# Patient Record
Sex: Male | Born: 1935
Health system: Southern US, Community
[De-identification: ages and names within clinical notes are randomized; demographics above are authoritative.]

## PROBLEM LIST (undated history)

## (undated) DIAGNOSIS — K8020 Calculus of gallbladder without cholecystitis without obstruction: Secondary | ICD-10-CM

## (undated) DIAGNOSIS — E785 Hyperlipidemia, unspecified: Secondary | ICD-10-CM

## (undated) DIAGNOSIS — IMO0002 Reserved for concepts with insufficient information to code with codable children: Secondary | ICD-10-CM

## (undated) DIAGNOSIS — E669 Obesity, unspecified: Secondary | ICD-10-CM

## (undated) DIAGNOSIS — N419 Inflammatory disease of prostate, unspecified: Secondary | ICD-10-CM

## (undated) DIAGNOSIS — E119 Type 2 diabetes mellitus without complications: Secondary | ICD-10-CM

## (undated) HISTORY — DX: Inflammatory disease of prostate, unspecified: N41.9

## (undated) HISTORY — DX: Type 2 diabetes mellitus without complications: E11.9

## (undated) HISTORY — DX: Hyperlipidemia, unspecified: E78.5

## (undated) HISTORY — PX: HERNIA REPAIR: SHX51

## (undated) HISTORY — DX: Reserved for concepts with insufficient information to code with codable children: IMO0002

## (undated) HISTORY — DX: Calculus of gallbladder without cholecystitis without obstruction: K80.20

## (undated) HISTORY — DX: Obesity, unspecified: E66.9

---

## 2000-09-28 ENCOUNTER — Encounter: Payer: Self-pay | Admitting: Family Medicine

## 2000-09-28 ENCOUNTER — Encounter: Admission: RE | Admit: 2000-09-28 | Discharge: 2000-09-28 | Payer: Self-pay | Admitting: Family Medicine

## 2005-01-04 ENCOUNTER — Ambulatory Visit: Payer: Self-pay | Admitting: Family Medicine

## 2005-01-28 ENCOUNTER — Ambulatory Visit: Payer: Self-pay | Admitting: Family Medicine

## 2005-01-31 ENCOUNTER — Encounter: Admission: RE | Admit: 2005-01-31 | Discharge: 2005-01-31 | Payer: Self-pay | Admitting: Family Medicine

## 2006-01-31 ENCOUNTER — Ambulatory Visit: Payer: Self-pay | Admitting: Family Medicine

## 2006-01-31 LAB — CONVERTED CEMR LAB
AST: 24 units/L (ref 0–37)
Albumin: 3.9 g/dL (ref 3.5–5.2)
Alkaline Phosphatase: 68 units/L (ref 39–117)
BUN: 9 mg/dL (ref 6–23)
Basophils Absolute: 0 10*3/uL (ref 0.0–0.1)
CO2: 31 meq/L (ref 19–32)
Calcium: 9.4 mg/dL (ref 8.4–10.5)
Chol/HDL Ratio, serum: 4.5
Cholesterol: 168 mg/dL (ref 0–200)
Creatinine, Ser: 0.9 mg/dL (ref 0.4–1.5)
GFR calc non Af Amer: 89 mL/min
Glomerular Filtration Rate, Af Am: 108 mL/min/{1.73_m2}
HCT: 46.3 % (ref 39.0–52.0)
Hemoglobin: 15.3 g/dL (ref 13.0–17.0)
Lymphocytes Relative: 32.5 % (ref 12.0–46.0)
Monocytes Absolute: 0.5 10*3/uL (ref 0.2–0.7)
Monocytes Relative: 8.2 % (ref 3.0–11.0)
Neutro Abs: 3.6 10*3/uL (ref 1.4–7.7)
RBC: 5.11 M/uL (ref 4.22–5.81)
Total Bilirubin: 1.7 mg/dL — ABNORMAL HIGH (ref 0.3–1.2)
Total Protein: 6.7 g/dL (ref 6.0–8.3)
Triglyceride fasting, serum: 136 mg/dL (ref 0–149)

## 2006-02-07 ENCOUNTER — Ambulatory Visit: Payer: Self-pay | Admitting: Family Medicine

## 2006-04-17 ENCOUNTER — Ambulatory Visit: Payer: Self-pay | Admitting: Family Medicine

## 2006-08-01 ENCOUNTER — Ambulatory Visit: Payer: Self-pay | Admitting: Family Medicine

## 2006-08-01 LAB — CONVERTED CEMR LAB
AST: 25 units/L (ref 0–37)
Bilirubin, Direct: 0.3 mg/dL (ref 0.0–0.3)
Chloride: 109 meq/L (ref 96–112)
Creatinine, Ser: 0.8 mg/dL (ref 0.4–1.5)
Glucose, Bld: 118 mg/dL — ABNORMAL HIGH (ref 70–99)
Sodium: 141 meq/L (ref 135–145)
Total Bilirubin: 1.6 mg/dL — ABNORMAL HIGH (ref 0.3–1.2)

## 2007-02-06 ENCOUNTER — Ambulatory Visit: Payer: Self-pay | Admitting: Family Medicine

## 2007-02-06 LAB — CONVERTED CEMR LAB
ALT: 21 units/L (ref 0–53)
AST: 20 units/L (ref 0–37)
Basophils Relative: 0.2 % (ref 0.0–1.0)
Bilirubin, Direct: 0.3 mg/dL (ref 0.0–0.3)
CO2: 29 meq/L (ref 19–32)
Calcium: 9.4 mg/dL (ref 8.4–10.5)
Chloride: 106 meq/L (ref 96–112)
Creatinine, Ser: 0.9 mg/dL (ref 0.4–1.5)
Eosinophils Absolute: 0.2 10*3/uL (ref 0.0–0.6)
Eosinophils Relative: 2.5 % (ref 0.0–5.0)
GFR calc non Af Amer: 89 mL/min
Glucose, Bld: 118 mg/dL — ABNORMAL HIGH (ref 70–99)
Glucose, Urine, Semiquant: NEGATIVE
HCT: 42.9 % (ref 39.0–52.0)
MCV: 88.7 fL (ref 78.0–100.0)
Neutrophils Relative %: 61.6 % (ref 43.0–77.0)
Nitrite: NEGATIVE
PSA: 4.43 ng/mL — ABNORMAL HIGH (ref 0.10–4.00)
RBC: 4.83 M/uL (ref 4.22–5.81)
Sodium: 140 meq/L (ref 135–145)
Specific Gravity, Urine: 1.015
Total Bilirubin: 1.5 mg/dL — ABNORMAL HIGH (ref 0.3–1.2)
Total CHOL/HDL Ratio: 4.9
Total Protein: 6.2 g/dL (ref 6.0–8.3)
VLDL: 18 mg/dL (ref 0–40)
WBC Urine, dipstick: NEGATIVE
WBC: 7.4 10*3/uL (ref 4.5–10.5)
pH: 8

## 2007-02-15 ENCOUNTER — Ambulatory Visit: Payer: Self-pay | Admitting: Family Medicine

## 2007-02-15 DIAGNOSIS — E785 Hyperlipidemia, unspecified: Secondary | ICD-10-CM | POA: Insufficient documentation

## 2007-02-15 DIAGNOSIS — K7689 Other specified diseases of liver: Secondary | ICD-10-CM

## 2007-02-22 ENCOUNTER — Ambulatory Visit: Payer: Self-pay | Admitting: Cardiology

## 2007-03-01 ENCOUNTER — Telehealth (INDEPENDENT_AMBULATORY_CARE_PROVIDER_SITE_OTHER): Payer: Self-pay | Admitting: *Deleted

## 2007-03-02 ENCOUNTER — Telehealth (INDEPENDENT_AMBULATORY_CARE_PROVIDER_SITE_OTHER): Payer: Self-pay | Admitting: *Deleted

## 2007-05-17 ENCOUNTER — Telehealth: Payer: Self-pay | Admitting: Family Medicine

## 2008-01-07 ENCOUNTER — Telehealth: Payer: Self-pay | Admitting: Family Medicine

## 2008-01-14 ENCOUNTER — Ambulatory Visit: Payer: Self-pay | Admitting: Family Medicine

## 2008-01-14 DIAGNOSIS — Z87448 Personal history of other diseases of urinary system: Secondary | ICD-10-CM | POA: Insufficient documentation

## 2008-01-14 LAB — CONVERTED CEMR LAB
Nitrite: NEGATIVE
Protein, U semiquant: NEGATIVE
Urobilinogen, UA: 0.2

## 2008-02-05 ENCOUNTER — Ambulatory Visit: Payer: Self-pay | Admitting: Family Medicine

## 2008-02-05 LAB — CONVERTED CEMR LAB
ALT: 25 units/L (ref 0–53)
AST: 23 units/L (ref 0–37)
Alkaline Phosphatase: 67 units/L (ref 39–117)
Basophils Absolute: 0 10*3/uL (ref 0.0–0.1)
Bilirubin, Direct: 0.1 mg/dL (ref 0.0–0.3)
Blood in Urine, dipstick: NEGATIVE
CO2: 30 meq/L (ref 19–32)
Chloride: 105 meq/L (ref 96–112)
Cholesterol: 130 mg/dL (ref 0–200)
Eosinophils Absolute: 0.2 10*3/uL (ref 0.0–0.7)
GFR calc non Af Amer: 102 mL/min
Glucose, Urine, Semiquant: NEGATIVE
HDL: 36.2 mg/dL — ABNORMAL LOW (ref >39.0)
LDL Cholesterol: 69 mg/dL (ref 0–99)
Lymphocytes Relative: 29.6 % (ref 12.0–46.0)
MCHC: 35 g/dL (ref 30.0–36.0)
MCV: 89.7 fL (ref 78.0–100.0)
Neutrophils Relative %: 56.9 % (ref 43.0–77.0)
Platelets: 209 10*3/uL (ref 150–400)
Potassium: 4.3 meq/L (ref 3.5–5.1)
Protein, U semiquant: NEGATIVE
RBC: 4.71 M/uL (ref 4.22–5.81)
Total Bilirubin: 1.4 mg/dL — ABNORMAL HIGH (ref 0.3–1.2)
Urobilinogen, UA: 0.2
VLDL: 25 mg/dL (ref 0–40)
WBC Urine, dipstick: NEGATIVE
WBC: 5.4 10*3/uL (ref 4.5–10.5)
pH: 6.5

## 2008-02-19 ENCOUNTER — Ambulatory Visit: Payer: Self-pay | Admitting: Family Medicine

## 2008-02-19 DIAGNOSIS — J309 Allergic rhinitis, unspecified: Secondary | ICD-10-CM | POA: Insufficient documentation

## 2008-02-19 DIAGNOSIS — F528 Other sexual dysfunction not due to a substance or known physiological condition: Secondary | ICD-10-CM

## 2009-02-23 ENCOUNTER — Ambulatory Visit: Payer: Self-pay | Admitting: Family Medicine

## 2009-02-23 LAB — CONVERTED CEMR LAB
BUN: 8 mg/dL (ref 6–23)
Bilirubin Urine: NEGATIVE
Bilirubin, Direct: 0.1 mg/dL (ref 0.0–0.3)
Blood in Urine, dipstick: NEGATIVE
Calcium: 9.1 mg/dL (ref 8.4–10.5)
Chloride: 108 meq/L (ref 96–112)
Cholesterol: 156 mg/dL (ref 0–200)
Creatinine, Ser: 0.7 mg/dL (ref 0.4–1.5)
Eosinophils Absolute: 0.2 10*3/uL (ref 0.0–0.7)
Eosinophils Relative: 2.7 % (ref 0.0–5.0)
HDL: 38.1 mg/dL — ABNORMAL LOW (ref >39.00)
Ketones, urine, test strip: NEGATIVE
LDL Cholesterol: 93 mg/dL (ref 0–99)
Lymphocytes Relative: 30.2 % (ref 12.0–46.0)
MCHC: 35 g/dL (ref 30.0–36.0)
MCV: 91.3 fL (ref 78.0–100.0)
Monocytes Absolute: 0.5 10*3/uL (ref 0.1–1.0)
Neutrophils Relative %: 57.2 % (ref 43.0–77.0)
Nitrite: NEGATIVE
PSA: 1.49 ng/mL (ref 0.10–4.00)
Platelets: 201 10*3/uL (ref 150.0–400.0)
Protein, U semiquant: NEGATIVE
Total Bilirubin: 1.6 mg/dL — ABNORMAL HIGH (ref 0.3–1.2)
Triglycerides: 124 mg/dL (ref 0.0–149.0)
Urobilinogen, UA: 0.2
VLDL: 24.8 mg/dL (ref 0.0–40.0)
WBC: 5.9 10*3/uL (ref 4.5–10.5)

## 2009-03-03 ENCOUNTER — Ambulatory Visit: Payer: Self-pay | Admitting: Family Medicine

## 2010-03-29 ENCOUNTER — Ambulatory Visit
Admission: RE | Admit: 2010-03-29 | Discharge: 2010-03-29 | Payer: Self-pay | Source: Home / Self Care | Attending: Family Medicine | Admitting: Family Medicine

## 2010-03-29 ENCOUNTER — Other Ambulatory Visit: Payer: Self-pay | Admitting: Family Medicine

## 2010-03-29 LAB — CBC WITH DIFFERENTIAL/PLATELET
Basophils Absolute: 0 10*3/uL (ref 0.0–0.1)
Basophils Relative: 0 % (ref 0.0–3.0)
Eosinophils Absolute: 0.2 10*3/uL (ref 0.0–0.7)
Eosinophils Relative: 3 % (ref 0.0–5.0)
HCT: 44.2 % (ref 39.0–52.0)
Hemoglobin: 14.9 g/dL (ref 13.0–17.0)
Lymphocytes Relative: 26 % (ref 12.0–46.0)
Lymphs Abs: 1.8 10*3/uL (ref 0.7–4.0)
MCHC: 33.6 g/dL (ref 30.0–36.0)
MCV: 91.4 fl (ref 78.0–100.0)
Monocytes Absolute: 0.7 10*3/uL (ref 0.1–1.0)
Monocytes Relative: 9.7 % (ref 3.0–12.0)
Neutro Abs: 4.3 10*3/uL (ref 1.4–7.7)
Neutrophils Relative %: 61.3 % (ref 43.0–77.0)
Platelets: 231 10*3/uL (ref 150.0–400.0)
RBC: 4.84 Mil/uL (ref 4.22–5.81)
RDW: 14 % (ref 11.5–14.6)
WBC: 7.1 10*3/uL (ref 4.5–10.5)

## 2010-03-29 LAB — LIPID PANEL
Cholesterol: 157 mg/dL (ref 0–200)
HDL: 38.6 mg/dL — ABNORMAL LOW (ref >39.00)
LDL Cholesterol: 95 mg/dL (ref 0–99)
Total CHOL/HDL Ratio: 4
Triglycerides: 116 mg/dL (ref 0.0–149.0)
VLDL: 23.2 mg/dL (ref 0.0–40.0)

## 2010-03-29 LAB — CONVERTED CEMR LAB
Bilirubin Urine: NEGATIVE
Blood in Urine, dipstick: NEGATIVE
Glucose, Urine, Semiquant: NEGATIVE
Protein, U semiquant: NEGATIVE
Specific Gravity, Urine: >=1.03
WBC Urine, dipstick: NEGATIVE
pH: 5.5

## 2010-03-29 LAB — BASIC METABOLIC PANEL
BUN: 10 mg/dL (ref 6–23)
CO2: 26 mEq/L (ref 19–32)
Calcium: 8.9 mg/dL (ref 8.4–10.5)
Chloride: 102 mEq/L (ref 96–112)
Creatinine, Ser: 0.8 mg/dL (ref 0.4–1.5)
GFR: 95.2 mL/min (ref >60.00)
Glucose, Bld: 133 mg/dL — ABNORMAL HIGH (ref 70–99)
Potassium: 4.1 mEq/L (ref 3.5–5.1)
Sodium: 136 mEq/L (ref 135–145)

## 2010-03-29 LAB — TSH: TSH: 0.47 u[IU]/mL (ref 0.35–5.50)

## 2010-03-29 LAB — HEPATIC FUNCTION PANEL
ALT: 25 U/L (ref 0–53)
AST: 22 U/L (ref 0–37)
Albumin: 3.8 g/dL (ref 3.5–5.2)
Alkaline Phosphatase: 79 U/L (ref 39–117)
Bilirubin, Direct: 0.1 mg/dL (ref 0.0–0.3)
Total Bilirubin: 0.9 mg/dL (ref 0.3–1.2)
Total Protein: 6.3 g/dL (ref 6.0–8.3)

## 2010-03-29 LAB — PSA: PSA: 2.21 ng/mL (ref 0.10–4.00)

## 2010-04-02 ENCOUNTER — Ambulatory Visit
Admission: RE | Admit: 2010-04-02 | Discharge: 2010-04-02 | Payer: Self-pay | Source: Home / Self Care | Attending: Family Medicine | Admitting: Family Medicine

## 2010-04-02 ENCOUNTER — Encounter: Payer: Self-pay | Admitting: Family Medicine

## 2010-04-15 NOTE — Assessment & Plan Note (Signed)
Summary: CPX//SLM   Vital Signs:  Patient profile:   75 year old male Height:      67.5 inches Weight:      254 pounds BMI:     39.34 Temp:     98.6 degrees F oral BP sitting:   110 / 80  (left arm) Cuff size:   regular  Vitals Entered By: Kern Reap CMA Duncan Dull) (April 02, 2010 2:31 PM) CC: wellness exam Is Patient Diabetic? No   CC:  wellness exam.  History of Present Illness: Erik Lara is a 75 year old, married male, who comes in today for a Medicare wellness exam.  Because of underlying hyperlipidemia, allergic rhinitis, and erectile dysfunction.  He takes Zocor 20 mg daily for hyperlipidemia.  Lipids are at goal continue above therapy.  He also takes OTC Zyrtec for allergic rhinitis and vibrant 50 mg p.r.n. for ED, and one adult aspirin tablet daily, and tetanus 2005, seasonal flu 2011, Pneumovax 2007 information given on shingles.  History T9.  Care, dental care, colonoscopy, normal, and GI Here for Medicare AWV:  1.   Risk factors based on Past M, S, F history:.....reviewed no changes 2.   Physical Activities: .Marland Kitchen..walks daily. 3.   Depression/mood: Good mood.  No depression 4.   Hearing: normal 5.   ADL's: functions independently 6.   Fall Risk: reviewed and identified 7.   Home Safety: no guns in the house 8.   Height, weight, &visual acuity:height weight, vision.  No 9.   Counseling: continue good health habits 10.   Labs ordered based on risk factors: reviewed all laboratory data 11.           Referral Coordination...none indicated 12.           Care Plan...Marland KitchenMarland KitchenMarland Kitchenreviewed medications 13.            Cognitive Assessment .Marland Kitchen...oriented x 3 financially independent  Allergies: No Known Drug Allergies  Past History:  Past medical, surgical, family and social histories (including risk factors) reviewed, and no changes noted (except as noted below).  Past Medical History: Reviewed history from 02/19/2008 and no changes required. Hyperlipidemia liver  cysts asymptomatic gallstones obesity prostatitis requiring a trip to the emergency room in [facility] with high fever 09  Family History: Reviewed history from 02/15/2007 and no changes required. Family History Breast cancer 1st degree relative <50 Family History Diabetes 1st degree relative  Social History: Reviewed history from 02/15/2007 and no changes required. Retired Married Never Smoked Alcohol use-no Drug use-no Regular exercise-yes  Review of Systems      See HPI  Physical Exam  General:  Well-developed,well-nourished,in no acute distress; alert,appropriate and cooperative throughout examination Head:  Normocephalic and atraumatic without obvious abnormalities. No apparent alopecia or balding. Eyes:  No corneal or conjunctival inflammation noted. EOMI. Perrla. Funduscopic exam benign, without hemorrhages, exudates or papilledema. Vision grossly normal. Ears:  External ear exam shows no significant lesions or deformities.  Otoscopic examination reveals clear canals, tympanic membranes are intact bilaterally without bulging, retraction, inflammation or discharge. Hearing is grossly normal bilaterally. Nose:  External nasal examination shows no deformity or inflammation. Nasal mucosa are pink and moist without lesions or exudates. Mouth:  Oral mucosa and oropharynx without lesions or exudates.  Teeth in good repair. Neck:  No deformities, masses, or tenderness noted. Breasts:  No masses or gynecomastia noted Lungs:  Normal respiratory effort, chest expands symmetrically. Lungs are clear to auscultation, no crackles or wheezes. Heart:  Normal rate and regular rhythm. S1 and  S2 normal without gallop, murmur, click, rub or other extra sounds. Abdomen:  Bowel sounds positive,abdomen soft and non-tender without masses, organomegaly or hernias noted. Rectal:  No external abnormalities noted. Normal sphincter tone. No rectal masses or tenderness. Genitalia:  Testes bilaterally  descended without nodularity, tenderness or masses. No scrotal masses or lesions. No penis lesions or urethral discharge. Prostate:  Prostate gland firm and smooth, no enlargement, nodularity, tenderness, mass, asymmetry or induration. Msk:  No deformity or scoliosis noted of thoracic or lumbar spine.   Pulses:  R and L carotid,radial,femoral,dorsalis pedis and posterior tibial pulses are full and equal bilaterally Extremities:  No clubbing, cyanosis, edema, or deformity noted with normal full range of motion of all joints.   Neurologic:  No cranial nerve deficits noted. Station and gait are normal. Plantar reflexes are down-going bilaterally. DTRs are symmetrical throughout. Sensory, motor and coordinative functions appear intact. Skin:  Intact without suspicious lesions or rashes Cervical Nodes:  No lymphadenopathy noted Axillary Nodes:  No palpable lymphadenopathy Inguinal Nodes:  No significant adenopathy Psych:  Cognition and judgment appear intact. Alert and cooperative with normal attention span and concentration. No apparent delusions, illusions, hallucinations   Impression & Recommendations:  Problem # 1:  ERECTILE DYSFUNCTION, MILD (ICD-302.72) Assessment Improved  The following medications were removed from the medication list:    Viagra 50 Mg Tabs (Sildenafil citrate) .Marland Kitchen... 1/2 tab as needed His updated medication list for this problem includes:    Viagra 100 Mg Tabs (Sildenafil citrate) ..... Uad  Orders: Prescription Created Electronically [phone]) Medicare -1st Annual Wellness Visit [id number])  Problem # 2:  HYPERLIPIDEMIA (ICD-272.4) Assessment: Improved  His updated medication list for this problem includes:    Zocor 20 Mg Tabs (Simvastatin) .Marland Kitchen... Take 1 tablet by mouth at bedtime; cpx when due  Orders: Prescription Created Electronically [phone]) Medicare -1st Annual Wellness Visit [phone]) EKG w/ Interpretation (93000)  Problem # 3:  HEALTH SCREENING  (ICD-V70.0) Assessment: Unchanged  Orders: Prescription Created Electronically [phone]) Medicare -1st Annual Wellness Visit [phone]) EKG w/ Interpretation (93000)  Problem # 4:  HEPATIC CYST (ICD-573.8) Assessment: Unchanged  Orders: Prescription Created Electronically [phone]) Medicare -1st Annual Wellness Visit [phone])  Complete Medication List: 1)  Zocor 20 Mg Tabs (Simvastatin) .... Take 1 tablet by mouth at bedtime; cpx when due 2)  Zyrtec Allergy 10 Mg Tabs (Cetirizine hcl) .... Once daily at bedtime 3)  Dark Choc With Almonds  4)  Adult Aspirin Ec Low Strength 81 Mg Tbec (Aspirin) .... Once daily 5)  Viagra 100 Mg Tabs (Sildenafil citrate) .... Uad  Patient Instructions: 1)  split the 100 milligram Viagra tablet and take a half a tablet two hours prior to sex with water 2)  Please schedule a follow-up appointment in 1 year. 3)  It is important that you exercise regularly at least 20 minutes 5 times a week. If you develop chest pain, have severe difficulty breathing, or feel very tired , stop exercising immediately and seek medical attention. 4)  You need to lose weight. Consider a lower calorie diet and regular exercise.  5)  Take an Aspirin every day. Prescriptions: ZOCOR 20 MG  TABS (SIMVASTATIN) Take 1 tablet by mouth at bedtime; cpx when due  #100 x 3   Entered and Authorized by:   Roderick Pee MD   Signed by:   Roderick Pee MD on 04/02/2010   Method used:   Electronically to        [facility] (retail)       [address]       [place], [place]  [address]       Ph: [phone]       Fax: [phone]   RxID:   [id number] VIAGRA 100 MG TABS (SILDENAFIL CITRATE) UAD  #6 x 11   Entered and Authorized by:   Roderick Pee MD   Signed by:   Roderick Pee MD on 04/02/2010   Method used:   Electronically to        [facility] [place] (retail)       [address]       [place], [place]  [address]       Ph: [phone]       Fax: [phone]   RxID:   [id number]    Orders Added: 1)  Prescription Created Electronically [phone] 2)  Medicare -1st Annual Wellness Visit [G0438] 3)  EKG w/ Interpretation [93000]   Immunization History:  Influenza Immunization History:    Influenza:  historical (12/12/2009)   Immunization History:  Influenza Immunization History:    Influenza:  Historical (12/12/2009)

## 2011-02-25 ENCOUNTER — Other Ambulatory Visit: Payer: Self-pay

## 2011-02-25 MED ORDER — SIMVASTATIN 20 MG PO TABS: 20.0000 mg | ORAL_TABLET | Freq: Every day | ORAL | Status: DC

## 2011-08-02 ENCOUNTER — Other Ambulatory Visit (INDEPENDENT_AMBULATORY_CARE_PROVIDER_SITE_OTHER): Payer: Medicare Other

## 2011-08-02 DIAGNOSIS — E785 Hyperlipidemia, unspecified: Secondary | ICD-10-CM

## 2011-08-02 DIAGNOSIS — Z125 Encounter for screening for malignant neoplasm of prostate: Secondary | ICD-10-CM

## 2011-08-02 DIAGNOSIS — Z0000 Encounter for general adult medical examination without abnormal findings: Secondary | ICD-10-CM

## 2011-08-02 LAB — LIPID PANEL
HDL: 34.7 mg/dL — ABNORMAL LOW (ref >39.00)
Total CHOL/HDL Ratio: 4

## 2011-08-02 LAB — PSA: PSA: 2.13 ng/mL (ref 0.10–4.00)

## 2011-08-02 LAB — BASIC METABOLIC PANEL
CO2: 28 mEq/L (ref 19–32)
Calcium: 9.2 mg/dL (ref 8.4–10.5)
Creatinine, Ser: 0.7 mg/dL (ref 0.4–1.5)
Sodium: 141 mEq/L (ref 135–145)

## 2011-08-02 LAB — CBC WITH DIFFERENTIAL/PLATELET
Basophils Relative: 0.5 % (ref 0.0–3.0)
Eosinophils Absolute: 0.2 10*3/uL (ref 0.0–0.7)
Eosinophils Relative: 2.3 % (ref 0.0–5.0)
Hemoglobin: 15 g/dL (ref 13.0–17.0)
Lymphocytes Relative: 22.8 % (ref 12.0–46.0)
MCHC: 33.2 g/dL (ref 30.0–36.0)
Monocytes Relative: 9.6 % (ref 3.0–12.0)
Neutro Abs: 4.5 10*3/uL (ref 1.4–7.7)
Neutrophils Relative %: 64.8 % (ref 43.0–77.0)
RBC: 4.99 Mil/uL (ref 4.22–5.81)
WBC: 6.9 10*3/uL (ref 4.5–10.5)

## 2011-08-02 LAB — HEPATIC FUNCTION PANEL
ALT: 21 U/L (ref 0–53)
AST: 19 U/L (ref 0–37)
Albumin: 3.8 g/dL (ref 3.5–5.2)
Alkaline Phosphatase: 65 U/L (ref 39–117)
Bilirubin, Direct: 0.2 mg/dL (ref 0.0–0.3)
Total Protein: 6.4 g/dL (ref 6.0–8.3)

## 2011-08-02 LAB — POCT URINALYSIS DIPSTICK
Blood, UA: NEGATIVE
Ketones, UA: NEGATIVE
Protein, UA: NEGATIVE
Spec Grav, UA: 1.015
Urobilinogen, UA: 0.2

## 2011-08-06 ENCOUNTER — Encounter: Payer: Self-pay | Admitting: Family Medicine

## 2011-08-09 ENCOUNTER — Ambulatory Visit (INDEPENDENT_AMBULATORY_CARE_PROVIDER_SITE_OTHER): Payer: Medicare Other | Admitting: Family Medicine

## 2011-08-09 ENCOUNTER — Encounter: Payer: Self-pay | Admitting: Family Medicine

## 2011-08-09 VITALS — BP 128/88 | Temp 98.4°F | Ht 71.75 in | Wt 252.0 lb

## 2011-08-09 DIAGNOSIS — E785 Hyperlipidemia, unspecified: Secondary | ICD-10-CM

## 2011-08-09 DIAGNOSIS — K7689 Other specified diseases of liver: Secondary | ICD-10-CM

## 2011-08-09 DIAGNOSIS — Z0000 Encounter for general adult medical examination without abnormal findings: Secondary | ICD-10-CM

## 2011-08-09 MED ORDER — SIMVASTATIN 20 MG PO TABS: 20.0000 mg | ORAL_TABLET | Freq: Every day | ORAL | Status: DC

## 2011-08-09 NOTE — Progress Notes (Signed)
  Subjective:    Patient ID: Erik Lara, male    DOB: 1935/05/24, 76 y.o.   MRN: [id number]  HPI Erik Lara is a 76 year old married male nonsmoker who comes in today for a Medicare wellness examination because of a history of allergic rhinitis and hyperlipidemia  He takes over-the-counter Zyrtec for allergic rhinitis  He takes Zocor 20 mg and an aspirin tablet daily for hyperlipidemia lipids are at goal with an LDL of 88  He gets routine eye care, dental care, screening colonoscopies, tetanus 2005, Pneumovax x2, information given on shingles  Cognitive function normal he walks on a regular basis home health safety reviewed no issues identified, no guns in the house, he does have a health care power of attorney and living will   Review of Systems  Constitutional: Negative.   HENT: Negative.   Eyes: Negative.   Respiratory: Negative.   Cardiovascular: Negative.   Gastrointestinal: Negative.   Genitourinary: Negative.   Musculoskeletal: Negative.   Skin: Negative.   Neurological: Negative.   Hematological: Negative.   Psychiatric/Behavioral: Negative.        Objective:   Physical Exam  Constitutional: He is oriented to person, place, and time. He appears well-developed and well-nourished.  HENT:  Head: Normocephalic and atraumatic.  Right Ear: External ear normal.  Left Ear: External ear normal.  Nose: Nose normal.  Mouth/Throat: Oropharynx is clear and moist.  Eyes: Conjunctivae and EOM are normal. Pupils are equal, round, and reactive to light.  Neck: Normal range of motion. Neck supple. No JVD present. No tracheal deviation present. No thyromegaly present.  Cardiovascular: Normal rate, regular rhythm, normal heart sounds and intact distal pulses.  Exam reveals no gallop and no friction rub.   No murmur heard. Pulmonary/Chest: Effort normal and breath sounds normal. No stridor. No respiratory distress. He has no wheezes. He has no rales. He exhibits no tenderness.    Abdominal: Soft. Bowel sounds are normal. He exhibits mass. He exhibits no distension. There is no tenderness. There is no rebound and no guarding.       Cystic lesion persists right lobe of the liver unchanged in size  Genitourinary: Rectum normal, prostate normal and penis normal. Guaiac negative stool. No penile tenderness.  Musculoskeletal: Normal range of motion. He exhibits no edema and no tenderness.  Lymphadenopathy:    He has no cervical adenopathy.  Neurological: He is alert and oriented to person, place, and time. He has normal reflexes. No cranial nerve deficit. He exhibits normal muscle tone.  Skin: Skin is warm and dry. No rash noted. No erythema. No pallor.  Psychiatric: He has a normal mood and affect. His behavior is normal. Judgment and thought content normal.          Assessment & Plan:  Healthy male  Hyperlipidemia continue Zocor and aspirin  Allergic rhinitis continue Zyrtec  Hepatic cysts continued observation

## 2011-08-09 NOTE — Patient Instructions (Signed)
Continue your current medications  Followup in 1 year sooner if any problems 

## 2012-07-31 ENCOUNTER — Other Ambulatory Visit: Payer: Self-pay | Admitting: Family Medicine

## 2012-10-31 ENCOUNTER — Other Ambulatory Visit: Payer: Self-pay | Admitting: Family Medicine

## 2013-01-23 ENCOUNTER — Other Ambulatory Visit (INDEPENDENT_AMBULATORY_CARE_PROVIDER_SITE_OTHER): Payer: Medicare Other

## 2013-01-23 DIAGNOSIS — E119 Type 2 diabetes mellitus without complications: Secondary | ICD-10-CM

## 2013-01-23 DIAGNOSIS — E785 Hyperlipidemia, unspecified: Secondary | ICD-10-CM

## 2013-01-23 DIAGNOSIS — Z0000 Encounter for general adult medical examination without abnormal findings: Secondary | ICD-10-CM

## 2013-01-23 LAB — POCT URINALYSIS DIPSTICK
Leukocytes, UA: NEGATIVE
Nitrite, UA: NEGATIVE
Protein, UA: NEGATIVE
Urobilinogen, UA: 0.2
pH, UA: 6.5

## 2013-01-23 LAB — CBC WITH DIFFERENTIAL/PLATELET
Basophils Relative: 0.3 % (ref 0.0–3.0)
HCT: 43.3 % (ref 39.0–52.0)
Hemoglobin: 14.9 g/dL (ref 13.0–17.0)
Lymphocytes Relative: 24.1 % (ref 12.0–46.0)
Lymphs Abs: 1.6 10*3/uL (ref 0.7–4.0)
Monocytes Relative: 8.1 % (ref 3.0–12.0)
Neutro Abs: 4.3 10*3/uL (ref 1.4–7.7)
RBC: 4.98 Mil/uL (ref 4.22–5.81)
RDW: 13.6 % (ref 11.5–14.6)

## 2013-01-23 LAB — LIPID PANEL
HDL: 39.6 mg/dL (ref >39.00)
LDL Cholesterol: 92 mg/dL (ref 0–99)
Total CHOL/HDL Ratio: 4
VLDL: 25.2 mg/dL (ref 0.0–40.0)

## 2013-01-23 LAB — BASIC METABOLIC PANEL
CO2: 28 mEq/L (ref 19–32)
Calcium: 9.3 mg/dL (ref 8.4–10.5)
GFR: 122.64 mL/min (ref >60.00)
Glucose, Bld: 136 mg/dL — ABNORMAL HIGH (ref 70–99)
Potassium: 4.5 mEq/L (ref 3.5–5.1)
Sodium: 136 mEq/L (ref 135–145)

## 2013-01-23 LAB — TSH: TSH: 0.29 u[IU]/mL — ABNORMAL LOW (ref 0.35–5.50)

## 2013-01-23 LAB — HEPATIC FUNCTION PANEL
ALT: 23 U/L (ref 0–53)
AST: 22 U/L (ref 0–37)
Albumin: 3.9 g/dL (ref 3.5–5.2)
Total Bilirubin: 1.5 mg/dL — ABNORMAL HIGH (ref 0.3–1.2)
Total Protein: 6.7 g/dL (ref 6.0–8.3)

## 2013-01-25 ENCOUNTER — Ambulatory Visit: Payer: Medicare Other

## 2013-01-25 DIAGNOSIS — R7309 Other abnormal glucose: Secondary | ICD-10-CM

## 2013-01-25 LAB — HEMOGLOBIN A1C: Hgb A1c MFr Bld: 7.3 % — ABNORMAL HIGH (ref 4.6–6.5)

## 2013-01-28 ENCOUNTER — Telehealth: Payer: Self-pay | Admitting: Family Medicine

## 2013-01-28 ENCOUNTER — Ambulatory Visit (INDEPENDENT_AMBULATORY_CARE_PROVIDER_SITE_OTHER): Payer: Medicare Other | Admitting: Family Medicine

## 2013-01-28 ENCOUNTER — Encounter: Payer: Self-pay | Admitting: Family Medicine

## 2013-01-28 VITALS — BP 120/80 | Temp 98.2°F | Ht 72.0 in | Wt 250.0 lb

## 2013-01-28 DIAGNOSIS — E119 Type 2 diabetes mellitus without complications: Secondary | ICD-10-CM

## 2013-01-28 DIAGNOSIS — E785 Hyperlipidemia, unspecified: Secondary | ICD-10-CM

## 2013-01-28 DIAGNOSIS — J309 Allergic rhinitis, unspecified: Secondary | ICD-10-CM

## 2013-01-28 DIAGNOSIS — R7309 Other abnormal glucose: Secondary | ICD-10-CM

## 2013-01-28 DIAGNOSIS — F528 Other sexual dysfunction not due to a substance or known physiological condition: Secondary | ICD-10-CM

## 2013-01-28 DIAGNOSIS — Z8639 Personal history of other endocrine, nutritional and metabolic disease: Secondary | ICD-10-CM

## 2013-01-28 DIAGNOSIS — R739 Hyperglycemia, unspecified: Secondary | ICD-10-CM

## 2013-01-28 DIAGNOSIS — Z0000 Encounter for general adult medical examination without abnormal findings: Secondary | ICD-10-CM

## 2013-01-28 DIAGNOSIS — Z862 Personal history of diseases of the blood and blood-forming organs and certain disorders involving the immune mechanism: Secondary | ICD-10-CM

## 2013-01-28 DIAGNOSIS — E0590 Thyrotoxicosis, unspecified without thyrotoxic crisis or storm: Secondary | ICD-10-CM

## 2013-01-28 HISTORY — DX: Thyrotoxicosis, unspecified without thyrotoxic crisis or storm: E05.90

## 2013-01-28 MED ORDER — METFORMIN HCL 500 MG PO TABS: 500.0000 mg | ORAL_TABLET | Freq: Two times a day (BID) | ORAL | Status: DC

## 2013-01-28 MED ORDER — SIMVASTATIN 20 MG PO TABS: ORAL_TABLET | ORAL | Status: DC

## 2013-01-28 NOTE — Telephone Encounter (Signed)
Pt needs a call back, he is confused about how to use his glucose device that was given to him today.

## 2013-01-28 NOTE — Progress Notes (Signed)
Pre visit review using our clinic review tool, if applicable. No additional management support is needed unless otherwise documented below in the visit note. 

## 2013-01-28 NOTE — Patient Instructions (Signed)
Metformin 500 mg tablet....... one half tab every morning before breakfast  Check your blood sugar once a day in the morning  Walk 30 minutes daily the days you do not do your workouts at the [facility]  Diet as we discussed  Followup in one month  Special labs today to check your thyroid status

## 2013-01-28 NOTE — Progress Notes (Signed)
  Subjective:    Patient ID: Erik Lara, male    DOB: [birth year], 77 y.o.   MRN: [id number]  HPI Erik Lara is a 77 year old married male nonsmoker who comes in today for a Medicare wellness examination  He has a history of hyperlipidemia and takes simvastatin 20 mg daily along with an aspirin tablet  His weight is stable at 250 pounds. He's had some elevated blood sugars in the past but his A1c is the normal. Now his fasting sugars 136 A1c 7.3 %......... asymptomatic  He gets routine eye care, dental care, colonoscopy and GI, 6 vaccinations up-to-date except he is due to shingles vaccine  Cognitive function normal he is exercising 4 times daily home health safety reviewed no issues identified, no guns in the house, he does have a health care power of attorney and living well  He's also had borderline hypothyroidism. Recent TSH is 0.29 we'll look into that further  He does have some bilateral high frequency hearing loss  Review of Systems  Constitutional: Negative.   HENT: Negative.   Eyes: Negative.   Respiratory: Negative.   Cardiovascular: Negative.   Gastrointestinal: Negative.   Endocrine: Negative.   Genitourinary: Negative.   Musculoskeletal: Negative.   Skin: Negative.   Allergic/Immunologic: Negative.   Neurological: Negative.   Hematological: Negative.   Psychiatric/Behavioral: Negative.        Objective:   Physical Exam  Nursing note and vitals reviewed. Constitutional: He is oriented to person, place, and time. He appears well-developed and well-nourished.  HENT:  Head: Normocephalic and atraumatic.  Right Ear: External ear normal.  Left Ear: External ear normal.  Nose: Nose normal.  Mouth/Throat: Oropharynx is clear and moist.  Eyes: Conjunctivae and EOM are normal. Pupils are equal, round, and reactive to light.  Neck: Normal range of motion. Neck supple. No JVD present. No tracheal deviation present. No thyromegaly present.  Cardiovascular: Normal  rate, regular rhythm, normal heart sounds and intact distal pulses.  Exam reveals no gallop and no friction rub.   No murmur heard. No carotid nor aortic bruits peripheral pulses 2+ and symmetrical  Pulmonary/Chest: Effort normal and breath sounds normal. No stridor. No respiratory distress. He has no wheezes. He has no rales. He exhibits no tenderness.  Abdominal: Soft. Bowel sounds are normal. He exhibits no distension and no mass. There is no tenderness. There is no rebound and no guarding.  Genitourinary: Rectum normal, prostate normal and penis normal. Guaiac negative stool. No penile tenderness.  Musculoskeletal: Normal range of motion. He exhibits no edema and no tenderness.  No neuropathy  Lymphadenopathy:    He has no cervical adenopathy.  Neurological: He is alert and oriented to person, place, and time. He has normal reflexes. No cranial nerve deficit. He exhibits normal muscle tone.  Skin: Skin is warm and dry. No rash noted. No erythema. No pallor.  Psychiatric: He has a normal mood and affect. His behavior is normal. Judgment and thought content normal.          Assessment & Plan:   healthy male  New-onset diabetes type 2,,,,,,, diet exercise metformin 250 mg daily followup in one month  Hyperlipidemia continue Zocor 20 mg daily and an aspirin tablet  Low TSH check labs

## 2013-01-29 NOTE — Telephone Encounter (Signed)
Attempted to call patient but line was busy.

## 2013-01-29 NOTE — Telephone Encounter (Signed)
Spoke with patient and clarified medication directions.  Patient also states that prodigy test strips would be the cheapest but he wants to talk to his insurance company before getting a Rx.

## 2013-01-31 ENCOUNTER — Telehealth: Payer: Self-pay | Admitting: Family Medicine

## 2013-01-31 NOTE — Telephone Encounter (Signed)
Reading for BS was 22 after breakfast. Advised to recheck in 30 min. Pt states it was 147.  Pt feels like he is not well trained in the use of this machine,  Pt is going to go w/ the free style light or prodigy machine. Pt is to discuss w/ pharm which one is the less expensive strips. Will let you know by end of day.

## 2013-02-01 MED ORDER — FREESTYLE LANCETS MISC: Status: DC

## 2013-02-01 MED ORDER — GLUCOSE BLOOD VI STRP: ORAL_STRIP | Status: DC

## 2013-02-01 NOTE — Telephone Encounter (Signed)
Left message on machine for patient.  Rx sent to [facility] and new glucometer ready for pick up

## 2013-02-01 NOTE — Telephone Encounter (Signed)
Pt needs [facility] to order test trips for FreeStyle light. Please advise, pt called again.

## 2013-02-01 NOTE — Telephone Encounter (Signed)
Pt try to call this afternoon w/ an update on the strips he will use.

## 2013-02-05 ENCOUNTER — Other Ambulatory Visit: Payer: Medicare Other

## 2013-02-12 ENCOUNTER — Encounter: Payer: Medicare Other | Admitting: Family Medicine

## 2013-02-14 ENCOUNTER — Other Ambulatory Visit: Payer: Self-pay | Admitting: *Deleted

## 2013-02-14 ENCOUNTER — Telehealth: Payer: Self-pay | Admitting: Family Medicine

## 2013-02-14 MED ORDER — GLUCOSE BLOOD VI STRP: ORAL_STRIP | Status: DC

## 2013-02-14 NOTE — Telephone Encounter (Signed)
Pt needs to get his glucose blood (FREESTYLE LITE) test strip filled by [facility]. Pt is having issues w/ the pharm getting this approved. [facility] [address]  [facility] is to have faxed [place] a request to fill the test strips.   Pt would to know if we could send a new rx to [facility]. The first one was sent to [facility], but they could not fill for free under pt's insurance. Pt has not been able to check his BS in over a week.

## 2013-02-14 NOTE — Telephone Encounter (Signed)
Sent in to requested pharmacy and informed pt

## 2013-02-18 ENCOUNTER — Telehealth: Payer: Self-pay | Admitting: Family Medicine

## 2013-02-18 NOTE — Telephone Encounter (Signed)
Pt following up on additional info needed for lancets and test strips that needs to be faxed to [facility]/ pharm.

## 2013-02-19 NOTE — Telephone Encounter (Signed)
New information faxed to [facility]

## 2013-02-20 ENCOUNTER — Telehealth: Payer: Self-pay | Admitting: Family Medicine

## 2013-02-20 NOTE — Telephone Encounter (Signed)
Spoke with pharmacy

## 2013-02-20 NOTE — Telephone Encounter (Signed)
Pharm could not read the bar code from the rx for lancets we faxed to them . verbal ok . They will not fill until they get this. [phone] ask for  [facility]

## 2013-03-11 ENCOUNTER — Ambulatory Visit: Payer: Medicare Other | Admitting: Family Medicine

## 2013-03-12 ENCOUNTER — Encounter: Payer: Self-pay | Admitting: Family Medicine

## 2013-03-12 ENCOUNTER — Ambulatory Visit (INDEPENDENT_AMBULATORY_CARE_PROVIDER_SITE_OTHER): Payer: Medicare Other | Admitting: Family Medicine

## 2013-03-12 VITALS — BP 140/84 | Temp 98.3°F | Wt 240.0 lb

## 2013-03-12 DIAGNOSIS — E119 Type 2 diabetes mellitus without complications: Secondary | ICD-10-CM

## 2013-03-12 NOTE — Patient Instructions (Signed)
Continue your current medications metformin......... one half tablet daily in the morning before breakfast  Walk 30 minutes daily  Check a fasting blood sugar Monday Wednesday Friday  Return in 3 months for followup  Labs one week prior

## 2013-03-12 NOTE — Progress Notes (Signed)
   Subjective:    Patient ID: Erik Lara, male    DOB: [birth year], 77 y.o.   MRN: [id number]  HPI Erik Lara is a 77 year old married male nonsmoker who comes in today for follow up of new-onset diabetes type 2  He's had a history of glucose intolerance but less months his A1c went up to 7.3%. We started him on metformin 250 mg prior to breakfast and his blood sugars in the 1 12/13/2018 range fasting  No hypoglycemia as expected.  He has a new granddaughter in [place]   Review of Systems Review of systems negative    Objective:   Physical Exam Well-developed well-nourished male no acute distress vital signs stable he is afebrile       Assessment & Plan:  New-onset diabetes with fairly normal blood sugars on metformin 250 mg daily continue that followup in 3 months

## 2013-06-03 ENCOUNTER — Other Ambulatory Visit (INDEPENDENT_AMBULATORY_CARE_PROVIDER_SITE_OTHER): Payer: Medicare HMO

## 2013-06-03 DIAGNOSIS — E119 Type 2 diabetes mellitus without complications: Secondary | ICD-10-CM

## 2013-06-03 LAB — HEMOGLOBIN A1C: HEMOGLOBIN A1C: 6.9 % — AB (ref 4.6–6.5)

## 2013-06-03 LAB — BASIC METABOLIC PANEL
BUN: 10 mg/dL (ref 6–23)
CALCIUM: 9.3 mg/dL (ref 8.4–10.5)
CO2: 25 meq/L (ref 19–32)
Chloride: 104 mEq/L (ref 96–112)
Creatinine, Ser: 0.7 mg/dL (ref 0.4–1.5)
GFR: 114.59 mL/min (ref >60.00)
GLUCOSE: 128 mg/dL — AB (ref 70–99)
Potassium: 4.1 mEq/L (ref 3.5–5.1)
Sodium: 137 mEq/L (ref 135–145)

## 2013-06-03 LAB — MICROALBUMIN / CREATININE URINE RATIO
Creatinine,U: 72 mg/dL
MICROALB/CREAT RATIO: 0.3 mg/g (ref 0.0–30.0)
Microalb, Ur: 0.2 mg/dL (ref 0.0–1.9)

## 2013-06-10 ENCOUNTER — Ambulatory Visit: Payer: Medicare Other | Admitting: Family Medicine

## 2013-06-17 ENCOUNTER — Ambulatory Visit (INDEPENDENT_AMBULATORY_CARE_PROVIDER_SITE_OTHER): Payer: Medicare HMO | Admitting: Family Medicine

## 2013-06-17 ENCOUNTER — Encounter: Payer: Self-pay | Admitting: Family Medicine

## 2013-06-17 VITALS — BP 110/70 | Temp 98.6°F | Wt 242.0 lb

## 2013-06-17 DIAGNOSIS — E119 Type 2 diabetes mellitus without complications: Secondary | ICD-10-CM

## 2013-06-17 NOTE — Progress Notes (Signed)
Pre visit review using our clinic review tool, if applicable. No additional management support is needed unless otherwise documented below in the visit note. 

## 2013-06-17 NOTE — Progress Notes (Signed)
   Subjective:    Patient ID: CAVON NICOLLS, male    DOB: 07-14-1935, 78 y.o.   MRN: [id number]  HPI Frederica Kuster is a 78 year old married male nonsmoker who comes in today for followup of diabetes type 2 new onset  We started him on metformin 250 mg before breakfast and his blood sugars dropped in the 120 range with an A1c down to 6.9%   Review of Systems No hypoglycemia    Objective:   Physical Exam Well-developed well nourished male no acute distress vital signs stable he is afebrile       Assessment & Plan:  Diabetes type 2 under excellent control continue current therapy followup in November

## 2013-06-17 NOTE — Patient Instructions (Signed)
Continue your current medications  Continue your exercise program  Check a fasting blood sugar weekly  Return in November for your annual Medicare wellness exam.  Labs one week prior

## 2013-09-25 ENCOUNTER — Encounter: Payer: Self-pay | Admitting: Internal Medicine

## 2013-11-03 ENCOUNTER — Other Ambulatory Visit: Payer: Self-pay | Admitting: Family Medicine

## 2014-01-20 ENCOUNTER — Other Ambulatory Visit (INDEPENDENT_AMBULATORY_CARE_PROVIDER_SITE_OTHER): Payer: Medicare HMO

## 2014-01-20 DIAGNOSIS — E119 Type 2 diabetes mellitus without complications: Secondary | ICD-10-CM

## 2014-01-20 LAB — BASIC METABOLIC PANEL
BUN: 11 mg/dL (ref 6–23)
CALCIUM: 9.4 mg/dL (ref 8.4–10.5)
CO2: 30 mEq/L (ref 19–32)
CREATININE: 0.7 mg/dL (ref 0.4–1.5)
Chloride: 103 mEq/L (ref 96–112)
GFR: 118.23 mL/min (ref >60.00)
Glucose, Bld: 118 mg/dL — ABNORMAL HIGH (ref 70–99)
Potassium: 4.9 mEq/L (ref 3.5–5.1)
Sodium: 138 mEq/L (ref 135–145)

## 2014-01-20 LAB — CBC WITH DIFFERENTIAL/PLATELET
BASOS PCT: 0.2 % (ref 0.0–3.0)
Basophils Absolute: 0 10*3/uL (ref 0.0–0.1)
Eosinophils Absolute: 0.2 10*3/uL (ref 0.0–0.7)
Eosinophils Relative: 2.7 % (ref 0.0–5.0)
HCT: 46.1 % (ref 39.0–52.0)
HEMOGLOBIN: 15.2 g/dL (ref 13.0–17.0)
LYMPHS PCT: 22 % (ref 12.0–46.0)
Lymphs Abs: 1.6 10*3/uL (ref 0.7–4.0)
MCHC: 33 g/dL (ref 30.0–36.0)
MCV: 89.5 fl (ref 78.0–100.0)
Monocytes Absolute: 0.5 10*3/uL (ref 0.1–1.0)
Monocytes Relative: 7.6 % (ref 3.0–12.0)
NEUTROS ABS: 4.8 10*3/uL (ref 1.4–7.7)
Neutrophils Relative %: 67.5 % (ref 43.0–77.0)
Platelets: 248 10*3/uL (ref 150.0–400.0)
RBC: 5.15 Mil/uL (ref 4.22–5.81)
RDW: 13.3 % (ref 11.5–15.5)
WBC: 7.1 10*3/uL (ref 4.0–10.5)

## 2014-01-20 LAB — LIPID PANEL
CHOL/HDL RATIO: 4
Cholesterol: 146 mg/dL (ref 0–200)
HDL: 33.3 mg/dL — ABNORMAL LOW (ref >39.00)
LDL Cholesterol: 88 mg/dL (ref 0–99)
NonHDL: 112.7
TRIGLYCERIDES: 124 mg/dL (ref 0.0–149.0)
VLDL: 24.8 mg/dL (ref 0.0–40.0)

## 2014-01-20 LAB — HEPATIC FUNCTION PANEL
ALBUMIN: 3.3 g/dL — AB (ref 3.5–5.2)
ALK PHOS: 65 U/L (ref 39–117)
ALT: 18 U/L (ref 0–53)
AST: 16 U/L (ref 0–37)
BILIRUBIN DIRECT: 0.2 mg/dL (ref 0.0–0.3)
Total Bilirubin: 1.2 mg/dL (ref 0.2–1.2)
Total Protein: 6.3 g/dL (ref 6.0–8.3)

## 2014-01-20 LAB — POCT URINALYSIS DIPSTICK
Bilirubin, UA: NEGATIVE
Blood, UA: NEGATIVE
GLUCOSE UA: NEGATIVE
KETONES UA: NEGATIVE
Leukocytes, UA: NEGATIVE
Nitrite, UA: NEGATIVE
Protein, UA: NEGATIVE
SPEC GRAV UA: 1.015
Urobilinogen, UA: 0.2
pH, UA: 7

## 2014-01-20 LAB — MICROALBUMIN / CREATININE URINE RATIO
Creatinine,U: 68.4 mg/dL
MICROALB UR: 0.2 mg/dL (ref 0.0–1.9)
MICROALB/CREAT RATIO: 0.3 mg/g (ref 0.0–30.0)

## 2014-01-20 LAB — PSA: PSA: 2.86 ng/mL (ref 0.10–4.00)

## 2014-01-20 LAB — TSH: TSH: 0.22 u[IU]/mL — AB (ref 0.35–4.50)

## 2014-01-20 LAB — HEMOGLOBIN A1C: HEMOGLOBIN A1C: 6.6 % — AB (ref 4.6–6.5)

## 2014-01-27 ENCOUNTER — Encounter: Payer: Self-pay | Admitting: Family Medicine

## 2014-01-27 ENCOUNTER — Telehealth: Payer: Self-pay | Admitting: Family Medicine

## 2014-01-27 ENCOUNTER — Ambulatory Visit (INDEPENDENT_AMBULATORY_CARE_PROVIDER_SITE_OTHER): Payer: Medicare HMO | Admitting: Family Medicine

## 2014-01-27 VITALS — BP 140/80 | Temp 98.2°F | Ht 72.5 in | Wt 235.0 lb

## 2014-01-27 DIAGNOSIS — J309 Allergic rhinitis, unspecified: Secondary | ICD-10-CM

## 2014-01-27 DIAGNOSIS — Z8639 Personal history of other endocrine, nutritional and metabolic disease: Secondary | ICD-10-CM

## 2014-01-27 DIAGNOSIS — F528 Other sexual dysfunction not due to a substance or known physiological condition: Secondary | ICD-10-CM

## 2014-01-27 DIAGNOSIS — Z23 Encounter for immunization: Secondary | ICD-10-CM

## 2014-01-27 DIAGNOSIS — E785 Hyperlipidemia, unspecified: Secondary | ICD-10-CM

## 2014-01-27 DIAGNOSIS — E119 Type 2 diabetes mellitus without complications: Secondary | ICD-10-CM

## 2014-01-27 MED ORDER — SIMVASTATIN 20 MG PO TABS: ORAL_TABLET | ORAL | Status: DC

## 2014-01-27 MED ORDER — METFORMIN HCL 500 MG PO TABS: ORAL_TABLET | ORAL | Status: DC

## 2014-01-27 NOTE — Progress Notes (Signed)
Pre visit review using our clinic review tool, if applicable. No additional management support is needed unless otherwise documented below in the visit note. Lab Results  Component Value Date   HGBA1C 6.6* 01/20/2014   HGBA1C 6.9* 06/03/2013   HGBA1C 7.3* 01/25/2013   Lab Results  Component Value Date   MICROALBUR 0.2 01/20/2014   LDLCALC 88 01/20/2014   CREATININE 0.7 01/20/2014

## 2014-01-27 NOTE — Telephone Encounter (Signed)
[place] called to clarify the dosage of the metformin    Mickel Baas [phone]

## 2014-01-27 NOTE — Patient Instructions (Signed)
Continue your current medications  Follow-up in 6 months for your diabetes  Nonfasting labs one week prior

## 2014-01-27 NOTE — Telephone Encounter (Signed)
Patient was seen in the office today and a new prescription was sent

## 2014-01-27 NOTE — Progress Notes (Signed)
   Subjective:    Patient ID: Erik Lara, male    DOB: 02/08/1936, 78 y.o.   MRN: [id number]  HPI Erik Lara is a 78 year old married male nonsmoker who comes in today for evaluation of hyperlipidemia and diabetes type 2  He takes metformin 250 mg daily in the morning exercises daily hemoglobin A1c 7 days ago was 6.6% which represents excellent control  BP 140/80  He is on Zocor 20 mg daily and an aspirin tablet LDL is 88  He gets routine eye care, dental care, colonoscopy and GI all normal, vaccinations updated by [name]  Cognitive function normal he walks on a regular basis home health safety reviewed no issues identified, no guns in the house, he does have a healthcare power of attorney and living well  His TSH level is  Low,,,,,,,,,, it's been this way in the past T4 and T3 and exam have been normal.   Review of Systems  Constitutional: Negative.   HENT: Negative.   Eyes: Negative.   Respiratory: Negative.   Cardiovascular: Negative.   Gastrointestinal: Negative.   Endocrine: Negative.   Genitourinary: Negative.   Musculoskeletal: Negative.   Skin: Negative.   Allergic/Immunologic: Negative.   Neurological: Negative.   Hematological: Negative.   Psychiatric/Behavioral: Negative.        Objective:   Physical Exam  Constitutional: He is oriented to person, place, and time. He appears well-developed and well-nourished.  HENT:  Head: Normocephalic and atraumatic.  Right Ear: External ear normal.  Left Ear: External ear normal.  Nose: Nose normal.  Mouth/Throat: Oropharynx is clear and moist.  Eyes: Conjunctivae and EOM are normal. Pupils are equal, round, and reactive to light.  Neck: Normal range of motion. Neck supple. No JVD present. No tracheal deviation present. No thyromegaly present.  Cardiovascular: Normal rate, regular rhythm, normal heart sounds and intact distal pulses.  Exam reveals no gallop and no friction rub.   No murmur heard. No carotid nor aortic  bruits peripheral pulses 2+ and symmetrical  Pulmonary/Chest: Effort normal and breath sounds normal. No stridor. No respiratory distress. He has no wheezes. He has no rales. He exhibits no tenderness.  Abdominal: Soft. Bowel sounds are normal. He exhibits no distension and no mass. There is no tenderness. There is no rebound and no guarding.  Genitourinary: Rectum normal, prostate normal and penis normal. Guaiac negative stool. No penile tenderness.  2+ symmetrical BPH......  Musculoskeletal: Normal range of motion. He exhibits no edema or tenderness.  No neuropathy  Lymphadenopathy:    He has no cervical adenopathy.  Neurological: He is alert and oriented to person, place, and time. He has normal reflexes. No cranial nerve deficit. He exhibits normal muscle tone.  Skin: Skin is warm and dry. No rash noted. No erythema. No pallor.  Total body skin exam normal  Psychiatric: He has a normal mood and affect. His behavior is normal. Judgment and thought content normal.  Nursing note and vitals reviewed.         Assessment & Plan:  Diabetes type 2 at goal..... Continue current therapy  Hyperlipidemia at goal..... Continue current therapy  BPH,,, nocturia 2,,,,,,,, PSA normal  Low TSH level...... Normal thyroid exam........ T4 and T3 normal

## 2014-06-15 ENCOUNTER — Encounter: Payer: Self-pay | Admitting: Internal Medicine

## 2014-07-21 ENCOUNTER — Other Ambulatory Visit (INDEPENDENT_AMBULATORY_CARE_PROVIDER_SITE_OTHER): Payer: Medicare HMO

## 2014-07-21 DIAGNOSIS — E119 Type 2 diabetes mellitus without complications: Secondary | ICD-10-CM

## 2014-07-21 LAB — BASIC METABOLIC PANEL
BUN: 12 mg/dL (ref 6–23)
CO2: 26 mEq/L (ref 19–32)
CREATININE: 0.77 mg/dL (ref 0.40–1.50)
Calcium: 9.3 mg/dL (ref 8.4–10.5)
Chloride: 104 mEq/L (ref 96–112)
GFR: 104.03 mL/min (ref >60.00)
GLUCOSE: 126 mg/dL — AB (ref 70–99)
POTASSIUM: 4.2 meq/L (ref 3.5–5.1)
SODIUM: 136 meq/L (ref 135–145)

## 2014-07-21 LAB — HEMOGLOBIN A1C: Hgb A1c MFr Bld: 6.6 % — ABNORMAL HIGH (ref 4.6–6.5)

## 2014-07-28 ENCOUNTER — Ambulatory Visit: Payer: Medicare HMO | Admitting: Family Medicine

## 2014-08-05 ENCOUNTER — Ambulatory Visit (INDEPENDENT_AMBULATORY_CARE_PROVIDER_SITE_OTHER): Payer: Medicare HMO | Admitting: Family Medicine

## 2014-08-05 VITALS — BP 130/80 | Temp 98.7°F | Wt 234.0 lb

## 2014-08-05 DIAGNOSIS — E119 Type 2 diabetes mellitus without complications: Secondary | ICD-10-CM | POA: Diagnosis not present

## 2014-08-05 NOTE — Progress Notes (Signed)
Pre visit review using our clinic review tool, if applicable. No additional management support is needed unless otherwise documented below in the visit note. 

## 2014-08-05 NOTE — Progress Notes (Signed)
   Subjective:    Patient ID: Erik Lara, male    DOB: 1935-08-13, 79 y.o.   MRN: [id number]  HPI Erik Lara is a 79 year old male who comes in today for follow-up of new onset diabetes  His blood sugars in the 120 range. A1c 6 months ago was 6.6% now at 6.6% also. He takes metformin 250 mg in the morning and walks daily  He says he otherwise feels well except he has a concerned about his wife. She's undergone breast cancer treatment with tamoxifen and has had no interest in sex since she's been on that medication. Advised him to talk to her doctor   Review of Systems    review of systems otherwise negative Objective:   Physical Exam Well-developed well-nourished male no acute distress vital signs stable he is afebrile BP normal       Assessment & Plan:  Diabetes type 2 at goal.......... continue current therapy follow-up in 6 months CPX.

## 2014-08-05 NOTE — Patient Instructions (Signed)
Return the first week in November for your annual physical examination  Erik Lara,,,,,,,,,, our new adult nurse practitioner  Fasting labs one week prior to physical  Continue current medication  Since your blood sugars normal.......... continue to check it once weekly

## 2014-12-12 LAB — HM COLONOSCOPY

## 2014-12-17 ENCOUNTER — Other Ambulatory Visit: Payer: Self-pay | Admitting: Family Medicine

## 2015-01-27 ENCOUNTER — Other Ambulatory Visit (INDEPENDENT_AMBULATORY_CARE_PROVIDER_SITE_OTHER): Payer: Medicare HMO

## 2015-01-27 DIAGNOSIS — E119 Type 2 diabetes mellitus without complications: Secondary | ICD-10-CM

## 2015-01-27 LAB — BASIC METABOLIC PANEL
BUN: 10 mg/dL (ref 6–23)
CHLORIDE: 104 meq/L (ref 96–112)
CO2: 29 meq/L (ref 19–32)
Calcium: 9.7 mg/dL (ref 8.4–10.5)
Creatinine, Ser: 0.75 mg/dL (ref 0.40–1.50)
GFR: 107.1 mL/min (ref >60.00)
GLUCOSE: 131 mg/dL — AB (ref 70–99)
Potassium: 4.8 mEq/L (ref 3.5–5.1)
Sodium: 140 mEq/L (ref 135–145)

## 2015-01-27 LAB — CBC WITH DIFFERENTIAL/PLATELET
BASOS ABS: 0 10*3/uL (ref 0.0–0.1)
Basophils Relative: 0.4 % (ref 0.0–3.0)
Eosinophils Absolute: 0.2 10*3/uL (ref 0.0–0.7)
Eosinophils Relative: 2.4 % (ref 0.0–5.0)
HCT: 46.2 % (ref 39.0–52.0)
Hemoglobin: 15.2 g/dL (ref 13.0–17.0)
LYMPHS ABS: 1.4 10*3/uL (ref 0.7–4.0)
Lymphocytes Relative: 21.3 % (ref 12.0–46.0)
MCHC: 33 g/dL (ref 30.0–36.0)
MCV: 89.7 fl (ref 78.0–100.0)
MONO ABS: 0.5 10*3/uL (ref 0.1–1.0)
Monocytes Relative: 7.7 % (ref 3.0–12.0)
NEUTROS PCT: 68.2 % (ref 43.0–77.0)
Neutro Abs: 4.4 10*3/uL (ref 1.4–7.7)
Platelets: 241 10*3/uL (ref 150.0–400.0)
RBC: 5.15 Mil/uL (ref 4.22–5.81)
RDW: 13.8 % (ref 11.5–15.5)
WBC: 6.5 10*3/uL (ref 4.0–10.5)

## 2015-01-27 LAB — MICROALBUMIN / CREATININE URINE RATIO
Creatinine,U: 95.9 mg/dL
Microalb Creat Ratio: 0.7 mg/g (ref 0.0–30.0)
Microalb, Ur: <0.7 mg/dL (ref 0.0–1.9)

## 2015-01-27 LAB — POCT URINALYSIS DIPSTICK
Bilirubin, UA: NEGATIVE
Blood, UA: NEGATIVE
Glucose, UA: NEGATIVE
KETONES UA: NEGATIVE
LEUKOCYTES UA: NEGATIVE
NITRITE UA: NEGATIVE
PH UA: 7
Protein, UA: NEGATIVE
Spec Grav, UA: 1.02
Urobilinogen, UA: 0.2

## 2015-01-27 LAB — HEPATIC FUNCTION PANEL
ALT: 19 U/L (ref 0–53)
AST: 17 U/L (ref 0–37)
Albumin: 4.1 g/dL (ref 3.5–5.2)
Alkaline Phosphatase: 74 U/L (ref 39–117)
Bilirubin, Direct: 0.2 mg/dL (ref 0.0–0.3)
Total Bilirubin: 1.1 mg/dL (ref 0.2–1.2)
Total Protein: 6.4 g/dL (ref 6.0–8.3)

## 2015-01-27 LAB — PSA: PSA: 3.23 ng/mL (ref 0.10–4.00)

## 2015-01-27 LAB — HEMOGLOBIN A1C: HEMOGLOBIN A1C: 6.5 % (ref 4.6–6.5)

## 2015-01-27 LAB — LIPID PANEL
CHOLESTEROL: 130 mg/dL (ref 0–200)
HDL: 42.2 mg/dL (ref >39.00)
LDL Cholesterol: 64 mg/dL (ref 0–99)
NONHDL: 87.48
Total CHOL/HDL Ratio: 3
Triglycerides: 116 mg/dL (ref 0.0–149.0)
VLDL: 23.2 mg/dL (ref 0.0–40.0)

## 2015-01-27 LAB — TSH: TSH: 0.34 u[IU]/mL — AB (ref 0.35–4.50)

## 2015-01-28 ENCOUNTER — Other Ambulatory Visit (INDEPENDENT_AMBULATORY_CARE_PROVIDER_SITE_OTHER): Payer: Medicare HMO

## 2015-01-28 DIAGNOSIS — E039 Hypothyroidism, unspecified: Secondary | ICD-10-CM | POA: Diagnosis not present

## 2015-01-28 DIAGNOSIS — I519 Heart disease, unspecified: Principal | ICD-10-CM

## 2015-01-28 LAB — T3, FREE: T3, Free: 2.9 pg/mL (ref 2.3–4.2)

## 2015-01-28 LAB — T4, FREE: Free T4: 0.88 ng/dL (ref 0.60–1.60)

## 2015-01-29 LAB — HM DIABETES EYE EXAM

## 2015-02-03 ENCOUNTER — Encounter: Payer: Self-pay | Admitting: Family Medicine

## 2015-02-03 ENCOUNTER — Telehealth: Payer: Self-pay | Admitting: Family Medicine

## 2015-02-03 ENCOUNTER — Ambulatory Visit (INDEPENDENT_AMBULATORY_CARE_PROVIDER_SITE_OTHER): Payer: Medicare HMO | Admitting: Family Medicine

## 2015-02-03 VITALS — BP 120/80 | Temp 98.4°F | Ht 71.5 in | Wt 223.0 lb

## 2015-02-03 DIAGNOSIS — Z8639 Personal history of other endocrine, nutritional and metabolic disease: Secondary | ICD-10-CM

## 2015-02-03 DIAGNOSIS — Z0000 Encounter for general adult medical examination without abnormal findings: Secondary | ICD-10-CM

## 2015-02-03 DIAGNOSIS — E785 Hyperlipidemia, unspecified: Secondary | ICD-10-CM | POA: Diagnosis not present

## 2015-02-03 DIAGNOSIS — R413 Other amnesia: Secondary | ICD-10-CM

## 2015-02-03 DIAGNOSIS — E119 Type 2 diabetes mellitus without complications: Secondary | ICD-10-CM

## 2015-02-03 DIAGNOSIS — F528 Other sexual dysfunction not due to a substance or known physiological condition: Secondary | ICD-10-CM | POA: Diagnosis not present

## 2015-02-03 MED ORDER — SIMVASTATIN 20 MG PO TABS: ORAL_TABLET | ORAL | Status: DC

## 2015-02-03 MED ORDER — METFORMIN HCL 500 MG PO TABS: ORAL_TABLET | ORAL | Status: DC

## 2015-02-03 NOTE — Progress Notes (Signed)
Pre visit review using our clinic review tool, if applicable. No additional management support is needed unless otherwise documented below in the visit note. 

## 2015-02-03 NOTE — Telephone Encounter (Signed)
Mr. Harwell would like you to call him today if possible. He wouldn't say why.   Pt ph# [phone] Thank you.

## 2015-02-03 NOTE — Patient Instructions (Signed)
Continue current medications  We will set you up a consult in neurology to evaluate your memory  Return in one year for general physical examination sooner if any problems,,,,,,, when you: July future physical examination in November,,,,,,, Tommi Rumps or Mady Haagensen new nurse practitioners or Dr. Martinique

## 2015-02-03 NOTE — Progress Notes (Signed)
Subjective:    Patient ID: Erik Lara, male    DOB: 10-14-1935, 79 y.o.   MRN: [id number]  HPI Erik Lara is a 79 year old married male nonsmoker retired who comes in today for general physical examination because of a history of hyperlipidemia , mild diabetes on 259 milligrams of metformin daily an aspirin  Overall he says he feels well except his major concern is having decrease in memory. He says his wife attempted to do something and he has difficulty remembering what to do. Long-term memory is intact.   He gets routine eye care, dental care, colonoscopy 2005 was normal he is due for follow-up. I asked him to call GI  Always vaccinations are up-to-date except he is due a shingles vaccine. Asked him to call his insurance company find out where he can get it done the cheapest. Seasonal flu shot October 2016   Weight last year was 234 he's down to 223 with daily exercise area   He had a screening eye exam by his optometrist no evidence of any diabetic retinopathy   Cognitive function normal except she's having some difficulty with short-term memory loss. Will refer to neurology   he does exercise on a daily basis, home health safety reviewed no issues identified, no guns in the house, he does have a healthcare power of attorney and living well.   Review of Systems  Constitutional: Negative.   HENT: Negative.   Eyes: Negative.   Respiratory: Negative.   Cardiovascular: Negative.   Gastrointestinal: Negative.   Endocrine: Negative.   Genitourinary: Negative.   Musculoskeletal: Negative.   Skin: Negative.   Allergic/Immunologic: Negative.   Neurological: Negative.   Hematological: Negative.   Psychiatric/Behavioral: Negative.        Objective:   Physical Exam  Constitutional: He is oriented to person, place, and time. He appears well-developed and well-nourished.  HENT:  Head: Normocephalic and atraumatic.  Right Ear: External ear normal.  Left Ear: External ear normal.   Nose: Nose normal.  Mouth/Throat: Oropharynx is clear and moist.  Bilateral cataracts  Eyes: Conjunctivae and EOM are normal. Pupils are equal, round, and reactive to light.  Neck: Normal range of motion. Neck supple. No JVD present. No tracheal deviation present. No thyromegaly present.  Cardiovascular: Normal rate, regular rhythm, normal heart sounds and intact distal pulses.  Exam reveals no gallop and no friction rub.   No murmur heard. No carotid neurologic bruits peripheral pulses 2+ and symmetrical  Pulmonary/Chest: Effort normal and breath sounds normal. No stridor. No respiratory distress. He has no wheezes. He has no rales. He exhibits no tenderness.  Abdominal: Soft. Bowel sounds are normal. He exhibits no distension and no mass. There is no tenderness. There is no rebound and no guarding.  Hepatic cysts as previously palpated no change  Genitourinary: Rectum normal and penis normal. Guaiac negative stool. No penile tenderness.  1+ symmetrical nonnodular BPH  Musculoskeletal: Normal range of motion. He exhibits no edema or tenderness.  Lymphadenopathy:    He has no cervical adenopathy.  Neurological: He is alert and oriented to person, place, and time. He has normal reflexes. No cranial nerve deficit. He exhibits normal muscle tone.  Skin: Skin is warm and dry. No rash noted. No erythema. No pallor.  Psychiatric: He has a normal mood and affect. His behavior is normal. Judgment and thought content normal.  Nursing note and vitals reviewed.         Assessment & Plan:  h healthy male  Diabetes type 2 excellent control with diet exercise weight loss and 250 mg of metformin daily  Hyperlipidemia goal continue aspirin and Zocor  New problem with short-term memory loss refer to neurology for further evaluation Low TSH level with normal T4 and T3 observe normal thyroid exam  BPH relatively asymptomatic no further therapy at this time  Overweight......Marland Kitchen 11 pound weight loss  with diet exercise continue the above.

## 2015-02-04 NOTE — Telephone Encounter (Signed)
Patient is requesting a prescription for Viagra. Okay to fill?

## 2015-02-04 NOTE — Telephone Encounter (Signed)
Left message on machine returning patient's call 

## 2015-02-09 MED ORDER — SILDENAFIL CITRATE 100 MG PO TABS: 50.0000 mg | ORAL_TABLET | Freq: Every day | ORAL | Status: DC | PRN

## 2015-02-09 NOTE — Telephone Encounter (Signed)
Patient is aware 

## 2015-02-09 NOTE — Telephone Encounter (Signed)
Rx ready. Tried to call the patient but the line is busy.

## 2015-02-13 ENCOUNTER — Encounter: Payer: Self-pay | Admitting: Family Medicine

## 2015-03-20 ENCOUNTER — Ambulatory Visit: Payer: Medicare HMO | Admitting: Neurology

## 2015-03-30 ENCOUNTER — Ambulatory Visit (INDEPENDENT_AMBULATORY_CARE_PROVIDER_SITE_OTHER): Payer: PPO | Admitting: Neurology

## 2015-03-30 ENCOUNTER — Encounter: Payer: Self-pay | Admitting: Neurology

## 2015-03-30 ENCOUNTER — Other Ambulatory Visit (INDEPENDENT_AMBULATORY_CARE_PROVIDER_SITE_OTHER): Payer: PPO

## 2015-03-30 VITALS — BP 132/74 | HR 93 | Resp 16 | Wt 230.0 lb

## 2015-03-30 DIAGNOSIS — E119 Type 2 diabetes mellitus without complications: Secondary | ICD-10-CM

## 2015-03-30 DIAGNOSIS — R413 Other amnesia: Secondary | ICD-10-CM | POA: Diagnosis not present

## 2015-03-30 DIAGNOSIS — E785 Hyperlipidemia, unspecified: Secondary | ICD-10-CM | POA: Diagnosis not present

## 2015-03-30 HISTORY — DX: Type 2 diabetes mellitus without complications: E11.9

## 2015-03-30 LAB — VITAMIN B12: Vitamin B-12: 263 pg/mL (ref 211–911)

## 2015-03-30 NOTE — Progress Notes (Signed)
NEUROLOGY CONSULTATION NOTE  Erik Lara MRN: [id number] DOB: [birth year]  Referring provider: Dr. Macon Large Primary care provider: Dr. Macon Large  Reason for consult:  Memory loss  Dear Dr Sherren Mocha:  Thank you for your kind referral of Erik Lara for consultation of the above symptoms. Although his history is well known to you, please allow me to reiterate it for the purpose of our medical record. Records and images were personally reviewed where available.  HISTORY OF PRESENT ILLNESS: This is a pleasant 80 year old right-handed man with a history of hyperlipidemia, diabetes, presenting for evaluation of memory loss. He started noticing changes around 1-1/2 years ago, he would notice his memory would be "spotty," with a blank spot occurring once in a while. He reported this to his PCP, then again on his next follow-up visit last 80 November 2016. He was concerned about an episode while [place] the interstate 2 months ago to a familiar place, when he could not recall which exit to take. He missed the exit and had to turn around. He has noticed moments while driving where he briefly has a blank spot ("new garden or spring garden") then quickly passes. He has been told by his wife that he repeats himself. He has always had word-finding difficulties, but noticed this has worsened. He infrequently misses bill payments, stating he has a record system, but would miss paying if he gets very busy. He denies any missed medications. He denies any family history of dementia, history of head injury or alcohol use.  He denies any headaches, dizziness, diplopia, dysarthria, dysphagia, neck/back pain, focal numbness/tingling/weakness, bowel/bladder dysfunction, tremors. He has had poor sense of smell for many years. He has noticed some difficulty reading from afar, better if he looks closer. He has occasional cramping in the calf muscles, left>right, worse at night. He denies any falls. He goes to  exercise class 3-4 times a week.   Laboratory Data: Lab Results  Component Value Date   WBC 6.5 01/27/2015   HGB 15.2 01/27/2015   HCT 46.2 01/27/2015   MCV 89.7 01/27/2015   PLT 241.0 01/27/2015     Chemistry      Component Value Date/Time   NA 140 01/27/2015 0810   K 4.8 01/27/2015 0810   CL 104 01/27/2015 0810   CO2 29 01/27/2015 0810   BUN 10 01/27/2015 0810   CREATININE 0.75 01/27/2015 0810      Component Value Date/Time   CALCIUM 9.7 01/27/2015 0810   ALKPHOS 74 01/27/2015 0810   AST 17 01/27/2015 0810   ALT 19 01/27/2015 0810   BILITOT 1.1 01/27/2015 0810     Lab Results  Component Value Date   TSH 0.34* 01/27/2015   Lab Results  Component Value Date   HGBA1C 6.5 01/27/2015   Lab Results  Component Value Date   CHOL 130 01/27/2015   HDL 42.20 01/27/2015   LDLCALC 64 01/27/2015   TRIG 116.0 01/27/2015   CHOLHDL 3 01/27/2015     PAST MEDICAL HISTORY: Past Medical History  Diagnosis Date  . Hyperlipidemia   . Cysts     liver  . Gallstones     asymptomatic  . Obesity   . Prostatitis     requiring a trip to the ER in [place] with a fever - 09    PAST SURGICAL HISTORY: Past Surgical History  Procedure Laterality Date  . Hernia repair      Childhood    MEDICATIONS: Current  Outpatient Prescriptions on File Prior to Visit  Medication Sig Dispense Refill  . aspirin 81 MG tablet Take 81 mg by mouth daily.    . cetirizine (ZYRTEC) 10 MG tablet Take 10 mg by mouth daily.    . metFORMIN (GLUCOPHAGE) 500 MG tablet Half tab every morning 100 tablet 2  . sildenafil (VIAGRA) 100 MG tablet Take 0.5-1 tablets (50-100 mg total) by mouth daily as needed for erectile dysfunction. 10 tablet 10  . simvastatin (ZOCOR) 20 MG tablet TAKE 1 TABLET (20 MG TOTAL) BY MOUTH AT BEDTIME. 90 tablet 4   No current facility-administered medications on file prior to visit.    ALLERGIES: No Known Allergies  FAMILY HISTORY: Family History  Problem Relation Age of  Onset  . Cancer Other     breast  . Diabetes Other     SOCIAL HISTORY: Social History   Social History  . Marital Status: Married    Spouse Name: N/A  . Number of Children: 3  . Years of Education: N/A   Occupational History  . Retired    Social History Main Topics  . Smoking status: Never Smoker   . Smokeless tobacco: Never Used  . Alcohol Use: 0.0 oz/week    0 Standard drinks or equivalent per week     Comment: Beer weekly/glass of wine daily  . Drug Use: No  . Sexual Activity: Not on file   Other Topics Concern  . Not on file   Social History Narrative    REVIEW OF SYSTEMS: Constitutional: No fevers, chills, or sweats, no generalized fatigue, change in appetite Eyes: No visual changes, double vision, eye pain Ear, nose and throat: No hearing loss, ear pain, nasal congestion, sore throat Cardiovascular: No chest pain, palpitations Respiratory:  No shortness of breath at rest or with exertion, wheezes GastrointestinaI: No nausea, vomiting, diarrhea, abdominal pain, fecal incontinence Genitourinary:  No dysuria, urinary retention or frequency Musculoskeletal:  No neck pain, back pain Integumentary: No rash, pruritus, skin lesions Neurological: as above Psychiatric: No depression, insomnia, anxiety Endocrine: No palpitations, fatigue, diaphoresis, mood swings, change in appetite, change in weight, increased thirst Hematologic/Lymphatic:  No anemia, purpura, petechiae. Allergic/Immunologic: no itchy/runny eyes, nasal congestion, recent allergic reactions, rashes  PHYSICAL EXAM: Filed Vitals:   03/30/15 0959  BP: 132/74  Pulse: 93  Resp: 16   General: No acute distress Head:  Normocephalic/atraumatic Eyes: Fundoscopic exam shows bilateral sharp discs, no vessel changes, exudates, or hemorrhages Neck: supple, no paraspinal tenderness, full range of motion Back: No paraspinal tenderness Heart: regular rate and rhythm Lungs: Clear to auscultation  bilaterally. Vascular: No carotid bruits. Skin/Extremities: No rash, no edema Neurological Exam: Mental status: alert and oriented to person, place, and time, no dysarthria or aphasia, Fund of knowledge is appropriate.  Recent and remote memory are intact.  Attention and concentration are normal.    Able to name objects and repeat phrases. CDT 5/5 MMSE - Mini Mental State Exam 03/30/2015  Orientation to time 5  Orientation to Place 5  Registration 3  Attention/ Calculation 4  Recall 2  Language- name 2 objects 2  Language- repeat 1  Language- follow 3 step command 3  Language- read & follow direction 1  Write a sentence 1  Copy design 1  Total score 28   Cranial nerves: CN I: not tested CN II: pupils equal, round and reactive to light, visual fields intact, fundi unremarkable. CN III, IV, VI:  full range of motion, no nystagmus, no ptosis  CN V: facial sensation intact CN VII: upper and lower face symmetric CN VIII: hearing intact to finger rub CN IX, X: gag intact, uvula midline CN XI: sternocleidomastoid and trapezius muscles intact CN XII: tongue midline Bulk & Tone: normal, no fasciculations. Motor: 5/5 throughout with no pronator drift. Sensation: decreased vibration to knees bilaterally, decreased pin on right calf, otherwise intact to light touch, cold, and joint position sense.  No extinction to double simultaneous stimulation.  Romberg test negative Deep Tendon Reflexes: +2 throughout except for absent ankle jerks bilaterally, no ankle clonus Plantar responses: downgoing bilaterally Cerebellar: no incoordination on finger to nose, heel to shin. No dysdiadochokinesia Gait: narrow-based and steady, able to tandem walk adequately. Tremor: none  IMPRESSION: This is a pleasant 80 year old right-handed man with a vascular risk factors including diabetes, hyperlipidemia, presenting for memory loss. 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 MMSE today normal 28/30. We discussed different causes of memory loss, including age-related memory changes. Check B12. MRI brain without contrast will be ordered to assess for underlying structural abnormality and assess vascular load. There is no clear indication to start cholinesterase inhibitors such as Aricept at this time. e discussed the importance of control of vascular risk factors, physical exercise, and brain stimulation exercises for brain health. He will follow-up in 8 months.   Thank you for allowing me to participate in the care of this patient. Please do not hesitate to call for any questions or concerns.   Erik Lara, M.D.  CC: Dr. Sherren Mocha

## 2015-03-30 NOTE — Patient Instructions (Signed)
1. Bloodwork for B12 2. Schedule MRI brain without contrast 3. Control of sugars, cholesterol, as well as physical exercise and brain stimulation exercises are important for brain health 4. Follow-up in 8 months

## 2015-03-31 ENCOUNTER — Telehealth: Payer: Self-pay | Admitting: Family Medicine

## 2015-03-31 NOTE — Telephone Encounter (Signed)
Patient was notified of result. 

## 2015-03-31 NOTE — Telephone Encounter (Signed)
-----   Message from Cameron Sprang, MD sent at 03/31/2015  9:46 AM EST ----- Pls let him know B12 level is normal, thanks

## 2015-04-14 ENCOUNTER — Ambulatory Visit (HOSPITAL_COMMUNITY)
Admission: RE | Admit: 2015-04-14 | Discharge: 2015-04-14 | Disposition: A | Discharge status: Home / Self Care | Payer: PPO | Source: Ambulatory Visit | Attending: Neurology | Admitting: Neurology

## 2015-04-14 DIAGNOSIS — R413 Other amnesia: Secondary | ICD-10-CM | POA: Insufficient documentation

## 2015-04-14 DIAGNOSIS — I998 Other disorder of circulatory system: Secondary | ICD-10-CM | POA: Insufficient documentation

## 2015-04-14 DIAGNOSIS — G319 Degenerative disease of nervous system, unspecified: Secondary | ICD-10-CM | POA: Diagnosis not present

## 2015-04-22 ENCOUNTER — Telehealth: Payer: Self-pay | Admitting: Family Medicine

## 2015-04-22 NOTE — Telephone Encounter (Signed)
Patient was notified of MRI results.

## 2015-04-22 NOTE — Telephone Encounter (Signed)
-----   Message from Cameron Sprang, MD sent at 04/21/2015  1:01 PM EST ----- Pls let him know I reviewed MRI brain, it is unremarkable, no evidence of tumor, stroke, or bleed. It shows age-related changes.Thanks

## 2015-05-18 ENCOUNTER — Telehealth: Payer: Self-pay | Admitting: Family Medicine

## 2015-05-18 MED ORDER — GLUCOSE BLOOD VI STRP: ORAL_STRIP | Status: DC

## 2015-05-18 NOTE — Telephone Encounter (Signed)
Pt needs refill on his test strips.  glucose blood (FREESTYLE LITE) test strip   [facility] [address]  Pt has not had this filled in a long time. (2014) Pt has only 3 strips left, and pt concerned he needs extra time to have this filled. Please call pt.

## 2015-05-19 ENCOUNTER — Other Ambulatory Visit: Payer: Self-pay | Admitting: Family Medicine

## 2015-07-10 ENCOUNTER — Ambulatory Visit (INDEPENDENT_AMBULATORY_CARE_PROVIDER_SITE_OTHER): Payer: PPO | Admitting: Adult Health

## 2015-07-10 ENCOUNTER — Encounter: Payer: Self-pay | Admitting: Adult Health

## 2015-07-10 VITALS — BP 142/78 | Temp 97.8°F | Wt 230.1 lb

## 2015-07-10 DIAGNOSIS — M545 Low back pain, unspecified: Secondary | ICD-10-CM

## 2015-07-10 NOTE — Progress Notes (Signed)
Subjective:    Patient ID: Erik Lara, male    DOB: [birth year], 80 y.o.   MRN: [id number]  HPI  80 year old male who presents to the office for " stiffness, two or three days after mowing yard". This stiffness is in his lower back, more so on the right side. He has a Chiropractor, that is not a self propelled mower.   Today he has no pain or stiffness.   Review of Systems  Respiratory: Negative.   Cardiovascular: Negative.   Genitourinary: Negative.   Musculoskeletal: Positive for myalgias and back pain.   Past Medical History  Diagnosis Date  . Hyperlipidemia   . Cysts     liver  . Gallstones     asymptomatic  . Obesity   . Prostatitis     requiring a trip to the ER in [place] with a fever - 09  . Diabetes mellitus [facility])     Social History   Social History  . Marital Status: Married    Spouse Name: N/A  . Number of Children: 3  . Years of Education: N/A   Occupational History  . Retired    Social History Main Topics  . Smoking status: Never Smoker   . Smokeless tobacco: Never Used  . Alcohol Use: 0.0 oz/week    0 Standard drinks or equivalent per week     Comment: Beer weekly/glass of wine daily  . Drug Use: No  . Sexual Activity: Not on file   Other Topics Concern  . Not on file   Social History Narrative    Past Surgical History  Procedure Laterality Date  . Hernia repair      Childhood    Family History  Problem Relation Age of Onset  . Cancer Other     breast  . Diabetes Other     No Known Allergies  Current Outpatient Prescriptions on File Prior to Visit  Medication Sig Dispense Refill  . aspirin 81 MG tablet Take 81 mg by mouth daily.    . cetirizine (ZYRTEC) 10 MG tablet Take 10 mg by mouth daily.    Marland Kitchen glucose blood (FREESTYLE LITE) test strip Use once daily Dx E11.9 100 each 12  . Lancets (FREESTYLE) lancets USE ONCE DAILY, DX 250.OO 100 each 11  . metFORMIN (GLUCOPHAGE) 500 MG tablet Half tab every morning 100 tablet 2    . simvastatin (ZOCOR) 20 MG tablet TAKE 1 TABLET (20 MG TOTAL) BY MOUTH AT BEDTIME. 90 tablet 4  . sildenafil (VIAGRA) 100 MG tablet Take 0.5-1 tablets (50-100 mg total) by mouth daily as needed for erectile dysfunction. (Patient not taking: Reported on 07/10/2015) 10 tablet 10   No current facility-administered medications on file prior to visit.    BP 142/78 mmHg  Temp(Src) 97.8 F (36.6 C) (Oral)  Wt 230 lb 1.6 oz (104.373 kg)       Objective:   Physical Exam  Constitutional: He is oriented to person, place, and time. He appears well-developed and well-nourished. No distress.  Musculoskeletal: Normal range of motion. He exhibits no edema or tenderness.  Neurological: He is alert and oriented to person, place, and time.  Skin: Skin is warm and dry. No rash noted. He is not diaphoretic. No erythema. No pallor.  No bruising noted. No redness, warmth, or crepitus.   Psychiatric: He has a normal mood and affect. His behavior is normal. Judgment and thought content normal.  Nursing note and vitals reviewed.  Assessment & Plan:  1. Bilateral low back pain without sciatica - Likely muscle strain - Advised stretching exercises before mowing the yard, ibuprofen, and back strengthening exercises.  - Can also think about getting a self propelled mower.  - Follow up if no improvement.   Dorothyann Peng, NP

## 2015-07-28 ENCOUNTER — Ambulatory Visit (INDEPENDENT_AMBULATORY_CARE_PROVIDER_SITE_OTHER): Payer: PPO | Admitting: Adult Health

## 2015-07-28 VITALS — BP 148/80 | Temp 98.9°F | Ht 71.5 in

## 2015-07-28 DIAGNOSIS — M545 Low back pain, unspecified: Secondary | ICD-10-CM

## 2015-07-28 NOTE — Patient Instructions (Addendum)
It was great seeing you again today!  I think you discomfort is from the exercise you are getting from the push mower.   Get your self propelled mower working and see if that helps  Take the pain medication as needed  Follow up as needed

## 2015-07-28 NOTE — Progress Notes (Signed)
Subjective:    Patient ID: Erik Lara, male    DOB: 12-Sep-1935, 80 y.o.   MRN: [id number]  HPI  80 year old male who presents to the office today for follow up regarding back pain. He reports that today he " feels fine today".   He last mowed his yard on Saturday, when he woke up Sunday his back and legs were sore. Yesterday he continued to be sore but the pain started to go away throughout the day. He has been taking Ibuprofen as needed and is stretching.   He continues to use a push mower   Review of Systems  Musculoskeletal: Positive for back pain and arthralgias. Negative for myalgias, joint swelling and gait problem.  Skin: Negative.   All other systems reviewed and are negative.  Past Medical History  Diagnosis Date  . Hyperlipidemia   . Cysts     liver  . Gallstones     asymptomatic  . Obesity   . Prostatitis     requiring a trip to the ER in [place] with a fever - 09  . Diabetes mellitus [facility])     Social History   Social History  . Marital Status: Married    Spouse Name: N/A  . Number of Children: 3  . Years of Education: N/A   Occupational History  . Retired    Social History Main Topics  . Smoking status: Never Smoker   . Smokeless tobacco: Never Used  . Alcohol Use: 0.0 oz/week    0 Standard drinks or equivalent per week     Comment: Beer weekly/glass of wine daily  . Drug Use: No  . Sexual Activity: Not on file   Other Topics Concern  . Not on file   Social History Narrative    Past Surgical History  Procedure Laterality Date  . Hernia repair      Childhood    Family History  Problem Relation Age of Onset  . Cancer Other     breast  . Diabetes Other     No Known Allergies  Current Outpatient Prescriptions on File Prior to Visit  Medication Sig Dispense Refill  . aspirin 81 MG tablet Take 81 mg by mouth daily.    . cetirizine (ZYRTEC) 10 MG tablet Take 10 mg by mouth daily.    Marland Kitchen glucose blood (FREESTYLE LITE) test strip  Use once daily Dx E11.9 100 each 12  . Lancets (FREESTYLE) lancets USE ONCE DAILY, DX 250.OO 100 each 11  . metFORMIN (GLUCOPHAGE) 500 MG tablet Half tab every morning 100 tablet 2  . sildenafil (VIAGRA) 100 MG tablet Take 0.5-1 tablets (50-100 mg total) by mouth daily as needed for erectile dysfunction. 10 tablet 10  . simvastatin (ZOCOR) 20 MG tablet TAKE 1 TABLET (20 MG TOTAL) BY MOUTH AT BEDTIME. 90 tablet 4   No current facility-administered medications on file prior to visit.    BP 148/80 mmHg  Temp(Src) 98.9 F (37.2 C) (Oral)  Ht 5' 11.5" (1.816 m)       Objective:   Physical Exam  Constitutional: He is oriented to person, place, and time. He appears well-developed and well-nourished. No distress.  Cardiovascular: Normal rate, regular rhythm, normal heart sounds and intact distal pulses.  Exam reveals no gallop and no friction rub.   No murmur heard. Pulmonary/Chest: Effort normal and breath sounds normal. No respiratory distress. He has no wheezes. He has no rales. He exhibits no tenderness.  Neurological: He  is alert and oriented to person, place, and time.  Skin: Skin is warm and dry. No rash noted. He is not diaphoretic. No erythema. No pallor.  Psychiatric: He has a normal mood and affect. His behavior is normal.  Vitals reviewed.      Assessment & Plan:  1. Bilateral low back pain without sciatica - I believe that the pain is coming from him using a push mower.  - Continue to use Motrin as needed and stretch before mowing - Advised to try a family members self propelled mower  - Follow up as needed  Dorothyann Peng, NP

## 2015-08-13 ENCOUNTER — Encounter: Payer: Self-pay | Admitting: Family Medicine

## 2015-12-02 ENCOUNTER — Ambulatory Visit: Payer: PPO | Admitting: Neurology

## 2015-12-18 ENCOUNTER — Ambulatory Visit: Payer: PPO | Admitting: Neurology

## 2016-03-03 DIAGNOSIS — H5203 Hypermetropia, bilateral: Secondary | ICD-10-CM | POA: Diagnosis not present

## 2016-03-10 ENCOUNTER — Encounter: Payer: Self-pay | Admitting: Neurology

## 2016-03-10 ENCOUNTER — Ambulatory Visit (INDEPENDENT_AMBULATORY_CARE_PROVIDER_SITE_OTHER): Payer: PPO | Admitting: Neurology

## 2016-03-10 VITALS — BP 136/78 | HR 86 | Ht 71.5 in | Wt 233.2 lb

## 2016-03-10 DIAGNOSIS — R413 Other amnesia: Secondary | ICD-10-CM

## 2016-03-10 NOTE — Progress Notes (Signed)
NEUROLOGY FOLLOW UP OFFICE NOTE  Erik Lara [id number]  HISTORY OF PRESENT ILLNESS: I had the pleasure of seeing Erik Lara in follow-up in the neurology clinic on 03/10/2016.  The patient was last seen almost a year ago for worsening memory. His MMSE in January 2017 was 28/30. I personally reviewed MRI brain without contrast done 04/14/15 which did not show any acute changes. There was moderate cerebral atrophy and minimal chronic microvascular disease. B12 level was low normal, 263. Since his last visit, he reports that things are mostly unchanged, he feels he is more sensitive to memory changes.Last night something told him to check his appointment for today, he thought he would be going to [facility] where he had the MRI, and could not remember [address], even if this is a familiar road to him. He could not say what county it was. When he got to our office, he finally remembered being here before. He gets lost frequently while driving, around 1-2 times a week, more at night. He would be able to complete his trip, but there is a small piece between start to finish that he would miss. He misplaces things frequently. No missed bills or medications. He exercises regularly. He fell one time since his alst visit, he was walking down hill and kept speeding up until he fell. He denies any headaches, dizziness, diplopia, dysarthria, dysphagia, neck/back pain, focal numbness/tingling/weakness, bowel/bladder dysfunction, tremors.   HPI 03/30/15: This is a pleasant 80 yo RH man with a history of hyperlipidemia, diabetes, who presented for memory loss. He started noticing changes around 1-1/2 years ago, he would notice his memory would be "spotty," with a blank spot occurring once in a while. He reported this to his PCP, then again on his next follow-up visit last November 2016. He was concerned about an episode [place] 2 [place] a familiar place, when he could not recall  which exit to take. He missed the exit and had to turn around. He has noticed moments while driving where he briefly has a blank spot ("new garden or spring garden") then quickly passes. He has been told by his wife that he repeats himself. He has always had word-finding difficulties, but noticed this has worsened. He infrequently misses bill payments, stating he has a record system, but would miss paying if he gets very busy. He denies any missed medications. He denies any family history of dementia, history of head injury or alcohol use.  He has had poor sense of smell for many years. He has noticed some difficulty reading from afar, better if he looks closer. He has occasional cramping in the calf muscles, left>right, worse at night. He denies any falls. He goes to exercise class 3-4 times a week.   PAST MEDICAL HISTORY: Past Medical History:  Diagnosis Date  . Cysts    liver  . Diabetes mellitus ([place])   . Gallstones    asymptomatic  . Hyperlipidemia   . Obesity   . Prostatitis    requiring a trip to the ER in [place] with a fever - 09    MEDICATIONS: Current Outpatient Prescriptions on File Prior to Visit  Medication Sig Dispense Refill  . aspirin 81 MG tablet Take 81 mg by mouth daily.    . cetirizine (ZYRTEC) 10 MG tablet Take 10 mg by mouth daily.    Marland Kitchen glucose blood (FREESTYLE LITE) test strip Use once daily Dx E11.9 100 each 12  .  Lancets (FREESTYLE) lancets USE ONCE DAILY, DX 250.OO 100 each 11  . metFORMIN (GLUCOPHAGE) 500 MG tablet Half tab every morning 100 tablet 2  . simvastatin (ZOCOR) 20 MG tablet TAKE 1 TABLET (20 MG TOTAL) BY MOUTH AT BEDTIME. 90 tablet 4  . sildenafil (VIAGRA) 100 MG tablet Take 0.5-1 tablets (50-100 mg total) by mouth daily as needed for erectile dysfunction. (Patient not taking: Reported on 03/10/2016) 10 tablet 10   No current facility-administered medications on file prior to visit.     ALLERGIES: No Known Allergies  FAMILY  HISTORY: Family History  Problem Relation Age of Onset  . Cancer Other     breast  . Diabetes Other     SOCIAL HISTORY: Social History   Social History  . Marital status: Married    Spouse name: N/A  . Number of children: 3  . Years of education: N/A   Occupational History  . Retired    Social History Main Topics  . Smoking status: Never Smoker  . Smokeless tobacco: Never Used  . Alcohol use 0.0 oz/week     Comment: Beer weekly/glass of wine daily  . Drug use: No  . Sexual activity: Not on file   Other Topics Concern  . Not on file   Social History Narrative  . No narrative on file    REVIEW OF SYSTEMS: Constitutional: No fevers, chills, or sweats, no generalized fatigue, change in appetite Eyes: No visual changes, double vision, eye pain Ear, nose and throat: No hearing loss, ear pain, nasal congestion, sore throat Cardiovascular: No chest pain, palpitations Respiratory:  No shortness of breath at rest or with exertion, wheezes GastrointestinaI: No nausea, vomiting, diarrhea, abdominal pain, fecal incontinence Genitourinary:  No dysuria, urinary retention or frequency Musculoskeletal:  No neck pain, back pain Integumentary: No rash, pruritus, skin lesions Neurological: as above Psychiatric: No depression, insomnia, anxiety Endocrine: No palpitations, fatigue, diaphoresis, mood swings, change in appetite, change in weight, increased thirst Hematologic/Lymphatic:  No anemia, purpura, petechiae. Allergic/Immunologic: no itchy/runny eyes, nasal congestion, recent allergic reactions, rashes  PHYSICAL EXAM: Vitals:   03/10/16 0825  BP: 136/78  Pulse: 86   General: No acute distress Head:  Normocephalic/atraumatic Neck: supple, no paraspinal tenderness, full range of motion Heart:  Regular rate and rhythm Lungs:  Clear to auscultation bilaterally Back: No paraspinal tenderness Skin/Extremities: No rash, no edema Neurological Exam: alert and oriented to  person, place, and time. No aphasia or dysarthria. Fund of knowledge is appropriate.  Recent and remote memory are intact. 3/3 delayed recall.  Attention and concentration are normal.    Able to name objects and repeat phrases. Cranial nerves: Pupils equal, round, reactive to light.  Extraocular movements intact with no nystagmus. Visual fields full. Facial sensation intact. No facial asymmetry. Tongue, uvula, palate midline.  Motor: Bulk and tone normal, muscle strength 5/5 throughout with no pronator drift.  Sensation to light touch intact.  No extinction to double simultaneous stimulation.  Deep tendon reflexes 2+ throughout, toes downgoing.  Finger to nose testing intact.  Gait narrow-based and steady, able to tandem walk adequately.  Romberg negative.  IMPRESSION: This is a pleasant 80 yo RH man with a vascular risk factors including diabetes, hyperlipidemia, who presented last January 2017 for memory loss. MMSE at that time was 28/30. He reports memory is unchanged, but that he gets lost driving 1-2 times a week. He was advised to do a driving evaluation, which he is hesitant about. He will be referred for  Neurocognitive testing to further evaluate memory, in light of driving problems. Otherwise it appears he is functioning independently pretty well. We again discussed the importance of control of vascular risk factors, physical exercise, and brain stimulation exercises for brain health. He will follow-up in 6 months.   Thank you for allowing me to participate in his care.  Please do not hesitate to call for any questions or concerns.  The duration of this appointment visit was 15 minutes of face-to-face time with the patient.  Greater than 50% of this time was spent in counseling, explanation of diagnosis, planning of further management, and coordination of care.   Ellouise Newer, M.D.   CC: Dorothyann Peng, NP

## 2016-03-10 NOTE — Patient Instructions (Signed)
Schedule Neurocognitive testing with Dr. Si Raider. Control of blood pressure, cholesterol, and diabetes, as well as physical exercise and brain stimulation exercises are important for brain health.  You have been referred for a neurocognitive evaluation in our office.   The evaluation consists of three appointments.   1. The first appointment is about 45 minutes and is a clinical interview with the neuropsychologist (Dr. Macarthur Critchley). Please bring someone with you to this appointment if possible, as it is helpful for Dr. Si Raider to hear from both you and another adult who knows you well.   2. The second appointment is 2-3 hours long and is with the psychometrician Milana Kidney). You will complete a variety of tasks- mostly question-and-answer, some paper-and-pencil. There is nothing you need to do to prepare for this appointment, but having a good night's sleep prior to the testing, and bringing eyeglasses and hearing aids (if you wear them), is advised.   3. The final appointment is a follow-up with Dr. Si Raider where she will go over the test results with you and provide recommendations and a plan of care. This appointment is about 30 minutes.  If you would like a family member to receive this information as well, please bring them to the appointment.   We have to reserve several hours of the neuropsychologist's time and the psychometrician's time for your appointment. As such, please note that there is a No-Show fee of $100. If you are unable to attend any of your appointments, please contact our office as soon as possible to reschedule.

## 2016-03-18 ENCOUNTER — Encounter: Payer: Self-pay | Admitting: Neurology

## 2016-03-28 ENCOUNTER — Other Ambulatory Visit (INDEPENDENT_AMBULATORY_CARE_PROVIDER_SITE_OTHER): Payer: PPO

## 2016-03-28 DIAGNOSIS — Z0000 Encounter for general adult medical examination without abnormal findings: Secondary | ICD-10-CM

## 2016-03-28 LAB — HEMOGLOBIN A1C: HEMOGLOBIN A1C: 6.7 % — AB (ref 4.6–6.5)

## 2016-03-28 LAB — MICROALBUMIN / CREATININE URINE RATIO
CREATININE, U: 86 mg/dL
Microalb Creat Ratio: 0.8 mg/g (ref 0.0–30.0)

## 2016-03-28 LAB — BASIC METABOLIC PANEL
BUN: 10 mg/dL (ref 6–23)
CHLORIDE: 105 meq/L (ref 96–112)
CO2: 29 mEq/L (ref 19–32)
CREATININE: 0.71 mg/dL (ref 0.40–1.50)
Calcium: 9.5 mg/dL (ref 8.4–10.5)
GFR: 113.75 mL/min (ref >60.00)
Glucose, Bld: 137 mg/dL — ABNORMAL HIGH (ref 70–99)
Potassium: 4.5 mEq/L (ref 3.5–5.1)
Sodium: 139 mEq/L (ref 135–145)

## 2016-03-28 LAB — LIPID PANEL
CHOLESTEROL: 129 mg/dL (ref 0–200)
HDL: 42.1 mg/dL (ref >39.00)
LDL Cholesterol: 69 mg/dL (ref 0–99)
NonHDL: 86.69
TRIGLYCERIDES: 89 mg/dL (ref 0.0–149.0)
Total CHOL/HDL Ratio: 3
VLDL: 17.8 mg/dL (ref 0.0–40.0)

## 2016-03-28 LAB — CBC WITH DIFFERENTIAL/PLATELET
BASOS PCT: 0.4 % (ref 0.0–3.0)
Basophils Absolute: 0 10*3/uL (ref 0.0–0.1)
EOS ABS: 0.2 10*3/uL (ref 0.0–0.7)
EOS PCT: 2.9 % (ref 0.0–5.0)
HEMATOCRIT: 43.3 % (ref 39.0–52.0)
Hemoglobin: 14.8 g/dL (ref 13.0–17.0)
Lymphocytes Relative: 21.7 % (ref 12.0–46.0)
Lymphs Abs: 1.3 10*3/uL (ref 0.7–4.0)
MCHC: 34.2 g/dL (ref 30.0–36.0)
MCV: 87.9 fl (ref 78.0–100.0)
MONO ABS: 0.5 10*3/uL (ref 0.1–1.0)
Monocytes Relative: 7.7 % (ref 3.0–12.0)
NEUTROS ABS: 4.1 10*3/uL (ref 1.4–7.7)
Neutrophils Relative %: 67.3 % (ref 43.0–77.0)
PLATELETS: 231 10*3/uL (ref 150.0–400.0)
RBC: 4.92 Mil/uL (ref 4.22–5.81)
RDW: 13.7 % (ref 11.5–15.5)
WBC: 6.1 10*3/uL (ref 4.0–10.5)

## 2016-03-28 LAB — PSA: PSA: 3.83 ng/mL (ref 0.10–4.00)

## 2016-03-28 LAB — TSH: TSH: 0.23 u[IU]/mL — ABNORMAL LOW (ref 0.35–4.50)

## 2016-03-28 LAB — HEPATIC FUNCTION PANEL
ALBUMIN: 4 g/dL (ref 3.5–5.2)
ALT: 17 U/L (ref 0–53)
AST: 15 U/L (ref 0–37)
Alkaline Phosphatase: 76 U/L (ref 39–117)
Bilirubin, Direct: 0.2 mg/dL (ref 0.0–0.3)
Total Bilirubin: 1.1 mg/dL (ref 0.2–1.2)
Total Protein: 6.3 g/dL (ref 6.0–8.3)

## 2016-03-28 LAB — POC URINALSYSI DIPSTICK (AUTOMATED)
Bilirubin, UA: NEGATIVE
Blood, UA: NEGATIVE
GLUCOSE UA: NEGATIVE
Ketones, UA: NEGATIVE
Leukocytes, UA: NEGATIVE
NITRITE UA: NEGATIVE
Protein, UA: NEGATIVE
Spec Grav, UA: 1.02
UROBILINOGEN UA: 0.2
pH, UA: 6

## 2016-03-31 ENCOUNTER — Encounter: Payer: PPO | Admitting: Adult Health

## 2016-04-05 ENCOUNTER — Encounter: Payer: Self-pay | Admitting: Adult Health

## 2016-04-05 ENCOUNTER — Ambulatory Visit (INDEPENDENT_AMBULATORY_CARE_PROVIDER_SITE_OTHER): Payer: PPO | Admitting: Adult Health

## 2016-04-05 VITALS — BP 124/72 | Temp 98.2°F | Ht 71.5 in | Wt 233.6 lb

## 2016-04-05 DIAGNOSIS — Z0000 Encounter for general adult medical examination without abnormal findings: Secondary | ICD-10-CM | POA: Diagnosis not present

## 2016-04-05 DIAGNOSIS — R946 Abnormal results of thyroid function studies: Secondary | ICD-10-CM

## 2016-04-05 DIAGNOSIS — R413 Other amnesia: Secondary | ICD-10-CM

## 2016-04-05 DIAGNOSIS — F528 Other sexual dysfunction not due to a substance or known physiological condition: Secondary | ICD-10-CM

## 2016-04-05 DIAGNOSIS — Z8639 Personal history of other endocrine, nutritional and metabolic disease: Secondary | ICD-10-CM | POA: Diagnosis not present

## 2016-04-05 DIAGNOSIS — E119 Type 2 diabetes mellitus without complications: Secondary | ICD-10-CM | POA: Diagnosis not present

## 2016-04-05 DIAGNOSIS — E785 Hyperlipidemia, unspecified: Secondary | ICD-10-CM | POA: Diagnosis not present

## 2016-04-05 LAB — T4, FREE: Free T4: 0.87 ng/dL (ref 0.60–1.60)

## 2016-04-05 LAB — T3, FREE: T3 FREE: 3.4 pg/mL (ref 2.3–4.2)

## 2016-04-05 MED ORDER — SILDENAFIL CITRATE 20 MG PO TABS: ORAL_TABLET | ORAL | 6 refills | Status: DC

## 2016-04-05 NOTE — Patient Instructions (Signed)
It was great seeing you today!   Your blood work looked good. Thyroid level was a little low so I am going to recheck this.   Follow up with Neurology as directed.   I would like to see you back in 6 months for a diabetes check.

## 2016-04-05 NOTE — Progress Notes (Signed)
Subjective:    Patient ID: TREY SANT, male    DOB: August 19, 1935, 81 y.o.   MRN: [id number]  HPI  Patient presents for yearly preventative medicine examination. He is a pleasant 81 year old male who  has a past medical history of Cysts; Diabetes mellitus ([place]); Gallstones; Hyperlipidemia; Obesity; and Prostatitis.  All immunizations and health maintenance protocols were reviewed with the patient and needed orders were placed.  Appropriate screening laboratory values were ordered for the patient including screening of hyperlipidemia, renal function and hepatic function.  Medication reconciliation,  past medical history, social history, problem list and allergies were reviewed in detail with the patient  Goals were established with regard to weight loss, exercise, and  diet in compliance with medications  End of life planning was discussed.  He has had his diabetic eye exam done. He has seen his dentist   He is followed by Dr.Aquino cognitive impairment area and he was last seen at that office on 03/10/2016. His biggest issue with cognitive impairment, which she door endorses today is that of getting lost driving 1-2 times per week. He reports "when I'm driving I just give lost for a moment or two." The follow-up exam pending for neurocognitive testing. He'll be seen back in neurology in 6 months. Otherwise he is independent of his IADLs and ADLs.  Review of Systems  Constitutional: Negative.   HENT: Negative.   Eyes: Negative.   Respiratory: Negative.   Cardiovascular: Negative.   Gastrointestinal: Negative.   Endocrine: Negative.   Genitourinary: Negative.   Musculoskeletal: Negative.   Skin: Negative.   Allergic/Immunologic: Negative.   Neurological: Negative.   Hematological: Negative.   Psychiatric/Behavioral: Positive for confusion.   Past Medical History:  Diagnosis Date  . Cysts    liver  . Diabetes mellitus ([place])   . Gallstones    asymptomatic  .  Hyperlipidemia   . Obesity   . Prostatitis    requiring a trip to the ER in [place] with a fever - 09    Social History   Social History  . Marital status: Married    Spouse name: N/A  . Number of children: 3  . Years of education: N/A   Occupational History  . Retired    Social History Main Topics  . Smoking status: Never Smoker  . Smokeless tobacco: Never Used  . Alcohol use 0.0 oz/week     Comment: Beer weekly/glass of wine daily  . Drug use: No  . Sexual activity: Not on file   Other Topics Concern  . Not on file   Social History Narrative  . No narrative on file    Past Surgical History:  Procedure Laterality Date  . HERNIA REPAIR     Childhood    Family History  Problem Relation Age of Onset  . Cancer Other     breast  . Diabetes Other     No Known Allergies  Current Outpatient Prescriptions on File Prior to Visit  Medication Sig Dispense Refill  . aspirin 81 MG tablet Take 81 mg by mouth daily.    . cetirizine (ZYRTEC) 10 MG tablet Take 10 mg by mouth daily.    Marland Kitchen glucose blood (FREESTYLE LITE) test strip Use once daily Dx E11.9 100 each 12  . Lancets (FREESTYLE) lancets USE ONCE DAILY, DX 250.OO 100 each 11  . metFORMIN (GLUCOPHAGE) 500 MG tablet Half tab every morning 100 tablet 2  . sildenafil (VIAGRA) 100 MG tablet Take  0.5-1 tablets (50-100 mg total) by mouth daily as needed for erectile dysfunction. 10 tablet 10  . simvastatin (ZOCOR) 20 MG tablet TAKE 1 TABLET (20 MG TOTAL) BY MOUTH AT BEDTIME. 90 tablet 4   No current facility-administered medications on file prior to visit.     BP 124/72   Temp 98.2 F (36.8 C) (Oral)   Ht 5' 11.5" (1.816 m)   Wt 233 lb 9.6 oz (106 kg)   BMI 32.13 kg/m       Objective:   Physical Exam  Constitutional: He is oriented to person, place, and time. He appears well-developed and well-nourished. No distress.  HENT:  Head: Normocephalic and atraumatic.  Right Ear: External ear normal.  Left Ear:  External ear normal.  Nose: Nose normal.  Mouth/Throat: Oropharynx is clear and moist. No oropharyngeal exudate.  Eyes: Conjunctivae are normal. Pupils are equal, round, and reactive to light. Right eye exhibits no discharge. Left eye exhibits no discharge. No scleral icterus.  Neck: Normal range of motion. Neck supple. No JVD present. Carotid bruit is not present. No tracheal deviation present. No thyromegaly present.  Cardiovascular: Normal rate, regular rhythm, normal heart sounds and intact distal pulses.  Exam reveals no gallop and no friction rub.   No murmur heard. Pulmonary/Chest: Effort normal and breath sounds normal. No stridor. No respiratory distress. He has no wheezes. He has no rales. He exhibits no tenderness.  Abdominal: Soft. Bowel sounds are normal. He exhibits no distension and no mass. There is no tenderness. There is no rebound and no guarding.  Musculoskeletal: Normal range of motion.  Lymphadenopathy:    He has no cervical adenopathy.  Neurological: He is alert and oriented to person, place, and time. No cranial nerve deficit. Coordination normal.  Skin: Skin is warm and dry. No rash noted. He is not diaphoretic. No erythema. No pallor.  Psychiatric: He has a normal mood and affect. Judgment and thought content normal.  Nursing note and vitals reviewed.     Assessment & Plan:  1. Routine general medical examination at a health care facility - Reviewed labs in detail with patient, all questions answered - Continue to stay healthy and work on a diabetic diet - Low up in 1 year for next annual exam  2. Type 2 diabetes mellitus without complication, without long-term current use of insulin (HCC) - A1c up from 6.5 to 6.7  - Change in medication at this time - Educated on the importance of a diabetic diet  3. H/O hyperthyroidism -TSH consistently low, but T3-T4 both within normal range - T4, Free - T3, Free  4. Hyperlipidemia, unspecified hyperlipidemia type Lab  Results  Component Value Date   CHOL 129 03/28/2016   HDL 42.10 03/28/2016   LDLCALC 69 03/28/2016   TRIG 89.0 03/28/2016   CHOLHDL 3 03/28/2016     5. Memory loss Continue with neurology appointments as directed  6. ERECTILE DYSFUNCTION, MILD - sildenafil (REVATIO) 20 MG tablet; Take 2-4 tablets as needed  Dispense: 50 tablet; Refill: 6  Dorothyann Peng, NP

## 2016-04-12 ENCOUNTER — Telehealth: Payer: Self-pay | Admitting: Adult Health

## 2016-04-12 NOTE — Telephone Encounter (Signed)
See below

## 2016-04-12 NOTE — Telephone Encounter (Signed)
Patient called regarding his appointment with his appointment for the dermatologist with Dr. Allyson Sabal 05/03/2016 at 1030. They need to know if it is full body or few spots. Patient prefer few spots but would do whatever Tommi Rumps says.

## 2016-04-12 NOTE — Telephone Encounter (Signed)
A few spots this time but I would advise a full body skin evaluation  in the future

## 2016-04-13 NOTE — Telephone Encounter (Signed)
Patient and [facility] notified of Cory's comments.

## 2016-04-16 ENCOUNTER — Other Ambulatory Visit: Payer: Self-pay | Admitting: Family Medicine

## 2016-04-26 ENCOUNTER — Encounter: Payer: Self-pay | Admitting: Psychology

## 2016-04-26 ENCOUNTER — Ambulatory Visit (INDEPENDENT_AMBULATORY_CARE_PROVIDER_SITE_OTHER): Payer: PPO | Admitting: Psychology

## 2016-04-26 DIAGNOSIS — R413 Other amnesia: Secondary | ICD-10-CM

## 2016-04-26 NOTE — Progress Notes (Signed)
NEUROPSYCHOLOGICAL INTERVIEW (CPT: [id number])  Name: Erik Lara Date of Birth: [birth year] Date of Interview: 04/26/2016  Reason for Referral:  Erik Lara is a 81 y.o., right handed male who is referred for neuropsychological evaluation by Dr. Ellouise Newer of [facility] due to concerns about memory loss. This patient is accompanied in the office by his wife who supplements the history.  History of Presenting Problem:  Erik Lara first saw Dr. Delice Lesch in January 2017 for memory complaints. MMSE at that time was 28/30. Brain MRI on 04/14/2015 revealed nothing acute, moderate cerebral atrophy, and minimal chronic small vessel ischemic disease. He followed up with Dr. Delice Lesch on 02/2016 and reported that he continues to function independently but is getting lost briefly 1-2 times a week when driving.  At today's appointment he explains that when he is driving, he will have a brief moment in which he feels he does not know exactly how to get to his destination. However, he has never actually gotten lost. He states that he is moreso having doubts about routes than forgetting routes. His wife mentions that he never did have a good sense of direction.  Upon direct questioning, the patient reported that he does not have a good memory but he never did. He has always compensated by writing things down. He even recalls having trouble recalling information as a child, but if he went and did something else, he would be able to recall the information. His wife states he has always repeated himself and this is nothing new. He does misplace/lose things "for very short intervals". He does not miss appointments. He does not miss medications. He does not forget recent conversations or events. He denies difficulty concentrating. He does endorse starting but not finishing tasks, distractibility, and some word finding difficulty.  He continues to manage all instrumental ADLs including driving, medications,  finances, appointments.  He goes to an exercise class 3x/week. He helps his wife with gardening and other household projects. He has a history of one fall in the past year.  His mood is good, stable. He does admit having some stress related to his children/grandchildren. He denies any difficulties with sleep or appetite. His wife denies any change in mood or personality.  Psychiatric history was denied. He has never been treated for a mental health condition.  When asked about family history of memory loss, he noted that he suspects his mother had some dementia; his wife thinks she was depressed.   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 He had a 6 year career in the [employer]. 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 He retired at least 10 years ago, but he continued to do some part time jobs to stay busy/active. Marital history: Married x53 years with 3 children and 5 grandchildren. Alcohol/Tobacco/Substances: Occasional drinker. 1 beer or 1 glass of wine almost daily. If go to a party, 2-3 drinks. Never a smoker. No SA.   Medical History: Past Medical History:  Diagnosis Date  . Cysts    liver  . Diabetes mellitus ([place])   . Gallstones    asymptomatic  . Hyperlipidemia   . Obesity   . Prostatitis    requiring a trip to the ER in [place]  with a fever - 09     Current Medications:  Outpatient Encounter Prescriptions as of 04/26/2016  Medication Sig  . aspirin 81 MG tablet Take 81 mg by mouth daily.  . cetirizine (ZYRTEC) 10 MG tablet Take 10 mg by mouth daily.  Marland Kitchen glucose blood (FREESTYLE LITE) test strip Use once daily Dx E11.9  . Lancets (FREESTYLE) lancets USE ONCE DAILY, DX 250.OO  . metFORMIN (GLUCOPHAGE) 500 MG tablet TAKE  1/2 (ONE-HALF) TABLET BY MOUTH EVERY MORNING  . sildenafil (REVATIO) 20 MG tablet Take 2-4 tablets as needed  . sildenafil (VIAGRA) 100 MG tablet Take 0.5-1 tablets (50-100 mg total) by mouth daily as needed for erectile dysfunction.  . simvastatin (ZOCOR) 20 MG tablet TAKE 1 TABLET (20 MG TOTAL) BY MOUTH AT BEDTIME.   No facility-administered encounter medications on file as of 04/26/2016.     Behavioral Observations:   Appearance: Neatly and appropriately dressed and groomed Gait: Ambulated independently, no abnormalities observed Speech: Fluent; normal rate, rhythm and volume.  Thought process: Mildly tangential. Affect: Full, euthymic Interpersonal: Pleasant, appropriate   TESTING: There is medical necessity to proceed with neuropsychological assessment as the results will be used to aid in differential diagnosis and clinical decision-making and to inform specific treatment recommendations. Per the patient, his wife and medical records reviewed, there has been a change in cognitive functioning and a reasonable suspicion of mild cognitive impairment or developing dementia.   PLAN: The patient will return for a full battery of neuropsychological testing with a psychometrician under my supervision. Education regarding testing procedures was provided. Subsequently, the patient will see this provider for a follow-up session at which time his test performances and my impressions and treatment recommendations will be reviewed in detail.   Full neuropsychological evaluation report to follow.

## 2016-05-03 DIAGNOSIS — L570 Actinic keratosis: Secondary | ICD-10-CM | POA: Diagnosis not present

## 2016-05-17 ENCOUNTER — Ambulatory Visit (INDEPENDENT_AMBULATORY_CARE_PROVIDER_SITE_OTHER): Payer: PPO | Admitting: Psychology

## 2016-05-17 DIAGNOSIS — R413 Other amnesia: Secondary | ICD-10-CM

## 2016-05-17 NOTE — Progress Notes (Signed)
   Neuropsychology Note  Erik Lara returned today for 2 hours of neuropsychological testing with technician, Milana Kidney, BS, under the supervision of Dr. Macarthur Critchley. The patient did not appear overtly distressed by the testing session, per behavioral observation or via self-report to the technician. Rest breaks were offered. Erik Lara will return within 2 weeks for a feedback session with Dr. Si Raider at which time his test performances, clinical impressions and treatment recommendations will be reviewed in detail. The patient understands he can contact our office should he require our assistance before this time.  Full report to follow.

## 2016-05-17 NOTE — Progress Notes (Signed)
NEUROPSYCHOLOGICAL EVALUATION   Name:    Erik Lara  Date of Birth:   [birth year] Date of Interview:  04/26/2016 Date of Testing:  05/17/2016   Date of Feedback:  05/24/2016       Background Information:  Reason for Referral:  Erik Lara is a 81 y.o. right handed male referred by Dr. Ellouise Newer to assess Erik current level of cognitive functioning and assist in differential diagnosis. The current evaluation consisted of a review of available medical records, an interview with the patient and Erik Lara, and the completion of a neuropsychological testing battery. Informed consent was obtained.  History of Presenting Problem:  Erik Lara first saw Dr. Delice Lesch in January 2017 for memory complaints. MMSE at that time was 28/30. Brain MRI on 04/14/2015 revealed nothing acute, moderate cerebral atrophy, and minimal chronic small vessel ischemic disease. He followed up with Dr. Delice Lesch on 02/2016 and reported that he continues to function independently but is getting lost briefly 1-2 times a week when driving.  At today's appointment he explains that when he is driving, he will have a brief moment in which he feels he does not know exactly how to get to Erik destination. However, he has never actually gotten lost. He states that he is moreso having doubts about routes than forgetting routes. Erik Lara mentions that he never did have a good sense of direction.  Upon direct questioning, the patient reported that he does not have a good memory but he never did. He has always compensated by writing things down. He even recalls having trouble recalling information as a child, but if he went and did something else, he would be able to recall the information. Erik Lara states he has always repeated himself and this is nothing new. He does misplace/lose things "for very short intervals". He does not miss appointments. He does not miss medications. He does not forget recent conversations or events. He  denies difficulty concentrating. He does endorse starting but not finishing tasks, distractibility, and some word finding difficulty.  He continues to manage all instrumental ADLs including driving, medications, finances, appointments.  He goes to an exercise class 3x/week. He helps Erik Lara with gardening and other household projects. He has a history of one fall in the past year.  Erik mood is good, stable. He does admit having some stress related to Erik children/grandchildren. He denies any difficulties with sleep or appetite. Erik Lara denies any change in mood or personality.  Psychiatric history was denied. He has never been treated for a mental health condition.  When asked about family history of memory loss, he noted that he suspects Erik mother had some dementia; Erik Lara thinks she was depressed.   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 He had a 6 year career in the [employer]. 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 He retired at least 10 years ago, but he continued to do some part time jobs to stay busy/active. Marital history: Married x53 years with 3 children and 5 grandchildren. Alcohol/Tobacco/Substances: 1 beer or 1 glass of wine almost daily. If he goes to a party, 2-3 drinks. Never a smoker. No SA.   Medical History:  Past Medical History:  Diagnosis Date  .  Cysts    liver  . Diabetes mellitus ([place])   . Gallstones    asymptomatic  . Hyperlipidemia   . Obesity   . Prostatitis    requiring a trip to the ER in [place] with a fever - 09    Current medications:  Outpatient Encounter Prescriptions as of 05/24/2016  Medication Sig  . aspirin 81 MG tablet Take 81 mg by mouth daily.  . cetirizine (ZYRTEC) 10 MG  tablet Take 10 mg by mouth daily.  Marland Kitchen glucose blood (FREESTYLE LITE) test strip Use once daily Dx E11.9  . Lancets (FREESTYLE) lancets USE ONCE DAILY, DX 250.OO  . metFORMIN (GLUCOPHAGE) 500 MG tablet TAKE 1/2 (ONE-HALF) TABLET BY MOUTH EVERY MORNING  . sildenafil (REVATIO) 20 MG tablet Take 2-4 tablets as needed  . sildenafil (VIAGRA) 100 MG tablet Take 0.5-1 tablets (50-100 mg total) by mouth daily as needed for erectile dysfunction.  . simvastatin (ZOCOR) 20 MG tablet TAKE 1 TABLET (20 MG TOTAL) BY MOUTH AT BEDTIME.   No facility-administered encounter medications on file as of 05/24/2016.      Current Examination:  Behavioral Observations:   Appearance: Neatly and appropriately dressed and groomed Gait: Ambulated independently, no abnormalities observed Speech: Fluent; normal rate, rhythm and volume.  Thought process: Mildly tangential. Affect: Full, euthymic Interpersonal: Pleasant, appropriate Orientation: Oriented to all spheres. Accurately named the current President and Erik predecessor.  Tests Administered: . Test of Premorbid Functioning (TOPF) . Wechsler Adult Intelligence Scale-Fourth Edition (WAIS-IV): Similarities, Music therapist, Coding and Digit Span subtests . Wechsler Memory Scale-Fourth Edition (WMS-IV) Older Adult Version (ages 41-90): Logical Memory I, II and Recognition subtests  . [employer] Verbal Learning Test - 2nd Edition (CVLT-2) Short Form . Repeatable Battery for the Assessment of Neuropsychological Status (RBANS) Form A:  Figure Copy and Recall subtests and Semantic Fluency subtest . Neuropsychological Assessment Battery (NAB) Language Module, Form 1: Naming Subtest . Controlled Oral Word Association Test (COWAT) . Trail Making Test A and B . Clock drawing test . Geriatric Depression Scale (GDS) 15 Item . Generalized Anxiety Disorder - 7 item screener (GAD-7)  Test Results: Note: Standardized scores are presented only for use by appropriately trained  professionals and to allow for any future test-retest comparison. These scores should not be interpreted without consideration of all the information that is contained in the rest of the report. The most recent standardization samples from the test publisher or other sources were used whenever possible to derive standard scores; scores were corrected for age, gender, ethnicity and education when available.   Test Scores:  Test Name Raw Score Standardized Score Descriptor  TOPF 32/70 SS= 94 Average  WAIS-IV Subtests     Similarities 22/36 ss= 10 Average  Block Design 29/66 ss= 10 Average  Coding 40/135 ss= 9 Average  Digit Span Forward 9/16 ss= 9 Average  Digit Span Backward 6/16 ss= 8 Average  WMS-IV Subtests     LM I 20/53 ss= 7 Low average  LM II 15/39 ss= 10 Average  LM II Recognition 19/23 Cum %: 51-75   RBANS Subtests     Figure Copy 17/20 Z= -0.4 Average  Figure Recall 15/20 Z= 0.6 Average  Semantic Fluency 14/40 Z= -1.1 Low average  CVLT-II Scores     Trial 1 4/9 Z= -1 Low average  Trial 4 6/9 Z= -1 Low average  Trials 1-4 total 18/36 T= 37 Low average  SD Free Recall 5/9 Z= -1 Low average  LD Free Recall  5/9 Z= 0 Average  LD Cued Recall 5/9 Z= -0.5 Average  Recognition Discriminability 7/9 hits, 1 false positive Z= 0 Average  Forced Choice Recognition 9/9  WNL  NAB Naming 31/31 T= 59 High average  COWAT-FAS 13 T= 26 Impaired  COWAT-Animals 10 T= 30 Impaired  Trail Making Test A  56" 0 errors T= 45 Average  Trail Making Test B  295" 4 errors T= 15 Severely impaired  Clock Drawing   WNL   GDS-15 0/15  WNL   GAD-7 7/21  Mild       Description of Test Results:  Premorbid verbal intellectual abilities were estimated to have been within the average range based on a test of word reading. Psychomotor processing speed was average. Auditory attention and working memory were average. Visual-spatial construction was average. Language abilities were variable. Specifically,  confrontation naming was high average with 100% accuracy, and semantic verbal fluency ranged from low average to impaired. With regard to verbal memory, encoding and acquisition of non-contextual information (i.e., word list) was low average. After a brief distracter task, free recall was low average. After a delay, free recall was average. Cued recall was average. Performance on a yes/no recognition task was average. On another verbal memory test, encoding and acquisition of contextual auditory information (i.e., short story) was low average. After a delay, free recall was average. Performance on a yes/no recognition task was intact. With regard to non-verbal memory, delayed free recall of visual information was average. Executive functioning was somewhat variable. Mental flexibility and set-shifting were severely impaired on Trails B; he committed four set-loss errors on the task. Verbal fluency with phonemic search restrictions was impaired. Verbal abstract reasoning was average. Performance on a clock drawing task was intact. On self-report questionnaires, the patient's responses were indicative of mild generalized anxiety at the present time. Symptoms endorsed included: trouble relaxing, nervousness, inability to control worrying, worrying too much about different things, and becoming easily annoyed. He denied significant depression.   Clinical Impression: Nonamnestic mild cognitive impairment (MCI). Results of the current cognitive evaluation are largely within normal limits, with most areas of function consistent with estimated premorbid intellectual abilities. However, there are a few areas of impairment suggestive of mild fronto-subcortical dysfunction (e.g., impaired verbal fluency, impaired mental flexibility/set-shifting). Erik testing results do not warrant a diagnosis of dementia; however, a diagnosis mild cognitive impairment is appropriate at this time. In terms of etiology, I am surprised that he  did not have more pronounced signs of vascular damage on neuroimaging, because Erik testing results are commonly associated with subcortical chronic microvascular ischemia. It is possible that there is a longstanding history of attention/executive functioning difficulties (such as ADHD) based on Erik self-report. I do not suspect underlying Alzheimer's disease based on Erik current testing profile. There is no evidence of significant depression but he does have some mild generalized anxiety.    Recommendations/Plan: Based on the findings of the present evaluation, the following recommendations are offered:  1. Optimal control of vascular risk factors is encouraged to reduce the risk of vascular related cognitive decline. We discussed the importance of continuing to engage in activities that involve cardiovascular exercise, mental stimulation and social engagement. 2. Neuropsychological re-evaluation in one year is indicated in order to track any progression of symptoms, monitor cognitive functioning and further assist with treatment planning. 3. Formal driving evaluation could be considered given the patient's subjective complaints and Erik performance on a cognitive test highly correlated with driving ability. He was encouraged  to avoid driving at night and to have someone with him when he is driving as this seems to help him with remembering directions.   Feedback to Patient: Erik Lara and Erik Lara returned for a feedback appointment on 05/24/2016 to review the results of Erik neuropsychological evaluation with this provider. 30 minutes face-to-face time was spent reviewing Erik test results, my impressions and my recommendations as detailed above. Written information on MCI was provided.   Total time spent on this patient's case: [id number] unit for interview with psychologist; [phone] units of testing by psychometrician under psychologist's supervision; [phone] units for medical record review,  scoring of neuropsychological tests, interpretation of test results, preparation of this report, and review of results to the patient by psychologist.      Thank you for your referral of Erik Lara. Please feel free to contact me if you have any questions or concerns regarding this report.

## 2016-05-24 ENCOUNTER — Ambulatory Visit (INDEPENDENT_AMBULATORY_CARE_PROVIDER_SITE_OTHER): Payer: PPO | Admitting: Psychology

## 2016-05-24 ENCOUNTER — Encounter: Payer: Self-pay | Admitting: Psychology

## 2016-05-24 DIAGNOSIS — R413 Other amnesia: Secondary | ICD-10-CM | POA: Diagnosis not present

## 2016-05-24 DIAGNOSIS — G3184 Mild cognitive impairment, so stated: Secondary | ICD-10-CM

## 2016-08-02 DIAGNOSIS — L820 Inflamed seborrheic keratosis: Secondary | ICD-10-CM | POA: Diagnosis not present

## 2016-08-02 DIAGNOSIS — L570 Actinic keratosis: Secondary | ICD-10-CM | POA: Diagnosis not present

## 2016-09-06 ENCOUNTER — Ambulatory Visit (INDEPENDENT_AMBULATORY_CARE_PROVIDER_SITE_OTHER): Payer: PPO | Admitting: Adult Health

## 2016-09-06 ENCOUNTER — Encounter: Payer: Self-pay | Admitting: Adult Health

## 2016-09-06 VITALS — BP 126/62 | Temp 98.1°F | Ht 71.5 in | Wt 234.2 lb

## 2016-09-06 DIAGNOSIS — E119 Type 2 diabetes mellitus without complications: Secondary | ICD-10-CM

## 2016-09-06 LAB — POCT GLYCOSYLATED HEMOGLOBIN (HGB A1C): Hemoglobin A1C: 6.6

## 2016-09-06 NOTE — Progress Notes (Signed)
Subjective:    Patient ID: Erik Lara, male    DOB: 07-01-1935, 81 y.o.   MRN: [id number]  HPI  81 year old male who  has a past medical history of Cysts; Diabetes mellitus ([place]); Gallstones; Hyperlipidemia; Obesity; and Prostatitis. He present to the office today for follow up regarding diabetes. It has been 6 months since his last visit. His blood sugars are currently managed by Metformin 250 mg daily.   His last A1c was 6.7 in January 2018   Today in the office he reports " I have been trying to be good when it comes to my diet". He is taking his medication as directed.   He reports that his blood sugars at home have been consistently in the 130-140;s .  He continues to do an exercise class three days a week    Review of Systems See HPI   Past Medical History:  Diagnosis Date  . Cysts    liver  . Diabetes mellitus ([place])   . Gallstones    asymptomatic  . Hyperlipidemia   . Obesity   . Prostatitis    requiring a trip to the ER in [place] with a fever - 09    Social History   Social History  . Marital status: Married    Spouse name: N/A  . Number of children: 3  . Years of education: N/A   Occupational History  . Retired    Social History Main Topics  . Smoking status: Never Smoker  . Smokeless tobacco: Never Used  . Alcohol use 0.0 oz/week     Comment: Beer weekly/glass of wine daily  . Drug use: No  . Sexual activity: Not on file   Other Topics Concern  . Not on file   Social History Narrative  . No narrative on file    Past Surgical History:  Procedure Laterality Date  . HERNIA REPAIR     Childhood    Family History  Problem Relation Age of Onset  . Cancer Other        breast  . Diabetes Other     No Known Allergies  Current Outpatient Prescriptions on File Prior to Visit  Medication Sig Dispense Refill  . aspirin 81 MG tablet Take 81 mg by mouth daily.    . cetirizine (ZYRTEC) 10 MG tablet Take 10 mg by mouth daily.    Marland Kitchen  glucose blood (FREESTYLE LITE) test strip Use once daily Dx E11.9 100 each 12  . Lancets (FREESTYLE) lancets USE ONCE DAILY, DX 250.OO 100 each 11  . metFORMIN (GLUCOPHAGE) 500 MG tablet TAKE 1/2 (ONE-HALF) TABLET BY MOUTH EVERY MORNING 45 tablet 4  . sildenafil (REVATIO) 20 MG tablet Take 2-4 tablets as needed 50 tablet 6  . sildenafil (VIAGRA) 100 MG tablet Take 0.5-1 tablets (50-100 mg total) by mouth daily as needed for erectile dysfunction. 10 tablet 10  . simvastatin (ZOCOR) 20 MG tablet TAKE 1 TABLET (20 MG TOTAL) BY MOUTH AT BEDTIME. 90 tablet 4   No current facility-administered medications on file prior to visit.     BP 126/62 (BP Location: Left Arm, Patient Position: Sitting, Cuff Size: Normal)   Temp 98.1 F (36.7 C) (Oral)   Ht 5' 11.5" (1.816 m)   Wt 234 lb 3.2 oz (106.2 kg)   BMI 32.21 kg/m       Objective:   Physical Exam  Constitutional: He is oriented to person, place, and time. He appears well-developed and  well-nourished. No distress.  Cardiovascular: Normal rate, regular rhythm, normal heart sounds and intact distal pulses.  Exam reveals no gallop and no friction rub.   No murmur heard. Pulmonary/Chest: Effort normal and breath sounds normal. No respiratory distress. He has no wheezes. He has no rales. He exhibits no tenderness.  Neurological: He is alert and oriented to person, place, and time.  Skin: Skin is warm and dry. No rash noted. He is not diaphoretic. No erythema. No pallor.  Psychiatric: He has a normal mood and affect. His behavior is normal. Judgment and thought content normal.  Nursing note and vitals reviewed.     Assessment & Plan:  1. Type 2 diabetes mellitus without complication, without long-term current use of insulin (HCC) - POC HgB A1c- 6.6  - Continue with working on diet and staying active  - Continue with current medication therapy - Follow up in January at [facility], [facility]

## 2016-09-08 ENCOUNTER — Ambulatory Visit: Payer: PPO | Admitting: Adult Health

## 2016-09-13 ENCOUNTER — Encounter: Payer: Self-pay | Admitting: Neurology

## 2016-09-13 ENCOUNTER — Ambulatory Visit (INDEPENDENT_AMBULATORY_CARE_PROVIDER_SITE_OTHER): Payer: PPO | Admitting: Neurology

## 2016-09-13 VITALS — BP 152/64 | HR 76 | Ht 72.0 in | Wt 234.0 lb

## 2016-09-13 DIAGNOSIS — G3184 Mild cognitive impairment, so stated: Secondary | ICD-10-CM | POA: Diagnosis not present

## 2016-09-13 DIAGNOSIS — F0280 Dementia in other diseases classified elsewhere without behavioral disturbance: Secondary | ICD-10-CM | POA: Insufficient documentation

## 2016-09-13 HISTORY — DX: Mild cognitive impairment of uncertain or unknown etiology: G31.84

## 2016-09-13 NOTE — Progress Notes (Signed)
NEUROLOGY FOLLOW UP OFFICE NOTE  Erik Lara [id number]  HISTORY OF PRESENT ILLNESS: I had the pleasure of seeing Erik Lara in follow-up in the neurology clinic on 09/13/2016.  The patient was last seen 6 years ago for worsening memory. almost a year ago for worsening memory. His MMSE in January 2017 was 28/30. I personally reviewed MRI brain without contrast done 04/14/15 which did not show any acute changes. There was moderate cerebral atrophy and minimal chronic microvascular disease. B12 level was low normal, 263. He reported getting lost driving 1-2 times a week and declined doing a driving evaluation. He was referred for Neuropsychological evaluation which was largely within normal limits. There were a few areas of impairment of mild fronto-subcortical dysfunction. Testing did not warrant a diagnosis of dementia, but mild cognitive impairment was appropriate at this time. There is a longstanding history of attention/executive functioning difficulties (such as ADHD) based on self-report. Recommendations included optimal control of vascular risk factors, repeat testing in a year, and consideration for formal driving evaluation given his subjective complaints and performance on cognitive test highly correlated with driving. He reports that he has not had any driving difficulties since his last visit. Mostly he would find himself in a spot he does not recognize, drives a block later and then reorients himself. He could not remember what floor our office was on today. He is able to retrace his steps. He denies getting lost. He occasional forgets his medications, but is aware he forgot it. He denies any missed bill payments. He denies any headaches, dizziness, diplopia, dysarthria, dysphagia, neck/back pain, focal numbness/tingling/weakness, bowel/bladder dysfunction, tremors. No falls.  HPI 03/30/15: This is a pleasant 81 yo RH man with a history of hyperlipidemia, diabetes, who presented for  memory loss. He started noticing changes around 1-1/2 years ago, he would notice his memory would be "spotty," with a blank spot occurring once in a while. He reported this to his PCP, then again on his next follow-up visit last November 2016. He was concerned about an episode [place] 2 [place] a familiar place, when he could not recall which exit to take. He missed the exit and had to turn around. He has noticed moments while driving where he briefly has a blank spot ("new garden or spring garden") then quickly passes. He has been told by his wife that he repeats himself. He has always had word-finding difficulties, but noticed this has worsened. He infrequently misses bill payments, stating he has a record system, but would miss paying if he gets very busy. He denies any missed medications. He denies any family history of dementia, history of head injury or alcohol use.  He has had poor sense of smell for many years. He has noticed some difficulty reading from afar, better if he looks closer. He has occasional cramping in the calf muscles, left>right, worse at night. He denies any falls. He goes to exercise class 3-4 times a week.   PAST MEDICAL HISTORY: Past Medical History:  Diagnosis Date  . Cysts    liver  . Diabetes mellitus ([place])   . Gallstones    asymptomatic  . Hyperlipidemia   . Obesity   . Prostatitis    requiring a trip to the ER in [place] with a fever - 09    MEDICATIONS: Current Outpatient Prescriptions on File Prior to Visit  Medication Sig Dispense Refill  . aspirin 81 MG tablet Take 81 mg by mouth  daily.    . cetirizine (ZYRTEC) 10 MG tablet Take 10 mg by mouth daily.    Marland Kitchen glucose blood (FREESTYLE LITE) test strip Use once daily Dx E11.9 100 each 12  . Lancets (FREESTYLE) lancets USE ONCE DAILY, DX 250.OO 100 each 11  . metFORMIN (GLUCOPHAGE) 500 MG tablet TAKE 1/2 (ONE-HALF) TABLET BY MOUTH EVERY MORNING 45 tablet 4  . sildenafil  (REVATIO) 20 MG tablet Take 2-4 tablets as needed 50 tablet 6  . sildenafil (VIAGRA) 100 MG tablet Take 0.5-1 tablets (50-100 mg total) by mouth daily as needed for erectile dysfunction. 10 tablet 10  . simvastatin (ZOCOR) 20 MG tablet TAKE 1 TABLET (20 MG TOTAL) BY MOUTH AT BEDTIME. 90 tablet 4   No current facility-administered medications on file prior to visit.     ALLERGIES: No Known Allergies  FAMILY HISTORY: Family History  Problem Relation Age of Onset  . Cancer Other        breast  . Diabetes Other     SOCIAL HISTORY: Social History   Social History  . Marital status: Married    Spouse name: N/A  . Number of children: 3  . Years of education: N/A   Occupational History  . Retired    Social History Main Topics  . Smoking status: Never Smoker  . Smokeless tobacco: Never Used  . Alcohol use 0.0 oz/week     Comment: Beer weekly/glass of wine daily  . Drug use: No  . Sexual activity: Not on file   Other Topics Concern  . Not on file   Social History Narrative  . No narrative on file    REVIEW OF SYSTEMS: Constitutional: No fevers, chills, or sweats, no generalized fatigue, change in appetite Eyes: No visual changes, double vision, eye pain Ear, nose and throat: No hearing loss, ear pain, nasal congestion, sore throat Cardiovascular: No chest pain, palpitations Respiratory:  No shortness of breath at rest or with exertion, wheezes GastrointestinaI: No nausea, vomiting, diarrhea, abdominal pain, fecal incontinence Genitourinary:  No dysuria, urinary retention or frequency Musculoskeletal:  No neck pain, back pain Integumentary: No rash, pruritus, skin lesions Neurological: as above Psychiatric: No depression, insomnia, anxiety Endocrine: No palpitations, fatigue, diaphoresis, mood swings, change in appetite, change in weight, increased thirst Hematologic/Lymphatic:  No anemia, purpura, petechiae. Allergic/Immunologic: no itchy/runny eyes, nasal congestion,  recent allergic reactions, rashes  PHYSICAL EXAM: Vitals:   09/13/16 0946  BP: (!) 152/64  Pulse: 76   General: No acute distress Head:  Normocephalic/atraumatic Neck: supple, no paraspinal tenderness, full range of motion Heart:  Regular rate and rhythm Lungs:  Clear to auscultation bilaterally Back: No paraspinal tenderness Skin/Extremities: No rash, no edema Neurological Exam: alert and oriented to person, place, and time. No aphasia or dysarthria. Fund of knowledge is appropriate.  Recent and remote memory are intact. Attention and concentration are normal.    Able to name objects and repeat phrases. Cranial nerves: Pupils equal, round, reactive to light.  Extraocular movements intact with no nystagmus. Visual fields full. Facial sensation intact. No facial asymmetry. Tongue, uvula, palate midline.  Motor: Bulk and tone normal, muscle strength 5/5 throughout with no pronator drift.  Sensation to light touch intact.  No extinction to double simultaneous stimulation.  Deep tendon reflexes 2+ throughout, toes downgoing.  Finger to nose testing intact.  Gait narrow-based and steady, able to tandem walk adequately.  Romberg negative.  IMPRESSION: This is a pleasant 81 yo RH man with a vascular risk factors including  diabetes, hyperlipidemia, who presented last January 2017 for memory loss. Neuropsychological testing did not find any evidence of dementia, he did fit diagnosis of mild cognitive impairment, likely vascular, although MRI did not show much microvascular disease. He reports driving is better, we discussed driving evaluation recommendation, which he declines and would rather monitor driving himself for now, and avoid poor driving conditions. No indication to start cholinesterase inhibitors such as Aricept at this time. We again discussed the importance of control of vascular risk factors, physical exercise, and brain stimulation exercises for brain health. He will follow-up in 1 year and  knows to call for any changes.   Thank you for allowing me to participate in his care.  Please do not hesitate to call for any questions or concerns.  The duration of this appointment visit was 15 minutes of face-to-face time with the patient.  Greater than 50% of this time was spent in counseling, explanation of diagnosis, planning of further management, and coordination of care.   Ellouise Newer, M.D.   CC: Dorothyann Peng, NP

## 2016-09-13 NOTE — Patient Instructions (Signed)
1. Control of blood pressure, cholesterol, diabetes, as well as physical exercise and brain stimulation exercises are important for brain health 2. Continue to monitor driving 3. Follow-up in 1 year, call for any changes

## 2016-12-02 ENCOUNTER — Encounter: Payer: Self-pay | Admitting: Adult Health

## 2017-01-23 ENCOUNTER — Encounter: Payer: Self-pay | Admitting: Family Medicine

## 2017-01-23 ENCOUNTER — Ambulatory Visit: Payer: PPO | Admitting: Family Medicine

## 2017-01-23 VITALS — BP 130/70 | HR 80 | Temp 98.8°F | Wt 226.0 lb

## 2017-01-23 DIAGNOSIS — H9191 Unspecified hearing loss, right ear: Secondary | ICD-10-CM

## 2017-01-23 DIAGNOSIS — R413 Other amnesia: Secondary | ICD-10-CM

## 2017-01-23 DIAGNOSIS — G3184 Mild cognitive impairment, so stated: Secondary | ICD-10-CM

## 2017-01-23 HISTORY — DX: Unspecified hearing loss, right ear: H91.91

## 2017-01-23 MED ORDER — DONEPEZIL HCL 5 MG PO TABS
5.0000 mg | ORAL_TABLET | Freq: Every day | ORAL | 4 refills | Status: DC
Start: 2017-01-23 — End: 2017-04-18

## 2017-01-23 NOTE — Progress Notes (Signed)
Erik Lara is a 81 year old married male nonsmoker who comes in today for evaluation of 3 issues  We send him to see Dr. Ellouise Newer because of a history of short-term memory loss. His last office visit with her was in July. At that time she did not feel he needs any medication. She was diagnosed with mild cognitive impairment. In the meantime he's developed decreased memory which he describes as inability to remember when he goes in from one room to the next when he went therefore, sometimes she'll get lost driving, and now has to write things down because he forgets things.  We had a long discussion of the pros and cons of medication. Will give him a therapeutic trial of Aricept.  He's also noticed decreased hearing which obviously could impair his mental status. I recommend he see an audiologist to have referral hearing evaluation.  BP 130/70 (BP Location: Left Arm, Patient Position: Sitting, Cuff Size: Normal)   Pulse 80   Temp 98.8 F (37.1 C) (Oral)   Wt 226 lb (102.5 kg)   BMI 30.65 kg/m  He's well-developed well-nourished male no acute distress HEENT shows no cerumen impaction.  #1 cognitive impairment mild.......Marland Kitchen with memory loss......Marland Kitchen Aricept 10 mg daily  #2 hearing loss......... audiology consult ASAP.

## 2017-01-23 NOTE — Patient Instructions (Signed)
I would recommend the audiology department at [facility] or [facility] ear nose and throat doctor Tonia Ghent  Aricept 5 mg....... one tablet daily............ follow-up with your neurologist in January

## 2017-02-06 DIAGNOSIS — L570 Actinic keratosis: Secondary | ICD-10-CM | POA: Diagnosis not present

## 2017-03-22 ENCOUNTER — Other Ambulatory Visit: Payer: Self-pay | Admitting: Adult Health

## 2017-03-22 MED ORDER — GLUCOSE BLOOD VI STRP: ORAL_STRIP | 12 refills | Status: DC

## 2017-03-22 NOTE — Telephone Encounter (Signed)
Copied from [place] [id number]. Topic: General - Other >> Mar 22, 2017  9:42 AM Darl Householder, RMA wrote: Reason for CRM: Refill request for Glucose blood (freestyle lite) test strips to be sent to [facility]

## 2017-03-22 NOTE — Telephone Encounter (Signed)
Called pt regarding request for diabetic strips; pt states that the pharmacy says he needs a new prescription for this med; pt has an appointment with Dorothyann Peng 04/18/17 at 0830; will send refill request to [facility]; pt verbalizes understanding.

## 2017-03-29 ENCOUNTER — Ambulatory Visit (INDEPENDENT_AMBULATORY_CARE_PROVIDER_SITE_OTHER): Payer: PPO | Admitting: Neurology

## 2017-03-29 ENCOUNTER — Encounter: Payer: Self-pay | Admitting: Neurology

## 2017-03-29 VITALS — BP 140/66 | HR 66 | Ht 73.5 in | Wt 228.0 lb

## 2017-03-29 DIAGNOSIS — G3184 Mild cognitive impairment, so stated: Secondary | ICD-10-CM

## 2017-03-29 NOTE — Progress Notes (Signed)
NEUROLOGY FOLLOW UP OFFICE NOTE  SHEFFIELD HAWKER [id number]  DOB: 09/27/1935  HISTORY OF PRESENT ILLNESS: I had the pleasure of seeing Erik Lara in follow-up in the neurology clinic on 03/29/2017.  The patient was last seen 6 months ago for worsening memory. Neuropsychological testing in March 2018 showed mild cognitive impairment, likely vascular. Testing did not warrant a diagnosis of dementia. MRI brain without contrast done 04/14/15 did not show any acute changes. There was moderate cerebral atrophy and minimal chronic microvascular disease. B12 level was low normal, 263. Since his last visit, he saw his PCP in November 2018 and reported worsening memory. He was started on Aricept 5mg  daily. He is not sure if it is his imagination, but it seems he's improving. He feels his short term memory is slightly better. He still gets lost occasionally, and some places he has to make a real effort to know where to turn. Recently he has been able to do his turns without having a panic attack. He frequently misplaces things in his office, he has stacks of papers. He denies missing medications or bill payments. He exercises three times a week. He denies any headaches, dizziness, vision changes, focal numbness/tingling/weakness, no falls.   HPI 03/30/15: This is a pleasant 82 yo RH man with a history of hyperlipidemia, diabetes, who presented for memory loss. He started noticing changes around 1-1/2 years ago, he would notice his memory would be "spotty," with a blank spot occurring once in a while. He reported this to his PCP, then again on his next follow-up visit last November 2016. He was concerned about an episode [place] a familiar place, when he could not recall which exit to take. He missed the exit and had to turn around. He has noticed moments while driving where he briefly has a blank spot ("new garden or spring garden") then quickly passes. He has been told by  his wife that he repeats himself. He has always had word-finding difficulties, but noticed this has worsened. He infrequently misses bill payments, stating he has a record system, but would miss paying if he gets very busy. He denies any missed medications. He denies any family history of dementia, history of head injury or alcohol use.  He has had poor sense of smell for many years. He has noticed some difficulty reading from afar, better if he looks closer. He has occasional cramping in the calf muscles, left>right, worse at night. He denies any falls. He goes to exercise class 3-4 times a week.   PAST MEDICAL HISTORY: Past Medical History:  Diagnosis Date  . Cysts    liver  . Diabetes mellitus ([place])   . Gallstones    asymptomatic  . Hyperlipidemia   . Obesity   . Prostatitis    requiring a trip to the ER in [place] with a fever - 09    MEDICATIONS: Current Outpatient Medications on File Prior to Visit  Medication Sig Dispense Refill  . aspirin 81 MG tablet Take 81 mg by mouth daily.    . cetirizine (ZYRTEC) 10 MG tablet Take 10 mg by mouth daily.    Marland Kitchen donepezil (ARICEPT) 5 MG tablet Take 1 tablet (5 mg total) at bedtime by mouth. 100 tablet 4  . glucose blood (FREESTYLE LITE) test strip Use once daily Dx E11.9 100 each 12  . Lancets (FREESTYLE) lancets USE ONCE DAILY, DX 250.OO 100 each 11  . metFORMIN (GLUCOPHAGE) 500  MG tablet TAKE 1/2 (ONE-HALF) TABLET BY MOUTH EVERY MORNING 45 tablet 4  . sildenafil (REVATIO) 20 MG tablet Take 2-4 tablets as needed (Patient not taking: Reported on 01/23/2017) 50 tablet 6  . simvastatin (ZOCOR) 20 MG tablet TAKE 1 TABLET (20 MG TOTAL) BY MOUTH AT BEDTIME. 90 tablet 4   No current facility-administered medications on file prior to visit.     ALLERGIES: No Known Allergies  FAMILY HISTORY: Family History  Problem Relation Age of Onset  . Cancer Other        breast  . Diabetes Other     SOCIAL HISTORY: Social History    Socioeconomic History  . Marital status: Married    Spouse name: Not on file  . Number of children: 3  . Years of education: Not on file  . Highest education level: Not on file  Social Needs  . Financial resource strain: Not on file  . Food insecurity - worry: Not on file  . Food insecurity - inability: Not on file  . Transportation needs - medical: Not on file  . Transportation needs - non-medical: Not on file  Occupational History  . Occupation: Retired  Tobacco Use  . Smoking status: Never Smoker  . Smokeless tobacco: Never Used  Substance and Sexual Activity  . Alcohol use: Yes    Alcohol/week: 0.0 oz    Comment: Beer weekly/glass of wine daily  . Drug use: No  . Sexual activity: Not on file  Other Topics Concern  . Not on file  Social History Narrative  . Not on file    REVIEW OF SYSTEMS: Constitutional: No fevers, chills, or sweats, no generalized fatigue, change in appetite Eyes: No visual changes, double vision, eye pain Ear, nose and throat: No hearing loss, ear pain, nasal congestion, sore throat Cardiovascular: No chest pain, palpitations Respiratory:  No shortness of breath at rest or with exertion, wheezes GastrointestinaI: No nausea, vomiting, diarrhea, abdominal pain, fecal incontinence Genitourinary:  No dysuria, urinary retention or frequency Musculoskeletal:  No neck pain, back pain Integumentary: No rash, pruritus, skin lesions Neurological: as above Psychiatric: No depression, insomnia, anxiety Endocrine: No palpitations, fatigue, diaphoresis, mood swings, change in appetite, change in weight, increased thirst Hematologic/Lymphatic:  No anemia, purpura, petechiae. Allergic/Immunologic: no itchy/runny eyes, nasal congestion, recent allergic reactions, rashes  PHYSICAL EXAM: Vitals:   03/29/17 1506  BP: 140/66  Pulse: 66  SpO2: 96%   General: No acute distress Head:  Normocephalic/atraumatic Neck: supple, no paraspinal tenderness, full range  of motion Heart:  Regular rate and rhythm Lungs:  Clear to auscultation bilaterally Back: No paraspinal tenderness Skin/Extremities: No rash, no edema Neurological Exam: alert and oriented to person, place, and time. No aphasia or dysarthria. Fund of knowledge is appropriate.  Recent and remote memory are intact. Attention and concentration are normal.    Able to name objects and repeat phrases.  MMSE - Mini Mental State Exam 03/29/2017 03/30/2015  Orientation to time 5 5  Orientation to Place 5 5  Registration 3 3  Attention/ Calculation 4 4  Recall 3 2  Language- name 2 objects 2 2  Language- repeat 1 1  Language- follow 3 step command 3 3  Language- read & follow direction 1 1  Write a sentence 1 1  Copy design 1 1  Total score 29 28   Cranial nerves: Pupils equal, round, reactive to light.  Extraocular movements intact with no nystagmus. Visual fields full. Facial sensation intact. No facial asymmetry. Tongue,  uvula, palate midline.  Motor: Bulk and tone normal, muscle strength 5/5 throughout with no pronator drift.  Sensation to light touch intact.  No extinction to double simultaneous stimulation.  Deep tendon reflexes 2+ throughout, toes downgoing.  Finger to nose testing intact.  Gait favoring right knee with hunched posture, difficulty with tandem walk.  Romberg negative.  IMPRESSION: This is a pleasant 82 yo RH man with a vascular risk factors including diabetes, hyperlipidemia, who presented last January 2017 for memory loss. Neuropsychological testing did not find any evidence of dementia, he did fit diagnosis of mild cognitive impairment, likely vascular, although MRI did not show much microvascular disease. He was started on Aricept 5mg  daily last November 2018, no side effects, he feels it may be helping. We discussed repeating Neuropsychological evaluation for interval follow-up. We again discussed the importance of control of vascular risk factors, physical exercise, and brain  stimulation exercises for brain health. He will follow-up after memory testing and knows to call for any changes.   Thank you for allowing me to participate in his care.  Please do not hesitate to call for any questions or concerns.  The duration of this appointment visit was 25 minutes of face-to-face time with the patient.  Greater than 50% of this time was spent in counseling, explanation of diagnosis, planning of further management, and coordination of care.   Ellouise Newer, M.D.   CC: Dorothyann Peng, NP

## 2017-03-29 NOTE — Patient Instructions (Signed)
1. Schedule Neurocognitive testing 2. Continue Donepezil 5mg  daily 3. Follow-up after memory testing  RECOMMENDATIONS FOR ALL PATIENTS WITH MEMORY PROBLEMS: 1. Continue to exercise (Recommend 30 minutes of walking everyday, or 3 hours every week) 2. Increase social interactions - continue going to [place] and enjoy social gatherings with friends and family 3. Eat healthy, avoid fried foods and eat more fruits and vegetables 4. Maintain adequate blood pressure, blood sugar, and blood cholesterol level. Reducing the risk of stroke and cardiovascular disease also helps promoting better memory. 5. Avoid stressful situations. Live a simple life and avoid aggravations. Organize your time and prepare for the next day in anticipation. 6. Sleep well, avoid any interruptions of sleep and avoid any distractions in the bedroom that may interfere with adequate sleep quality 7. Avoid sugar, avoid sweets as there is a strong link between excessive sugar intake, diabetes, and cognitive impairment The Mediterranean diet has been shown to help patients reduce the risk of progressive memory disorders and reduces cardiovascular risk. This includes eating fish, eat fruits and green leafy vegetables, nuts like almonds and hazelnuts, walnuts, and also use olive oil. Avoid fast foods and fried foods as much as possible. Avoid sweets and sugar as sugar use has been linked to worsening of memory function.

## 2017-04-06 DIAGNOSIS — E119 Type 2 diabetes mellitus without complications: Secondary | ICD-10-CM | POA: Diagnosis not present

## 2017-04-06 LAB — HM DIABETES EYE EXAM

## 2017-04-17 ENCOUNTER — Encounter: Payer: Self-pay | Admitting: Family Medicine

## 2017-04-18 ENCOUNTER — Encounter: Payer: Self-pay | Admitting: Adult Health

## 2017-04-18 ENCOUNTER — Ambulatory Visit (INDEPENDENT_AMBULATORY_CARE_PROVIDER_SITE_OTHER): Payer: PPO | Admitting: Adult Health

## 2017-04-18 VITALS — BP 120/70 | Temp 98.4°F | Ht 72.0 in | Wt 226.0 lb

## 2017-04-18 DIAGNOSIS — E785 Hyperlipidemia, unspecified: Secondary | ICD-10-CM | POA: Diagnosis not present

## 2017-04-18 DIAGNOSIS — G3184 Mild cognitive impairment, so stated: Secondary | ICD-10-CM | POA: Diagnosis not present

## 2017-04-18 DIAGNOSIS — Z0000 Encounter for general adult medical examination without abnormal findings: Secondary | ICD-10-CM | POA: Diagnosis not present

## 2017-04-18 DIAGNOSIS — E119 Type 2 diabetes mellitus without complications: Secondary | ICD-10-CM

## 2017-04-18 LAB — CBC WITH DIFFERENTIAL/PLATELET
BASOS PCT: 0.4 % (ref 0.0–3.0)
Basophils Absolute: 0 10*3/uL (ref 0.0–0.1)
EOS PCT: 1.1 % (ref 0.0–5.0)
Eosinophils Absolute: 0.1 10*3/uL (ref 0.0–0.7)
HCT: 42.1 % (ref 39.0–52.0)
HEMOGLOBIN: 14.2 g/dL (ref 13.0–17.0)
LYMPHS ABS: 1.1 10*3/uL (ref 0.7–4.0)
Lymphocytes Relative: 11.4 % — ABNORMAL LOW (ref 12.0–46.0)
MCHC: 33.6 g/dL (ref 30.0–36.0)
MCV: 88.9 fl (ref 78.0–100.0)
MONO ABS: 0.9 10*3/uL (ref 0.1–1.0)
MONOS PCT: 9.3 % (ref 3.0–12.0)
Neutro Abs: 7.3 10*3/uL (ref 1.4–7.7)
Neutrophils Relative %: 77.8 % — ABNORMAL HIGH (ref 43.0–77.0)
Platelets: 287 10*3/uL (ref 150.0–400.0)
RBC: 4.74 Mil/uL (ref 4.22–5.81)
RDW: 13.7 % (ref 11.5–15.5)
WBC: 9.4 10*3/uL (ref 4.0–10.5)

## 2017-04-18 LAB — HEPATIC FUNCTION PANEL
ALBUMIN: 3.9 g/dL (ref 3.5–5.2)
ALK PHOS: 75 U/L (ref 39–117)
ALT: 16 U/L (ref 0–53)
AST: 14 U/L (ref 0–37)
Bilirubin, Direct: 0.3 mg/dL (ref 0.0–0.3)
TOTAL PROTEIN: 6.5 g/dL (ref 6.0–8.3)
Total Bilirubin: 1.3 mg/dL — ABNORMAL HIGH (ref 0.2–1.2)

## 2017-04-18 LAB — LIPID PANEL
CHOLESTEROL: 115 mg/dL (ref 0–200)
HDL: 45.7 mg/dL (ref >39.00)
LDL Cholesterol: 54 mg/dL (ref 0–99)
NONHDL: 69.15
Total CHOL/HDL Ratio: 3
Triglycerides: 75 mg/dL (ref 0.0–149.0)
VLDL: 15 mg/dL (ref 0.0–40.0)

## 2017-04-18 LAB — BASIC METABOLIC PANEL
BUN: 10 mg/dL (ref 6–23)
CHLORIDE: 101 meq/L (ref 96–112)
CO2: 29 mEq/L (ref 19–32)
Calcium: 9.3 mg/dL (ref 8.4–10.5)
Creatinine, Ser: 0.62 mg/dL (ref 0.40–1.50)
GFR: 132.65 mL/min (ref >60.00)
GLUCOSE: 155 mg/dL — AB (ref 70–99)
POTASSIUM: 4.5 meq/L (ref 3.5–5.1)
SODIUM: 137 meq/L (ref 135–145)

## 2017-04-18 LAB — HEMOGLOBIN A1C: Hgb A1c MFr Bld: 7 % — ABNORMAL HIGH (ref 4.6–6.5)

## 2017-04-18 LAB — TSH: TSH: 0.17 u[IU]/mL — AB (ref 0.35–4.50)

## 2017-04-18 MED ORDER — DONEPEZIL HCL 5 MG PO TABS: 5.0000 mg | ORAL_TABLET | Freq: Every day | ORAL | 3 refills | Status: DC

## 2017-04-18 NOTE — Progress Notes (Signed)
Subjective:    Patient ID: Erik Lara, male    DOB: August 12, 1935, 82 y.o.   MRN: [id number]  HPI  Patient presents for yearly preventative medicine examination. He is a pleasant 82 year old male who  has a past medical history of Cysts, Diabetes mellitus ([place]), Gallstones, Hyperlipidemia, Obesity, and Prostatitis.  He takes metformin for history of diabetes. His readings at home have been in the 120-130's consistently  Lab Results  Component Value Date   HGBA1C 6.6 09/06/2016   He takes Zocor and aspirin for history of hyperlipidemia  He is taking Aricept 5 mg for cognitive impairment without diagnosis of dementia. He last saw neurology in January 2018 at which time he endorses that he thought Aricept is improving his memory. He continues to get lost while driving on occasion  All immunizations and health maintenance protocols were reviewed with the patient and needed orders were placed.  Appropriate screening laboratory values were ordered for the patient including screening of hyperlipidemia, renal function and hepatic function.  Medication reconciliation,  past medical history, social history, problem list and allergies were reviewed in detail with the patient  Goals were established with regard to weight loss, exercise, and  diet in compliance with medications  End of life planning was discussed. He has an advanced directive and living will  He has not been seen by his eye doctor but plans on doing so. He is also going to [employer] for hearing aids. He no longer needs a colonoscopy.   Review of Systems  Constitutional: Negative.   HENT: Positive for hearing loss.   Eyes: Negative.   Respiratory: Negative.   Cardiovascular: Negative.   Gastrointestinal: Negative.   Endocrine: Negative.   Genitourinary: Negative.   Musculoskeletal: Negative.   Skin: Negative.   Allergic/Immunologic: Negative.   Neurological: Negative.   Hematological: Negative.     Psychiatric/Behavioral: Negative.   All other systems reviewed and are negative.  Past Medical History:  Diagnosis Date  . Cysts    liver  . Diabetes mellitus ([place])   . Gallstones    asymptomatic  . Hyperlipidemia   . Obesity   . Prostatitis    requiring a trip to the ER in [place] with a fever - 09    Social History   Socioeconomic History  . Marital status: Married    Spouse name: Not on file  . Number of children: 3  . Years of education: Not on file  . Highest education level: Not on file  Social Needs  . Financial resource strain: Not on file  . Food insecurity - worry: Not on file  . Food insecurity - inability: Not on file  . Transportation needs - medical: Not on file  . Transportation needs - non-medical: Not on file  Occupational History  . Occupation: Retired  Tobacco Use  . Smoking status: Never Smoker  . Smokeless tobacco: Never Used  Substance and Sexual Activity  . Alcohol use: Yes    Alcohol/week: 0.0 oz    Comment: Beer weekly/glass of wine daily  . Drug use: No  . Sexual activity: Not on file  Other Topics Concern  . Not on file  Social History Narrative  . Not on file    Past Surgical History:  Procedure Laterality Date  . HERNIA REPAIR     Childhood    Family History  Problem Relation Age of Onset  . Cancer Other        breast  .  Diabetes Other     No Known Allergies  Current Outpatient Medications on File Prior to Visit  Medication Sig Dispense Refill  . aspirin 81 MG tablet Take 81 mg by mouth daily.    . cetirizine (ZYRTEC) 10 MG tablet Take 10 mg by mouth daily.    . Lancets (FREESTYLE) lancets USE ONCE DAILY, DX 250.OO 100 each 11  . metFORMIN (GLUCOPHAGE) 500 MG tablet TAKE 1/2 (ONE-HALF) TABLET BY MOUTH EVERY MORNING 45 tablet 4  . simvastatin (ZOCOR) 20 MG tablet TAKE 1 TABLET (20 MG TOTAL) BY MOUTH AT BEDTIME. 90 tablet 4   No current facility-administered medications on file prior to visit.     BP 120/70 (BP  Location: Left Arm)   Temp 98.4 F (36.9 C) (Oral)   Ht 6' (1.829 m)   Wt 226 lb (102.5 kg)   BMI 30.65 kg/m       Objective:   Physical Exam  Constitutional: He is oriented to person, place, and time. He appears well-developed and well-nourished. No distress.  Overweight    HENT:  Head: Normocephalic and atraumatic.  Right Ear: External ear normal.  Left Ear: External ear normal.  Nose: Nose normal.  Mouth/Throat: Oropharynx is clear and moist. No oropharyngeal exudate.  Eyes: Conjunctivae and EOM are normal. Pupils are equal, round, and reactive to light. Right eye exhibits no discharge. Left eye exhibits no discharge. No scleral icterus.  Neck: Normal range of motion. Neck supple. No JVD present. No tracheal deviation present. No thyromegaly present.  Cardiovascular: Normal rate, regular rhythm, normal heart sounds and intact distal pulses. Exam reveals no gallop and no friction rub.  No murmur heard. Pulmonary/Chest: Effort normal and breath sounds normal. No stridor. No respiratory distress. He has no wheezes. He has no rales. He exhibits no tenderness.  Abdominal: Soft. Bowel sounds are normal. He exhibits no distension and no mass. There is no tenderness. There is no rebound and no guarding.  Musculoskeletal: Normal range of motion. He exhibits no edema, tenderness or deformity.  Lymphadenopathy:    He has no cervical adenopathy.  Neurological: He is alert and oriented to person, place, and time. He has normal reflexes. He displays normal reflexes. No cranial nerve deficit. He exhibits normal muscle tone. Coordination normal.  Skin: Skin is warm and dry. No rash noted. He is not diaphoretic. No erythema. No pallor.  Psychiatric: He has a normal mood and affect. His behavior is normal. Judgment and thought content normal.  Nursing note and vitals reviewed.     Assessment & Plan:  1. Routine general medical examination at a health care facility - Continue to stay active and  eat healthy  - Follow up in one year or sooner if needed - Basic metabolic panel - CBC with Differential/Platelet - Hemoglobin A1c - Hepatic function panel - Lipid panel - TSH  2. Hyperlipidemia, unspecified hyperlipidemia type - Consider increase in statin - Basic metabolic panel - CBC with Differential/Platelet - Hemoglobin A1c - Hepatic function panel - Lipid panel - TSH  3. Mild cognitive impairment - Continue with Aricpet  - donepezil (ARICEPT) 5 MG tablet; Take 1 tablet (5 mg total) by mouth at bedtime.  Dispense: 90 tablet; Refill: 3  4. Type 2 diabetes mellitus without complication, without long-term current use of insulin (HCC) - Well controlled in the past. Consider increasing Metformin  - Follow up in 6 months  - Basic metabolic panel - CBC with Differential/Platelet - Hemoglobin A1c - Hepatic function panel -  Lipid panel - TSH   Dorothyann Peng, NP

## 2017-04-18 NOTE — Patient Instructions (Signed)
It was great seeing you today   I will follow up with you regarding your blood work   Continue to stay active and eat healthy   Please let me know if you need anything

## 2017-04-19 ENCOUNTER — Other Ambulatory Visit (INDEPENDENT_AMBULATORY_CARE_PROVIDER_SITE_OTHER): Payer: PPO

## 2017-04-19 DIAGNOSIS — R7989 Other specified abnormal findings of blood chemistry: Secondary | ICD-10-CM | POA: Diagnosis not present

## 2017-04-19 LAB — T4, FREE: Free T4: 0.94 ng/dL (ref 0.60–1.60)

## 2017-04-19 LAB — T3, FREE: T3 FREE: 3.5 pg/mL (ref 2.3–4.2)

## 2017-05-07 ENCOUNTER — Other Ambulatory Visit: Payer: Self-pay | Admitting: Family Medicine

## 2017-05-08 NOTE — Telephone Encounter (Signed)
Cory Pt. 

## 2017-05-09 NOTE — Telephone Encounter (Signed)
Sent to the pharmacy by e-scribe. 

## 2017-06-19 ENCOUNTER — Other Ambulatory Visit: Payer: Self-pay | Admitting: Family Medicine

## 2017-06-21 NOTE — Telephone Encounter (Signed)
Sent to the pharmacy by e-scribe. 

## 2017-07-26 ENCOUNTER — Ambulatory Visit: Payer: PPO | Admitting: Psychology

## 2017-07-26 ENCOUNTER — Encounter: Payer: Self-pay | Admitting: Psychology

## 2017-07-26 DIAGNOSIS — R413 Other amnesia: Secondary | ICD-10-CM

## 2017-07-26 DIAGNOSIS — G3184 Mild cognitive impairment, so stated: Secondary | ICD-10-CM | POA: Diagnosis not present

## 2017-07-26 NOTE — Progress Notes (Signed)
   Neuropsychology Note  Erik Lara completed 60 minutes of neuropsychological testing with technician, Milana Kidney, BS, under the supervision of Dr. Macarthur Critchley, Licensed Psychologist. The patient did not appear overtly distressed by the testing session, per behavioral observation or via self-report to the technician. Rest breaks were offered.   Clinical Decision Making: In considering the patient's current level of functioning, level of presumed impairment, nature of symptoms, emotional and behavioral responses during the interview, level of literacy, and observed level of motivation/effort, a battery of tests was selected and communicated to the psychometrician.  Communication between the psychologist and technician was ongoing throughout the testing session and changes were made as deemed necessary based on patient performance on testing, technician observations and additional pertinent factors such as those listed above.  Erik Lara will return within approximately 2 weeks for an interactive feedback session with Dr. Si Raider at which time his test performances, clinical impressions and treatment recommendations will be reviewed in detail. The patient understands he can contact our office should he require our assistance before this time.  35 minutes spent performing neuropsychological evaluation services/clinical decision making (psychologist). [CPT 88875] 60 minutes spent face-to-face with patient administering standardized tests, 30 minutes spent scoring (technician). [CPT [id number], 79728]  Full report to follow.

## 2017-07-26 NOTE — Progress Notes (Signed)
NEUROBEHAVIORAL STATUS EXAM   Name: Erik Lara Date of Birth: 1935-10-11 Date of Interview: 07/26/2017  Reason for Referral:  Erik Lara is a 82 y.o. male who is referred for neuropsychological re-evaluation by Erik Lara of [facility] due to concerns about worsening memory. This patient is accompanied in the office by his wife who supplements the history.  History of Presenting Problem [04/26/2016]:  Erik Lara first saw Erik Lara inJanuary 2017 for memory complaints. MMSE at that time was 28/30. Brain MRI on 04/14/2015 revealednothing acute, moderate cerebral atrophy, and minimal chronic small vessel ischemic disease. He followed up with Erik Lara on 02/2016 and reported that he continues to function independently but is getting lost briefly 1-2 times a week when driving.  At today's appointment he explains that when he is driving, he will have a brief moment in which he feels he does not know exactly how to get to his destination. However, he has never actually gotten lost. He states that he is moresohaving doubts about routes than forgetting routes. His wife mentions that he never did have a good sense of direction.  Upon direct questioning, the patientreported that he does not have a good memory but he never did. He has always compensated by writing things down. He even recalls having trouble recalling information as a child, but if he went and did something else, he would be able to recall the information. His wife states he has always repeated himself and this is nothing new. He does misplace/lose things "for very short intervals". He does not miss appointments. He does not miss medications. He does not forget recent conversations or events. He denies difficulty concentrating. He does endorse starting but not finishing tasks, distractibility, and some word finding difficulty.  He continues to manage all instrumental ADLs including driving, medications,  finances, appointments.  He goes to an exercise class 3x/week. He helps his wife with gardening and other household projects. He has a history of one fall in the past year.  His mood is good, stable. He does admit having some stress related to his children/grandchildren. He denies any difficulties with sleep or appetite. His wife denies any change in mood or personality.  Psychiatric history was denied. He has never been treated for a mental health condition.  When asked about family history of memory loss, he noted that he suspects his mother had some dementia; his wife thinks she was depressed.  Previous neurocognitive evaluation results [05/24/2016]: Nonamnestic mild cognitive impairment (MCI). Results of the current cognitive evaluation are largely within normal limits, with most areas of function consistent with estimated premorbid intellectual abilities. However, there are a few areas of impairment suggestive of mild fronto-subcortical dysfunction (e.g., impaired verbal fluency, impaired mental flexibility/set-shifting). His testing results do not warrant a diagnosis of dementia; however, a diagnosis mild cognitive impairment is appropriate at this time. In terms of etiology, I am surprised that he did not have more pronounced signs of vascular damage on neuroimaging, because his testing results are commonly associated with subcortical chronic microvascular ischemia. It is possible that there is a longstanding history of attention/executive functioning difficulties (such as ADHD) based on his self-report. I do not suspect underlying Alzheimer's disease based on his current testing profile. There is no evidence of significant depression but he does have some mild generalized anxiety.    Interim History and Current Functioning [07/26/2017]:  Erik Lara saw his PCP in 01/2017 and reported concerns about worsening memory loss. He was  prescribed Aricept 5 mg and has tolerated this well. At follow  up with Erik Lara in 03/2017, MMSE was 29/30.   The patient and his wife are unsure if there has been cognitive decline since his last evaluation with me a year ago. He reports there are moments when he feels his cognition is better and then moments when he feels it is "back-tracking", and he does not know what might contribute to this variability. He continues to complain of short term memory loss, and when he describes the symptoms it sounds more like attention difficulty. He notices he writes things down more. He forgets where he placed his smart phone (but can find it pretty quickly). He continues to experience difficulties navigating while driving. He does not get lost but it takes him longer to think of where he needs to turn. When this happens he just slows down and gives himself time, and once he sees the turn he knows it is right and he knows the rest of the route. He has made some wrong turns but is able to self correct.   He continues to manage all instrumental ADLs including driving, finances, medications, and appointments. He denies any changes in his ability to manage these tasks. He denies forgetting to pay bills, take medications. He denies missing appointments.   He denies any significant physical/medical changes. He has had one fall in the past year which occurred recently while working in the garden. He got his feet tangled in a vine. He did not injure himself or hit his head.   He thinks he may have some hearing loss and is planning to get an audiology evaluation.   Mood is relatively stable. His wife thinks he may be slightly more irritable. He does endorse frustration surrounding age related changes/limitations. He denies depressed mood. He denies suicidal ideation or intention. He denies any sleep difficulty.    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 He had a 6 year career in the [employer]. 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 He retired at least 10 years ago, but he continued to do some part time jobs to stay busy/active. Marital history: Married x54 years with 3 children and5 grandchildren. Alcohol/Tobacco/Substances: 1 beer or 1 glass of wine almost daily. If he goes to a party,2-3 drinks. Never a smoker. No SA.   Medical History: Past Medical History:  Diagnosis Date  . Cysts    liver  . Diabetes mellitus ([place])   . Gallstones    asymptomatic  . Hyperlipidemia   . Obesity   . Prostatitis    requiring a trip to the ER in [place] with a fever - 09     Current Medications:  Outpatient Encounter Medications as of 07/26/2017  Medication Sig  . aspirin 81 MG tablet Take 81 mg by mouth daily.  . cetirizine (ZYRTEC) 10 MG tablet Take 10 mg by mouth daily.  Marland Kitchen donepezil (ARICEPT) 5 MG tablet Take 1 tablet (5 mg total) by mouth at bedtime.  . Lancets (FREESTYLE) lancets USE ONCE DAILY, DX 250.OO  . metFORMIN (GLUCOPHAGE) 500 MG tablet TAKE 1/2 (ONE-HALF) TABLET BY MOUTH EVERY MORNING  . simvastatin (ZOCOR) 20 MG tablet TAKE 1 TABLET (20 MG TOTAL) BY MOUTH  AT BEDTIME.   No facility-administered encounter medications on file as of 07/26/2017.      Behavioral Observations:   Appearance: Neatly and appropriately dressed and groomed Gait: Ambulated independently, no gross abnormalities observed Speech: Fluent; mildly increased response latencies, mildly decreased rate. Normal rhythm. No significant word finding difficulty. Thought process: Generally linear and goal directed (mildly tangential at times) Affect: Full, euthymic Interpersonal: Pleasant, appropriate   40 minutes spent face-to-face with patient completing neurobehavioral status exam. 40 minutes spent integrating medical records/clinical data  and completing this report. CPT code [phone].   TESTING: There is medical necessity to proceed with neuropsychological assessment as the results will be used to aid in differential diagnosis and clinical decision-making and to inform specific treatment recommendations. Per the patient, his wife and medical records reviewed, there has been possible worsening of cognition and a need to rule out progression of MCI to dementia.  Clinical Decision Making: In considering the patient's current level of functioning, level of presumed impairment, nature of symptoms, emotional and behavioral responses during the interview, level of literacy, and observed level of motivation, a battery of tests was selected and communicated to the psychometrician.   Following the clinical interview/neurobehavioral status exam, the patient completed this full battery of neuropsychological testing with my psychometrician under my supervision (see separate note).   PLAN: The patient will return to see me for a follow-up session at which time his test performances and my impressions and treatment recommendations will be reviewed in detail.  Evaluation ongoing; full report to follow.

## 2017-07-27 ENCOUNTER — Encounter: Payer: PPO | Admitting: Psychology

## 2017-08-18 ENCOUNTER — Other Ambulatory Visit: Payer: Self-pay | Admitting: Family Medicine

## 2017-08-30 NOTE — Progress Notes (Signed)
NEUROPSYCHOLOGICAL EVALUATION   Name:    Erik Lara  Date of Birth:   Aug 23, 1935 Date of Interview:  07/26/2017 Date of Testing:  07/26/2017   Date of Feedback:  08/31/2017       Background Information:  Reason for Referral:  Erik Lara is a 82 y.o. male referred by Dr. Ellouise Newer to assess his current level of cognitive functioning and assist in differential diagnosis. The current evaluation consisted of a review of available medical records, an interview with the patient and his wife, and the completion of a neuropsychological testing battery. Informed consent was obtained.   History of Presenting Problem [04/26/2016]:  Mr. Erik Lara first saw Dr.Aquino inJanuary 2017 for memory complaints. MMSE at that time was 28/30. Brain MRI on 04/14/2015 revealednothing acute, moderate cerebral atrophy, and minimal chronic small vessel ischemic disease. He followed up with Dr. Delice Lesch on 02/2016 and reported that he continues to function independently but is getting lost briefly 1-2 times a week when driving.  At today's appointment he explains that when he is driving, he will have a brief moment in which he feels he does not know exactly how to get to his destination. However, he has never actually gotten lost. He states that he is moresohaving doubts about routes than forgetting routes. His wife mentions that he never did have a good sense of direction.  Upon direct questioning, the patientreported that he does not have a good memory but he never did. He has always compensated by writing things down. He even recalls having trouble recalling information as a child, but if he went and did something else, he would be able to recall the information. His wife states he has always repeated himself and this is nothing new. He does misplace/lose things "for very short intervals". He does not miss appointments. He does not miss medications. He does not forget recent conversations or events. He  denies difficulty concentrating. He does endorse starting but not finishing tasks, distractibility, and some word finding difficulty.  He continues to manage all instrumental ADLs including driving, medications, finances, appointments.  He goes to an exercise class 3x/week. He helps his wife with gardening and other household projects. He has a history of one fall in the past year.  His mood is good, stable. He does admit having some stress related to his children/grandchildren. He denies any difficulties with sleep or appetite. His wife denies any change in mood or personality.  Psychiatric history was denied. He has never been treated for a mental health condition.  When asked about family history of memory loss, he noted that he suspects his mother had some dementia; his wife thinks she was depressed.  Previous neurocognitive evaluation results [05/24/2016]:Nonamnestic mild cognitive impairment (MCI). Results of the current cognitive evaluation are largely within normal limits, with most areas of function consistent with estimated premorbid intellectual abilities. However, there are a few areas of impairment suggestive of mild fronto-subcortical dysfunction (e.g., impaired verbal fluency, impairedmental flexibility/set-shifting).Histesting results do not warrant a diagnosis of dementia; however, a diagnosis mild cognitive impairment is appropriate at this time.In terms of etiology, I am surprised that he did not have more pronounced signs of vascular damage on neuroimaging, because his testing results are commonly associated with subcortical chronic microvascular ischemia. It is possible that there is a longstanding history of attention/executive functioning difficulties (such as ADHD) based on his self-report. I do not suspect underlying Alzheimer's disease based on his current testing profile. There is no  evidence of significant depression but he does have some mild generalized  anxiety.    Interim History and Current Functioning [07/26/2017]:  Mr. Erik Lara saw his PCP in 01/2017 and reported concerns about worsening memory loss. He was prescribed Aricept 5 mg and has tolerated this well. At follow up with Dr. Delice Lesch in 03/2017, MMSE was 29/30.   The patient and his wife are unsure if there has been cognitive decline since his last evaluation with me a year ago. He reports there are moments when he feels his cognition is better and then moments when he feels it is "back-tracking", and he does not know what might contribute to this variability. He continues to complain of short term memory loss, and when he describes the symptoms it sounds more like attention difficulty. He notices he writes things down more. He forgets where he placed his smart phone (but can find it pretty quickly). He continues to experience difficulties navigating while driving. He does not get lost but it takes him longer to think of where he needs to turn. When this happens he just slows down and gives himself time, and once he sees the turn he knows it is right and he knows the rest of the route. He has made some wrong turns but is able to self correct.   He continues to manage all instrumental ADLs including driving, finances, medications, and appointments. He denies any changes in his ability to manage these tasks. He denies forgetting to pay bills, take medications. He denies missing appointments.   He denies any significant physical/medical changes. He has had one fall in the past year which occurred recently while working in the garden. He got his feet tangled in a vine. He did not injure himself or hit his head.   He thinks he may have some hearing loss and is planning to get an audiology evaluation.   Mood is relatively stable. His wife thinks he may be slightly more irritable. He does endorse frustration surrounding age related changes/limitations. He denies depressed mood. He denies  suicidal ideation or intention. He denies any sleep difficulty.    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 He had a 6 year career in the [employer]. 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 He retired at least 10 years ago, but he continued to do some part time jobs to stay busy/active. Marital history: Married x54 years with 3 children and5 grandchildren. Alcohol/Tobacco/Substances: 1 beer or 1 glass of wine almost daily. Ifhe goesto a party,2-3 drinks. Never a smoker. No SA.    Medical History:  Past Medical History:  Diagnosis Date  . Cysts    liver  . Diabetes mellitus ([place])   . Gallstones    asymptomatic  . Hyperlipidemia   . Obesity   . Prostatitis    requiring a trip to the ER in [place] with a fever - 09    Current medications:  Outpatient Encounter Medications as of 08/31/2017  Medication Sig  . aspirin 81 MG tablet Take 81 mg by mouth daily.  . cetirizine (ZYRTEC) 10 MG tablet Take 10 mg by mouth daily.  Marland Kitchen donepezil (ARICEPT) 5 MG tablet Take 1 tablet (5 mg total) by mouth at bedtime.  . Lancets (  FREESTYLE) lancets USE ONCE DAILY  . metFORMIN (GLUCOPHAGE) 500 MG tablet TAKE 1/2 (ONE-HALF) TABLET BY MOUTH EVERY MORNING  . simvastatin (ZOCOR) 20 MG tablet TAKE 1 TABLET (20 MG TOTAL) BY MOUTH AT BEDTIME.   No facility-administered encounter medications on file as of 08/31/2017.      Current Examination:  Behavioral Observations:  Appearance: Neatly and appropriately dressed and groomed Gait: Ambulated independently, no gross abnormalities observed Speech: Fluent; mildly increased response latencies, mildly decreased rate. Normal rhythm. No significant word finding difficulty. Thought process: Generally linear and  goal directed (mildly tangential at times) Affect: Full, euthymic Interpersonal: Pleasant, appropriate Orientation: Oriented to all spheres. Accurately named the current President and his predecessor.   Tests Administered: . Wechsler Adult Intelligence Scale-Fourth Edition (WAIS-IV): Similarities, Music therapist, Coding and Digit Span subtests . Wechsler Memory Scale-Fourth Edition (WMS-IV) Older Adult Version (ages 77-90): Logical Memory I, II and Recognition subtests  . [employer] Verbal Learning Test - 2nd Edition (CVLT-2) Short Form . Repeatable Battery for the Assessment of Neuropsychological Status (RBANS) Form A:  Figure Copy and Recall subtests and Semantic Fluency subtest . Neuropsychological Assessment Battery (NAB) Language Module, Form 1: Naming subtest . Boston Diagnostic Aphasia Examination: Complex Ideational Material subtest . Controlled Oral Word Association Test (COWAT) . Trail Making Test A and B . Clock drawing test . Geriatric Depression Scale (GDS) 15 Item . Generalized Anxiety Disorder - 7 item screener (GAD-7)  Test Results: Note: Standardized scores are presented only for use by appropriately trained professionals and to allow for any future test-retest comparison. These scores should not be interpreted without consideration of all the information that is contained in the rest of the report. The most recent standardization samples from the test publisher or other sources were used whenever possible to derive standard scores; scores were corrected for age, gender, ethnicity and education when available.   Test Scores:  Test Name Raw Score Standardized Score Descriptor  WAIS-IV Subtests     Similarities 25/36 ss= 12 High average  Block Design 37/66 ss= 13 High average  Coding 36/135 ss= 9 Average  Digit Span Forward 8/16 ss= 8 Average  Digit Span Backward 4/16 ss= 6 Low average  WMS-IV Subtests     LM I 22/53 ss= 8 Average  LM II 10/39 ss= 9 Average  LM II  Recognition 18/23 Cum %: 51-75 WNL  RBANS Subtests     Figure Copy 20/20 Z= 1.4 Superior  Figure Recall 11/20 Z= -0.1 Average   Semantic Fluency 6/40 Z= -3.1 Severely impaired  CVLT-II Scores     Trial 1 3/9 Z= -2 Impaired  Trial 4 6/9 Z= -0.5 Average  Trials 1-4 total 19/36 T= 47 Average  SD Free Recall 5/9 Z= -0.5 Average  LD Free Recall 5/9 Z= 0 Average  LD Cued Recall 5/9 Z= 0 Average  Recognition Discriminability 9/9 hits 0 false positives Z= 1.5 Superior  Forced Choice Recognition 9/9  WNL  NAB Naming 31/31 T= 63 Superior  BDAE Complex Ideational Material 8/12  Impaired  COWAT-FAS 16 T= 31 Borderline  COWAT-Animals 6 T= 26 Impaired  Trail Making Test A  66" 1 error T= 48 Average  Trail Making Test B  227" 2 errors T= 41 Low average  Clock Drawing   WNL  GDS-15 0/15  WNL  GAD-7 2/21  WNL      Description of Test Results:  Premorbid verbal intellectual abilities were estimated to have been within the average range based on a test  of word reading. Psychomotor processing speed was average (stable from 2018). Basic auditory attention was average (stable), and more complex auditory attention (ie working memory) was low average (mildly reduced from 2018). Visual-spatial construction was high average (improved from 2018). Language abilities were variable. Specifically, confrontation naming was superior (stable), while semantic verbal fluency was impaired (reduced from 2018). Auditory comprehension of complex ideational material was impaired. With regard to verbal memory, encoding and acquisition of non-contextual information (i.e., word list) was average (stable). After a brief distracter task, free recall was average (stable). After a delay, free recall was average (stable). Cued recall was average (stable). Performance on a yes/no recognition task was superior (improved relative to 2018). On another verbal memory test, encoding and acquisition of contextual auditory information  (i.e., short stories) was average (stable). After a delay, free recall was average (stable). Performance on a yes/no recognition task was within normal limits (stable). With regard to non-verbal memory, delayed free recall of visual information was average (stable). Executive functioning was stable to improved relative to 2018. Mental flexibility and set-shifting were low average on Trails B (significantly improved relative to 2018). Verbal fluency with phonemic search restrictions was borderline (stable). Verbal abstract reasoning was high average (improved). Performance on a clock drawing task was normal (stable). On a self-report measure of mood, the patient's responses were not indicative of clinically significant depression at the present time. On a self-report measure of anxiety, the patient did not endorse clinically significant generalized anxiety at the present time (improved relative to 2018).    Clinical Impressions: Non-amnestic MCI (stable relative to 2018 evaluation). Results of the current cognitive evaluation, when compared to prior performances in 05/2016, are quite reassuring. He demonstrated mostly stable scores, with some improved performances. He continues to demonstrate relative weakness in verbal fluency, and he demonstrated difficulty with auditory comprehension of complex ideational material. His results continue to support a diagnosis of non-amnestic MCI (not dementia). There is no evidence of an underlying memory disorder or Alzheimer's disease. Finally, he is reporting improved anxiety level compared to last year.   Recommendations/Plan: Based on the findings of the present evaluation, the following recommendations are offered:  1. The patient was reassured that his test results did not show any decline and that there is no evidence of dementia. Strategies to maintain brain health were again reviewed. 2. Neuropsychological re-evaluation could be considered in the future if there  are concerns about decline/change in cognitive function at any point.    Feedback to Patient: KERON NEENAN and his wife returned for a feedback appointment on 08/31/2017 to review the results of his neuropsychological evaluation with this provider. 20 minutes face-to-face time was spent reviewing his test results, my impressions and my recommendations as detailed above.    Total time spent on this patient's case: 80 minutes for neurobehavioral status exam with psychologist (CPT code [phone]); 90 minutes of testing/scoring by psychometrician under psychologist's supervision (CPT codes [phone], [phone] units); 180 minutes for integration of patient data, interpretation of standardized test results and clinical data, clinical decision making, treatment planning and preparation of this report, and interactive feedback with review of results to the patient/family by psychologist (CPT codes [phone], [phone] units).      Thank you for your referral of JGUADALUPE OPIELA. Please feel free to contact me if you have any questions or concerns regarding this report.

## 2017-08-31 ENCOUNTER — Ambulatory Visit (INDEPENDENT_AMBULATORY_CARE_PROVIDER_SITE_OTHER): Payer: PPO | Admitting: Psychology

## 2017-08-31 ENCOUNTER — Encounter: Payer: Self-pay | Admitting: Psychology

## 2017-08-31 DIAGNOSIS — R413 Other amnesia: Secondary | ICD-10-CM

## 2017-08-31 DIAGNOSIS — G3184 Mild cognitive impairment, so stated: Secondary | ICD-10-CM

## 2017-09-08 ENCOUNTER — Encounter: Payer: Self-pay | Admitting: Adult Health

## 2017-09-12 ENCOUNTER — Telehealth: Payer: Self-pay | Admitting: Neurology

## 2017-09-12 ENCOUNTER — Telehealth: Payer: Self-pay | Admitting: *Deleted

## 2017-09-12 NOTE — Telephone Encounter (Signed)
Pt is received an overdue Health Maintenance task on her Mychart Account for Urine Microalbumin Last tested 2018 Before calling the patient back to explain what this is for, is this something that Dr Sherren Mocha would want repeated this year?   Copied from [place] [id number]. Topic: General - Other >> Sep 12, 2017  3:37 PM Synthia Innocent wrote: Reason for CRM: Urine protein order is over due, was due on  03/28/17 per mychart. Would like to know what this is for. Please advise

## 2017-09-12 NOTE — Telephone Encounter (Signed)
Patient wants to talk to someone to see if this appt is really needed. He had appt on 09-15-17 (yearly follow up )  and had to resch  To Apr 27 2018.

## 2017-09-13 NOTE — Telephone Encounter (Signed)
Called pt voiced understanding that the test for microalbumin is to test for any signs of kidney disease and that Dr Sherren Mocha has been checking for this Annually, pt is aware that this test is done at the annual physical or could have him make an  appointment with lab for the test. Pt states that he will wait for next annual visit to have the test done.

## 2017-09-15 ENCOUNTER — Ambulatory Visit: Payer: Medicare PPO | Admitting: Neurology

## 2017-09-18 ENCOUNTER — Emergency Department (HOSPITAL_COMMUNITY): Payer: PPO

## 2017-09-18 ENCOUNTER — Emergency Department (HOSPITAL_COMMUNITY)
Admission: EM | Admit: 2017-09-18 | Discharge: 2017-09-19 | Disposition: A | Discharge status: Home / Self Care | Payer: PPO | Attending: Emergency Medicine | Admitting: Emergency Medicine

## 2017-09-18 ENCOUNTER — Encounter (HOSPITAL_COMMUNITY): Payer: Self-pay | Admitting: Emergency Medicine

## 2017-09-18 ENCOUNTER — Other Ambulatory Visit: Payer: Self-pay

## 2017-09-18 DIAGNOSIS — E119 Type 2 diabetes mellitus without complications: Secondary | ICD-10-CM | POA: Insufficient documentation

## 2017-09-18 DIAGNOSIS — E039 Hypothyroidism, unspecified: Secondary | ICD-10-CM | POA: Diagnosis not present

## 2017-09-18 DIAGNOSIS — S0993XA Unspecified injury of face, initial encounter: Secondary | ICD-10-CM | POA: Diagnosis present

## 2017-09-18 DIAGNOSIS — Y929 Unspecified place or not applicable: Secondary | ICD-10-CM | POA: Insufficient documentation

## 2017-09-18 DIAGNOSIS — Z7982 Long term (current) use of aspirin: Secondary | ICD-10-CM | POA: Insufficient documentation

## 2017-09-18 DIAGNOSIS — S199XXA Unspecified injury of neck, initial encounter: Secondary | ICD-10-CM | POA: Diagnosis not present

## 2017-09-18 DIAGNOSIS — S60222A Contusion of left hand, initial encounter: Secondary | ICD-10-CM

## 2017-09-18 DIAGNOSIS — Z7984 Long term (current) use of oral hypoglycemic drugs: Secondary | ICD-10-CM | POA: Insufficient documentation

## 2017-09-18 DIAGNOSIS — S0181XA Laceration without foreign body of other part of head, initial encounter: Secondary | ICD-10-CM | POA: Diagnosis not present

## 2017-09-18 DIAGNOSIS — Y9389 Activity, other specified: Secondary | ICD-10-CM | POA: Diagnosis not present

## 2017-09-18 DIAGNOSIS — W109XXA Fall (on) (from) unspecified stairs and steps, initial encounter: Secondary | ICD-10-CM | POA: Diagnosis not present

## 2017-09-18 DIAGNOSIS — S61012A Laceration without foreign body of left thumb without damage to nail, initial encounter: Secondary | ICD-10-CM | POA: Insufficient documentation

## 2017-09-18 DIAGNOSIS — S61022A Laceration with foreign body of left thumb without damage to nail, initial encounter: Secondary | ICD-10-CM | POA: Diagnosis not present

## 2017-09-18 DIAGNOSIS — S0990XA Unspecified injury of head, initial encounter: Secondary | ICD-10-CM | POA: Diagnosis not present

## 2017-09-18 DIAGNOSIS — Y999 Unspecified external cause status: Secondary | ICD-10-CM | POA: Insufficient documentation

## 2017-09-18 DIAGNOSIS — S61412A Laceration without foreign body of left hand, initial encounter: Secondary | ICD-10-CM | POA: Diagnosis not present

## 2017-09-18 DIAGNOSIS — Z79899 Other long term (current) drug therapy: Secondary | ICD-10-CM | POA: Insufficient documentation

## 2017-09-18 DIAGNOSIS — F0390 Unspecified dementia without behavioral disturbance: Secondary | ICD-10-CM | POA: Diagnosis not present

## 2017-09-18 DIAGNOSIS — W19XXXA Unspecified fall, initial encounter: Secondary | ICD-10-CM

## 2017-09-18 MED ORDER — LIDOCAINE HCL 2 % IJ SOLN
20.0000 mL | Freq: Once | INTRAMUSCULAR | Status: DC
  Filled 2017-09-18: qty 20

## 2017-09-18 NOTE — ED Provider Notes (Signed)
[facility] DEPT Provider Note   CSN: [id number] Arrival date & time: 09/18/17  2008     History   Chief Complaint Chief Complaint  Patient presents with  . Fall    HPI Erik Lara is a 82 y.o. male with a h/o of DM, mild cognitive impairment, and HLD who presents to the emergency department from home with a chief complaint of fall.  The patient reports that he slipped and fell down approximately 10-15 carpeted stairs around 1930. He suspects that he cut his left hand on a piece of glass from a broken bottle of beer that he was carrying prior to the fall.  There is a large area of swelling to the dorsum of the left hand. He also reports a laceration to the mid forehead.  He is unsure what he hit his head on.  He was able to stand and has been ambulatory since the fall.  He denies LOC, nausea, emesis, visual changes, numbness, weakness, dyspnea, chest pain, neck, back, or hip pain.   He denies alcohol use prior to the fall. Tdap was updated in 2015.  He takes a baby aspirin daily, but no other blood thinners.  The history is provided by the patient.  Fall  Pertinent negatives include no chest pain, no abdominal pain, no headaches and no shortness of breath.    Past Medical History:  Diagnosis Date  . Cysts    liver  . Diabetes mellitus ([place])   . Gallstones    asymptomatic  . Hyperlipidemia   . Obesity   . Prostatitis    requiring a trip to the ER in [place] with a fever - 09    Patient Active Problem List   Diagnosis Date Noted  . Hearing loss of right ear 01/23/2017  . Mild cognitive impairment 09/13/2016  . Type 2 diabetes mellitus without complication, without long-term current use of insulin ([place]) 03/30/2015  . Short-term memory loss 02/03/2015  . H/O hyperthyroidism 01/28/2013  . ERECTILE DYSFUNCTION, MILD 02/19/2008  . Allergic rhinitis 02/19/2008  . Hyperlipidemia 02/15/2007  . HEPATIC CYST 02/15/2007    Past Surgical  History:  Procedure Laterality Date  . HERNIA REPAIR     Childhood        Home Medications    Prior to Admission medications   Medication Sig Start Date End Date Taking? Authorizing Provider  aspirin 81 MG tablet Take 81 mg by mouth daily.   Yes [provider]  cetirizine (ZYRTEC) 10 MG tablet Take 10 mg by mouth daily.   Yes [provider]  donepezil (ARICEPT) 5 MG tablet Take 1 tablet (5 mg total) by mouth at bedtime. 04/18/17  Yes Nafziger, Tommi Rumps, NP  metFORMIN (GLUCOPHAGE) 500 MG tablet TAKE 1/2 (ONE-HALF) TABLET BY MOUTH EVERY MORNING 06/21/17  Yes Nafziger, Tommi Rumps, NP  simvastatin (ZOCOR) 20 MG tablet TAKE 1 TABLET (20 MG TOTAL) BY MOUTH AT BEDTIME. 05/09/17  Yes Nafziger, Tommi Rumps, NP  cephALEXin (KEFLEX) 500 MG capsule Take 1 capsule (500 mg total) by mouth 4 (four) times daily for 5 days. 09/19/17 09/24/17  Josette Shimabukuro A, PA-C  Lancets (FREESTYLE) lancets USE ONCE DAILY 08/18/17   Dorena Cookey, MD    Family History Family History  Problem Relation Age of Onset  . Cancer Other        breast  . Diabetes Other     Social History Social History   Tobacco Use  . Smoking status: Never Smoker  .  Smokeless tobacco: Never Used  Substance Use Topics  . Alcohol use: Yes    Alcohol/week: 0.0 oz    Comment: Beer weekly/glass of wine daily  . Drug use: No     Allergies   Patient has no known allergies.   Review of Systems Review of Systems  Constitutional: Negative for appetite change, chills and fever.  Eyes: Negative for visual disturbance.  Respiratory: Negative for shortness of breath.   Cardiovascular: Negative for chest pain.  Gastrointestinal: Negative for abdominal pain, blood in stool, nausea and vomiting.  Genitourinary: Negative for dysuria and hematuria.  Musculoskeletal: Positive for joint swelling. Negative for arthralgias, back pain, gait problem, myalgias, neck pain and neck stiffness.  Skin: Positive for color change and wound. Negative  for rash.  Allergic/Immunologic: Negative for immunocompromised state.  Neurological: Negative for dizziness, seizures, syncope, speech difficulty, weakness, light-headedness, numbness and headaches.  Psychiatric/Behavioral: Negative for confusion.   Physical Exam Updated Vital Signs BP (!) 153/86 (BP Location: Right Arm)   Pulse 69   Temp 98.2 F (36.8 C) (Oral)   Resp 14   Ht 6\' 2"  (1.88 m)   Wt 104.3 kg (230 lb)   SpO2 97%   BMI 29.53 kg/m   Physical Exam  Constitutional: He is oriented to person, place, and time. He appears well-developed.  HENT:  Head: Normocephalic.  There is a jagged 4 cm superficial laceration that extends vertically across the mid forehead.  Mild oozing when pressure is not applied.  Bleeding is controlled with pressure.  No obvious foreign bodies.  Wound appears clean.  Eyes: Pupils are equal, round, and reactive to light. Conjunctivae and EOM are normal.  Neck: Normal range of motion. Neck supple.  Full active range of motion with flexion, extension, lateral flexion, and rotation.  Cardiovascular: Normal rate, regular rhythm, normal heart sounds and intact distal pulses. Exam reveals no gallop and no friction rub.  No murmur heard. Pulmonary/Chest: Effort normal and breath sounds normal. No stridor. No respiratory distress. He has no wheezes. He has no rales. He exhibits no tenderness.  No tenderness to palpation to the bilateral ribs.  Abdominal: Soft. Bowel sounds are normal. He exhibits no distension and no mass. There is no tenderness. There is no rebound and no guarding. No hernia.  Abdomen is soft, nontender, nondistended.  Musculoskeletal: He exhibits no edema, tenderness or deformity.  No tenderness to the spinous processes of the cervical, thoracic, or lumbar spine or surrounding bilateral paraspinal muscles.  Neurological: He is alert and oriented to person, place, and time.  GCS 15.  Alert and oriented x3.  Cranial nerves II through XII are  grossly intact.  Finger-to-nose is intact bilaterally.  Symmetric tandem gait.  5 out of 5 strength against resistance of the bilateral upper and lower extremities.  Sensation is intact and symmetric throughout.  Skin: Skin is warm and dry. Capillary refill takes less than 2 seconds.  There is a 1.5 cm jagged superficial laceration at the base of the left thumb on the palmar aspect.  The skin does not reapproximate well with a decent sized portion of subcutaneous tissue visible at the site of the wound.  No obvious foreign bodies.  The wound does not appear contaminated.  Full active and passive range of motion of all digits of the left hand.  Good strength against resistance of the left thumb.  Capillary refill is less than 2 seconds.  Sensation is intact along all 4 distal aspects of all digits of  the left hand.  Hematoma noted to the dorsum of the left hand, over the second MCP.  There is a superficial, hemostatic abrasion to the dorsum of the left hand.  Edema is noted to the left second digit.  Tender to palpation to the second proximal phalanx.  No obvious deformities or crepitus.  Full active and passive range of motion of the left wrist.  Decreased range of motion of the second left digit secondary to swelling.  Good capillary refill of all digits of the left hand.  Sensation is intact to the fourth distal aspects of the left second digit.  Good strength against resistance.  Psychiatric: His behavior is normal.  Nursing note and vitals reviewed.    ED Treatments / Results  Labs (all labs ordered are listed, but only abnormal results are displayed) Labs Reviewed - No data to display  EKG None  Radiology Ct Head Wo Contrast  Result Date: 09/18/2017 CLINICAL DATA:  82 y/o M; fall down stairs. Injury to the forehead. EXAM: CT HEAD WITHOUT CONTRAST CT CERVICAL SPINE WITHOUT CONTRAST TECHNIQUE: Multidetector CT imaging of the head and cervical spine was performed following the standard protocol  without intravenous contrast. Multiplanar CT image reconstructions of the cervical spine were also generated. COMPARISON:  04/14/2015 MRI of the head. 01/31/2005 thyroid ultrasound. FINDINGS: CT HEAD FINDINGS Brain: No evidence of acute infarction, hemorrhage, hydrocephalus, extra-axial collection or mass lesion/mass effect. Mild chronic microvascular ischemic changes and parenchymal volume loss of the brain. Vascular: Calcific atherosclerosis of carotid siphons. No hyperdense vessel identified. Skull: Normal. Negative for fracture or focal lesion. Sinuses/Orbits: Frontal scalp contusion and laceration. Other: None. CT CERVICAL SPINE FINDINGS Alignment: Normal. Skull base and vertebrae: No acute fracture. No primary bone lesion or focal pathologic process. Soft tissues and spinal canal: No prevertebral fluid or swelling. No visible canal hematoma. Disc levels: Cervical spondylosis with multilevel disc and facet degenerative changes. No high-grade bony canal stenosis. Uncovertebral and facet hypertrophy on the right encroaches on the neural foramen at the C3 through C7 levels and on the left at C4-C7. Upper chest: Negative. Other: Mass posterior to the left lobe of the thyroid measuring 24 x 20 x 34 mm (AP x ML x CC series 9, image 86 and series 12, image 8) within the left tracheoesophageal groove (series 9, image 86). IMPRESSION: CT head: 1. Frontal scalp contusion and laceration. 2. No acute intracranial abnormality or calvarial fracture. 3. Mild chronic microvascular ischemic changes and parenchymal volume loss of the brain for age. CT cervical spine: 1. No acute fracture or dislocation. 2. Mass posterior to left lobe of the thyroid measuring up to 34 mm. Differential includes thyroid nodule, parathyroid adenoma, or lymphadenopathy. Thyroid ultrasound is recommended on a nonemergent basis. Electronically Signed   By: Kristine Garbe M.D.   On: 09/18/2017 23:00   Ct Cervical Spine Wo Contrast  Result  Date: 09/18/2017 CLINICAL DATA:  82 y/o M; fall down stairs. Injury to the forehead. EXAM: CT HEAD WITHOUT CONTRAST CT CERVICAL SPINE WITHOUT CONTRAST TECHNIQUE: Multidetector CT imaging of the head and cervical spine was performed following the standard protocol without intravenous contrast. Multiplanar CT image reconstructions of the cervical spine were also generated. COMPARISON:  04/14/2015 MRI of the head. 01/31/2005 thyroid ultrasound. FINDINGS: CT HEAD FINDINGS Brain: No evidence of acute infarction, hemorrhage, hydrocephalus, extra-axial collection or mass lesion/mass effect. Mild chronic microvascular ischemic changes and parenchymal volume loss of the brain. Vascular: Calcific atherosclerosis of carotid siphons. No hyperdense vessel identified.  Skull: Normal. Negative for fracture or focal lesion. Sinuses/Orbits: Frontal scalp contusion and laceration. Other: None. CT CERVICAL SPINE FINDINGS Alignment: Normal. Skull base and vertebrae: No acute fracture. No primary bone lesion or focal pathologic process. Soft tissues and spinal canal: No prevertebral fluid or swelling. No visible canal hematoma. Disc levels: Cervical spondylosis with multilevel disc and facet degenerative changes. No high-grade bony canal stenosis. Uncovertebral and facet hypertrophy on the right encroaches on the neural foramen at the C3 through C7 levels and on the left at C4-C7. Upper chest: Negative. Other: Mass posterior to the left lobe of the thyroid measuring 24 x 20 x 34 mm (AP x ML x CC series 9, image 86 and series 12, image 8) within the left tracheoesophageal groove (series 9, image 86). IMPRESSION: CT head: 1. Frontal scalp contusion and laceration. 2. No acute intracranial abnormality or calvarial fracture. 3. Mild chronic microvascular ischemic changes and parenchymal volume loss of the brain for age. CT cervical spine: 1. No acute fracture or dislocation. 2. Mass posterior to left lobe of the thyroid measuring up to 34 mm.  Differential includes thyroid nodule, parathyroid adenoma, or lymphadenopathy. Thyroid ultrasound is recommended on a nonemergent basis. Electronically Signed   By: Kristine Garbe M.D.   On: 09/18/2017 23:00   Dg Hand Complete Left  Result Date: 09/18/2017 CLINICAL DATA:  [place] down steps.  Pain and swelling. EXAM: LEFT HAND - COMPLETE 3+ VIEW COMPARISON:  None. FINDINGS: No acute fracture deformity or dislocation. No destructive bony lesions. No advanced osteoarthrosis. Small loose bodies first MTP. Bandage overlying first digit with minimal subcutaneous gas. Dorsal hand soft tissue swelling without subcutaneous gas or radiopaque foreign bodies. IMPRESSION: 1. No acute osseous process. 2. First digit laceration.  Dorsal hand soft tissue swelling. Electronically Signed   By: Elon Alas M.D.   On: 09/18/2017 22:45    Procedures .Marland KitchenLaceration Repair Date/Time: 09/19/2017 3:24 AM Performed by: Joanne Gavel, PA-C Authorized by: Joanne Gavel, PA-C   Consent:    Consent obtained:  Verbal   Consent given by:  Patient Anesthesia (see MAR for exact dosages):    Anesthesia method:  Local infiltration   Local anesthetic:  Lidocaine 2% w/o epi Laceration details:    Location:  Face   Face location:  Forehead   Length (cm):  4 Repair type:    Repair type:  Intermediate Pre-procedure details:    Preparation:  Patient was prepped and draped in usual sterile fashion and imaging obtained to evaluate for foreign bodies Exploration:    Hemostasis achieved with:  Direct pressure   Wound exploration: wound explored through full range of motion and entire depth of wound probed and visualized     Wound extent: no fascia violation noted, no foreign bodies/material noted, no muscle damage noted, no nerve damage noted, no tendon damage noted, no underlying fracture noted and no vascular damage noted     Contaminated: no   Treatment:    Area cleansed with:  Shur-Clens   Amount of cleaning:   Standard Skin repair:    Repair method:  Sutures   Suture size:  6-0   Suture material:  Prolene   Suture technique: Simple interrupted; horizontal mattress    Number of sutures:  6 Approximation:    Approximation:  Close Post-procedure details:    Dressing:  Antibiotic ointment   Patient tolerance of procedure:  Tolerated well, no immediate complications .Marland KitchenLaceration Repair Date/Time: 09/19/2017 3:27 AM Performed by: Joline Maxcy A, PA-C  Authorized by: Joanne Gavel, PA-C   Consent:    Risks discussed:  Infection, pain and retained foreign body Anesthesia (see MAR for exact dosages):    Anesthesia method:  Local infiltration   Local anesthetic:  Lidocaine 2% w/o epi Laceration details:    Location:  Hand   Hand location:  L palm   Length (cm):  1.5 Repair type:    Repair type:  Simple Exploration:    Hemostasis achieved with:  Direct pressure   Wound exploration: wound explored through full range of motion and entire depth of wound probed and visualized     Wound extent: no fascia violation noted, no foreign bodies/material noted, no muscle damage noted, no nerve damage noted, no tendon damage noted, no underlying fracture noted and no vascular damage noted     Contaminated: no   Treatment:    Area cleansed with:  Shur-Clens   Amount of cleaning:  Standard Skin repair:    Repair method:  Sutures   Suture size:  6-0   Suture material:  Prolene   Suture technique: Simple interrupted, horizontal mattress,   Number of sutures:  2 Approximation:    Approximation:  Close Post-procedure details:    Dressing:  Sterile dressing and antibiotic ointment   Patient tolerance of procedure:  Tolerated well, no immediate complications   (including critical care time)  Medications Ordered in ED Medications - No data to display   Initial Impression / Assessment and Plan / ED Course  I have reviewed the triage vital signs and the nursing notes.  Pertinent labs & imaging  results that were available during my care of the patient were reviewed by me and considered in my medical decision making (see chart for details).     82 year old male with a h/o of DM, mild cognitive impairment, and HLD who presents to the emergency department from home with a chief complaint of a mechanical fall down carpeted steps.  No LOC, nausea, or emesis.  The patient was ambulatory after the fall.  He is not anticoagulated.  There is a laceration noted to the mid forehead and at the base of the thumb on the palmar aspect of the left hand.  There is a large hematoma noted to the dorsum of the left hand around the second MCP.  No focal neurologic deficits on exam.  The patient was discussed with Dr. Lita Mains, attending physician.  CT head and neck are negative.  No fracture noted on x-ray of the left hand. Tdap booster is UTD.Pressure irrigation performed. Laceration occurred < 8 hours prior to repair which was well tolerated.  Laceration at the base of the left thumb had several jagged edges and a portion of the skin appeared to have been torn away in the fall.  In order to cover the amount of subcutaneous tissue present at the site of the wound, the wound had to be reapproximated with small amount of tension.  The 4 cm laceration located over the mid forehead did have several superficial jagged edges, but the skin around the wound appeared pink and was able to be closely approximated.  Patient is pre-diabetic and has mild dementia so I will cover the patient prophylactically with a short course of Keflex.  Discussed suture home care w pt and answered questions. Pt to f-u for wound check and suture removal in 7-10 days for the laceration to the hand and 5-7 days for the forehead laceration.  Given the nature of the injury, I  have also requested that the patient call and schedule follow-up appointment with his primary care provider in 2 to 3 days for recheck.  Pt is hemodynamically stable w no  complaints prior to dc.    Final Clinical Impressions(s) / ED Diagnoses   Final diagnoses:  Fall, initial encounter  Laceration of forehead, initial encounter  Laceration of left thumb without foreign body without damage to nail, initial encounter  Contusion of left hand, initial encounter    ED Discharge Orders        Ordered    cephALEXin (KEFLEX) 500 MG capsule  4 times daily     09/19/17 Hudson, Toluwani Yadav A, PA-C 09/19/17 [id number]    Julianne Rice, MD 09/19/17 2342

## 2017-09-18 NOTE — ED Triage Notes (Signed)
Pt reports walking down steps and fell down 8-10 steps and hit forehead and left hand. Pt denies any LOC. Wound noted to forehead that is dressed to control bleeding.

## 2017-09-19 ENCOUNTER — Other Ambulatory Visit: Payer: Self-pay | Admitting: Adult Health

## 2017-09-19 MED ORDER — CEPHALEXIN 500 MG PO CAPS: 500.0000 mg | ORAL_CAPSULE | Freq: Four times a day (QID) | ORAL | 0 refills | Status: AC

## 2017-09-19 MED ORDER — BACITRACIN ZINC 500 UNIT/GM EX OINT
TOPICAL_OINTMENT | Freq: Two times a day (BID) | CUTANEOUS | Status: DC
  Filled 2017-09-19: qty 3.6

## 2017-09-19 NOTE — ED Notes (Signed)
Bed: WA04 Expected date:  Expected time:  Means of arrival:  Comments: 

## 2017-09-19 NOTE — Discharge Instructions (Addendum)
Thank you for allowing me to provide your care today in the Emergency Department.  Please call and schedule follow-up appointment with your primary care provider for a recheck in the next 2 to 3 days.  Keep your wounds clean with warm water and soap by washing them at least once daily.  After you have cleaned the area, apply a thin layer of bacitracin or Neosporin.  Please keep a dressing over the left hand until your stitches come out to avoid ripping out your stitches.   Take 1 tablet of Keflex every 6 hours for the next 5 days to prevent infection.  The stitches in the hand can be removed in 7 to 10 days and the stitches to your forehead can be removed in 5 to 7 days.  Take 650 mg of Tylenol every 6 hours as needed for pain control.  Return to the emergency department if you develop a fever, chills, or if the area around the wounds get red, hot, or swollen.  Do not submerge your hand or your forehead and water such as in a pool or hot tub until your wounds have completely closed.

## 2017-09-19 NOTE — ED Notes (Signed)
Suture cart at bedside 

## 2017-09-22 ENCOUNTER — Encounter: Payer: Self-pay | Admitting: Family Medicine

## 2017-09-22 ENCOUNTER — Ambulatory Visit (INDEPENDENT_AMBULATORY_CARE_PROVIDER_SITE_OTHER): Payer: PPO | Admitting: Family Medicine

## 2017-09-22 VITALS — BP 110/80 | HR 76 | Temp 98.3°F | Ht 74.0 in | Wt 226.3 lb

## 2017-09-22 DIAGNOSIS — W19XXXD Unspecified fall, subsequent encounter: Secondary | ICD-10-CM

## 2017-09-22 DIAGNOSIS — S61412D Laceration without foreign body of left hand, subsequent encounter: Secondary | ICD-10-CM | POA: Diagnosis not present

## 2017-09-22 DIAGNOSIS — R600 Localized edema: Secondary | ICD-10-CM

## 2017-09-22 DIAGNOSIS — S0181XD Laceration without foreign body of other part of head, subsequent encounter: Secondary | ICD-10-CM

## 2017-09-22 NOTE — Patient Instructions (Signed)
Facial Laceration A facial laceration is a cut on the face. These injuries can be painful and cause bleeding. Some cuts may need to be closed with stitches (sutures), skin adhesive strips, or wound glue. Cuts usually heal quickly but can leave a scar. It can take 1-2 years for the scar to go away completely. Follow these instructions at home:  Only take medicines as told by your doctor.  Follow your doctor's instructions for wound care. For Stitches:  Keep the cut clean and dry.  If you have a bandage (dressing), change it at least once a day. Change the bandage if it gets wet or dirty, or as told by your doctor.  Wash the cut with soap and water 2 times a day. Rinse the cut with water. Pat it dry with a clean towel.  Put a thin layer of medicated cream on the cut as told by your doctor.  You may shower after the first 24 hours. Do not soak the cut in water until the stitches are removed.  Have your stitches removed as told by your doctor.  Do not wear any makeup until a few days after your stitches are removed. For Skin Adhesive Strips:  Keep the cut clean and dry.  Do not get the strips wet. You may take a bath, but be careful to keep the cut dry.  If the cut gets wet, pat it dry with a clean towel.  The strips will fall off on their own. Do not remove the strips that are still stuck to the cut. For Wound Glue:  You may shower or take baths. Do not soak or scrub the cut. Do not swim. Avoid heavy sweating until the glue falls off on its own. After a shower or bath, pat the cut dry with a clean towel.  Do not put medicine or makeup on your cut until the glue falls off.  If you have a bandage, do not put tape over the glue.  Avoid lots of sunlight or tanning lamps until the glue falls off.  The glue will fall off on its own in 5-10 days. Do not pick at the glue. After Healing:  Put sunscreen on the cut for the first year to reduce your scar. Contact a doctor if:  You  have a fever. Get help right away if:  Your cut area gets red, painful, or puffy (swollen).  You see a yellowish-white fluid (pus) coming from the cut. This information is not intended to replace advice given to you by your health care provider. Make sure you discuss any questions you have with your health care provider. Document Released: 08/17/2007 Document Revised: 08/06/2015 Document Reviewed: 10/11/2012 Elsevier Interactive Patient Education  2017 [employer], or Adhesive Wound Closure Doctors use stitches (sutures), staples, and certain glue (skin adhesives) to hold your skin together while it heals (wound closure). You may need this treatment after you have surgery or if you cut your skin accidentally. These methods help your skin heal more quickly. They also make it less likely that you will have a scar. What are the different kinds of wound closures? There are many options for wound closure. The one that your doctor uses depends on how deep and large your wound is. Adhesive Glue To use this glue to close a wound, your doctor holds the edges of the wound together and paints the glue on the surface of your skin. You may need more than one layer of glue. Then  the wound may be covered with a light bandage (dressing). This type of skin closure may be used for small wounds that are not deep (superficial). Using glue for wound closure is less painful than other methods. It does not require a medicine that numbs the area. This method also leaves nothing to be removed. Adhesive glue is often used for children and on facial wounds. Adhesive glue cannot be used for wounds that are deep, uneven, or bleeding. It is not used inside of a wound. Adhesive Strips These strips are made of sticky (adhesive), porous paper. They are placed across your skin edges like a regular adhesive bandage. You leave them on until they fall off. Adhesive strips may be used to close very superficial  wounds. They may also be used along with sutures to improve closure of your skin edges. Sutures Sutures are the oldest method of wound closure. Sutures can be made from natural or synthetic materials. They can be made from a material that your body can break down as your wound heals (absorbable), or they can be made from a material that needs to be removed from your skin (nonabsorbable). They come in many different strengths and sizes. Your doctor attaches the sutures to a steel needle on one end. Sutures can be passed through your skin, or through the tissues beneath your skin. Then they are tied and cut. Your skin edges may be closed in one continuous stitch or in separate stitches. Sutures are strong and can be used for all kinds of wounds. Absorbable sutures may be used to close tissues under the skin. The disadvantage of sutures is that they may cause skin reactions that lead to infection. Nonabsorbable sutures need to be removed. Staples When surgical staples are used to close a wound, the edges of your skin on both sides of the wound are brought close together. A staple is placed across the wound, and an instrument secures the edges together. Staples are often used to close surgical cuts (incisions). Staples are faster to use than sutures, and they cause less reaction from your skin. Staples need to be removed using a tool that bends the staples away from your skin. How do I care for my wound closure?  Take medicines only as told by your doctor.  If you were prescribed an antibiotic medicine for your wound, finish it all even if you start to feel better.  Use ointments or creams only as told by your doctor.  Wash your hands with soap and water before and after touching your wound.  Do not soak your wound in water. Do not take baths, swim, or use a hot tub until your doctor says it is okay.  Ask your doctor when you can start showering. Cover your wound if told by your doctor.  Do not  take out your own sutures or staples.  Do not pick at your wound. Picking can cause an infection.  Keep all follow-up visits as told by your doctor. This is important. How long will I have my wound closure?  Leave adhesive glue on your skin until the glue peels away.  Leave adhesive strips on your skin until they fall off.  Absorbable sutures will dissolve within several days.  Nonabsorbable sutures and staples must be removed. The location of the wound will determine how long they stay in. This can range from several days to a couple of weeks. When should I seek help for my wound closure? Contact your doctor if:  You  have a fever.  You have chills.  You have redness, puffiness (swelling), or pain at the site of your wound.  You have fluid, blood, or pus coming from your wound.  There is a bad smell coming from your wound.  The skin edges of your wound start to separate after your sutures have been removed.  Your wound becomes thick, raised, and darker in color after your sutures come out (scarring).  This information is not intended to replace advice given to you by your health care provider. Make sure you discuss any questions you have with your health care provider. Document Released: 12/26/2008 Document Revised: 10/28/2015 Document Reviewed: 08/07/2013 Elsevier Interactive Patient Education  [employer].

## 2017-09-22 NOTE — Progress Notes (Signed)
Subjective:    Patient ID: Erik Lara, male    DOB: Aug 18, 1935, 82 y.o.   MRN: [id number]  Chief Complaint  Patient presents with  . Follow-up  Pt accompanied by his wife.  HPI Patient was seen today for acute concern.  Pt was seen in [place] 09/18/17 s/p fall down carpeted steps.  Pt received a laceration to his forehead and L thumb requiring sutures.  L hand xray negative.   Since d/c pt has been missing doses of keflex.  States he is "behind 3-4 pills".   Pt's hand is still swollen.  Of note a 3.4 cm mass posterior ot L thyriod noted on CT head and cervical spine , f/u u/s recommended.  Past Medical History:  Diagnosis Date  . Cysts    liver  . Diabetes mellitus ([place])   . Gallstones    asymptomatic  . Hyperlipidemia   . Obesity   . Prostatitis    requiring a trip to the ER in [place] with a fever - 09    No Known Allergies  ROS General: Denies fever, chills, night sweats, changes in weight, changes in appetite HEENT: Denies headaches, ear pain, changes in vision, rhinorrhea, sore throat CV: Denies CP, palpitations, SOB, orthopnea Pulm: Denies SOB, cough, wheezing GI: Denies abdominal pain, nausea, vomiting, diarrhea, constipation GU: Denies dysuria, hematuria, frequency, vaginal discharge Msk: Denies muscle cramps, joint pains   +L hand edema Neuro: Denies weakness, numbness, tingling Skin: Denies rashes, bruising  +laceration of L hand and forehead. Psych: Denies depression, anxiety, hallucinations     Objective:    Blood pressure 110/80, pulse 76, temperature 98.3 F (36.8 C), temperature source Oral, height 6\' 2"  (1.88 m), weight 226 lb 4.8 oz (102.6 kg).   Gen. Pleasant, well-nourished, in no distress, normal affect   HEENT: [place]/AT, face symmetric, no scleral icterus, nares patent without drainage Lungs: no accessory muscle use Cardiovascular: RRR Musculoskeletal: L hand edematous with ecchymosis present, norma cap refill, L radial pulse 2+, no clubbing,  normal tone.  Almost able to completely close fist.   Neuro:  A&Ox3, CN II-XII intact, normal gait Skin:  Warm, dry.  Forehead midline with sutures in place, minimal white drainage expressed from wound hair matted/stuck to wound.  Base of L thumb with sutures in place no drainage   Wt Readings from Last 3 Encounters:  09/22/17 226 lb 4.8 oz (102.6 kg)  09/18/17 230 lb (104.3 kg)  04/18/17 226 lb (102.5 kg)    Lab Results  Component Value Date   WBC 9.4 04/18/2017   HGB 14.2 04/18/2017   HCT 42.1 04/18/2017   PLT 287.0 04/18/2017   GLUCOSE 155 (H) 04/18/2017   CHOL 115 04/18/2017   TRIG 75.0 04/18/2017   HDL 45.70 04/18/2017   LDLCALC 54 04/18/2017   ALT 16 04/18/2017   AST 14 04/18/2017   NA 137 04/18/2017   K 4.5 04/18/2017   CL 101 04/18/2017   CREATININE 0.62 04/18/2017   BUN 10 04/18/2017   CO2 29 04/18/2017   TSH 0.17 (L) 04/18/2017   PSA 3.83 03/28/2016   HGBA1C 7.0 (H) 04/18/2017   MICROALBUR <0.7 03/28/2016    Assessment/Plan:  Fall, subsequent encounter  Hand edema -cap refill, 2+pulse, and ability to move hand reassuring -Ice -continue to monitor -if pt endorses pain will re-xray as a wrist fx may not be evident on initial imaging  Laceration of forehead, subsequent encounter -pt to keep area clean and dry, but avoid submerging  -  pt advised to take keflex as directed to avoid infection -day 4 s/p suture placement.  Removal advised in 5-7 days -pt to RTC on Monday for laceration removal  Laceration of left hand without foreign body, subsequent encounter -pt advised to take keflex as directed to avoid infection -day 4 s/p suture placement -removal advised in 7-10 days -pt to RTC on Monday for removal.  F/u with pcp for Thyroid u/s for 3.4 cm mass posterior ot L thyriod noted on CT head and cervical spine on 09/18/17.  Grier Mitts, MD

## 2017-09-25 ENCOUNTER — Ambulatory Visit (INDEPENDENT_AMBULATORY_CARE_PROVIDER_SITE_OTHER): Payer: PPO | Admitting: Family Medicine

## 2017-09-25 ENCOUNTER — Encounter: Payer: Self-pay | Admitting: Family Medicine

## 2017-09-25 VITALS — BP 118/82 | HR 74 | Temp 98.2°F | Wt 226.0 lb

## 2017-09-25 DIAGNOSIS — Z4802 Encounter for removal of sutures: Secondary | ICD-10-CM | POA: Diagnosis not present

## 2017-09-25 NOTE — Progress Notes (Signed)
Subjective:    Erik Lara is a 82 y.o. male who obtained a laceration 7 days ago, which required closure with 8 sutures (2 in his L hand and 6 in his forehead). Mechanism of injury: Fall down steps. He denies pain, redness, or drainage from the wound. His last tetanus was 4 years ago.  The following portions of the patient's history were reviewed and updated as appropriate: allergies, current medications, past family history, past medical history, past social history, past surgical history and problem list.  Review of Systems A comprehensive review of systems was negative.    Objective:    BP 118/82 (BP Location: Left Arm, Patient Position: Sitting, Cuff Size: Normal)   Pulse 74   Temp 98.2 F (36.8 C) (Oral)   Wt 226 lb (102.5 kg)   SpO2 98%   BMI 29.02 kg/m  Injury exam:  A 4 cm laceration noted on the forehead and a 1.5cm laceration at the base of the L thumb are healing well, without evidence of infection.      Assessment:    Lacerations are healing well, without evidence of infection.    Plan:     1. 8 sutures were removed (6 in the forehead and 2 in base of L thumb). 2. Wound care discussed. 3. Follow up as needed.    Grier Mitts, MD

## 2018-02-06 DIAGNOSIS — L819 Disorder of pigmentation, unspecified: Secondary | ICD-10-CM | POA: Diagnosis not present

## 2018-02-06 DIAGNOSIS — D229 Melanocytic nevi, unspecified: Secondary | ICD-10-CM | POA: Diagnosis not present

## 2018-02-06 DIAGNOSIS — L570 Actinic keratosis: Secondary | ICD-10-CM | POA: Diagnosis not present

## 2018-02-06 DIAGNOSIS — L821 Other seborrheic keratosis: Secondary | ICD-10-CM | POA: Diagnosis not present

## 2018-02-06 DIAGNOSIS — L738 Other specified follicular disorders: Secondary | ICD-10-CM | POA: Diagnosis not present

## 2018-02-27 ENCOUNTER — Ambulatory Visit: Payer: Self-pay

## 2018-02-27 NOTE — Telephone Encounter (Signed)
Patient called in with c/o "knee pain." He says "this happened 6 days ago when I was sitting [place] style putting water at the base of the Christmas tree. I was stretching and felt a pull to my left knee. The pain is more 2-3 inches above the knee in the muscle. It feels sharp. I was able to get up and have used a cane to help support my walking. The pain is a 5, but now I'm feeling better because I took Ibuprofen before you called and it has helped a lot." I asked about swelling, he denies. I asked about other symptom, he denies. According to protocol, see PCP within 3 days, no availability with PCP, appointment scheduled for tomorrow at 1300 with Dr. Volanda Napoleon, care advice given, patient verbalized understanding.  Reason for Disposition . [1] MODERATE pain (e.g., interferes with normal activities, limping) AND [2] present > 3 days  Answer Assessment - Initial Assessment Questions 1. LOCATION and RADIATION: "Where is the pain located?"      2-3 inches above left knee in the muscle 2. QUALITY: "What does the pain feel like?"  (e.g., sharp, dull, aching, burning)     Sharp 3. SEVERITY: "How bad is the pain?" "What does it keep you from doing?"   (Scale 1-10; or mild, moderate, severe)   -  MILD (1-3): doesn't interfere with normal activities    -  MODERATE (4-7): interferes with normal activities (e.g., work or school) or awakens from sleep, limping    -  SEVERE (8-10): excruciating pain, unable to do any normal activities, unable to walk     5 4. ONSET: "When did the pain start?" "Does it come and go, or is it there all the time?"     About 6 days ago 5. RECURRENT: "Have you had this pain before?" If so, ask: "When, and what happened then?"     No 6. SETTING: "Has there been any recent work, exercise or other activity that involved that part of the body?"      Sitting [place] style on the floor leaning into the base of the tree putting water in the pain reaching in and felt a pull 7. AGGRAVATING  FACTORS: "What makes the knee pain worse?" (e.g., walking, climbing stairs, running)     Standing to walk 8. ASSOCIATED SYMPTOMS: "Is there any swelling or redness of the knee?"     No 9. OTHER SYMPTOMS: "Do you have any other symptoms?" (e.g., chest pain, difficulty breathing, fever, calf pain)     No 10. PREGNANCY: "Is there any chance you are pregnant?" "When was your last menstrual period?"       N/A  Protocols used: KNEE PAIN-A-AH

## 2018-02-28 ENCOUNTER — Ambulatory Visit (INDEPENDENT_AMBULATORY_CARE_PROVIDER_SITE_OTHER): Payer: PPO | Admitting: Family Medicine

## 2018-02-28 ENCOUNTER — Encounter: Payer: Self-pay | Admitting: Family Medicine

## 2018-02-28 VITALS — BP 132/74 | HR 66 | Temp 98.3°F | Wt 217.0 lb

## 2018-02-28 DIAGNOSIS — M79605 Pain in left leg: Secondary | ICD-10-CM

## 2018-02-28 DIAGNOSIS — S86812A Strain of other muscle(s) and tendon(s) at lower leg level, left leg, initial encounter: Secondary | ICD-10-CM | POA: Diagnosis not present

## 2018-02-28 NOTE — Progress Notes (Signed)
Subjective:    Patient ID: Erik Lara, male    DOB: [birth year], 82 y.o.   MRN: [id number]  No chief complaint on file.   HPI Patient was seen today for acute concern.  Pt endorses L leg pain since bending down/sitting with legs crossed while trying to water his Christmas tree.  Pt notes feeling a pulling sensation after forcing his legs to cross.  Pain in located superior to L knee.  Pt used a cane in the days right after the injury.  Pt notes improvement in pain with movement.  Has tried Ibuprofen which helps, but didn't want "to become addicted".  Gradual improvement in pain each day.  Pt denies edema, erythema, popping/clicking, pain in knee joint.  Past Medical History:  Diagnosis Date  . Cysts    liver  . Diabetes mellitus ([place])   . Gallstones    asymptomatic  . Hyperlipidemia   . Obesity   . Prostatitis    requiring a trip to the ER in [place] with a fever - 09    No Known Allergies  ROS General: Denies fever, chills, night sweats, changes in weight, changes in appetite HEENT: Denies headaches, ear pain, changes in vision, rhinorrhea, sore throat CV: Denies CP, palpitations, SOB, orthopnea Pulm: Denies SOB, cough, wheezing GI: Denies abdominal pain, nausea, vomiting, diarrhea, constipation GU: Denies dysuria, hematuria, frequency, vaginal discharge Msk: Denies muscle cramps, joint pains  +pain in L leg superior to L knee. Neuro: Denies weakness, numbness, tingling Skin: Denies rashes, bruising Psych: Denies depression, anxiety, hallucinations    Objective:    Blood pressure 132/74, pulse 66, temperature 98.3 F (36.8 C), weight 217 lb (98.4 kg), SpO2 96 %.  Gen. Pleasant, well-nourished, in no distress, normal affect   HEENT: [place]/AT, face symmetric, no scleral icterus, PERRLA, nares patent without drainage Lungs: no accessory muscle use Cardiovascular: RRR, no peripheral edema Musculoskeletal: No TTP of joint line, patella, thigh, or lower leg b/l.  No  edema, crepitus, erythema, effusion, or increased warmth.  No deformities, no cyanosis or clubbing, normal tone Neuro:  A&Ox3, CN II-XII intact, slowed gait with small steps after sitting, then able to ease into normal stride  Wt Readings from Last 3 Encounters:  02/28/18 217 lb (98.4 kg)  09/25/17 226 lb (102.5 kg)  09/22/17 226 lb 4.8 oz (102.6 kg)    Lab Results  Component Value Date   WBC 9.4 04/18/2017   HGB 14.2 04/18/2017   HCT 42.1 04/18/2017   PLT 287.0 04/18/2017   GLUCOSE 155 (H) 04/18/2017   CHOL 115 04/18/2017   TRIG 75.0 04/18/2017   HDL 45.70 04/18/2017   LDLCALC 54 04/18/2017   ALT 16 04/18/2017   AST 14 04/18/2017   NA 137 04/18/2017   K 4.5 04/18/2017   CL 101 04/18/2017   CREATININE 0.62 04/18/2017   BUN 10 04/18/2017   CO2 29 04/18/2017   TSH 0.17 (L) 04/18/2017   PSA 3.83 03/28/2016   HGBA1C 7.0 (H) 04/18/2017   MICROALBUR <0.7 03/28/2016    Assessment/Plan:  Leg pain, anterior, left -continue ibuprofen prn.  Advised okay to take as needed during the day.  Patient advised to take ibuprofen with food. -Also discussed massage, heat, etc -Given handout -ok to use cane if needed.  Discouraged from using knee brace to avoid a false sense of stability  Strain of left patellar tendon, initial encounter  -discussed stretching and exercises to help strengthen legs.  F/u prn  Grier Mitts, MD

## 2018-02-28 NOTE — Patient Instructions (Addendum)
It is okay to take ibuprofen 400 mg every 8 hours if needed for pain/discomfort in your left leg.  Make sure you are taking ibuprofen with food and not on an empty stomach.  If your symptoms continue follow back up in clinic.  Muscle Strain A muscle strain is an injury that happens when a muscle is stretched longer than normal. This can happen during a fall, sports, or lifting. This can tear some muscle fibers. Usually, recovery from muscle strain takes 1-2 weeks. Complete healing normally takes 5-6 weeks. This condition is first treated with PRICE therapy. This involves:  Protecting your muscle from being injured again.  Resting your injured muscle.  Icing your injured muscle.  Applying pressure (compression) to your injured muscle. This may be done with a splint or elastic bandage.  Raising (elevating) your injured muscle. Your doctor may also recommend medicine for pain. Follow these instructions at home: If you have a splint:  Wear the splint as told by your doctor. Take it off only as told by your doctor.  Loosen the splint if your fingers or toes tingle, get numb, or turn cold and blue.  Keep the splint clean.  If the splint is not waterproof: ? Do not let it get wet. ? Cover it with a watertight covering when you take a bath or a shower. Managing pain, stiffness, and swelling   If directed, put ice on your injured area. ? If you have a removable splint, take it off as told by your doctor. ? Put ice in a plastic bag. ? Place a towel between your skin and the bag. ? Leave the ice on for 20 minutes, 2-3 times a day.  Move your fingers or toes often. This helps to avoid stiffness and lessen swelling.  Raise your injured area above the level of your heart while you are sitting or lying down.  Wear an elastic bandage as told by your doctor. Make sure it is not too tight. General instructions  Take over-the-counter and prescription medicines only as told by your  doctor.  Limit your activity. Rest your injured muscle as told by your doctor. Your doctor may say that gentle movements are okay.  If physical therapy was prescribed, do exercises as told by your doctor.  Do not put pressure on any part of the splint until it is fully hardened. This may take many hours.  Do not use any products that contain nicotine or tobacco, such as cigarettes and e-cigarettes. These can delay bone healing. If you need help quitting, ask your doctor.  Warm up before you exercise. This helps to prevent more muscle strains.  Ask your doctor when it is safe to drive if you have a splint.  Keep all follow-up visits as told by your doctor. This is important. Contact a doctor if:  You have more pain or swelling in your injured area. Get help right away if:  You have any of these problems in your injured area: ? You have numbness. ? You have tingling. ? You lose a lot of strength. Summary  A muscle strain is an injury that happens when a muscle is stretched longer than normal.  This condition is first treated with PRICE therapy. This includes protecting, resting, icing, adding pressure, and raising your injury.  Limit your activity. Rest your injured muscle as told by your doctor. Your doctor may say that gentle movements are okay.  Warm up before you exercise. This helps to prevent more muscle strains.  This information is not intended to replace advice given to you by your health care provider. Make sure you discuss any questions you have with your health care provider. Document Released: 12/08/2007 Document Revised: 04/06/2016 Document Reviewed: 04/06/2016 Elsevier Interactive Patient Education  2019 [place] therapy can help ease sore, stiff, injured, and tight muscles and joints. Heat relaxes your muscles, which may help ease your pain and muscle spasms. Do not use heat therapy unless your doctor tells you to use it. How to use heat  therapy There are several different kinds of heat therapy, including:  Moist heat pack.  Hot water bottle.  Electric heating pad.  Heated gel pack.  Heated wrap.  Warm water bath. Your doctor will tell you how to use heat therapy. In general, you should: 1. Place a towel between your skin and the heat source. 2. Leave the heat on for 20-30 minutes. Your skin may turn pink. 3. Remove the heat if your skin turns bright red. You should remove the heat source if you are unable to feel pain, heat, or cold. You are more likely to get burned if you leave it on the skin for too long. Your doctor may also tell you to take a warm water bath. To do this: 1. Put a non-slip pad in the bathtub to prevent a fall. 2. Fill the bathtub with warm water. 3. Check the water temperature. 4. Soak in the water for 15-20 minutes, or as told by your doctor. 5. Be careful when you stand up after the bath. You may feel dizzy. 6. Pat yourself dry after the bath. Do not rub your skin to dry it. General recommendations for heat therapy  Do not sleep while using heat therapy. Only use heat therapy while you are awake.  Your skin may turn pink while using heat therapy. Do not use heat therapy if your skin turns red.  Do not use heat therapy if you have a new injury.  High heat or using heat for a long time can cause burns. Be careful not to burn your skin when using heat therapy.  Do not use heat therapy on areas of your skin that are already irritated, such as with a rash or sunburn. Get help if you have:  Blisters, redness, swelling (puffiness), or numbness.  New pain.  Pain that is getting worse. Summary  Heat therapy is the use of heat to help ease sore, stiff, injured, and tight muscles and joints.  There are different types of heat therapy. Your doctor will tell you which one to use.  Only use heat therapy while you are awake.  Watch your skin to make sure you do not get burned while using  heat therapy. This information is not intended to replace advice given to you by your health care provider. Make sure you discuss any questions you have with your health care provider. Document Released: 05/23/2011 Document Revised: 03/11/2017 Document Reviewed: 03/11/2017 Elsevier Interactive Patient Education  2019 [employer].

## 2018-03-19 ENCOUNTER — Other Ambulatory Visit: Payer: Self-pay | Admitting: Adult Health

## 2018-03-20 NOTE — Telephone Encounter (Signed)
TOC appt on 04/19/18

## 2018-03-21 ENCOUNTER — Encounter: Payer: Self-pay | Admitting: Family Medicine

## 2018-03-21 ENCOUNTER — Ambulatory Visit (INDEPENDENT_AMBULATORY_CARE_PROVIDER_SITE_OTHER): Payer: PPO | Admitting: Family Medicine

## 2018-03-21 ENCOUNTER — Ambulatory Visit (INDEPENDENT_AMBULATORY_CARE_PROVIDER_SITE_OTHER): Payer: PPO

## 2018-03-21 VITALS — BP 118/68 | HR 80 | Temp 98.1°F | Wt 216.0 lb

## 2018-03-21 DIAGNOSIS — M1712 Unilateral primary osteoarthritis, left knee: Secondary | ICD-10-CM | POA: Diagnosis not present

## 2018-03-21 DIAGNOSIS — M25562 Pain in left knee: Secondary | ICD-10-CM

## 2018-03-21 NOTE — Progress Notes (Signed)
Subjective:    Patient ID: Erik Lara, male    DOB: [birth year], 83 y.o.   MRN: [id number]  No chief complaint on file. pt accompanied by his wife.  HPI Patient was seen today for ongoing concern.  Pt endorses continued L knee pain.  Seen 12/18 for same.  States feels like pain is getting better but doesn't understand why it is taking so long.  Pt notes worsend symptoms with prolonged standing.  Took ibuprofen a few times but stopped as does not want to become addicted.  Noted worsened pain after sitting for 2.5 hours in the movie theater. Using a cane for ambulation.  Has 2 knee sleeves but notes the pain is a just above where the knee sleeve stops.  Past Medical History:  Diagnosis Date  . Cysts    liver  . Diabetes mellitus ([place])   . Gallstones    asymptomatic  . Hyperlipidemia   . Obesity   . Prostatitis    requiring a trip to the ER in [place] with a fever - 09    No Known Allergies  ROS General: Denies fever, chills, night sweats, changes in weight, changes in appetite HEENT: Denies headaches, ear pain, changes in vision, rhinorrhea, sore throat CV: Denies CP, palpitations, SOB, orthopnea Pulm: Denies SOB, cough, wheezing GI: Denies abdominal pain, nausea, vomiting, diarrhea, constipation GU: Denies dysuria, hematuria, frequency, vaginal discharge Msk: Denies muscle cramps, joint pains  +L knee/leg pain Neuro: Denies weakness, numbness, tingling Skin: Denies rashes, bruising Psych: Denies depression, anxiety, hallucinations    Objective:    Blood pressure 118/68, pulse 80, temperature 98.1 F (36.7 C), temperature source Oral, weight 216 lb (98 kg), SpO2 97 %.   Gen. Pleasant, well-nourished, in no distress, normal affect  HEENT: [place]/AT, face symmetric, no scleral icterus, PERRLA, nares patent without drainage Lungs: no accessory muscle use Cardiovascular: RRR, no peripheral edema Musculoskeletal: No edema or erythema of L knee.  No TTP of knee or  surrounding tissue.  No crepitus or decreased ROM noted.  No deformities, no cyanosis or clubbing, normal tone Neuro:  A&Ox3, CN II-XII intact, ambulating with cane Skin:  Warm, no lesions/ rash  Wt Readings from Last 3 Encounters:  03/21/18 216 lb (98 kg)  02/28/18 217 lb (98.4 kg)  09/25/17 226 lb (102.5 kg)    Lab Results  Component Value Date   WBC 9.4 04/18/2017   HGB 14.2 04/18/2017   HCT 42.1 04/18/2017   PLT 287.0 04/18/2017   GLUCOSE 155 (H) 04/18/2017   CHOL 115 04/18/2017   TRIG 75.0 04/18/2017   HDL 45.70 04/18/2017   LDLCALC 54 04/18/2017   ALT 16 04/18/2017   AST 14 04/18/2017   NA 137 04/18/2017   K 4.5 04/18/2017   CL 101 04/18/2017   CREATININE 0.62 04/18/2017   BUN 10 04/18/2017   CO2 29 04/18/2017   TSH 0.17 (L) 04/18/2017   PSA 3.83 03/28/2016   HGBA1C 7.0 (H) 04/18/2017   MICROALBUR <0.7 03/28/2016    Assessment/Plan:  Acute pain of left knee  -pt advised ok to use intermittent heat, rest, NSAIDs PRN with food. -Given handout -We will obtain x-ray. - Plan: DG Knee Complete 4 Views Left, Ambulatory referral to Physical Therapy --Update: Patient informed with x-ray results-mild osteoarthritis noted in left knee.  We will proceed with physical therapy.  F/u prn for continued symptoms  Grier Mitts, MD

## 2018-03-21 NOTE — Patient Instructions (Signed)
Acute Knee Pain, Adult  Acute knee pain is sudden and may be caused by damage, swelling, or irritation of the muscles and tissues that support your knee. The injury may result from:   A fall.   An injury to your knee from twisting motions.   A hit to the knee.   Infection.  Acute knee pain may go away on its own with time and rest. If it does not, your health care provider may order tests to find the cause of the pain. These may include:   Imaging tests, such as an X-ray, MRI, or ultrasound.   Joint aspiration. In this test, fluid is removed from the knee.   Arthroscopy. In this test, a lighted tube is inserted into the knee and an image is projected onto a TV screen.   Biopsy. In this test, a sample of tissue is removed from the body and studied under a microscope.  Follow these instructions at home:  Pay attention to any changes in your symptoms. Take these actions to relieve your pain.  If you have a knee sleeve or brace:     Wear the sleeve or brace as told by your health care provider. Remove it only as told by your health care provider.   Loosen the sleeve or brace if your toes tingle, become numb, or turn cold and blue.   Keep the sleeve or brace clean.   If the sleeve or brace is not waterproof:  ? Do not let it get wet.  ? Cover it with a watertight covering when you take a bath or shower.  Activity   Rest your knee.   Do not do things that cause pain or make pain worse.   Avoid high-impact activities or exercises, such as running, jumping rope, or doing jumping jacks.   Work with a physical therapist to make a safe exercise program, as recommended by your health care provider. Do exercises as told by your physical therapist.  Managing pain, stiffness, and swelling     If directed, put ice on the knee:  ? Put ice in a plastic bag.  ? Place a towel between your skin and the bag.  ? Leave the ice on for 20 minutes, 2-3 times a day.   If directed, use an elastic bandage to put pressure  (compression) on your injured knee. This may control swelling, give support, and help with discomfort.  General instructions   Take over-the-counter and prescription medicines only as told by your health care provider.   Raise (elevate) your knee above the level of your heart when you are sitting or lying down.   Sleep with a pillow under your knee.   Do not use any products that contain nicotine or tobacco, such as cigarettes, e-cigarettes, and chewing tobacco. These can delay healing. If you need help quitting, ask your health care provider.   If you are overweight, work with your health care provider and a dietitian to set a weight-loss goal that is healthy and reasonable for you. Extra weight can put pressure on your knee.   Keep all follow-up visits as told by your health care provider. This is important.  Contact a health care provider if:   Your knee pain continues, changes, or gets worse.   You have a fever along with knee pain.   Your knee feels warm to the touch.   Your knee buckles or locks up.  Get help right away if:   Your knee swells,   and the swelling becomes worse.   You cannot move your knee.   You have severe pain in your knee.  Summary   Acute knee pain can be caused by a fall, an injury, an infection, or damage, swelling, or irritation of the tissues that support your knee.   Your health care provider may perform tests to find out the cause of the pain.   Pay attention to any changes in your symptoms. Relieve your pain with rest, medicines, light activity, and use of ice.   Get help if your pain continues or becomes worse, your knee swells, or you cannot move your knee.  This information is not intended to replace advice given to you by your health care provider. Make sure you discuss any questions you have with your health care provider.  Document Released: 12/26/2006 Document Revised: 08/10/2017 Document Reviewed: 08/10/2017  Elsevier Interactive Patient Education  2019  [employer]

## 2018-03-26 ENCOUNTER — Encounter: Payer: Self-pay | Admitting: Family Medicine

## 2018-03-27 ENCOUNTER — Encounter: Payer: Self-pay | Admitting: Physical Therapy

## 2018-03-27 ENCOUNTER — Ambulatory Visit: Payer: PPO | Attending: Family Medicine | Admitting: Physical Therapy

## 2018-03-27 ENCOUNTER — Other Ambulatory Visit: Payer: Self-pay

## 2018-03-27 DIAGNOSIS — M25562 Pain in left knee: Secondary | ICD-10-CM | POA: Insufficient documentation

## 2018-03-27 DIAGNOSIS — M6281 Muscle weakness (generalized): Secondary | ICD-10-CM | POA: Insufficient documentation

## 2018-03-27 DIAGNOSIS — M25662 Stiffness of left knee, not elsewhere classified: Secondary | ICD-10-CM | POA: Insufficient documentation

## 2018-03-27 NOTE — Patient Instructions (Signed)
Access code [email or web address]

## 2018-03-27 NOTE — Therapy (Signed)
[facility] [address] [place], [place], [address] Phone: [phone]   Fax:  [phone]  Physical Therapy Evaluation  Patient Details  Name: Erik Lara MRN: [id number] Date of Birth: 1935-08-11 Referring Provider (PT): Billie Ruddy   Encounter Date: 03/27/2018  PT End of Session - 03/27/18 1455    Visit Number  1    Date for PT Re-Evaluation  05/08/18    PT Start Time  1444    PT Stop Time  1530    PT Time Calculation (min)  46 min    Activity Tolerance  Patient tolerated treatment well    Behavior During Therapy  [facility] for tasks assessed/performed       Past Medical History:  Diagnosis Date  . Cysts    liver  . Diabetes mellitus ([place])   . Gallstones    asymptomatic  . Hyperlipidemia   . Obesity   . Prostatitis    requiring a trip to the ER in [place] with a fever - 09    Past Surgical History:  Procedure Laterality Date  . HERNIA REPAIR     Childhood    There were no vitals filed for this visit.   Subjective Assessment - 03/27/18 1444    Subjective  Started when squatting on the floor to water the Christmas tree.  It started hurting a few days later about mid - Dec.  Uses cane occasionally for first few few steps, but reports he has memory problems and forgets where he puts the cane.  Pt has fallen 2x in past 6 months    Pertinent History  history of falls, memory problems    Limitations  Walking;Standing   standing and walking after sitting   Diagnostic tests  x-ray of knee showing nothing significant    Patient Stated Goals  be able to go from sitting to walking without tightness, decreased cramping and tightness in the leg    Currently in Pain?  Yes    Pain Score  7    last a couple steps after standing   Pain Location  Knee    Pain Orientation  Left    Pain Descriptors / Indicators  Sharp    Pain Type  Acute pain    Pain Radiating Towards  anterior thigh and tightness into  lower leg    Pain Onset  More than a month ago    Pain Frequency  Intermittent    Aggravating Factors   sitting for long periods of time, restless at night, sitting on soft cushions (flared after sitting on soft seats at the movie theater)    Pain Relieving Factors  knee brace, just after getting up and taking a few steps,ibuprofen    Effect of Pain on Daily Activities  hard to walk and increases risk of falls due to weakness after standing    Multiple Pain Sites  No         [facility] PT Assessment - 03/27/18 0001      Assessment   Medical Diagnosis  M25.562 (ICD-10-CM) - Acute pain of left knee    Referring Provider (PT)  Grier Mitts R    Onset Date/Surgical Date  02/28/18    Prior Therapy  No      Precautions   Precautions  None      Restrictions   Weight Bearing Restrictions  No      Balance Screen   Has the patient fallen in the  past 6 months  Yes    How many times?  1   5 months ago     Mus[place] residence    Living Arrangements  Spouse/significant other      Prior Function   Level of [place]  Retired      Associate Professor   Overall Cognitive Status  Within Functional Limits for tasks assessed    Memory  Impaired   reports he has significant STM impairments     Observation/Other Assessments   Focus on Therapeutic Outcomes (FOTO)   32% limited      Posture/Postural Control   Posture/Postural Control  Postural limitations    Postural Limitations  Rounded Shoulders;Forward head;Increased thoracic kyphosis      ROM / Strength   AROM / PROM / Strength  AROM;Strength      AROM   AROM Assessment Site  Knee    Right/Left Knee  Right;Left    Right Knee Extension  0    Left Knee Extension  25      Strength   Overall Strength Comments  Lt LE slightly weaker throughout Lt LE - 4+/5    Strength Assessment Site  --      Palpation   Patella mobility  lateral glide  with crepitus and limited Lt knee; stiff  in all directions    Palpation comment  left quads and peroneals very tight and tender at trigger points      Special Tests   Other special tests  lumbar rotation left and flexion dimishes knee pain in supine      Transfers   Five time sit to stand comments   25 sec no UE use      Ambulation/Gait   Assistive device  Straight cane   only uses for the first few steps then carries   Gait Pattern  Decreased stride length;Lateral trunk lean to right;Lateral trunk lean to left;Trunk flexed                Objective measurements completed on examination: See above findings.              PT Education - 03/27/18 1610    Education Details  [id number]     Person(s) Educated  Patient    Methods  Explanation;Demonstration;Verbal cues;Handout    Comprehension  Verbalized understanding;Returned demonstration       PT Short Term Goals - 03/27/18 1547      PT SHORT TERM GOAL #1   Title  pt will perform TUG and appropriate goal will be made to work on improved gait speed and agility for reduced risk of falls    Time  2    Period  Weeks    Status  New    Target Date  04/10/18        PT Long Term Goals - 03/27/18 1544      PT LONG TERM GOAL #1   Title  Pt will be ind with advanced HEP    Time  6    Period  Weeks    Status  New    Target Date  05/08/18      PT LONG TERM GOAL #2   Title  Pt will report 50% greater ease when standing after sitting at his desk    Time  6    Period  Weeks    Status  New    Target Date  05/08/18  PT LONG TERM GOAL #3   Title  Pt will demonstrate 5 x sit to stand  < 15 seconds for decreased risk of falls    Baseline  25 seconds    Time  6    Period  Weeks    Status  New    Target Date  05/08/18      PT LONG TERM GOAL #4   Title  Pt will be able to begin a walking routine of 10 minutes per days and understanding how to safely build up time    Time  6    Period  Weeks    Status  New    Target Date  05/08/18              Plan - 03/27/18 1557    Clinical Impression Statement  Pt is very friendly and talkative 83 y/o who presents to skilled PT due to left knee pain since mid-December.  Pt feels like knee pain is isolated to knee, but pain changes with lumbar movements as well.  There is some stiffness and grinding palpated in left patellat.  Increased muscle spasms in left quad and peroneal muscles  Pt has reduced symptoms with low trunk rotation to the left and lumbar flexion  in supine.  Pt demonstrates LE weakness and decreased balance based on 5 x sit to stand assessment.  Pt has limited left knee extension ROM.  Pt has gait and posture abnormalities as described above.  He will benefit from skilled PT to address deficits and return to maximum function so he can continue exercising as part of healthy lifestyle as well as reduce his risk of falls.    History and Personal Factors relevant to plan of care:  repeated falls, memory deficits    Clinical Presentation  Evolving    Clinical Presentation due to:  has been falling more recently    Clinical Decision Making  Moderate    Rehab Potential  Excellent    Clinical Impairments Affecting Rehab Potential  advanced age, memory deficits    PT Frequency  2x / week    PT Duration  6 weeks    PT Treatment/Interventions  ADLs/Self Care Home Management;Cryotherapy;Electrical Stimulation;Iontophoresis 4mg /ml Dexamethasone;Moist Heat;Gait training;Stair training;Functional mobility training;Therapeutic activities;Therapeutic exercise;Neuromuscular re-education;Patient/family education;Dry needling;Manual techniques    PT Next Visit Plan  TUG, review posture and HEP, lumbar and LE ROM and stretches, core and LE strengthening    PT Home Exercise Plan  [id number]     Recommended Other Services  eval 03/27/18    Consulted and Agree with Plan of Care  Patient       Patient will benefit from skilled therapeutic intervention in order to improve the following deficits  and impairments:  Abnormal gait, Decreased range of motion, Difficulty walking, Increased fascial restricitons, Increased muscle spasms, Pain, Decreased balance, Impaired flexibility, Decreased strength, Postural dysfunction  Visit Diagnosis: Acute pain of left knee  Stiffness of left knee, not elsewhere classified  Muscle weakness (generalized)     Problem List Patient Active Problem List   Diagnosis Date Noted  . Hearing loss of right ear 01/23/2017  . Mild cognitive impairment 09/13/2016  . Type 2 diabetes mellitus without complication, without long-term current use of insulin ([place]) 03/30/2015  . Short-term memory loss 02/03/2015  . H/O hyperthyroidism 01/28/2013  . ERECTILE DYSFUNCTION, MILD 02/19/2008  . Allergic rhinitis 02/19/2008  . Hyperlipidemia 02/15/2007  . HEPATIC CYST 02/15/2007    Zannie Cove, PT 03/27/2018, 5:36 PM  [facility] [address] [place], [place], [address] Phone: [phone]   Fax:  [phone]  Name: ANCEL EASLER MRN: [id number] Date of Birth: June 21, 1935

## 2018-03-30 ENCOUNTER — Ambulatory Visit: Payer: PPO | Admitting: Physical Therapy

## 2018-03-30 ENCOUNTER — Encounter: Payer: Self-pay | Admitting: Physical Therapy

## 2018-03-30 DIAGNOSIS — M25562 Pain in left knee: Secondary | ICD-10-CM

## 2018-03-30 DIAGNOSIS — M25662 Stiffness of left knee, not elsewhere classified: Secondary | ICD-10-CM

## 2018-03-30 DIAGNOSIS — M6281 Muscle weakness (generalized): Secondary | ICD-10-CM

## 2018-03-30 NOTE — Patient Instructions (Signed)
Access Code: [email or web address]  URL: [email or web address]  Date: 03/30/2018  Prepared by: Sherol Dade   Exercises  Supine Lower Trunk Rotation - 10 reps - 2x daily - 7x weekly  Supine Single Knee to Chest - 10 reps - 2x daily - 7x weekly  Patient Education  [facility] [address], [place], [place] [address] Phone # [phone] Fax [phone]

## 2018-03-30 NOTE — Therapy (Signed)
[facility] [address], [place], [place], [address] Phone: [phone]   Fax:  [phone]  Physical Therapy Treatment  Patient Details  Name: Erik Lara MRN: [id number] Date of Birth: 1935/11/25 Referring Provider (PT): Billie Ruddy   Encounter Date: 03/30/2018  PT End of Session - 03/30/18 1019    Visit Number  2    Date for PT Re-Evaluation  05/08/18    PT Start Time  1009    PT Stop Time  1050    PT Time Calculation (min)  41 min    Activity Tolerance  Patient tolerated treatment well;No increased pain    Behavior During Therapy  WFL for tasks assessed/performed       Past Medical History:  Diagnosis Date  . Cysts    liver  . Diabetes mellitus ([place])   . Gallstones    asymptomatic  . Hyperlipidemia   . Obesity   . Prostatitis    requiring a trip to the ER in [place] with a fever - 09    Past Surgical History:  Procedure Laterality Date  . HERNIA REPAIR     Childhood    There were no vitals filed for this visit.  Subjective Assessment - 03/30/18 1017    Subjective  Pt reports that he takes atleast an hour to complete his HEP and he would like to review this so that he is able to do them better at home. He is pleased that he felt better after his evaluation.     Pertinent History  history of falls, memory problems    Limitations  Walking;Standing   standing and walking after sitting   Diagnostic tests  x-ray of knee showing nothing significant    Patient Stated Goals  be able to go from sitting to walking without tightness, decreased cramping and tightness in the leg    Currently in Pain?  No/denies    Pain Onset  More than a month ago         [facility] PT Assessment - 03/30/18 0001      Standardized Balance Assessment   Standardized Balance Assessment  Timed Up and Go Test      Timed Up and Go Test   TUG Comments  18 sec            [facility] Adult PT Treatment/Exercise -  03/30/18 0001      Self-Care   Self-Care  Posture    Posture  log roll technique during transitions in/out of bed      Exercises   Exercises  Lumbar      Lumbar Exercises: Stretches   Single Knee to Chest Stretch  Left;Right    Single Knee to Chest Stretch Limitations  x10 reps       Lumbar Exercises: Aerobic   Nustep  L2 x5 min, PT present to discuss set up of HEP      Lumbar Exercises: Supine   Other Supine Lumbar Exercises  low trunk rotation 2x10 reps   (+) pain Lt anterior thigh when rotating Rt   Other Supine Lumbar Exercises  active Lt hamstring stretch x10 reps              PT Education - 03/30/18 1052    Education Details  log roll mechanics; technique with HEP    Person(s) Educated  Patient    Methods  Explanation;Handout;Verbal cues    Comprehension  Verbalized understanding  PT Short Term Goals - 03/27/18 1547      PT SHORT TERM GOAL #1   Title  pt will perform TUG and appropriate goal will be made to work on improved gait speed and agility for reduced risk of falls    Time  2    Period  Weeks    Status  New    Target Date  04/10/18        PT Long Term Goals - 03/27/18 1544      PT LONG TERM GOAL #1   Title  Pt will be ind with advanced HEP    Time  6    Period  Weeks    Status  New    Target Date  05/08/18      PT LONG TERM GOAL #2   Title  Pt will report 50% greater ease when standing after sitting at his desk    Time  6    Period  Weeks    Status  New    Target Date  05/08/18      PT LONG TERM GOAL #3   Title  Pt will demonstrate 5 x sit to stand  < 15 seconds for decreased risk of falls    Baseline  25 seconds    Time  6    Period  Weeks    Status  New    Target Date  05/08/18      PT LONG TERM GOAL #4   Title  Pt will be able to begin a walking routine of 10 minutes per days and understanding how to safely build up time    Time  6    Period  Weeks    Status  New    Target Date  05/08/18            Plan -  03/30/18 1053    Clinical Impression Statement  Pt arrived with concerns over his HEP and the length of time it is taking him to complete it during the day. Therapist was able to review this with moderate cuing needed to encourage proper set up and technique. Pt does well with specific instruction and was able to complete both exercises without significant difficulty. Pt's Lt anterior thing symptoms increased during low trunk rotation to the Rt but this did not last long after completing the exercise. Ended the session without reports of knee/LE pain and pt had better understanding of his HEP.     Rehab Potential  Excellent    Clinical Impairments Affecting Rehab Potential  advanced age, memory deficits    PT Frequency  2x / week    PT Duration  6 weeks    PT Treatment/Interventions  ADLs/Self Care Home Management;Cryotherapy;Electrical Stimulation;Iontophoresis 4mg /ml Dexamethasone;Moist Heat;Gait training;Stair training;Functional mobility training;Therapeutic activities;Therapeutic exercise;Neuromuscular re-education;Patient/family education;Dry needling;Manual techniques    PT Next Visit Plan  mechanics with log roll; lumbar and LE ROM and stretches, core and LE strengthening    PT Home Exercise Plan  [id number]     Consulted and Agree with Plan of Care  Patient       Patient will benefit from skilled therapeutic intervention in order to improve the following deficits and impairments:  Abnormal gait, Decreased range of motion, Difficulty walking, Increased fascial restricitons, Increased muscle spasms, Pain, Decreased balance, Impaired flexibility, Decreased strength, Postural dysfunction  Visit Diagnosis: Acute pain of left knee  Stiffness of left knee, not elsewhere classified  Muscle weakness (generalized)     Problem List  Patient Active Problem List   Diagnosis Date Noted  . Hearing loss of right ear 01/23/2017  . Mild cognitive impairment 09/13/2016  . Type 2 diabetes mellitus  without complication, without long-term current use of insulin ([place]) 03/30/2015  . Short-term memory loss 02/03/2015  . H/O hyperthyroidism 01/28/2013  . ERECTILE DYSFUNCTION, MILD 02/19/2008  . Allergic rhinitis 02/19/2008  . Hyperlipidemia 02/15/2007  . HEPATIC CYST 02/15/2007    10:58 AM,03/30/18 Sherol Dade PT, DPT [facility] [address] [place], [place], [address] Phone: [phone]   Fax:  [phone]  Name: DEWAINE MOROCHO MRN: [id number] Date of Birth: June 28, 1935

## 2018-04-02 ENCOUNTER — Other Ambulatory Visit: Payer: Self-pay | Admitting: Adult Health

## 2018-04-02 DIAGNOSIS — G3184 Mild cognitive impairment, so stated: Secondary | ICD-10-CM

## 2018-04-03 ENCOUNTER — Ambulatory Visit: Payer: PPO | Admitting: Physical Therapy

## 2018-04-03 DIAGNOSIS — M25562 Pain in left knee: Secondary | ICD-10-CM | POA: Diagnosis not present

## 2018-04-03 DIAGNOSIS — M25662 Stiffness of left knee, not elsewhere classified: Secondary | ICD-10-CM

## 2018-04-03 DIAGNOSIS — M6281 Muscle weakness (generalized): Secondary | ICD-10-CM

## 2018-04-03 NOTE — Patient Instructions (Signed)
[id number]

## 2018-04-03 NOTE — Therapy (Signed)
[facility] [address] [place], [place], [address] Phone: [phone]   Fax:  [phone]  Physical Therapy Treatment  Patient Details  Name: Erik Lara MRN: [id number] Date of Birth: [birth year] Referring Provider (PT): Billie Ruddy   Encounter Date: 04/03/2018  PT End of Session - 04/03/18 1559    Visit Number  3    Date for PT Re-Evaluation  05/08/18    PT Start Time  0092    PT Stop Time  3300    PT Time Calculation (min)  51 min    Activity Tolerance  Patient tolerated treatment well;No increased pain    Behavior During Therapy  WFL for tasks assessed/performed       Past Medical History:  Diagnosis Date  . Cysts    liver  . Diabetes mellitus ([place])   . Gallstones    asymptomatic  . Hyperlipidemia   . Obesity   . Prostatitis    requiring a trip to the ER in [place] with a fever - 09    Past Surgical History:  Procedure Laterality Date  . HERNIA REPAIR     Childhood    There were no vitals filed for this visit.  Subjective Assessment - 04/03/18 1651    Subjective  Pt arrived late, but was able to be seen due to opening after.  Pt reports it is getting easier to do HEP.  he was able to do 3x today.   Getting up from standing still hurts then 95% better after walking a few steps.    Pertinent History  history of falls, memory problems    Limitations  Walking;Standing   standing after sitting   Patient Stated Goals  be able to go from sitting to walking without tightness, decreased cramping and tightness in the leg    Currently in Pain?  No/denies                       [facility] Adult PT Treatment/Exercise - 04/03/18 0001      Self-Care   Posture  unable to do log roll after lying supine and needed assistance      Neuro Re-ed    Neuro Re-ed Details   cues to keep shoulders back in sitting and standing       Lumbar Exercises: Stretches   Passive Hamstring Stretch   Right;Left;1 rep;10 seconds    Single Knee to Chest Stretch  Left;Right    Single Knee to Chest Stretch Limitations  x10 reps     Lower Trunk Rotation  5 reps   2 sets - 5 sec hold   Pelvic Tilt  1 rep   unable to correctly perform   Figure 4 Stretch  2 reps;10 seconds;Supine      Lumbar Exercises: Seated   Other Seated Lumbar Exercises  scap squeezes with cues for posture- towel roll fo rlumbar support      Manual Therapy   Manual Therapy  Soft tissue mobilization    Manual therapy comments  sidelying    Soft tissue mobilization  lumbar and thoracic paraspinals, left quad and adductors, sacral distraction             PT Education - 04/03/18 1650    Education Details  [id number]     Person(s) Educated  Patient    Methods  Explanation;Demonstration;Handout;Verbal cues;Tactile cues    Comprehension  Verbalized understanding;Returned demonstration  PT Short Term Goals - 03/27/18 1547      PT SHORT TERM GOAL #1   Title  pt will perform TUG and appropriate goal will be made to work on improved gait speed and agility for reduced risk of falls    Time  2    Period  Weeks    Status  New    Target Date  04/10/18        PT Long Term Goals - 03/27/18 1544      PT LONG TERM GOAL #1   Title  Pt will be ind with advanced HEP    Time  6    Period  Weeks    Status  New    Target Date  05/08/18      PT LONG TERM GOAL #2   Title  Pt will report 50% greater ease when standing after sitting at his desk    Time  6    Period  Weeks    Status  New    Target Date  05/08/18      PT LONG TERM GOAL #3   Title  Pt will demonstrate 5 x sit to stand  < 15 seconds for decreased risk of falls    Baseline  25 seconds    Time  6    Period  Weeks    Status  New    Target Date  05/08/18      PT LONG TERM GOAL #4   Title  Pt will be able to begin a walking routine of 10 minutes per days and understanding how to safely build up time    Time  6    Period  Weeks    Status  New     Target Date  05/08/18            Plan - 04/03/18 1653    Clinical Impression Statement  Pt reports he is able to do HEP now.  Pt tolerated treatment well with some increased pain in supine after about 15 minutes.  Felt relief sitting and no knee pain standing up after lying supine.  Pt has increased fascial restriction throughout lumbar and h/s.  It seems like pain was due to stretching sensation of restricted tissues.  Pt has a difficutly time distinguishing between a pain and stretching sensation.  Pt did well with posture re-ed and felt good sitting with lumbar support and doing scap squeezes.  He demonstrates good thoracic mobility after soft tissue to paraspinals.  Pt is recommended to continue with POC.    PT Treatment/Interventions  ADLs/Self Care Home Management;Cryotherapy;Electrical Stimulation;Iontophoresis 4mg /ml Dexamethasone;Moist Heat;Gait training;Stair training;Functional mobility training;Therapeutic activities;Therapeutic exercise;Neuromuscular re-education;Patient/family education;Dry needling;Manual techniques    PT Next Visit Plan  assess TUG; mechanics with log roll; thoracic extension and rotation stretches, posture, lumbar and LE ROM and stretches, core and LE strengthening    PT Home Exercise Plan  [id number]     Consulted and Agree with Plan of Care  Patient       Patient will benefit from skilled therapeutic intervention in order to improve the following deficits and impairments:  Abnormal gait, Decreased range of motion, Difficulty walking, Increased fascial restricitons, Increased muscle spasms, Pain, Decreased balance, Impaired flexibility, Decreased strength, Postural dysfunction  Visit Diagnosis: Acute pain of left knee  Stiffness of left knee, not elsewhere classified  Muscle weakness (generalized)     Problem List Patient Active Problem List   Diagnosis Date Noted  . Hearing loss of  right ear 01/23/2017  . Mild cognitive impairment 09/13/2016  .  Type 2 diabetes mellitus without complication, without long-term current use of insulin ([place]) 03/30/2015  . Short-term memory loss 02/03/2015  . H/O hyperthyroidism 01/28/2013  . ERECTILE DYSFUNCTION, MILD 02/19/2008  . Allergic rhinitis 02/19/2008  . Hyperlipidemia 02/15/2007  . HEPATIC CYST 02/15/2007    Zannie Cove, PT 04/03/2018, 5:07 PM  [facility] [address], [place], [place], [address] Phone: [phone]   Fax:  [phone]  Name: TRAVIS MASTEL MRN: [id number] Date of Birth: 1936-02-03

## 2018-04-05 ENCOUNTER — Ambulatory Visit: Payer: PPO | Admitting: Physical Therapy

## 2018-04-05 ENCOUNTER — Encounter: Payer: Self-pay | Admitting: Physical Therapy

## 2018-04-05 DIAGNOSIS — M25562 Pain in left knee: Secondary | ICD-10-CM | POA: Diagnosis not present

## 2018-04-05 DIAGNOSIS — M6281 Muscle weakness (generalized): Secondary | ICD-10-CM

## 2018-04-05 DIAGNOSIS — M25662 Stiffness of left knee, not elsewhere classified: Secondary | ICD-10-CM

## 2018-04-05 NOTE — Therapy (Signed)
[facility] [address], [place], [place], [address] Phone: [phone]   Fax:  [phone]  Physical Therapy Treatment  Patient Details  Name: Erik Lara MRN: [id number] Date of Birth: Jul 09, 1935 Referring Provider (PT): Billie Ruddy   Encounter Date: 04/05/2018  PT End of Session - 04/05/18 1613    Visit Number  4    Date for PT Re-Evaluation  05/08/18    PT Start Time  0092    PT Stop Time  1612    PT Time Calculation (min)  42 min    Activity Tolerance  Patient tolerated treatment well;No increased pain    Behavior During Therapy  WFL for tasks assessed/performed       Past Medical History:  Diagnosis Date  . Cysts    liver  . Diabetes mellitus ([place])   . Gallstones    asymptomatic  . Hyperlipidemia   . Obesity   . Prostatitis    requiring a trip to the ER in [place] with a fever - 09    Past Surgical History:  Procedure Laterality Date  . HERNIA REPAIR     Childhood    There were no vitals filed for this visit.  Subjective Assessment - 04/05/18 1533    Subjective  Pt states that he is doing great. He was able to complete his daily activity without needing his SPC and without knee pain.     Pertinent History  history of falls, memory problems    Limitations  Walking;Standing   standing after sitting   Patient Stated Goals  be able to go from sitting to walking without tightness, decreased cramping and tightness in the leg    Currently in Pain?  No/denies                       [facility] Adult PT Treatment/Exercise - 04/05/18 0001      Lumbar Exercises: Stretches   Single Knee to Chest Stretch  Left;Right    Single Knee to Chest Stretch Limitations  10x5 sec hold     Other Lumbar Stretch Exercise  seated Lt glute stretch 5x10 sec hold       Lumbar Exercises: Aerobic   Nustep  L2 x5 min, PT present to discuss progress       Lumbar Exercises: Seated   Other Seated  Lumbar Exercises  scap squeezes x15 reps; seated rows with red TB x10 reps       Lumbar Exercises: Supine   Other Supine Lumbar Exercises  B knees to chest with LE on green physioball x15 reps       Manual Therapy   Manual therapy comments  Rt sidelying     Soft tissue mobilization  Lt lateral quadriceps             PT Education - 04/05/18 1612    Education Details  technique with therex    Person(s) Educated  Patient    Methods  Explanation;Verbal cues;Tactile cues    Comprehension  Verbalized understanding;Returned demonstration       PT Short Term Goals - 04/05/18 1644      PT SHORT TERM GOAL #1   Title  pt will perform TUG and appropriate goal will be made to work on improved gait speed and agility for reduced risk of falls    Time  2    Period  Weeks    Status  Achieved  PT Long Term Goals - 03/27/18 1544      PT LONG TERM GOAL #1   Title  Pt will be ind with advanced HEP    Time  6    Period  Weeks    Status  New    Target Date  05/08/18      PT LONG TERM GOAL #2   Title  Pt will report 50% greater ease when standing after sitting at his desk    Time  6    Period  Weeks    Status  New    Target Date  05/08/18      PT LONG TERM GOAL #3   Title  Pt will demonstrate 5 x sit to stand  < 15 seconds for decreased risk of falls    Baseline  25 seconds    Time  6    Period  Weeks    Status  New    Target Date  05/08/18      PT LONG TERM GOAL #4   Title  Pt will be able to begin a walking routine of 10 minutes per days and understanding how to safely build up time    Time  6    Period  Weeks    Status  New    Target Date  05/08/18            Plan - 04/05/18 1613    Clinical Impression Statement  Pt was able to complete all supine therex without reports of increased low back pain this session. He also noted pain free activity this morning which allowed him to walk around his home without his [facility]. He was very pleased with this. Pt did note  lateral thigh discomfort with single leg knee to chest on the Lt as well as double knees to chest. Pt reported reproduction of his muscle soreness with palpation of the proximal lateral quadriceps, so the remainder of the session focused on decreasing muscle tension of the area. Pt reported no pain end of today's session.     PT Treatment/Interventions  ADLs/Self Care Home Management;Cryotherapy;Electrical Stimulation;Iontophoresis 4mg /ml Dexamethasone;Moist Heat;Gait training;Stair training;Functional mobility training;Therapeutic activities;Therapeutic exercise;Neuromuscular re-education;Patient/family education;Dry needling;Manual techniques    PT Next Visit Plan  assess TUG; mechanics with log roll; thoracic extension and rotation stretches, posture, lumbar and LE ROM and stretches, core and LE strengthening    PT Home Exercise Plan  [id number]     Consulted and Agree with Plan of Care  Patient       Patient will benefit from skilled therapeutic intervention in order to improve the following deficits and impairments:  Abnormal gait, Decreased range of motion, Difficulty walking, Increased fascial restricitons, Increased muscle spasms, Pain, Decreased balance, Impaired flexibility, Decreased strength, Postural dysfunction  Visit Diagnosis: Acute pain of left knee  Stiffness of left knee, not elsewhere classified  Muscle weakness (generalized)     Problem List Patient Active Problem List   Diagnosis Date Noted  . Hearing loss of right ear 01/23/2017  . Mild cognitive impairment 09/13/2016  . Type 2 diabetes mellitus without complication, without long-term current use of insulin ([place]) 03/30/2015  . Short-term memory loss 02/03/2015  . H/O hyperthyroidism 01/28/2013  . ERECTILE DYSFUNCTION, MILD 02/19/2008  . Allergic rhinitis 02/19/2008  . Hyperlipidemia 02/15/2007  . HEPATIC CYST 02/15/2007    4:47 PM,04/05/18 Sherol Dade PT, DPT [facility] [address]  [place], [place], [place], [address] Phone: [phone]   Fax:  [phone]  Name: Erik Lara MRN: [id number] Date of Birth: [birth year]

## 2018-04-10 ENCOUNTER — Ambulatory Visit: Payer: PPO | Admitting: Physical Therapy

## 2018-04-10 ENCOUNTER — Encounter: Payer: Self-pay | Admitting: Physical Therapy

## 2018-04-10 DIAGNOSIS — M6281 Muscle weakness (generalized): Secondary | ICD-10-CM

## 2018-04-10 DIAGNOSIS — M25562 Pain in left knee: Secondary | ICD-10-CM

## 2018-04-10 DIAGNOSIS — M25662 Stiffness of left knee, not elsewhere classified: Secondary | ICD-10-CM

## 2018-04-10 NOTE — Therapy (Signed)
[facility] [address] [place], [place], [address] Phone: [phone]   Fax:  [phone]  Physical Therapy Treatment  Patient Details  Name: Erik Lara MRN: [id number] Date of Birth: December 07, 1935 Referring Provider (PT): Billie Ruddy   Encounter Date: 04/10/2018  PT End of Session - 04/10/18 1125    Visit Number  5    Date for PT Re-Evaluation  05/08/18    PT Start Time  1102    PT Stop Time  1145    PT Time Calculation (min)  43 min    Activity Tolerance  Patient tolerated treatment well    Behavior During Therapy  [facility] for tasks assessed/performed       Past Medical History:  Diagnosis Date  . Cysts    liver  . Diabetes mellitus ([place])   . Gallstones    asymptomatic  . Hyperlipidemia   . Obesity   . Prostatitis    requiring a trip to the ER in [place] with a fever - 09    Past Surgical History:  Procedure Laterality Date  . HERNIA REPAIR     Childhood    There were no vitals filed for this visit.  Subjective Assessment - 04/10/18 1108    Subjective  Pt states he fell 2 days ago.  He reports he wasn't very sore after.  He was stepping down a curb and didn't take a big enough step.  Now          [facility] PT Assessment - 04/10/18 0001      Transfers   Five time sit to stand comments   23                   [facility] Adult PT Treatment/Exercise - 04/10/18 0001      Lumbar Exercises: Stretches   Single Knee to Chest Stretch Limitations  10x5 sec hold     Lower Trunk Rotation  5 reps   2 sets - 5 sec hold   Figure 4 Stretch  2 reps;10 seconds;Supine      Lumbar Exercises: Seated   Long Arc Quad on Chair  Strengthening;Left;20 reps    Other Seated Lumbar Exercises  ball roll for lumbar stretch      Lumbar Exercises: Supine   Clam  20 reps   red band   Heel Slides  10 reps               PT Short Term Goals - 04/05/18 1644      PT SHORT TERM GOAL #1   Title   pt will perform TUG and appropriate goal will be made to work on improved gait speed and agility for reduced risk of falls    Time  2    Period  Weeks    Status  Achieved        PT Long Term Goals - 03/27/18 1544      PT LONG TERM GOAL #1   Title  Pt will be ind with advanced HEP    Time  6    Period  Weeks    Status  New    Target Date  05/08/18      PT LONG TERM GOAL #2   Title  Pt will report 50% greater ease when standing after sitting at his desk    Time  6    Period  Weeks    Status  New    Target  Date  05/08/18      PT LONG TERM GOAL #3   Title  Pt will demonstrate 5 x sit to stand  < 15 seconds for decreased risk of falls    Baseline  25 seconds    Time  6    Period  Weeks    Status  New    Target Date  05/08/18      PT LONG TERM GOAL #4   Title  Pt will be able to begin a walking routine of 10 minutes per days and understanding how to safely build up time    Time  6    Period  Weeks    Status  New    Target Date  05/08/18            Plan - 04/10/18 1126    Clinical Impression Statement  Pt was more uncomfortable performing exercises in supine today.  He was overall feeling the knee a little more andwas using SPC when walking into clinic today.  Pt had a fall 2 days ago which set him back. He attempted LAQ with small weight but was only able to do without weight due to quad weakness on Lt LE.  Pt needed min assist with log roll and supine to sit today.  Pt needs a lot of verbal cues to stay on track during treatment.  He will benefit from skilled PT to progress strength and balance    PT Treatment/Interventions  ADLs/Self Care Home Management;Cryotherapy;Electrical Stimulation;Iontophoresis 4mg /ml Dexamethasone;Moist Heat;Gait training;Stair training;Functional mobility training;Therapeutic activities;Therapeutic exercise;Neuromuscular re-education;Patient/family education;Dry needling;Manual techniques    PT Next Visit Plan  assess TUG; continue mechanics  with log roll; thoracic extension and rotation stretches, posture, lumbar and LE ROM and stretches, core and LE strengthening    PT Home Exercise Plan  [id number]     Consulted and Agree with Plan of Care  Patient       Patient will benefit from skilled therapeutic intervention in order to improve the following deficits and impairments:  Abnormal gait, Decreased range of motion, Difficulty walking, Increased fascial restricitons, Increased muscle spasms, Pain, Decreased balance, Impaired flexibility, Decreased strength, Postural dysfunction  Visit Diagnosis: Acute pain of left knee  Stiffness of left knee, not elsewhere classified  Muscle weakness (generalized)     Problem List Patient Active Problem List   Diagnosis Date Noted  . Hearing loss of right ear 01/23/2017  . Mild cognitive impairment 09/13/2016  . Type 2 diabetes mellitus without complication, without long-term current use of insulin ([place]) 03/30/2015  . Short-term memory loss 02/03/2015  . H/O hyperthyroidism 01/28/2013  . ERECTILE DYSFUNCTION, MILD 02/19/2008  . Allergic rhinitis 02/19/2008  . Hyperlipidemia 02/15/2007  . HEPATIC CYST 02/15/2007    Zannie Cove, PT 04/10/2018, 3:31 PM  [facility] [address] [place], [place], [address] Phone: [phone]   Fax:  [phone]  Name: Erik Lara MRN: [id number] Date of Birth: 04/09/1935

## 2018-04-12 ENCOUNTER — Encounter: Payer: Self-pay | Admitting: Physical Therapy

## 2018-04-12 ENCOUNTER — Ambulatory Visit: Payer: PPO | Admitting: Physical Therapy

## 2018-04-12 DIAGNOSIS — M25562 Pain in left knee: Secondary | ICD-10-CM | POA: Diagnosis not present

## 2018-04-12 DIAGNOSIS — M25662 Stiffness of left knee, not elsewhere classified: Secondary | ICD-10-CM

## 2018-04-12 DIAGNOSIS — M6281 Muscle weakness (generalized): Secondary | ICD-10-CM

## 2018-04-12 NOTE — Therapy (Signed)
[facility] [address] [place], [place], [address] Phone: [phone]   Fax:  [phone]  Physical Therapy Treatment  Patient Details  Name: Erik Lara MRN: [id number] Date of Birth: Apr 01, 1935 Referring Provider (PT): Billie Ruddy   Encounter Date: 04/12/2018  PT End of Session - 04/12/18 1450    Visit Number  6    Date for PT Re-Evaluation  05/08/18    PT Start Time  1445    PT Stop Time  1525    PT Time Calculation (min)  40 min    Activity Tolerance  Patient tolerated treatment well    Behavior During Therapy  [facility] for tasks assessed/performed       Past Medical History:  Diagnosis Date  . Cysts    liver  . Diabetes mellitus ([place])   . Gallstones    asymptomatic  . Hyperlipidemia   . Obesity   . Prostatitis    requiring a trip to the ER in [place] with a fever - 09    Past Surgical History:  Procedure Laterality Date  . HERNIA REPAIR     Childhood    There were no vitals filed for this visit.  Subjective Assessment - 04/12/18 1452    Subjective  Pt states he is just sore when he first gets up    Currently in Pain?  No/denies                       [facility] Adult PT Treatment/Exercise - 04/12/18 0001      Timed Up and Go Test   TUG  --   24, 21, 17     Neuro Re-ed    Neuro Re-ed Details   cues for upright posture      Lumbar Exercises: Aerobic   Nustep  L2 x 8 min, PT present to discuss progress       Lumbar Exercises: Standing   Other Standing Lumbar Exercises  toe tapping on step, hip abduction, mini squat - 15 reps each      Lumbar Exercises: Seated   Long Arc Quad on Chair  Strengthening;Left;20 reps    Sit to Stand  20 reps        scap squeezes in standing - 20x        PT Short Term Goals - 04/12/18 1512      PT SHORT TERM GOAL #1   Title  pt will perform TUG and appropriate goal will be made to work on improved gait speed and agility for  reduced risk of falls    Period  Weeks    Status  Achieved        PT Long Term Goals - 04/12/18 1512      PT LONG TERM GOAL #2   Title  Pt will report 50% greater ease when standing after sitting at his desk    Status  On-going      PT LONG TERM GOAL #3   Title  Pt will demonstrate 5 x sit to stand  < 15 seconds for decreased risk of falls    Status  On-going      PT LONG TERM GOAL #4   Title  Pt will be able to begin a walking routine of 10 minutes per days and understanding how to safely build up time    Status  On-going      PT LONG TERM GOAL #5  Title  TUG < or = to 18 sec for reduced risk of falls    Baseline  21 (average of 3 trials)    Status  New    Target Date  05/08/18            Plan - 04/12/18 1530    Clinical Impression Statement  Pt was slow to stand today and had some pain in knee pain during initial first few steps after sitting or standing for > 5 minutes.  He was able to proress to some  standing exercises today.  Pt demonstrates increased fall risk based on his TUG time.  He will benefit from skilled PT to continue to adress impairments and return to maximum functional activities.    PT Treatment/Interventions  ADLs/Self Care Home Management;Cryotherapy;Electrical Stimulation;Iontophoresis 4mg /ml Dexamethasone;Moist Heat;Gait training;Stair training;Functional mobility training;Therapeutic activities;Therapeutic exercise;Neuromuscular re-education;Patient/family education;Dry needling;Manual techniques    PT Next Visit Plan  continue mechanics with log roll; thoracic extension and rotation stretches, posture, lumbar and LE ROM and stretches, core and LE strengthening, balance    PT Home Exercise Plan  [id number]     Consulted and Agree with Plan of Care  Patient       Patient will benefit from skilled therapeutic intervention in order to improve the following deficits and impairments:  Abnormal gait, Decreased range of motion, Difficulty walking, Increased  fascial restricitons, Increased muscle spasms, Pain, Decreased balance, Impaired flexibility, Decreased strength, Postural dysfunction  Visit Diagnosis: Acute pain of left knee  Stiffness of left knee, not elsewhere classified  Muscle weakness (generalized)     Problem List Patient Active Problem List   Diagnosis Date Noted  . Hearing loss of right ear 01/23/2017  . Mild cognitive impairment 09/13/2016  . Type 2 diabetes mellitus without complication, without long-term current use of insulin ([place]) 03/30/2015  . Short-term memory loss 02/03/2015  . H/O hyperthyroidism 01/28/2013  . ERECTILE DYSFUNCTION, MILD 02/19/2008  . Allergic rhinitis 02/19/2008  . Hyperlipidemia 02/15/2007  . HEPATIC CYST 02/15/2007    Zannie Cove, PT 04/12/2018, 5:22 PM  [facility] [address] [place], [place], [address] Phone: [phone]   Fax:  [phone]  Name: Erik Lara MRN: [id number] Date of Birth: 03-10-1936

## 2018-04-17 ENCOUNTER — Ambulatory Visit: Payer: PPO | Attending: Family Medicine | Admitting: Physical Therapy

## 2018-04-17 ENCOUNTER — Encounter: Payer: Self-pay | Admitting: Physical Therapy

## 2018-04-17 DIAGNOSIS — M6281 Muscle weakness (generalized): Secondary | ICD-10-CM | POA: Diagnosis not present

## 2018-04-17 DIAGNOSIS — M25562 Pain in left knee: Secondary | ICD-10-CM | POA: Insufficient documentation

## 2018-04-17 DIAGNOSIS — M25662 Stiffness of left knee, not elsewhere classified: Secondary | ICD-10-CM

## 2018-04-17 NOTE — Therapy (Signed)
[facility] [address] [place], [place], [address] Phone: [phone]   Fax:  [phone]  Physical Therapy Treatment  Patient Details  Name: Erik Lara MRN: [id number] Date of Birth: [birth year] Referring Provider (PT): Billie Ruddy   Encounter Date: 04/17/2018  PT End of Session - 04/17/18 1300    Visit Number  7    Date for PT Re-Evaluation  05/08/18    PT Start Time  1232    PT Stop Time  1310    PT Time Calculation (min)  38 min    Activity Tolerance  Patient tolerated treatment well;Patient limited by pain    Behavior During Therapy  [facility] for tasks assessed/performed       Past Medical History:  Diagnosis Date  . Cysts    liver  . Diabetes mellitus ([place])   . Gallstones    asymptomatic  . Hyperlipidemia   . Obesity   . Prostatitis    requiring a trip to the ER in [place] with a fever - 09    Past Surgical History:  Procedure Laterality Date  . HERNIA REPAIR     Childhood    There were no vitals filed for this visit.  Subjective Assessment - 04/17/18 1307    Subjective  Pt states the other day he had no pain and forgot about his knee.    Currently in Pain?  No/denies                       [facility] Adult PT Treatment/Exercise - 04/17/18 0001      Neuro Re-ed    Neuro Re-ed Details   standing posture, scap squeeze and knee extension in standing      Exercises   Exercises  Knee/Hip      Lumbar Exercises: Aerobic   Nustep  L3 x 5 min, PT present to discuss progress       Lumbar Exercises: Standing   Other Standing Lumbar Exercises  hamstring curl and abduction - 10x each side      Lumbar Exercises: Seated   Long Arc Quad on Chair  Strengthening;Left;20 reps    Sit to Stand  20 reps   elevated surface, no UE     Knee/Hip Exercises: Standing   Heel Raises  20 reps   heel to toe weight shift with finger tips for stability     Knee/Hip Exercises: Supine   Short Arc Quad Sets  Strengthening;Left;5 reps   didn't tolerate well     Manual Therapy   Manual Therapy  Soft tissue mobilization;Joint mobilization    Manual therapy comments  prone with pillow under hips    Joint Mobilization  patella mobs    Soft tissue mobilization  hamstring, gastroc/soleus               PT Short Term Goals - 04/12/18 1512      PT SHORT TERM GOAL #1   Title  pt will perform TUG and appropriate goal will be made to work on improved gait speed and agility for reduced risk of falls    Period  Weeks    Status  Achieved        PT Long Term Goals - 04/12/18 1512      PT LONG TERM GOAL #2   Title  Pt will report 50% greater ease when standing after sitting at his desk    Status  On-going  PT LONG TERM GOAL #3   Title  Pt will demonstrate 5 x sit to stand  < 15 seconds for decreased risk of falls    Status  On-going      PT LONG TERM GOAL #4   Title  Pt will be able to begin a walking routine of 10 minutes per days and understanding how to safely build up time    Status  On-going      PT LONG TERM GOAL #5   Title  TUG < or = to 18 sec for reduced risk of falls    Baseline  21 (average of 3 trials)    Status  New    Target Date  05/08/18            Plan - 04/17/18 1310    Clinical Impression Statement  Pt did not have cane with him today and was faster to stand up from waiting room.  Pt continues to have difficulty with log roll and needs mod assist to perform correctly.  Pt has difficulty getting comfortable in supine and keeps needing to bend or straighten his Left LE.  He did not have pain after treatment and performs all sitting and standing exercises without increased pain.  He needs continuous cues to stantd up tall and not look down.  Pt will continue with current POC.     PT Treatment/Interventions  ADLs/Self Care Home Management;Cryotherapy;Electrical Stimulation;Iontophoresis 4mg /ml Dexamethasone;Moist Heat;Gait training;Stair  training;Functional mobility training;Therapeutic activities;Therapeutic exercise;Neuromuscular re-education;Patient/family education;Dry needling;Manual techniques    PT Next Visit Plan  balance and LE strength, core strength, log rolling and posture    PT Home Exercise Plan  [id number]     Consulted and Agree with Plan of Care  Patient       Patient will benefit from skilled therapeutic intervention in order to improve the following deficits and impairments:  Abnormal gait, Decreased range of motion, Difficulty walking, Increased fascial restricitons, Increased muscle spasms, Pain, Decreased balance, Impaired flexibility, Decreased strength, Postural dysfunction  Visit Diagnosis: Acute pain of left knee  Stiffness of left knee, not elsewhere classified  Muscle weakness (generalized)     Problem List Patient Active Problem List   Diagnosis Date Noted  . Hearing loss of right ear 01/23/2017  . Mild cognitive impairment 09/13/2016  . Type 2 diabetes mellitus without complication, without long-term current use of insulin ([place]) 03/30/2015  . Short-term memory loss 02/03/2015  . H/O hyperthyroidism 01/28/2013  . ERECTILE DYSFUNCTION, MILD 02/19/2008  . Allergic rhinitis 02/19/2008  . Hyperlipidemia 02/15/2007  . HEPATIC CYST 02/15/2007    Zannie Cove, PT 04/17/2018, 1:34 PM  [facility] [address], [place], [place], [address] Phone: [phone]   Fax:  [phone]  Name: Erik Lara MRN: [id number] Date of Birth: 08-02-1935

## 2018-04-19 ENCOUNTER — Encounter: Payer: Self-pay | Admitting: Adult Health

## 2018-04-19 ENCOUNTER — Ambulatory Visit (INDEPENDENT_AMBULATORY_CARE_PROVIDER_SITE_OTHER): Payer: PPO | Admitting: Adult Health

## 2018-04-19 VITALS — BP 136/70 | Temp 98.3°F | Wt 217.0 lb

## 2018-04-19 DIAGNOSIS — G3184 Mild cognitive impairment, so stated: Secondary | ICD-10-CM | POA: Diagnosis not present

## 2018-04-19 DIAGNOSIS — Z0000 Encounter for general adult medical examination without abnormal findings: Secondary | ICD-10-CM

## 2018-04-19 DIAGNOSIS — E785 Hyperlipidemia, unspecified: Secondary | ICD-10-CM | POA: Diagnosis not present

## 2018-04-19 DIAGNOSIS — E119 Type 2 diabetes mellitus without complications: Secondary | ICD-10-CM

## 2018-04-19 NOTE — Patient Instructions (Signed)
It was great seeing you today   We will follow up with you regarding your blood work   Continue to stay active and eat healthy  

## 2018-04-19 NOTE — Progress Notes (Signed)
Subjective:    Patient ID: Erik Lara, male    DOB: 11-26-1935, 83 y.o.   MRN: [id number]  HPI Patient presents for yearly preventative medicine examination. Pleasant 83 year old male who  has a past medical history of Cysts, Diabetes mellitus ([place]), Gallstones, Hyperlipidemia, Obesity, and Prostatitis.   DM -controlled with metformin.  He does monitor his blood sugars at home and gets readings consistently in the 120s to 130s.  He denies any symptoms of hypoglycemia Lab Results  Component Value Date   HGBA1C 7.0 (H) 04/18/2017   Hyperlipidemia -prescribed Zocor and aspirin Lab Results  Component Value Date   CHOL 115 04/18/2017   HDL 45.70 04/18/2017   LDLCALC 54 04/18/2017   TRIG 75.0 04/18/2017   CHOLHDL 3 04/18/2017   He was seen by neuropsychiatry in June 2019 at the recommendation of his neurologist Dr. Delice Lesch.  Was to assess his current level of cognitive functioning and to assist with differential diagnosis.  Per neuropsychiatry his exam was quite reassuring.  He demonstrated mostly stable scores with some improvement performances.  At this time he continue to demonstrate relative weakness and verbal fluency and he demonstrated difficulty with auditory comprehension of complex [place].  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 He continues to take Aricept 10 mg but feels as though his short term memory has been declining. He is now having to place his cane, wallet, and car keys in the same place so he does not forget where he placed them. He has a follow up with Neurology coming up.    All immunizations and health maintenance protocols were reviewed with the patient and needed orders were placed. utd  Appropriate screening laboratory values were ordered for the patient including screening of hyperlipidemia, renal function and hepatic function. If indicated by BPH, a PSA was ordered.  Medication reconciliation,  past medical  history, social history, problem list and allergies were reviewed in detail with the patient  Goals were established with regard to weight loss, exercise, and  diet in compliance with medications  End of life planning was discussed. He has an advanced directive and living will   Review of Systems  Constitutional: Negative.   HENT: Negative.   Eyes: Negative.   Respiratory: Negative.   Cardiovascular: Negative.   Gastrointestinal: Negative.   Endocrine: Negative.   Genitourinary: Negative.   Musculoskeletal: Positive for gait problem.  Skin: Negative.   Allergic/Immunologic: Negative.   Hematological: Negative.   Psychiatric/Behavioral: Positive for confusion.  All other systems reviewed and are negative.  Past Medical History:  Diagnosis Date  . Cysts    liver  . Diabetes mellitus ([place])   . Gallstones    asymptomatic  . Hyperlipidemia   . Obesity   . Prostatitis    requiring a trip to the ER in [place] with a fever - 09    Social History   Socioeconomic History  . Marital status: Married    Spouse name: Not on file  . Number of children: 3  . Years of education: Not on file  . Highest education level: Not on file  Occupational History  . Occupation: Retired  Scientific laboratory technician  . Financial resource strain: Not on file  . Food insecurity:    Worry: Not on file    Inability: Not on file  . Transportation needs:    Medical: Not on file    Non-medical: Not on file  Tobacco Use  .  Smoking status: Never Smoker  . Smokeless tobacco: Never Used  Substance and Sexual Activity  . Alcohol use: Yes    Alcohol/week: 0.0 standard drinks    Comment: Beer weekly/glass of wine daily  . Drug use: No  . Sexual activity: Not on file  Lifestyle  . Physical activity:    Days per week: Not on file    Minutes per session: Not on file  . Stress: Not on file  Relationships  . Social connections:    Talks on phone: Not on file    Gets together: Not on file    Attends  religious service: Not on file    Active member of club or organization: Not on file    Attends meetings of clubs or organizations: Not on file    Relationship status: Not on file  . Intimate partner violence:    Fear of current or ex partner: Not on file    Emotionally abused: Not on file    Physically abused: Not on file    Forced sexual activity: Not on file  Other Topics Concern  . Not on file  Social History Narrative  . Not on file    Past Surgical History:  Procedure Laterality Date  . HERNIA REPAIR     Childhood    Family History  Problem Relation Age of Onset  . Cancer Other        breast  . Diabetes Other     No Known Allergies  Current Outpatient Medications on File Prior to Visit  Medication Sig Dispense Refill  . aspirin 81 MG tablet Take 81 mg by mouth daily.    . cetirizine (ZYRTEC) 10 MG tablet Take 10 mg by mouth daily.    Marland Kitchen donepezil (ARICEPT) 5 MG tablet TAKE ONE TABLET BY MOUTH AT BEDTIME 90 tablet 2  . Lancets (FREESTYLE) lancets USE ONCE DAILY 100 each 3  . metFORMIN (GLUCOPHAGE) 500 MG tablet TAKE ONE-HALF TABLET BY MOUTH EVERY MORNING 45 tablet 0  . simvastatin (ZOCOR) 20 MG tablet TAKE 1 TABLET (20 MG TOTAL) BY MOUTH AT BEDTIME. 90 tablet 3   No current facility-administered medications on file prior to visit.     BP 136/70   Temp 98.3 F (36.8 C)   Wt 217 lb (98.4 kg)   BMI 27.86 kg/m       Objective:   Physical Exam Vitals signs and nursing note reviewed.  Constitutional:      General: He is not in acute distress.    Appearance: Normal appearance. He is well-developed and normal weight. He is not ill-appearing or diaphoretic.  HENT:     Head: Normocephalic and atraumatic.     Right Ear: Tympanic membrane, ear canal and external ear normal. There is no impacted cerumen.     Left Ear: Tympanic membrane and external ear normal. There is no impacted cerumen.     Nose: Nose normal. No congestion or rhinorrhea.     Mouth/Throat:      Mouth: Mucous membranes are moist.     Pharynx: Oropharynx is clear. No oropharyngeal exudate.  Eyes:     General:        Right eye: No discharge.        Left eye: No discharge.     Extraocular Movements: Extraocular movements intact.     Conjunctiva/sclera: Conjunctivae normal.     Pupils: Pupils are equal, round, and reactive to light.  Neck:     Musculoskeletal: Normal range of  motion and neck supple.     Thyroid: No thyromegaly.     Trachea: No tracheal deviation.  Cardiovascular:     Rate and Rhythm: Normal rate and regular rhythm.     Pulses: Normal pulses.     Heart sounds: Normal heart sounds. No murmur. No friction rub. No gallop.   Pulmonary:     Effort: Pulmonary effort is normal. No respiratory distress.     Breath sounds: Normal breath sounds. No stridor. No wheezing, rhonchi or rales.  Chest:     Chest wall: No tenderness.  Abdominal:     General: Bowel sounds are normal. There is no distension.     Palpations: Abdomen is soft. There is no mass.     Tenderness: There is no abdominal tenderness. There is no right CVA tenderness, left CVA tenderness, guarding or rebound.     Hernia: No hernia is present.  Musculoskeletal: Normal range of motion.        General: No swelling, tenderness, deformity or signs of injury.     Right lower leg: No edema.     Left lower leg: No edema.  Lymphadenopathy:     Cervical: No cervical adenopathy.  Skin:    General: Skin is warm and dry.     Coloration: Skin is not jaundiced or pale.     Findings: No bruising, erythema, lesion or rash.  Neurological:     General: No focal deficit present.     Mental Status: He is alert and oriented to person, place, and time. Mental status is at baseline.     Cranial Nerves: No cranial nerve deficit.     Coordination: Coordination abnormal.     Gait: Gait abnormal.     Comments: Shuffling gait    Psychiatric:        Mood and Affect: Mood normal.        Behavior: Behavior normal.         Thought Content: Thought content normal.        Judgment: Judgment normal.     Comments: No signs of confusion or short term memory loss noted during exam        Assessment & Plan:  1. Routine general medical examination at a health care facility  - CBC with Differential/Platelet - Comprehensive metabolic panel - Hemoglobin A1c - Lipid panel - TSH  2. Mild cognitive impairment - Follow up with neurology as directed  - I would like him to only drive short distances and not drive at night  - CBC with Differential/Platelet - Comprehensive metabolic panel - Hemoglobin A1c - Lipid panel - TSH  3. Type 2 diabetes mellitus without complication, without long-term current use of insulin (H[facility]) - Consider increase in metformin  - CBC with Differential/Platelet - Comprehensive metabolic panel - Hemoglobin A1c - Lipid panel - TSH - Microalbumin / creatinine urine ratio  4. Hyperlipidemia, unspecified hyperlipidemia type - Consider increase in statin  - CBC with Differential/Platelet - Comprehensive metabolic panel - Hemoglobin A1c - Lipid panel - TSH  Dorothyann Peng, NP

## 2018-04-23 ENCOUNTER — Other Ambulatory Visit: Payer: Self-pay | Admitting: Adult Health

## 2018-04-24 NOTE — Telephone Encounter (Signed)
Erik Lara, pt did not have lab work done before leaving.  I called and have him scheduled for 04/26/2018.  Will come fasting.  Ok to fill for 1 year?

## 2018-04-24 NOTE — Telephone Encounter (Signed)
Ok to fill for one year

## 2018-04-25 NOTE — Telephone Encounter (Signed)
Sent to the pharmacy by e-scribe. 

## 2018-04-26 ENCOUNTER — Other Ambulatory Visit: Payer: PPO

## 2018-04-26 ENCOUNTER — Ambulatory Visit: Payer: PPO | Admitting: Physical Therapy

## 2018-04-26 DIAGNOSIS — M25562 Pain in left knee: Secondary | ICD-10-CM

## 2018-04-26 DIAGNOSIS — M6281 Muscle weakness (generalized): Secondary | ICD-10-CM

## 2018-04-26 DIAGNOSIS — M25662 Stiffness of left knee, not elsewhere classified: Secondary | ICD-10-CM

## 2018-04-26 LAB — CBC WITH DIFFERENTIAL/PLATELET
BASOS ABS: 0 10*3/uL (ref 0.0–0.1)
Basophils Relative: 0.5 % (ref 0.0–3.0)
EOS PCT: 2.5 % (ref 0.0–5.0)
Eosinophils Absolute: 0.2 10*3/uL (ref 0.0–0.7)
HCT: 43 % (ref 39.0–52.0)
Hemoglobin: 14.5 g/dL (ref 13.0–17.0)
LYMPHS ABS: 1.1 10*3/uL (ref 0.7–4.0)
Lymphocytes Relative: 18.6 % (ref 12.0–46.0)
MCHC: 33.6 g/dL (ref 30.0–36.0)
MCV: 89.4 fl (ref 78.0–100.0)
MONOS PCT: 8.8 % (ref 3.0–12.0)
Monocytes Absolute: 0.5 10*3/uL (ref 0.1–1.0)
NEUTROS ABS: 4.2 10*3/uL (ref 1.4–7.7)
NEUTROS PCT: 69.6 % (ref 43.0–77.0)
PLATELETS: 248 10*3/uL (ref 150.0–400.0)
RBC: 4.81 Mil/uL (ref 4.22–5.81)
RDW: 13.4 % (ref 11.5–15.5)
WBC: 6 10*3/uL (ref 4.0–10.5)

## 2018-04-26 LAB — COMPREHENSIVE METABOLIC PANEL
ALT: 20 U/L (ref 0–53)
AST: 21 U/L (ref 0–37)
Albumin: 4 g/dL (ref 3.5–5.2)
Alkaline Phosphatase: 71 U/L (ref 39–117)
BUN: 12 mg/dL (ref 6–23)
CHLORIDE: 104 meq/L (ref 96–112)
CO2: 29 meq/L (ref 19–32)
Calcium: 9.3 mg/dL (ref 8.4–10.5)
Creatinine, Ser: 0.69 mg/dL (ref 0.40–1.50)
GFR: 110.02 mL/min (ref >60.00)
GLUCOSE: 137 mg/dL — AB (ref 70–99)
POTASSIUM: 4.3 meq/L (ref 3.5–5.1)
Sodium: 139 mEq/L (ref 135–145)
Total Bilirubin: 1.1 mg/dL (ref 0.2–1.2)
Total Protein: 6.1 g/dL (ref 6.0–8.3)

## 2018-04-26 LAB — HEMOGLOBIN A1C: Hgb A1c MFr Bld: 6.5 % (ref 4.6–6.5)

## 2018-04-26 LAB — MICROALBUMIN / CREATININE URINE RATIO
CREATININE, U: 91 mg/dL
MICROALB/CREAT RATIO: 0.8 mg/g (ref 0.0–30.0)
Microalb, Ur: 0.7 mg/dL (ref 0.0–1.9)

## 2018-04-26 LAB — LIPID PANEL
CHOL/HDL RATIO: 3
Cholesterol: 139 mg/dL (ref 0–200)
HDL: 43.5 mg/dL (ref >39.00)
LDL CALC: 78 mg/dL (ref 0–99)
NonHDL: 95.28
TRIGLYCERIDES: 86 mg/dL (ref 0.0–149.0)
VLDL: 17.2 mg/dL (ref 0.0–40.0)

## 2018-04-26 LAB — TSH: TSH: 0.39 u[IU]/mL (ref 0.35–4.50)

## 2018-04-26 NOTE — Therapy (Signed)
[facility] [address] [place], [place], [address] Phone: [phone]   Fax:  [phone]  Physical Therapy Treatment  Patient Details  Name: Erik Lara MRN: [id number] Date of Birth: 1935/12/23 Referring Provider (PT): Billie Ruddy   Encounter Date: 04/26/2018  PT End of Session - 04/26/18 1122    Visit Number  8    Date for PT Re-Evaluation  05/08/18    PT Start Time  1102    PT Stop Time  1142    PT Time Calculation (min)  40 min    Activity Tolerance  Patient tolerated treatment well    Behavior During Therapy  [facility] for tasks assessed/performed       Past Medical History:  Diagnosis Date  . Cysts    liver  . Diabetes mellitus ([place])   . Gallstones    asymptomatic  . Hyperlipidemia   . Obesity   . Prostatitis    requiring a trip to the ER in [place] with a fever - 09    Past Surgical History:  Procedure Laterality Date  . HERNIA REPAIR     Childhood    There were no vitals filed for this visit.  Subjective Assessment - 04/26/18 1125    Subjective  Pt states he hasn't used his cane for one week.  States he feels at least 90% better.    Pertinent History  history of falls, memory problems    Limitations  Walking;Standing    Diagnostic tests  x-ray of knee showing nothing significant    Patient Stated Goals  be able to go from sitting to walking without tightness, decreased cramping and tightness in the leg    Currently in Pain?  No/denies         [facility] PT Assessment - 04/26/18 0001      Standardized Balance Assessment   Standardized Balance Assessment  Five Times Sit to Stand    Five times sit to stand comments   15 sec average (22, 12, 12)      Timed Up and Go Test   TUG Comments  13 sec                   [facility] Adult PT Treatment/Exercise - 04/26/18 0001      Lumbar Exercises: Aerobic   Nustep  L3 x 8 min, PT present to discuss progress       Lumbar  Exercises: Seated   Long Arc Quad on Chair  Strengthening;Left;20 reps;Weights    LAQ on Chair Weights (lbs)  2    Sit to Stand  20 reps   elevated surface, no UEx10;    Other Seated Lumbar Exercises  flexion rolling and SB with large pball - 10x each       Knee/Hip Exercises: Standing   Hip Flexion  Stengthening;Right;Left;10 reps   2lb   Hip ADduction  Strengthening;Right;Left;10 reps   2lb              PT Short Term Goals - 04/12/18 1512      PT SHORT TERM GOAL #1   Title  pt will perform TUG and appropriate goal will be made to work on improved gait speed and agility for reduced risk of falls    Period  Weeks    Status  Achieved        PT Long Term Goals - 04/26/18 1113      PT LONG TERM GOAL #1  Title  Pt will be ind with advanced HEP    Status  On-going      PT LONG TERM GOAL #2   Title  Pt will report 50% greater ease when standing after sitting at his desk    Baseline  90- 95% easier    Status  Achieved      PT LONG TERM GOAL #3   Title  Pt will demonstrate 5 x sit to stand  < 15 seconds for decreased risk of falls    Baseline  22, 12, 12    Status  Achieved      PT LONG TERM GOAL #5   Title  TUG < or = to 18 sec for reduced risk of falls    Baseline  14, 13, 12 - ave of 13 sec    Status  Achieved            Plan - 04/26/18 1126    Clinical Impression Statement  Pt demonstrates good progress towards his goals.  His pain has improved significantly and he has improved TUG and 5 x sit to stand to exceed his goals.  At this time he still demonstrates very flexed posture and difficulty picking his feet up for good foot clearance    PT Treatment/Interventions  ADLs/Self Care Home Management;Cryotherapy;Electrical Stimulation;Iontophoresis 4mg /ml Dexamethasone;Moist Heat;Gait training;Stair training;Functional mobility training;Therapeutic activities;Therapeutic exercise;Neuromuscular re-education;Patient/family education;Dry needling;Manual techniques     PT Next Visit Plan  balance and LE strength, core strength, log rolling and posture       Patient will benefit from skilled therapeutic intervention in order to improve the following deficits and impairments:  Abnormal gait, Decreased range of motion, Difficulty walking, Increased fascial restricitons, Increased muscle spasms, Pain, Decreased balance, Impaired flexibility, Decreased strength, Postural dysfunction  Visit Diagnosis: Acute pain of left knee  Stiffness of left knee, not elsewhere classified  Muscle weakness (generalized)     Problem List Patient Active Problem List   Diagnosis Date Noted  . Hearing loss of right ear 01/23/2017  . Mild cognitive impairment 09/13/2016  . Type 2 diabetes mellitus without complication, without long-term current use of insulin ([place]) 03/30/2015  . Short-term memory loss 02/03/2015  . H/O hyperthyroidism 01/28/2013  . ERECTILE DYSFUNCTION, MILD 02/19/2008  . Allergic rhinitis 02/19/2008  . Hyperlipidemia 02/15/2007  . HEPATIC CYST 02/15/2007    Zannie Cove, PT 04/26/2018, 1:51 PM  [facility] [address], [place], [place], [address] Phone: [phone]   Fax:  [phone]  Name: Erik Lara MRN: [id number] Date of Birth: [birth year]

## 2018-04-27 ENCOUNTER — Other Ambulatory Visit: Payer: Self-pay

## 2018-04-27 ENCOUNTER — Other Ambulatory Visit: Payer: Self-pay | Admitting: Adult Health

## 2018-04-27 ENCOUNTER — Encounter

## 2018-04-27 ENCOUNTER — Encounter: Payer: Self-pay | Admitting: Neurology

## 2018-04-27 ENCOUNTER — Ambulatory Visit: Payer: PPO | Admitting: Neurology

## 2018-04-27 VITALS — BP 150/78 | HR 89 | Ht 73.0 in | Wt 219.0 lb

## 2018-04-27 DIAGNOSIS — G3184 Mild cognitive impairment, so stated: Secondary | ICD-10-CM

## 2018-04-27 MED ORDER — DONEPEZIL HCL 10 MG PO TABS: 10.0000 mg | ORAL_TABLET | Freq: Every day | ORAL | 3 refills | Status: DC

## 2018-04-27 NOTE — Progress Notes (Signed)
NEUROLOGY FOLLOW UP OFFICE NOTE  Erik Lara [id number]  DOB: September 20, 1935  HISTORY OF PRESENT ILLNESS: I had the pleasure of seeing Erik Lara in follow-up in the neurology clinic on 04/27/2018.  The patient was last seen a year ago for worsening memory. He underwent repeat Neuropsychological testing in June 2019 with an impression of Non-amnestic MCI (stable from 2018). There was no evidence of an underlying memory disorder or Alzheimer's disease. He reported improved anxiety. Since his last visit, he reports memory is "spotty." He would not remember something for very long if he steps out of the room. He states he has stopped night time driving but denies any difficulties during the day. He tells me his wife does not want him to drive, but is unclear if this is during daytime as well. He has missed a few bill payments. He is pretty good with medications and sets up his pillbox weekly. He has had 3 falls in the past year and is undergoing PT for his left knee. He has not used a cane in 3 weeks. He denies any headaches, dizziness, vision changes. He appears to have more word-finding difficulties today.   HPI 03/30/15: This is a pleasant 83 yo RH man with a history of hyperlipidemia, diabetes, who presented for memory loss. He started noticing changes around 1-1/2 years ago, he would notice his memory would be "spotty," with a blank spot occurring once in a while. He reported this to his PCP, then again on his next follow-up visit last November 2016. He was concerned about an episode [place] a familiar place, when he could not recall which exit to take. He missed the exit and had to turn around. He has noticed moments while driving where he briefly has a blank spot ("new garden or spring garden") then quickly passes. He has been told by his wife that he repeats himself. He has always had word-finding difficulties, but noticed this has worsened. He  infrequently misses bill payments, stating he has a record system, but would miss paying if he gets very busy. He denies any missed medications. He denies any family history of dementia, history of head injury or alcohol use.  He has had poor sense of smell for many years. He has noticed some difficulty reading from afar, better if he looks closer. He has occasional cramping in the calf muscles, left>right, worse at night. He denies any falls. He goes to exercise class 3-4 times a week.   PAST MEDICAL HISTORY: Past Medical History:  Diagnosis Date  . Cysts    liver  . Diabetes mellitus ([place])   . Gallstones    asymptomatic  . Hyperlipidemia   . Obesity   . Prostatitis    requiring a trip to the ER in [place] with a fever - 09    MEDICATIONS: Current Outpatient Medications on File Prior to Visit  Medication Sig Dispense Refill  . aspirin 81 MG tablet Take 81 mg by mouth daily.    . cetirizine (ZYRTEC) 10 MG tablet Take 10 mg by mouth daily.    Marland Kitchen donepezil (ARICEPT) 5 MG tablet TAKE ONE TABLET BY MOUTH AT BEDTIME 90 tablet 2  . Lancets (FREESTYLE) lancets USE ONCE DAILY 100 each 3  . metFORMIN (GLUCOPHAGE) 500 MG tablet TAKE ONE-HALF TABLET BY MOUTH EVERY MORNING 45 tablet 0  . simvastatin (ZOCOR) 20 MG tablet TAKE ONE TABLET BY MOUTH DAILY AT BEDTIME 90 tablet 3  No current facility-administered medications on file prior to visit.     ALLERGIES: No Known Allergies  FAMILY HISTORY: Family History  Problem Relation Age of Onset  . Cancer Other        breast  . Diabetes Other     SOCIAL HISTORY: Social History   Socioeconomic History  . Marital status: Married    Spouse name: Not on file  . Number of children: 3  . Years of education: Not on file  . Highest education level: Not on file  Occupational History  . Occupation: Retired  Scientific laboratory technician  . Financial resource strain: Not on file  . Food insecurity:    Worry: Not on file    Inability: Not on file  .  Transportation needs:    Medical: Not on file    Non-medical: Not on file  Tobacco Use  . Smoking status: Never Smoker  . Smokeless tobacco: Never Used  Substance and Sexual Activity  . Alcohol use: Yes    Alcohol/week: 0.0 standard drinks    Comment: Beer weekly/glass of wine daily  . Drug use: No  . Sexual activity: Not on file  Lifestyle  . Physical activity:    Days per week: Not on file    Minutes per session: Not on file  . Stress: Not on file  Relationships  . Social connections:    Talks on phone: Not on file    Gets together: Not on file    Attends religious service: Not on file    Active member of club or organization: Not on file    Attends meetings of clubs or organizations: Not on file    Relationship status: Not on file  . Intimate partner violence:    Fear of current or ex partner: Not on file    Emotionally abused: Not on file    Physically abused: Not on file    Forced sexual activity: Not on file  Other Topics Concern  . Not on file  Social History Narrative  . Not on file    REVIEW OF SYSTEMS: Constitutional: No fevers, chills, or sweats, no generalized fatigue, change in appetite Eyes: No visual changes, double vision, eye pain Ear, nose and throat: No hearing loss, ear pain, nasal congestion, sore throat Cardiovascular: No chest pain, palpitations Respiratory:  No shortness of breath at rest or with exertion, wheezes GastrointestinaI: No nausea, vomiting, diarrhea, abdominal pain, fecal incontinence Genitourinary:  No dysuria, urinary retention or frequency Musculoskeletal:  No neck pain, back pain Integumentary: No rash, pruritus, skin lesions Neurological: as above Psychiatric: No depression, insomnia, anxiety Endocrine: No palpitations, fatigue, diaphoresis, mood swings, change in appetite, change in weight, increased thirst Hematologic/Lymphatic:  No anemia, purpura, petechiae. Allergic/Immunologic: no itchy/runny eyes, nasal congestion,  recent allergic reactions, rashes  PHYSICAL EXAM: Vitals:   04/27/18 0833  BP: (!) 150/78  Pulse: 89  SpO2: 95%   General: No acute distress Head:  Normocephalic/atraumatic Neck: supple, no paraspinal tenderness, full range of motion Heart:  Regular rate and rhythm Lungs:  Clear to auscultation bilaterally Back: No paraspinal tenderness Skin/Extremities: No rash, no edema Neurological Exam: alert and oriented to person, place, and time. No aphasia or dysarthria. Fund of knowledge is appropriate.  Remote memory intact. Attention and concentration are normal.    Able to name objects, difficulty with repetition. Decreased fluency. Montreal Cognitive Assessment  04/27/2018  Visuospatial/ Executive (0/5) 2  Naming (0/3) 3  Attention: Read list of digits (0/2) 1  Attention:  Read list of letters (0/1) 0  Attention: Serial 7 subtraction starting at 100 (0/3) 1  Language: Repeat phrase (0/2) 0  Language : Fluency (0/1) 0  Abstraction (0/2) 0  Delayed Recall (0/5) 2  Orientation (0/6) 6  Total 15   MMSE - Mini Mental State Exam 03/29/2017 03/30/2015  Orientation to time 5 5  Orientation to Place 5 5  Registration 3 3  Attention/ Calculation 4 4  Recall 3 2  Language- name 2 objects 2 2  Language- repeat 1 1  Language- follow 3 step command 3 3  Language- read & follow direction 1 1  Write a sentence 1 1  Copy design 1 1  Total score 29 28   Cranial nerves: Pupils equal, round, reactive to light.  Extraocular movements intact with no nystagmus. Visual fields full. Facial sensation intact. No facial asymmetry. Tongue, uvula, palate midline.  Motor: Bulk and tone normal, muscle strength 5/5 throughout with no pronator drift.  Sensation to light touch intact.  No extinction to double simultaneous stimulation.  Finger to nose testing intact.  Gait slow and cautious favoring left knee today.   IMPRESSION: This is a pleasant 83 yo RH man with a vascular risk factors including diabetes,  hyperlipidemia, who presented last January 2017 for memory loss. Repeat Neuropsychological testing in June 2019 did not show any decline from testing in 2018, there was no evidence of dementia at that time. His MOCA score however today is 15/30. We discussed increasing Donepezil to 10mg  daily. We also discussed driving, there may be some concerns now from his wife, if this becomes an issue, discussed doing an on-road driving evaluation. We again discussed the importance of control of vascular risk factors, physical exercise, and brain stimulation exercises for brain health. He will follow-up in 6 months and was advised to have his wife present for his next follow-up to provide additional information.  Thank you for allowing me to participate in his care.  Please do not hesitate to call for any questions or concerns.  The duration of this appointment visit was 30 minutes of face-to-face time with the patient.  Greater than 50% of this time was spent in counseling, explanation of diagnosis, planning of further management, and coordination of care.   Ellouise Newer, M.D.   CC: Dorothyann Peng, NP

## 2018-04-27 NOTE — Patient Instructions (Signed)
1. Increase Donepezil to 10mg  daily. With your current bottle of Donepezil 5mg , take 2 tablets daily. Once done with this, your new prescription will be for Donepezil 10mg , take 1 tablet daily  2. If your wife reports daytime driving concerns, would recommend having a driving evaluation done.  3. Follow-up in 6 months, call for any changes   RECOMMENDATIONS FOR ALL PATIENTS WITH MEMORY PROBLEMS: 1. Continue to exercise (Recommend 30 minutes of walking everyday, or 3 hours every week) 2. Increase social interactions - continue going to [place] and enjoy social gatherings with friends and family 3. Eat healthy, avoid fried foods and eat more fruits and vegetables 4. Maintain adequate blood pressure, blood sugar, and blood cholesterol level. Reducing the risk of stroke and cardiovascular disease also helps promoting better memory. 5. Avoid stressful situations. Live a simple life and avoid aggravations. Organize your time and prepare for the next day in anticipation. 6. Sleep well, avoid any interruptions of sleep and avoid any distractions in the bedroom that may interfere with adequate sleep quality 7. Avoid sugar, avoid sweets as there is a strong link between excessive sugar intake, diabetes, and cognitive impairment The Mediterranean diet has been shown to help patients reduce the risk of progressive memory disorders and reduces cardiovascular risk. This includes eating fish, eat fruits and green leafy vegetables, nuts like almonds and hazelnuts, walnuts, and also use olive oil. Avoid fast foods and fried foods as much as possible. Avoid sweets and sugar as sugar use has been linked to worsening of memory function.

## 2018-05-01 ENCOUNTER — Ambulatory Visit: Payer: PPO | Admitting: Physical Therapy

## 2018-05-01 DIAGNOSIS — M25562 Pain in left knee: Secondary | ICD-10-CM | POA: Diagnosis not present

## 2018-05-01 DIAGNOSIS — M25662 Stiffness of left knee, not elsewhere classified: Secondary | ICD-10-CM

## 2018-05-01 DIAGNOSIS — M6281 Muscle weakness (generalized): Secondary | ICD-10-CM

## 2018-05-01 NOTE — Therapy (Addendum)
[facility] [address] [place], [place], [address] Phone: [phone]   Fax:  [phone]  Physical Therapy Treatment  Patient Details  Name: Erik Lara MRN: [id number] Date of Birth: [birth year] Referring Provider (PT): Billie Ruddy   Encounter Date: 05/01/2018  PT End of Session - 05/01/18 1137    Visit Number  9    Date for PT Re-Evaluation  05/08/18    PT Start Time  1100    PT Stop Time  1139    PT Time Calculation (min)  39 min    Activity Tolerance  Patient tolerated treatment well    Behavior During Therapy  [facility] for tasks assessed/performed       Past Medical History:  Diagnosis Date  . Cysts    liver  . Diabetes mellitus ([place])   . Gallstones    asymptomatic  . Hyperlipidemia   . Obesity   . Prostatitis    requiring a trip to the ER in [place] with a fever - 09    Past Surgical History:  Procedure Laterality Date  . HERNIA REPAIR     Childhood    There were no vitals filed for this visit.  Subjective Assessment - 05/01/18 1101    Subjective  Pt states he still hasn't needed the cane.  He reports he went to his regular exercise class yesterday.  He states he still gets "cramping" down the front of Lt lower leg, but it goes away after walking a little.    Pertinent History  history of falls, memory problems    Patient Stated Goals  be able to go from sitting to walking without tightness, decreased cramping and tightness in the leg                       [facility] Adult PT Treatment/Exercise - 05/01/18 0001      Lumbar Exercises: Aerobic   Nustep  L3 x 8 min, PT present to discuss progress       Lumbar Exercises: Seated   Long Arc Quad on Chair  Strengthening;Left;20 reps;Weights    LAQ on Chair Weights (lbs)  2    Sit to Stand  20 reps   elevated surface, no UEx10;    Other Seated Lumbar Exercises  flexion rolling and SB with large pball - 10x each       Knee/Hip Exercises: Standing   Hip Flexion  Stengthening;Right;Left;10 reps   2.5lb   Hip ADduction  Strengthening;Right;Left;10 reps   2.5lb   Forward Step Up  Left;20 reps;Hand Hold: 2;Step Height: 6"   discussed doing step up on left at home for improved LtLE               PT Short Term Goals - 04/12/18 1512      PT SHORT TERM GOAL #1   Title  pt will perform TUG and appropriate goal will be made to work on improved gait speed and agility for reduced risk of falls    Period  Weeks    Status  Achieved        PT Long Term Goals - 05/01/18 1104      PT LONG TERM GOAL #1   Title  Pt will be ind with advanced HEP    Status  Achieved      PT LONG TERM GOAL #2   Title  Pt will report 50% greater ease when standing after sitting  at his desk    Status  Achieved      PT LONG TERM GOAL #3   Title  Pt will demonstrate 5 x sit to stand  < 15 seconds for decreased risk of falls    Status  Achieved      PT LONG TERM GOAL #4   Title  Pt will be able to begin a walking routine of 10 minutes per days and understanding how to safely build up time    Baseline  I am walking at least 10 minutes a day but not outside; pt will walk when able with weather    Status  Achieved      PT LONG TERM GOAL #5   Title  TUG < or = to 18 sec for reduced risk of falls    Status  Achieved            Plan - 05/01/18 1139    Clinical Impression Statement  Pt is doing well.  He has met all long term goals as of today.  He is ind with HEP and is recommended to discharge with HEP.    PT Treatment/Interventions  ADLs/Self Care Home Management;Cryotherapy;Electrical Stimulation;Iontophoresis 46m/ml Dexamethasone;Moist Heat;Gait training;Stair training;Functional mobility training;Therapeutic activities;Therapeutic exercise;Neuromuscular re-education;Patient/family education;Dry needling;Manual techniques    PT Next Visit Plan  discharged today    PT Home Exercise Plan  [id number]     Consulted and  Agree with Plan of Care  Patient       Patient will benefit from skilled therapeutic intervention in order to improve the following deficits and impairments:  Abnormal gait, Decreased range of motion, Difficulty walking, Increased fascial restricitons, Increased muscle spasms, Pain, Decreased balance, Impaired flexibility, Decreased strength, Postural dysfunction  Visit Diagnosis: Acute pain of left knee  Stiffness of left knee, not elsewhere classified  Muscle weakness (generalized)     Problem List Patient Active Problem List   Diagnosis Date Noted  . Hearing loss of right ear 01/23/2017  . Mild cognitive impairment 09/13/2016  . Type 2 diabetes mellitus without complication, without long-term current use of insulin ([place] 03/30/2015  . Short-term memory loss 02/03/2015  . H/O hyperthyroidism 01/28/2013  . ERECTILE DYSFUNCTION, MILD 02/19/2008  . Allergic rhinitis 02/19/2008  . Hyperlipidemia 02/15/2007  . HEPATIC CYST 02/15/2007    JZannie Cove PT 05/01/2018, 11:45 AM  [facility] [address] [place] [address]Phone: [phone]  Fax:  [phone] Name: RJESSEN SIEGMANMRN: [id number] of Birth: [birth year] PHYSICAL THERAPY DISCHARGE SUMMARY  Visits from Start of Care: 9  Current functional level related to goals / functional outcomes: See above   Remaining deficits: See above   Education / Equipment: HEP Plan: Patient agrees to discharge.  Patient goals were met. Patient is being discharged due to being pleased with the current functional level.  ?????     [employer] PT 05/28/18 4:11 PM

## 2018-05-03 ENCOUNTER — Ambulatory Visit: Payer: PPO | Admitting: Physical Therapy

## 2018-05-08 ENCOUNTER — Ambulatory Visit: Payer: PPO | Admitting: Physical Therapy

## 2018-07-02 ENCOUNTER — Other Ambulatory Visit: Payer: Self-pay | Admitting: Adult Health

## 2018-07-03 NOTE — Telephone Encounter (Signed)
Ok to refill 

## 2018-07-03 NOTE — Telephone Encounter (Signed)
Sent to the pharmacy by e-scribe. 

## 2018-08-07 DIAGNOSIS — L821 Other seborrheic keratosis: Secondary | ICD-10-CM | POA: Diagnosis not present

## 2018-08-07 DIAGNOSIS — L570 Actinic keratosis: Secondary | ICD-10-CM | POA: Diagnosis not present

## 2018-09-24 ENCOUNTER — Other Ambulatory Visit: Payer: Self-pay | Admitting: Adult Health

## 2018-09-26 NOTE — Telephone Encounter (Signed)
Every 6 months

## 2018-09-26 NOTE — Telephone Encounter (Signed)
Sent to the pharmacy by e-scribe.  Reminder set to call pt in Aug for A1C and follow up.

## 2018-10-01 ENCOUNTER — Other Ambulatory Visit: Payer: Self-pay | Admitting: Adult Health

## 2018-10-03 NOTE — Telephone Encounter (Signed)
DENIED.  FILLED ON 09/26/2018 FOR 90 DAYS. MESSAGE SENT TO THE PHARMACY.

## 2018-10-05 ENCOUNTER — Other Ambulatory Visit: Payer: Self-pay | Admitting: Adult Health

## 2018-10-05 NOTE — Telephone Encounter (Signed)
DENIED.  SENT IN ON 09/26/2018 FOR 90 DAYS.  MESSAGE SENT TO THE PHARMACY.

## 2018-11-01 ENCOUNTER — Other Ambulatory Visit: Payer: Self-pay | Admitting: Family Medicine

## 2018-11-01 DIAGNOSIS — E119 Type 2 diabetes mellitus without complications: Secondary | ICD-10-CM

## 2018-11-06 ENCOUNTER — Other Ambulatory Visit: Payer: Self-pay

## 2018-11-06 ENCOUNTER — Other Ambulatory Visit (INDEPENDENT_AMBULATORY_CARE_PROVIDER_SITE_OTHER): Payer: PPO

## 2018-11-06 DIAGNOSIS — E119 Type 2 diabetes mellitus without complications: Secondary | ICD-10-CM

## 2018-11-06 LAB — HEMOGLOBIN A1C: Hgb A1c MFr Bld: 6.6 % — ABNORMAL HIGH (ref 4.6–6.5)

## 2018-11-07 ENCOUNTER — Ambulatory Visit (INDEPENDENT_AMBULATORY_CARE_PROVIDER_SITE_OTHER): Payer: PPO | Admitting: Adult Health

## 2018-11-07 ENCOUNTER — Encounter: Payer: Self-pay | Admitting: Adult Health

## 2018-11-07 DIAGNOSIS — E119 Type 2 diabetes mellitus without complications: Secondary | ICD-10-CM

## 2018-11-07 NOTE — Progress Notes (Signed)
Virtual Visit via Telephone Note  I connected with Erik Lara on 11/07/18 at  2:00 PM EDT by telephone and verified that I am speaking with the correct person using two identifiers.   I discussed the limitations, risks, security and privacy concerns of performing an evaluation and management service by telephone and the availability of in person appointments. I also discussed with the patient that there may be a patient responsible charge related to this service. The patient expressed understanding and agreed to proceed.  Location patient: home Location provider: work or home office Participants present for the call: patient, provider Patient did not have a visit in the prior 7 days to address this/these issue(s).   History of Present Illness: 83 year old male who is being evaluated today for follow-up regarding diabetes mellitus.  He is currently prescribed metformin 250 mg daily.  He does check his blood sugars on a routine basis and reports getting readings consistently in the 120s to 130s.  He denies any hypoglycemic episodes.  His wife cooks most of his meals, which are heart healthy.  Does stay fairly active working in his yard.  His last A1c in February was 6.5   Observations/Objective: Patient sounds cheerful and well on the phone. I do not appreciate any SOB. Speech and thought processing are grossly intact. Patient reported vitals:  Assessment and Plan: 1. Type 2 diabetes mellitus without complication, without long-term current use of insulin (HCC) A1c - 6.6  - No change in medications - Continue to stay active and eat a heart healthy diet   Follow Up Instructions:   I did not refer this patient for an OV in the next 24 hours for this/these issue(s).  I discussed the assessment and treatment plan with the patient. The patient was provided an opportunity to ask questions and all were answered. The patient agreed with the plan and demonstrated an understanding of the  instructions.   The patient was advised to call back or seek an in-person evaluation if the symptoms worsen or if the condition fails to improve as anticipated.  I provided 20 minutes of non-face-to-face time during this encounter.   Dorothyann Peng, NP

## 2018-11-09 ENCOUNTER — Encounter: Payer: Self-pay | Admitting: Neurology

## 2018-11-09 ENCOUNTER — Ambulatory Visit (INDEPENDENT_AMBULATORY_CARE_PROVIDER_SITE_OTHER): Payer: PPO | Admitting: Neurology

## 2018-11-09 ENCOUNTER — Other Ambulatory Visit: Payer: Self-pay

## 2018-11-09 VITALS — BP 154/81 | HR 80 | Ht 73.0 in | Wt 219.5 lb

## 2018-11-09 DIAGNOSIS — R2681 Unsteadiness on feet: Secondary | ICD-10-CM | POA: Diagnosis not present

## 2018-11-09 DIAGNOSIS — G3184 Mild cognitive impairment, so stated: Secondary | ICD-10-CM | POA: Diagnosis not present

## 2018-11-09 MED ORDER — DONEPEZIL HCL 10 MG PO TABS: 10.0000 mg | ORAL_TABLET | Freq: Every day | ORAL | 3 refills | Status: DC

## 2018-11-09 NOTE — Progress Notes (Signed)
NEUROLOGY FOLLOW UP OFFICE NOTE  Erik Lara [id number]  DOB: [birth year]  HISTORY OF PRESENT ILLNESS: I had the pleasure of seeing Erik Lara in follow-up in the neurology clinic on 11/09/2018.  The patient was last seen  6 months ago for worsening memory. He has had 2 Neuropsych evaluations, most recently in June 2019, which was unchanged from 2018, with impression of non-amnestic MCI. On his last visit however, his wife noted worsening memory, with decline in [facility] score (15/30 in February 2020). Donepezil dose increased to 10mg  daily, which he is tolerating without side effects. He is alone in the office today with no family to corroborate history, he states that he thought his memory was a little better with increase in Donepezil dose, however there are moments where his memory is not as good and he is more forgetful. He fills his pillbox and denies missing doses regularly. He states he is not as good with bills, he has missed maybe one or two in the past year. He only drives during the daytime, there are moments he knows where he is but does not know how he got there. Sleep is good. He had a fall a few months ago in the garden, no injuries. He states his mood is "sad due to too much political TV."     HPI 03/30/15: This is a pleasant 83 yo RH man with a history of hyperlipidemia, diabetes, who presented for memory loss. He started noticing changes around 1-1/2 years ago, he would notice his memory would be "spotty," with a blank spot occurring once in a while. He reported this to his PCP, then again on his next follow-up visit last November 2016. He was concerned about an episode [place] a familiar place, when he could not recall which exit to take. He missed the exit and had to turn around. He has noticed moments while driving where he briefly has a blank spot ("new garden or spring garden") then quickly passes. He has been told by his wife that he  repeats himself. He has always had word-finding difficulties, but noticed this has worsened. He infrequently misses bill payments, stating he has a record system, but would miss paying if he gets very busy. He denies any missed medications. He denies any family history of dementia, history of head injury or alcohol use.  He has had poor sense of smell for many years. He has noticed some difficulty reading from afar, better if he looks closer. He has occasional cramping in the calf muscles, left>right, worse at night. He denies any falls. He goes to exercise class 3-4 times a week.   PAST MEDICAL HISTORY: Past Medical History:  Diagnosis Date   Cysts    liver   Diabetes mellitus ([place])    Gallstones    asymptomatic   Hyperlipidemia    Obesity    Prostatitis    requiring a trip to the ER in [place] with a fever - 09    MEDICATIONS: Current Outpatient Medications on File Prior to Visit  Medication Sig Dispense Refill   aspirin 81 MG tablet Take 81 mg by mouth daily.     cetirizine (ZYRTEC) 10 MG tablet Take 10 mg by mouth daily.     donepezil (ARICEPT) 10 MG tablet Take 1 tablet (10 mg total) by mouth at bedtime. 90 tablet 3   Lancets (FREESTYLE) lancets USE ONCE DAILY 100 each 3   metFORMIN (GLUCOPHAGE) 500  MG tablet TAKE ONE-HALF TABLET BY MOUTH EVERY MORNING 45 tablet 0   simvastatin (ZOCOR) 20 MG tablet TAKE ONE TABLET BY MOUTH DAILY AT BEDTIME 90 tablet 3   No current facility-administered medications on file prior to visit.     ALLERGIES: No Known Allergies  FAMILY HISTORY: Family History  Problem Relation Age of Onset   Cancer Other        breast   Diabetes Other     SOCIAL HISTORY: Social History   Socioeconomic History   Marital status: Married    Spouse name: Not on file   Number of children: 3   Years of education: Not on file   Highest education level: Master's degree (e.g., MA, MS, MEng, MEd, MSW, MBA)  Occupational History    Occupation: Retired  Scientist, product/process development strain: Not on file   Food insecurity    Worry: Not on file    Inability: Not on Lexicographer needs    Medical: Not on file    Non-medical: Not on file  Tobacco Use   Smoking status: Never Smoker   Smokeless tobacco: Never Used  Substance and Sexual Activity   Alcohol use: Yes    Alcohol/week: 0.0 standard drinks    Comment: Beer weekly/glass of wine daily   Drug use: No   Sexual activity: Not Currently    Partners: Female  Lifestyle   Physical activity    Days per week: Not on file    Minutes per session: Not on file   Stress: Not on file  Relationships   Social connections    Talks on phone: Not on file    Gets together: Not on file    Attends religious service: Not on file    Active member of club or organization: Not on file    Attends meetings of clubs or organizations: Not on file    Relationship status: Not on file   Intimate partner violence    Fear of current or ex partner: Not on file    Emotionally abused: Not on file    Physically abused: Not on file    Forced sexual activity: Not on file  Other Topics Concern   Not on file  Social History Narrative   Lives with wife      Right handed      Master's Degree from [employer]: Constitutional: No fevers, chills, or sweats, no generalized fatigue, change in appetite Eyes: No visual changes, double vision, eye pain Ear, nose and throat: No hearing loss, ear pain, nasal congestion, sore throat Cardiovascular: No chest pain, palpitations Respiratory:  No shortness of breath at rest or with exertion, wheezes GastrointestinaI: No nausea, vomiting, diarrhea, abdominal pain, fecal incontinence Genitourinary:  No dysuria, urinary retention or frequency Musculoskeletal:  No neck pain, back pain Integumentary: No rash, pruritus, skin lesions Neurological: as above Psychiatric: No depression, insomnia, anxiety Endocrine: No  palpitations, fatigue, diaphoresis, mood swings, change in appetite, change in weight, increased thirst Hematologic/Lymphatic:  No anemia, purpura, petechiae. Allergic/Immunologic: no itchy/runny eyes, nasal congestion, recent allergic reactions, rashes  PHYSICAL EXAM: Vitals:   11/09/18 1000  BP: (!) 154/81  Pulse: 80  SpO2: 96%   General: No acute distress, tired-appearing, slightly disheveled appearance Head:  Normocephalic/atraumatic Skin/Extremities: No rash, no edema Neurological Exam: alert and oriented to person, place, and time. No aphasia or dysarthria. He is again noted to have word-finding difficulties. Fund of knowledge is appropriate.  Remote memory intact. Attention and concentration are normal.    Able to name objects, difficulty with repetition. Decreased fluency (similar to prior) [facility] Cognitive Assessment  11/09/2018 04/27/2018  Visuospatial/ Executive (0/5) 4 2  Naming (0/3) 3 3  Attention: Read list of digits (0/2) 1 1  Attention: Read list of letters (0/1) 1 0  Attention: Serial 7 subtraction starting at 100 (0/3) 1 1  Language: Repeat phrase (0/2) 0 0  Language : Fluency (0/1) 0 0  Abstraction (0/2) 1 0  Delayed Recall (0/5) 2 2  Orientation (0/6) 6 6  Total 19 15   MMSE - Mini Mental State Exam 03/29/2017 03/30/2015  Orientation to time 5 5  Orientation to Place 5 5  Registration 3 3  Attention/ Calculation 4 4  Recall 3 2  Language- name 2 objects 2 2  Language- repeat 1 1  Language- follow 3 step command 3 3  Language- read & follow direction 1 1  Write a sentence 1 1  Copy design 1 1  Total score 29 28   Cranial nerves: Pupils equal, round, reactive to light.  Extraocular movements intact with no nystagmus. Visual fields full. Facial sensation intact. No facial asymmetry. Tongue, uvula, palate midline.  Motor: Bulk and tone normal, muscle strength 5/5 throughout with no pronator drift.  Sensation to light touch intact.  No extinction to double  simultaneous stimulation.  Finger to nose testing intact.  Gait ambulates with head flexed down, slow and cautious, slightly unsteady  IMPRESSION: This is a pleasant 83 yo RH man with a vascular risk factors including diabetes, hyperlipidemia, who presented last January 2017 for memory loss. Repeat Neuropsychological testing in June 2019 did not show any decline from testing in 2018, there was no evidence of dementia at that time. On his follow-up in February 2020, there was noote of decline, Donepezil increased to 10mg  daily. He continues to report memory issues but there is no family to help corroborate history. MOCA score today 19/30 (15/30 in February 2020). Repeat Neuropsychological testing will be ordered. We discussed gait instability, hunched posture, he would benefit from gait and balance therapy. He was advised to have his wife present on follow-up visits. Follow-up in 6 months, he knows to call for any changes.   Thank you for allowing me to participate in his care.  Please do not hesitate to call for any questions or concerns.  The duration of this appointment visit was 30 minutes of face-to-face time with the patient.  Greater than 50% of this time was spent in counseling, explanation of diagnosis, planning of further management, and coordination of care.   Ellouise Newer, M.D.   CC: Dorothyann Peng, NP

## 2018-11-09 NOTE — Patient Instructions (Addendum)
1. Refer to PT for gait and balance therapy ([facility]). They will call you within 2 weeks and schedule the appointment.  2. Schedule repeat Neurocognitive testing. Our office will call you to schedule this in a few weeks.  3. Continue Donepezil 10mg  daily  4. Follow-up in 6 months, call for any changes  FALL PRECAUTIONS: Be cautious when walking. Scan the area for obstacles that may increase the risk of trips and falls. When getting up in the mornings, sit up at the edge of the bed for a few minutes before getting out of bed. Consider elevating the bed at the head end to avoid drop of blood pressure when getting up. Walk always in a well-lit room (use night lights in the walls). Avoid area rugs or power cords from appliances in the middle of the walkways. Use a walker or a cane if necessary and consider physical therapy for balance exercise. Get your eyesight checked regularly.  FINANCIAL OVERSIGHT: Supervision, especially oversight when making financial decisions or transactions is also recommended.  HOME SAFETY: Consider the safety of the kitchen when operating appliances like stoves, microwave oven, and blender. Consider having supervision and share cooking responsibilities until no longer able to participate in those. Accidents with firearms and other hazards in the house should be identified and addressed as well.  DRIVING: Regarding driving, in patients with progressive memory problems, driving will be impaired. We advise to have someone else do the driving if trouble finding directions or if minor accidents are reported. Independent driving assessment is available to determine safety of driving.  ABILITY TO BE LEFT ALONE: If patient is unable to contact 911 operator, consider using LifeLine, or when the need is there, arrange for someone to stay with patients. Smoking is a fire hazard, consider supervision or cessation. Risk of wandering should be assessed by caregiver and if  detected at any point, supervision and safe proof recommendations should be instituted.  MEDICATION SUPERVISION: Inability to self-administer medication needs to be constantly addressed. Implement a mechanism to ensure safe administration of the medications.  RECOMMENDATIONS FOR ALL PATIENTS WITH MEMORY PROBLEMS: 1. Continue to exercise (Recommend 30 minutes of walking everyday, or 3 hours every week) 2. Increase social interactions - continue going to [place] and enjoy social gatherings with friends and family 3. Eat healthy, avoid fried foods and eat more fruits and vegetables 4. Maintain adequate blood pressure, blood sugar, and blood cholesterol level. Reducing the risk of stroke and cardiovascular disease also helps promoting better memory. 5. Avoid stressful situations. Live a simple life and avoid aggravations. Organize your time and prepare for the next day in anticipation. 6. Sleep well, avoid any interruptions of sleep and avoid any distractions in the bedroom that may interfere with adequate sleep quality 7. Avoid sugar, avoid sweets as there is a strong link between excessive sugar intake, diabetes, and cognitive impairment The Mediterranean diet has been shown to help patients reduce the risk of progressive memory disorders and reduces cardiovascular risk. This includes eating fish, eat fruits and green leafy vegetables, nuts like almonds and hazelnuts, walnuts, and also use olive oil. Avoid fast foods and fried foods as much as possible. Avoid sweets and sugar as sugar use has been linked to worsening of memory function.  There is always a concern of gradual progression of memory problems. If this is the case, then we may need to adjust level of care according to patient needs. Support, both to the patient and caregiver, should then be  put into place.

## 2018-11-15 ENCOUNTER — Encounter: Payer: Self-pay | Admitting: Physical Therapy

## 2018-11-15 ENCOUNTER — Ambulatory Visit: Payer: PPO | Attending: Neurology | Admitting: Physical Therapy

## 2018-11-15 ENCOUNTER — Other Ambulatory Visit: Payer: Self-pay

## 2018-11-15 DIAGNOSIS — R293 Abnormal posture: Secondary | ICD-10-CM

## 2018-11-15 DIAGNOSIS — R2689 Other abnormalities of gait and mobility: Secondary | ICD-10-CM | POA: Diagnosis not present

## 2018-11-15 DIAGNOSIS — M6281 Muscle weakness (generalized): Secondary | ICD-10-CM | POA: Diagnosis not present

## 2018-11-15 NOTE — Therapy (Signed)
[facility] [address], [place], [place], [address] Phone: [phone]   Fax:  [phone]  Physical Therapy Evaluation  Patient Details  Name: Erik Lara MRN: [id number] Date of Birth: 09/23/1935 Referring Provider (PT): Dr. Ellouise Newer   Encounter Date: 11/15/2018  PT End of Session - 11/15/18 1300    Visit Number  1    Date for PT Re-Evaluation  01/10/19    Authorization Type  [employer]    PT Start Time  1230    PT Stop Time  1300    PT Time Calculation (min)  30 min    Activity Tolerance  Patient tolerated treatment well    Behavior During Therapy  [facility] for tasks assessed/performed       Past Medical History:  Diagnosis Date  . Cysts    liver  . Diabetes mellitus ([place])   . Gallstones    asymptomatic  . Hyperlipidemia   . Obesity   . Prostatitis    requiring a trip to the ER in [place] with a fever - 09    Past Surgical History:  Procedure Laterality Date  . HERNIA REPAIR     Childhood    There were no vitals filed for this visit.   Subjective Assessment - 11/15/18 1237    Subjective  Patient walks with his head forward.    Patient Stated Goals  work on gait    Currently in Pain?  No/denies         [facility] PT Assessment - 11/15/18 0001      Assessment   Medical Diagnosis  R26.81 Gait instability    Referring Provider (PT)  Dr. Ellouise Newer    Onset Date/Surgical Date  --   chronic   Prior Therapy  none      Precautions   Precautions  None      Restrictions   Weight Bearing Restrictions  No      Balance Screen   Has the patient fallen in the past 6 months  Yes    How many times?  1   tripped over his feet   Has the patient had a decrease in activity level because of a fear of falling?   No    Is the patient reluctant to leave their home because of a fear of falling?   No      Home Film/video editor residence    Living Arrangements   Spouse/significant other      Prior Function   Level of [place]  Retired      Associate Professor   Overall Cognitive Status  Within Functional Limits for tasks assessed      Observation/Other Assessments   Observations  stand against the wall and measure from the wall to crown of head is 29 cm      Posture/Postural Control   Posture/Postural Control  Postural limitations    Postural Limitations  Forward head;Rounded Shoulders;Increased thoracic kyphosis;Flexed trunk      ROM / Strength   AROM / PROM / Strength  AROM;PROM;Strength      Strength   Right Hip Extension  3+/5    Right Hip ABduction  3+/5    Right Hip ADduction  4-/5    Left Hip Extension  3+/5    Left Hip ABduction  3+/5    Left Hip ADduction  4/5      Ambulation/Gait  Assistive device  None    Gait Pattern  Decreased step length - left;Decreased step length - right;Decreased dorsiflexion - right;Decreased dorsiflexion - left;Trunk flexed;Wide base of support   head down     Standardized Balance Assessment   Standardized Balance Assessment  Five Times Sit to Stand    Five times sit to stand comments   14 sec      Dynamic Gait Index   Level Surface  Mild Impairment    Change in Gait Speed  Mild Impairment    Gait with Horizontal Head Turns  Mild Impairment    Gait with Vertical Head Turns  Mild Impairment    Gait and Pivot Turn  Mild Impairment    Step Over Obstacle  Moderate Impairment    Step Around Obstacles  Moderate Impairment    Steps  Mild Impairment    Total Score  14      Timed Up and Go Test   Normal TUG (seconds)  13    TUG Comments  kept head down                Objective measurements completed on examination: See above findings.                PT Short Term Goals - 11/15/18 1307      PT SHORT TERM GOAL #1   Title  independent with initial HEP    Time  4    Period  Weeks    Status  New    Target Date  12/13/18      PT SHORT TERM GOAL #2    Title  stand with his head </= 23 cm due to increased trunk strength and improved posture    Time  4    Period  Weeks    Status  New    Target Date  12/13/18      PT SHORT TERM GOAL #4   Title  Dynamic Gait index >/= 18 due to improved gait and strength    Time  4    Period  Weeks    Status  New    Target Date  12/13/18        PT Long Term Goals - 11/15/18 1309      PT LONG TERM GOAL #1   Title  Pt will be ind with advanced HEP    Time  8    Period  Weeks    Status  New    Target Date  01/10/19      PT LONG TERM GOAL #2   Title  stand with his head from the wall </= 20 cm due to increased trunk strength and posture    Baseline  --    Time  8    Period  Weeks    Status  New    Target Date  01/10/19      PT LONG TERM GOAL #3   Title  able to walk for 2 minutes without verbal cues to keep his head up due to increased strength    Baseline  --    Time  8    Period  Weeks    Status  New    Target Date  01/10/19      PT LONG TERM GOAL #4   Title  Dynamic Gait Index >/= 20 due to improve balance    Baseline  ---    Time  8    Period  Weeks  Status  New    Target Date  01/10/19      PT LONG TERM GOAL #5   Title  --    Baseline  --             Plan - 11/15/18 1301    Clinical Impression Statement  Patient is a 83 year old male with gait deficits. Patient has fallen one time in the past 6 months due to tripping over his feet. Dynamic Gait index is 14 showing he is at risk of falling. Patient will walk slowly to step over object and walking around things Patient walks with his head down, flexed trunk, wide base of support, decreased dorsiflexion. Patient goes up and down stairs with head down and has to use the railings. Standing upright is head is 29 cm away from the wall. Bilateral hip strength is 3+-4/10. Patient has not been able to get to the gym due to covid. Patient will benefit from skilled therapy to improve his gait, balance and strength to reduce risk  of falls.    Personal Factors and Comorbidities  Age;Fitness    Examination-Activity Limitations  Locomotion Level;Stairs;Stand    Insurance claims handler;Shop    Stability/Clinical Decision Making  Stable/Uncomplicated    Clinical Decision Making  Low    Rehab Potential  Excellent    PT Frequency  2x / week    PT Duration  8 weeks    PT Treatment/Interventions  Gait training;Stair training;Functional mobility training;Therapeutic activities;Therapeutic exercise;Balance training;Neuromuscular re-education;Patient/family education;Manual techniques    PT Next Visit Plan  HEP for balance, hip strength, and upright posture    Consulted and Agree with Plan of Care  Patient       Patient will benefit from skilled therapeutic intervention in order to improve the following deficits and impairments:  Abnormal gait, Difficulty walking, Decreased endurance, Decreased balance, Decreased strength, Decreased mobility  Visit Diagnosis: Other abnormalities of gait and mobility - Plan: PT plan of care cert/re-cert  Abnormal posture - Plan: PT plan of care cert/re-cert  Muscle weakness (generalized) - Plan: PT plan of care cert/re-cert     Problem List Patient Active Problem List   Diagnosis Date Noted  . Hearing loss of right ear 01/23/2017  . Mild cognitive impairment 09/13/2016  . Type 2 diabetes mellitus without complication, without long-term current use of insulin ([place]) 03/30/2015  . Short-term memory loss 02/03/2015  . H/O hyperthyroidism 01/28/2013  . ERECTILE DYSFUNCTION, MILD 02/19/2008  . Allergic rhinitis 02/19/2008  . Hyperlipidemia 02/15/2007  . HEPATIC CYST 02/15/2007    Earlie Counts, PT 11/15/18 1:14 PM   [facility] [address] [place], [place], [address] Phone: [phone]   Fax:  [phone]  Name: GARISON HEYSER MRN: [id number] Date of Birth: [birth year]

## 2018-11-21 ENCOUNTER — Other Ambulatory Visit: Payer: Self-pay

## 2018-11-21 ENCOUNTER — Encounter: Payer: Self-pay | Admitting: Physical Therapy

## 2018-11-21 ENCOUNTER — Ambulatory Visit: Payer: PPO | Admitting: Physical Therapy

## 2018-11-21 DIAGNOSIS — R2689 Other abnormalities of gait and mobility: Secondary | ICD-10-CM

## 2018-11-21 DIAGNOSIS — R293 Abnormal posture: Secondary | ICD-10-CM

## 2018-11-21 DIAGNOSIS — M6281 Muscle weakness (generalized): Secondary | ICD-10-CM

## 2018-11-21 NOTE — Therapy (Signed)
[facility] [address] [place], [place], [address] Phone: [phone]   Fax:  [phone]  Physical Therapy Treatment  Patient Details  Name: Erik Lara MRN: [id number] Date of Birth: 1935-10-15 Referring Provider (PT): Dr. Ellouise Newer   Encounter Date: 11/21/2018  PT End of Session - 11/21/18 1458    Visit Number  2    Date for PT Re-Evaluation  01/10/19    Authorization Type  [employer]    PT Start Time  1415    PT Stop Time  1455    PT Time Calculation (min)  40 min    Activity Tolerance  Patient tolerated treatment well    Behavior During Therapy  [facility] for tasks assessed/performed       Past Medical History:  Diagnosis Date  . Cysts    liver  . Diabetes mellitus ([place])   . Gallstones    asymptomatic  . Hyperlipidemia   . Obesity   . Prostatitis    requiring a trip to the ER in [place] with a fever - 09    Past Surgical History:  Procedure Laterality Date  . HERNIA REPAIR     Childhood    There were no vitals filed for this visit.  Subjective Assessment - 11/21/18 1419    Subjective  I [place] balance in the park over the [place]. I fell toward the [place] to brake my fall. I walk with my head forward.    Patient Stated Goals  work on gait    Currently in Pain?  No/denies                       [facility] Adult PT Treatment/Exercise - 11/21/18 0001      Knee/Hip Exercises: Stretches   Active Hamstring Stretch  Right;Left;2 reps;30 seconds   sitting   Gastroc Stretch  Right;Left;2 reps;30 seconds   VD to look upward     Knee/Hip Exercises: Aerobic   Nustep  seat #10, level 3 for 6 minutes while assessing patient      Knee/Hip Exercises: Standing   Hip Abduction  Stengthening;Right;Left;1 set;15 reps;Knee straight   holding onto treadmill   Abduction Limitations  VC to keep heak upright    Hip Extension  Stengthening;Right;Left;1 set;20 reps;Knee straight     Extension Limitations  VC to stand tall    Rebounder  all 3 directions while lookng upward at a pink sticky note for postureal aawareness    Gait Training  walk with one foot ahead of other while looking at a pink piece of paper infront of him for upright posture    Other Standing Knee Exercises  face wall alternate shoulder flexion with head uward and small base of support    Other Standing Knee Exercises  sit to stand 15 times while looking up at a bright piece of paper             PT Education - 11/21/18 1457    Education Details  Access Code: [email or web address]    Person(s) Educated  Patient    Methods  Explanation;Demonstration;Verbal cues;Handout    Comprehension  Returned demonstration;Verbalized understanding       PT Short Term Goals - 11/15/18 1307      PT SHORT TERM GOAL #1   Title  independent with initial HEP    Time  4    Period  Weeks    Status  New  Target Date  12/13/18      PT SHORT TERM GOAL #2   Title  stand with his head </= 23 cm due to increased trunk strength and improved posture    Time  4    Period  Weeks    Status  New    Target Date  12/13/18      PT SHORT TERM GOAL #4   Title  Dynamic Gait index >/= 18 due to improved gait and strength    Time  4    Period  Weeks    Status  New    Target Date  12/13/18        PT Long Term Goals - 11/15/18 1309      PT LONG TERM GOAL #1   Title  Pt will be ind with advanced HEP    Time  8    Period  Weeks    Status  New    Target Date  01/10/19      PT LONG TERM GOAL #2   Title  stand with his head from the wall </= 20 cm due to increased trunk strength and posture    Baseline  --    Time  8    Period  Weeks    Status  New    Target Date  01/10/19      PT LONG TERM GOAL #3   Title  able to walk for 2 minutes without verbal cues to keep his head up due to increased strength    Baseline  --    Time  8    Period  Weeks    Status  New    Target Date  01/10/19      PT LONG TERM GOAL #4    Title  Dynamic Gait Index >/= 20 due to improve balance    Baseline  ---    Time  8    Period  Weeks    Status  New    Target Date  01/10/19      PT LONG TERM GOAL #5   Title  --    Baseline  --            Plan - 11/21/18 1458    Clinical Impression Statement  Patient fell at the park against a tree due to poor balance. Patient needs constant verbal cues to look upward to promote upright posture during exercise. Patient was instructed to tandem walk but unable to so he walked with narrow base of support. Patient has tight hamstrings making it difficult for him to fully extend his knees with hamstring stretch in sitting. Patient has difficulty with gastroc stretch in standing to keep his knees straight. Patient will benefit from skilled therapy to improve his gait, balance and strength to reduce the risk of falls.    Personal Factors and Comorbidities  Age;Fitness    Examination-Activity Limitations  Locomotion Level;Stairs;Stand    Stability/Clinical Decision Making  Stable/Uncomplicated    Rehab Potential  Excellent    PT Frequency  2x / week    PT Duration  8 weeks    PT Treatment/Interventions  Gait training;Stair training;Functional mobility training;Therapeutic activities;Therapeutic exercise;Balance training;Neuromuscular re-education;Patient/family education;Manual techniques    PT Next Visit Plan  HEP for balance, hip strength, and upright posture; flexibility of hamstring, gastroc and hip flexors    PT Home Exercise Plan  Access Code: [email or web address]    Consulted and Agree with Plan of Care  Patient  Patient will benefit from skilled therapeutic intervention in order to improve the following deficits and impairments:  Abnormal gait, Difficulty walking, Decreased endurance, Decreased balance, Decreased strength, Decreased mobility  Visit Diagnosis: Other abnormalities of gait and mobility  Abnormal posture  Muscle weakness (generalized)     Problem List Patient  Active Problem List   Diagnosis Date Noted  . Hearing loss of right ear 01/23/2017  . Mild cognitive impairment 09/13/2016  . Type 2 diabetes mellitus without complication, without long-term current use of insulin ([place]) 03/30/2015  . Short-term memory loss 02/03/2015  . H/O hyperthyroidism 01/28/2013  . ERECTILE DYSFUNCTION, MILD 02/19/2008  . Allergic rhinitis 02/19/2008  . Hyperlipidemia 02/15/2007  . HEPATIC CYST 02/15/2007    Earlie Counts, PT 11/21/18 3:03 PM   [facility] [address] [place], [place], [address] Phone: [phone]   Fax:  [phone]  Name: Erik Lara MRN: [id number] Date of Birth: [birth year]

## 2018-11-21 NOTE — Patient Instructions (Signed)
Access Code: [email or web address]  URL: [email or web address]  Date: 11/21/2018  Prepared by: Earlie Counts   Exercises Standing Hip Abduction - 10 reps - 2 sets - 1x daily - 7x weekly Standing Hip Extension - 10 reps - 2 sets - 1x daily - 7x weekly [facility] [address], [place], [place] [address] Phone # [phone] Fax [phone]

## 2018-11-23 ENCOUNTER — Encounter

## 2018-11-27 ENCOUNTER — Ambulatory Visit: Payer: PPO | Admitting: Physical Therapy

## 2018-11-27 ENCOUNTER — Encounter: Payer: Self-pay | Admitting: Physical Therapy

## 2018-11-27 ENCOUNTER — Other Ambulatory Visit: Payer: Self-pay

## 2018-11-27 DIAGNOSIS — R2689 Other abnormalities of gait and mobility: Secondary | ICD-10-CM

## 2018-11-27 DIAGNOSIS — M6281 Muscle weakness (generalized): Secondary | ICD-10-CM

## 2018-11-27 DIAGNOSIS — R293 Abnormal posture: Secondary | ICD-10-CM

## 2018-11-27 NOTE — Patient Instructions (Signed)
Access Code: [email or web address]  URL: [email or web address]  Date: 11/27/2018  Prepared by: Sherol Dade   Exercises  Standing Hip Abduction - 10 reps - 2 sets - 1x daily - 7x weekly  Seated Thoracic Lumbar Extension - 10 reps - 2x daily - 7x weekly  Standing Hip Extension - 20 reps - 2 sets - 1x daily - 7x weekly    [facility] [address] [place], [place] [address] Phone # [phone] Fax [phone]

## 2018-11-27 NOTE — Therapy (Signed)
[facility] [address] [place], [place], [address] Phone: [phone]   Fax:  [phone]  Physical Therapy Treatment  Patient Details  Name: Erik Lara MRN: [id number] Date of Birth: 10-18-1935 Referring Provider (PT): Dr. Ellouise Newer   Encounter Date: 11/27/2018  PT End of Session - 11/27/18 1541    Visit Number  3    Date for PT Re-Evaluation  01/10/19    Authorization Type  [employer]    PT Start Time  1500    PT Stop Time  1540    PT Time Calculation (min)  40 min    Activity Tolerance  Patient tolerated treatment well;No increased pain    Behavior During Therapy  WFL for tasks assessed/performed       Past Medical History:  Diagnosis Date  . Cysts    liver  . Diabetes mellitus ([place])   . Gallstones    asymptomatic  . Hyperlipidemia   . Obesity   . Prostatitis    requiring a trip to the ER in [place] with a fever - 09    Past Surgical History:  Procedure Laterality Date  . HERNIA REPAIR     Childhood    There were no vitals filed for this visit.  Subjective Assessment - 11/27/18 1503    Subjective  Pt states that things are going well. He thinks he is doing his exercises incorrect because they are too easy.    Patient Stated Goals  work on gait    Currently in Pain?  No/denies                       [facility] Adult PT Treatment/Exercise - 11/27/18 0001      Exercises   Exercises  Knee/Hip;Other Exercises    Other Exercises   seated thoracic extension over half foam roll x15 reps, seated cervical extension x15 reps       Knee/Hip Exercises: Stretches   Other Knee/Hip Stretches  B pec stretch seated with towel roll along spine 5x10 sec hold       Knee/Hip Exercises: Standing   Heel Raises  Both;2 sets;20 reps    Hip Abduction  Stengthening;Left;Right;2 sets;10 reps    Abduction Limitations  sidestepping with yellow TB around feet     Hip Extension   Stengthening;Right;1 set;10 reps;Knee straight    Extension Limitations  VC to maintain upright posture              PT Education - 11/27/18 1539    Education Details  updated HEP    Person(s) Educated  Patient    Methods  Explanation;Handout    Comprehension  Verbalized understanding;Returned demonstration       PT Short Term Goals - 11/15/18 1307      PT SHORT TERM GOAL #1   Title  independent with initial HEP    Time  4    Period  Weeks    Status  New    Target Date  12/13/18      PT SHORT TERM GOAL #2   Title  stand with his head </= 23 cm due to increased trunk strength and improved posture    Time  4    Period  Weeks    Status  New    Target Date  12/13/18      PT SHORT TERM GOAL #4   Title  Dynamic Gait index >/= 18 due to improved  gait and strength    Time  4    Period  Weeks    Status  New    Target Date  12/13/18        PT Long Term Goals - 11/15/18 1309      PT LONG TERM GOAL #1   Title  Pt will be ind with advanced HEP    Time  8    Period  Weeks    Status  New    Target Date  01/10/19      PT LONG TERM GOAL #2   Title  stand with his head from the wall </= 20 cm due to increased trunk strength and posture    Baseline  --    Time  8    Period  Weeks    Status  New    Target Date  01/10/19      PT LONG TERM GOAL #3   Title  able to walk for 2 minutes without verbal cues to keep his head up due to increased strength    Baseline  --    Time  8    Period  Weeks    Status  New    Target Date  01/10/19      PT LONG TERM GOAL #4   Title  Dynamic Gait Index >/= 20 due to improve balance    Baseline  ---    Time  8    Period  Weeks    Status  New    Target Date  01/10/19      PT LONG TERM GOAL #5   Title  --    Baseline  --            Plan - 11/27/18 1542    Clinical Impression Statement  Pt arrived reporting having little to no difficulty with his HEP over the weekend. He required cuing to improve his posture awareness, but  he was able to add yellow TB resistance without difficulty. Also worked on addressing pt's posture with seated repeated thoracic and cervical extension. Pt was able to stand tall after today's session and is eager to continue working on this at home.    Personal Factors and Comorbidities  Age;Fitness    Examination-Activity Limitations  Locomotion Level;Stairs;Stand    Stability/Clinical Decision Making  Stable/Uncomplicated    Rehab Potential  Excellent    PT Frequency  2x / week    PT Duration  8 weeks    PT Treatment/Interventions  Gait training;Stair training;Functional mobility training;Therapeutic activities;Therapeutic exercise;Balance training;Neuromuscular re-education;Patient/family education;Manual techniques    PT Next Visit Plan  HEP for balance, hip strength, and upright posture; flexibility of hamstring, gastroc and hip flexors    PT Home Exercise Plan  Access Code: [email or web address]    Consulted and Agree with Plan of Care  Patient       Patient will benefit from skilled therapeutic intervention in order to improve the following deficits and impairments:  Abnormal gait, Difficulty walking, Decreased endurance, Decreased balance, Decreased strength, Decreased mobility  Visit Diagnosis: Other abnormalities of gait and mobility  Abnormal posture  Muscle weakness (generalized)     Problem List Patient Active Problem List   Diagnosis Date Noted  . Hearing loss of right ear 01/23/2017  . Mild cognitive impairment 09/13/2016  . Type 2 diabetes mellitus without complication, without long-term current use of insulin ([place]) 03/30/2015  . Short-term memory loss 02/03/2015  . H/O hyperthyroidism 01/28/2013  . ERECTILE DYSFUNCTION, MILD  02/19/2008  . Allergic rhinitis 02/19/2008  . Hyperlipidemia 02/15/2007  . HEPATIC CYST 02/15/2007    3:49 PM,11/27/18 Sherol Dade PT, DPT [facility] [address] [place], [place], [address] Phone: [phone]   Fax:  [phone]  Name: Erik Lara MRN: [id number] Date of Birth: 06/07/1935

## 2018-11-29 ENCOUNTER — Encounter: Payer: Self-pay | Admitting: Physical Therapy

## 2018-11-29 ENCOUNTER — Ambulatory Visit: Payer: PPO | Admitting: Physical Therapy

## 2018-11-29 ENCOUNTER — Other Ambulatory Visit: Payer: Self-pay

## 2018-11-29 DIAGNOSIS — R293 Abnormal posture: Secondary | ICD-10-CM

## 2018-11-29 DIAGNOSIS — M6281 Muscle weakness (generalized): Secondary | ICD-10-CM

## 2018-11-29 DIAGNOSIS — R2689 Other abnormalities of gait and mobility: Secondary | ICD-10-CM

## 2018-11-29 NOTE — Therapy (Signed)
[facility] [address] [place], [place], [address] Phone: [phone]   Fax:  [phone]  Physical Therapy Treatment  Patient Details  Name: Erik Lara MRN: [id number] Date of Birth: 03/23/1935 Referring Provider (PT): Dr. Ellouise Newer   Encounter Date: 11/29/2018  PT End of Session - 11/29/18 1344    Visit Number  4    Date for PT Re-Evaluation  01/10/19    Authorization Type  [employer]    PT Start Time  1321    PT Stop Time  1404    PT Time Calculation (min)  43 min    Activity Tolerance  Patient tolerated treatment well;No increased pain    Behavior During Therapy  WFL for tasks assessed/performed       Past Medical History:  Diagnosis Date  . Cysts    liver  . Diabetes mellitus ([place])   . Gallstones    asymptomatic  . Hyperlipidemia   . Obesity   . Prostatitis    requiring a trip to the ER in [place] with a fever - 09    Past Surgical History:  Procedure Laterality Date  . HERNIA REPAIR     Childhood    There were no vitals filed for this visit.  Subjective Assessment - 11/29/18 1325    Subjective  Pt states things are going well. He tried his HEP and struggled some with the band around his feet.    Patient Stated Goals  work on gait    Currently in Pain?  No/denies                       [facility] Adult PT Treatment/Exercise - 11/29/18 0001      Exercises   Exercises  Neck      Neck Exercises: Supine   Neck Retraction  15 reps    Neck Retraction Limitations  pressing head into 2 pillows    Other Supine Exercise  B horizontal abduction with pec stretch 10x10 sec hold       Knee/Hip Exercises: Stretches   Hip Flexor Stretch  Both;30 seconds;2 reps    Hip Flexor Stretch Limitations  supine       Knee/Hip Exercises: Aerobic   Nustep  seat 9 L2 x5 min, target placed ahead of pt and he was encouraged to maintain focus in this direction      Knee/Hip  Exercises: Supine   Other Supine Knee/Hip Exercises  active 90/90 hamstring stretch 5x10 sec each side      Manual Therapy   Manual Therapy  Passive ROM    Passive ROM  Bilateral hamstring 90/90 strech, pirformis x1 rep           Balance Exercises - 11/29/18 1357      Balance Exercises: Standing   Standing Eyes Opened  Narrow base of support (BOS);Solid surface;1 rep;30 secs    Tandem Stance  Eyes open;Intermittent upper extremity support;2 reps;30 secs    Standing, One Foot on a Step  Eyes open;6 inch;2 reps;20 secs    Other Standing Exercises  alternating LE tap on 6" step x10 reps each           PT Short Term Goals - 11/29/18 1450      PT SHORT TERM GOAL #1   Title  independent with initial HEP    Time  4    Period  Weeks    Status  Partially Met  Target Date  12/13/18      PT SHORT TERM GOAL #2   Title  stand with his head </= 23 cm due to increased trunk strength and improved posture    Time  4    Period  Weeks    Status  New    Target Date  12/13/18      PT SHORT TERM GOAL #4   Title  Dynamic Gait index >/= 18 due to improved gait and strength    Time  4    Period  Weeks    Status  New    Target Date  12/13/18        PT Long Term Goals - 11/15/18 1309      PT LONG TERM GOAL #1   Title  Pt will be ind with advanced HEP    Time  8    Period  Weeks    Status  New    Target Date  01/10/19      PT LONG TERM GOAL #2   Title  stand with his head from the wall </= 20 cm due to increased trunk strength and posture    Baseline  --    Time  8    Period  Weeks    Status  New    Target Date  01/10/19      PT LONG TERM GOAL #3   Title  able to walk for 2 minutes without verbal cues to keep his head up due to increased strength    Baseline  --    Time  8    Period  Weeks    Status  New    Target Date  01/10/19      PT LONG TERM GOAL #4   Title  Dynamic Gait Index >/= 20 due to improve balance    Baseline  ---    Time  8    Period  Weeks     Status  New    Target Date  01/10/19      PT LONG TERM GOAL #5   Title  --    Baseline  --            Plan - 11/29/18 1404    Clinical Impression Statement  Pt had some issues with his HEP but only notes concerns with the setup. He was encouraged to set up in the chair facing his kitchen counter in order to prevent risk of LOB and injury. Session focused on increasing hip flexibility with both pt and therapist guided stretching. Ended with balance activity to increase static proprioception. Pt struggled with tandem stance, requiring some modifications to foot position and therapist used visual cues to improve his posture during this. No updates were made to pt's HEP due to his recent struggle with keeping up with the proper technique. Will continue with current POC.    Personal Factors and Comorbidities  Age;Fitness    Examination-Activity Limitations  Locomotion Level;Stairs;Stand    Stability/Clinical Decision Making  Stable/Uncomplicated    Rehab Potential  Excellent    PT Frequency  2x / week    PT Duration  8 weeks    PT Treatment/Interventions  Gait training;Stair training;Functional mobility training;Therapeutic activities;Therapeutic exercise;Balance training;Neuromuscular re-education;Patient/family education;Manual techniques    PT Next Visit Plan  progress hip strength, and upright posture; flexibility of hamstring, gastroc and hip flexors    PT Home Exercise Plan  Access Code: [email or web address]    Consulted and Agree with Plan  of Care  Patient       Patient will benefit from skilled therapeutic intervention in order to improve the following deficits and impairments:  Abnormal gait, Difficulty walking, Decreased endurance, Decreased balance, Decreased strength, Decreased mobility  Visit Diagnosis: Other abnormalities of gait and mobility  Abnormal posture  Muscle weakness (generalized)     Problem List Patient Active Problem List   Diagnosis Date Noted  . Hearing loss of  right ear 01/23/2017  . Mild cognitive impairment 09/13/2016  . Type 2 diabetes mellitus without complication, without long-term current use of insulin ([place]) 03/30/2015  . Short-term memory loss 02/03/2015  . H/O hyperthyroidism 01/28/2013  . ERECTILE DYSFUNCTION, MILD 02/19/2008  . Allergic rhinitis 02/19/2008  . Hyperlipidemia 02/15/2007  . HEPATIC CYST 02/15/2007    2:51 PM,11/29/18 Sherol Dade PT, DPT [facility] [address] [place], [place], [address] Phone: [phone]   Fax:  [phone]  Name: Erik Lara MRN: [id number] Date of Birth: 1936-01-15

## 2018-11-30 ENCOUNTER — Ambulatory Visit: Payer: PPO | Admitting: Neurology

## 2018-12-04 ENCOUNTER — Other Ambulatory Visit: Payer: Self-pay

## 2018-12-04 ENCOUNTER — Encounter: Payer: Self-pay | Admitting: Physical Therapy

## 2018-12-04 ENCOUNTER — Ambulatory Visit: Payer: PPO | Admitting: Physical Therapy

## 2018-12-04 DIAGNOSIS — M6281 Muscle weakness (generalized): Secondary | ICD-10-CM

## 2018-12-04 DIAGNOSIS — R2689 Other abnormalities of gait and mobility: Secondary | ICD-10-CM

## 2018-12-04 DIAGNOSIS — R293 Abnormal posture: Secondary | ICD-10-CM

## 2018-12-04 NOTE — Therapy (Signed)
[facility] [address] [place], [place], [address] Phone: [phone]   Fax:  [phone]  Physical Therapy Treatment  Patient Details  Name: Erik Lara MRN: [id number] Date of Birth: [birth year] Referring Provider (PT): Dr. Ellouise Newer   Encounter Date: 12/04/2018  PT End of Session - 12/04/18 1733    Visit Number  5    Date for PT Re-Evaluation  01/10/19    Authorization Type  [employer]    PT Start Time  1502    PT Stop Time  1545    PT Time Calculation (min)  43 min    Activity Tolerance  Patient tolerated treatment well;No increased pain    Behavior During Therapy  WFL for tasks assessed/performed       Past Medical History:  Diagnosis Date  . Cysts    liver  . Diabetes mellitus ([place])   . Gallstones    asymptomatic  . Hyperlipidemia   . Obesity   . Prostatitis    requiring a trip to the ER in [place] with a fever - 09    Past Surgical History:  Procedure Laterality Date  . HERNIA REPAIR     Childhood    There were no vitals filed for this visit.  Subjective Assessment - 12/04/18 1518    Subjective  Pt has no complaints at this time.    Patient Stated Goals  work on gait    Currently in Pain?  No/denies                       [facility] Adult PT Treatment/Exercise - 12/04/18 0001      Neck Exercises: Seated   Other Seated Exercise  thoracic extension over foam roll x15 reps; pectoral stretch with foam roll vertically along thoracic spine with therapist overpressure 10x10 sec hold       Knee/Hip Exercises: Aerobic   Nustep  seat 9 L2 x7 min, target placed ahead of pt and he was encouraged to maintain focus in this direction      Knee/Hip Exercises: Standing   Knee Flexion  Strengthening;Both;2 sets;10 reps    Knee Flexion Limitations  2#; eyes fixed on a target ahead of him     Hip Flexion  Stengthening;Both;2 sets;10 reps    Hip Flexion Limitations  2#; 1  hand support only    Hip Extension  Stengthening;Both;1 set;15 reps;Knee straight    Extension Limitations  2# ankle weights              PT Education - 12/04/18 1733    Education Details  technique with therex    Person(s) Educated  Patient    Methods  Explanation;Verbal cues;Tactile cues    Comprehension  Verbalized understanding;Returned demonstration       PT Short Term Goals - 11/29/18 1450      PT SHORT TERM GOAL #1   Title  independent with initial HEP    Time  4    Period  Weeks    Status  Partially Met    Target Date  12/13/18      PT SHORT TERM GOAL #2   Title  stand with his head </= 23 cm due to increased trunk strength and improved posture    Time  4    Period  Weeks    Status  New    Target Date  12/13/18      PT SHORT TERM GOAL #  4   Title  Dynamic Gait index >/= 18 due to improved gait and strength    Time  4    Period  Weeks    Status  New    Target Date  12/13/18        PT Long Term Goals - 11/15/18 1309      PT LONG TERM GOAL #1   Title  Pt will be ind with advanced HEP    Time  8    Period  Weeks    Status  New    Target Date  01/10/19      PT LONG TERM GOAL #2   Title  stand with his head from the wall </= 20 cm due to increased trunk strength and posture    Baseline  --    Time  8    Period  Weeks    Status  New    Target Date  01/10/19      PT LONG TERM GOAL #3   Title  able to walk for 2 minutes without verbal cues to keep his head up due to increased strength    Baseline  --    Time  8    Period  Weeks    Status  New    Target Date  01/10/19      PT LONG TERM GOAL #4   Title  Dynamic Gait Index >/= 20 due to improve balance    Baseline  ---    Time  8    Period  Weeks    Status  New    Target Date  01/10/19      PT LONG TERM GOAL #5   Title  --    Baseline  --            Plan - 12/04/18 1734    Clinical Impression Statement  Today's session focused on therex to promote LE strength and posture. Pt is  demonstrating increased awareness of his posture and is able to maintain head up when provided a target ahead of him. Pt's balance was challenged with standing therex by decreasing UE support. He had difficulty with hip extension secondary to ROM restrictions. Will continue with current POC to promote posture, strength and proprioception moving forward.    Personal Factors and Comorbidities  Age;Fitness    Examination-Activity Limitations  Locomotion Level;Stairs;Stand    Stability/Clinical Decision Making  Stable/Uncomplicated    Rehab Potential  Excellent    PT Frequency  2x / week    PT Duration  8 weeks    PT Treatment/Interventions  Gait training;Stair training;Functional mobility training;Therapeutic activities;Therapeutic exercise;Balance training;Neuromuscular re-education;Patient/family education;Manual techniques    PT Next Visit Plan  progress towards goals; progress hip strength, and upright posture; flexibility of hamstring, gastroc and hip flexors    PT Home Exercise Plan  Access Code: [email or web address]    Consulted and Agree with Plan of Care  Patient       Patient will benefit from skilled therapeutic intervention in order to improve the following deficits and impairments:  Abnormal gait, Difficulty walking, Decreased endurance, Decreased balance, Decreased strength, Decreased mobility  Visit Diagnosis: Other abnormalities of gait and mobility  Abnormal posture  Muscle weakness (generalized)     Problem List Patient Active Problem List   Diagnosis Date Noted  . Hearing loss of right ear 01/23/2017  . Mild cognitive impairment 09/13/2016  . Type 2 diabetes mellitus without complication, without long-term current use of insulin ([place]) 03/30/2015  .  Short-term memory loss 02/03/2015  . H/O hyperthyroidism 01/28/2013  . ERECTILE DYSFUNCTION, MILD 02/19/2008  . Allergic rhinitis 02/19/2008  . Hyperlipidemia 02/15/2007  . HEPATIC CYST 02/15/2007    5:37 PM,12/04/18 Sherol Dade PT, DPT [facility] [address] [place], [place], [address] Phone: [phone]   Fax:  [phone]  Name: Erik Lara MRN: [id number] Date of Birth: Feb 24, 1936

## 2018-12-06 ENCOUNTER — Encounter: Payer: Self-pay | Admitting: Physical Therapy

## 2018-12-06 ENCOUNTER — Ambulatory Visit: Payer: PPO | Admitting: Physical Therapy

## 2018-12-06 ENCOUNTER — Other Ambulatory Visit: Payer: Self-pay

## 2018-12-06 DIAGNOSIS — R2689 Other abnormalities of gait and mobility: Secondary | ICD-10-CM | POA: Diagnosis not present

## 2018-12-06 DIAGNOSIS — M6281 Muscle weakness (generalized): Secondary | ICD-10-CM

## 2018-12-06 DIAGNOSIS — R293 Abnormal posture: Secondary | ICD-10-CM

## 2018-12-06 NOTE — Therapy (Signed)
[facility] [address], [place], [place], [address] Phone: [phone]   Fax:  [phone]  Physical Therapy Treatment  Patient Details  Name: Erik Lara MRN: [id number] Date of Birth: [birth year] Referring Provider (PT): Dr. Ellouise Newer   Encounter Date: 12/06/2018  PT End of Session - 12/06/18 1642    Visit Number  6    Date for PT Re-Evaluation  01/10/19    Authorization Type  [employer]    PT Start Time  1548    PT Stop Time  1637    PT Time Calculation (min)  49 min    Activity Tolerance  Patient tolerated treatment well;No increased pain    Behavior During Therapy  WFL for tasks assessed/performed       Past Medical History:  Diagnosis Date  . Cysts    liver  . Diabetes mellitus ([place])   . Gallstones    asymptomatic  . Hyperlipidemia   . Obesity   . Prostatitis    requiring a trip to the ER in [place] with a fever - 09    Past Surgical History:  Procedure Laterality Date  . HERNIA REPAIR     Childhood    There were no vitals filed for this visit.  Subjective Assessment - 12/06/18 1603    Subjective  Pt states that he was not able to complete his HEP yesterday because he has been busy. No complaints currently.    Patient Stated Goals  work on gait    Currently in Pain?  No/denies                       [facility] Adult PT Treatment/Exercise - 12/06/18 0001      Neck Exercises: Standing   Other Standing Exercises  RUE slide up wall with therapist providing scapular assistance x10 reps, pt encouraged to stand tall-reached top of door frame by end of set       Neck Exercises: Seated   Neck Retraction  15 reps;3 secs    Neck Retraction Limitations  press into ball     Other Seated Exercise  thoracic extension over foam roll x15 reps     Other Seated Exercise  BUE horizontal abduction with red TB 2x10 reps       Knee/Hip Exercises: Aerobic   Nustep  seat 9 L2  x7 min, target placed ahead of pt and he was encouraged to maintain focus in this direction          Balance Exercises - 12/06/18 1611      Balance Exercises: Standing   Tandem Stance  Eyes open   partial tandem with chest expansion x10 reps   Standing, One Foot on a Step  Eyes open;6 inch;20 secs;5 reps;10 secs        PT Education - 12/06/18 1642    Education Details  technique with therex; updated HEP    Person(s) Educated  Patient    Methods  Explanation;Handout    Comprehension  Verbalized understanding       PT Short Term Goals - 12/06/18 1629      PT SHORT TERM GOAL #1   Title  independent with initial HEP    Time  4    Period  Weeks    Status  Partially Met    Target Date  12/13/18      PT SHORT TERM GOAL #2   Title  stand with his  head </= 23 cm due to increased trunk strength and improved posture    Baseline  20 cm    Time  4    Period  Weeks    Status  Achieved    Target Date  12/13/18      PT SHORT TERM GOAL #4   Title  Dynamic Gait index >/= 18 due to improved gait and strength    Time  4    Period  Weeks    Status  New    Target Date  12/13/18        PT Long Term Goals - 11/15/18 1309      PT LONG TERM GOAL #1   Title  Pt will be ind with advanced HEP    Time  8    Period  Weeks    Status  New    Target Date  01/10/19      PT LONG TERM GOAL #2   Title  stand with his head from the wall </= 20 cm due to increased trunk strength and posture    Baseline  --    Time  8    Period  Weeks    Status  New    Target Date  01/10/19      PT LONG TERM GOAL #3   Title  able to walk for 2 minutes without verbal cues to keep his head up due to increased strength    Baseline  --    Time  8    Period  Weeks    Status  New    Target Date  01/10/19      PT LONG TERM GOAL #4   Title  Dynamic Gait Index >/= 20 due to improve balance    Baseline  ---    Time  8    Period  Weeks    Status  New    Target Date  01/10/19      PT LONG TERM GOAL #5    Title  --    Baseline  --            Plan - 12/06/18 1643    Clinical Impression Statement  Pt is making progress towards his short term goals. He demonstrates improved posture this session, able to reach the back of his head 20cm from the wall which met one of his short term goals. He does require regular cuing to correct his posture during his sessions, but his ability to correct is improving. Session also focused on standing static balance. Pt's hip flexibility limitations impair his ability to maintain single leg stance with the contralateral LE on a step, however he was able to do this for up to 10 sec at a time with CGA. HEP was updated and he demonstrate understanding of next techniques.    Personal Factors and Comorbidities  Age;Fitness    Examination-Activity Limitations  Locomotion Level;Stairs;Stand    Stability/Clinical Decision Making  Stable/Uncomplicated    Rehab Potential  Excellent    PT Frequency  2x / week    PT Duration  8 weeks    PT Treatment/Interventions  Gait training;Stair training;Functional mobility training;Therapeutic activities;Therapeutic exercise;Balance training;Neuromuscular re-education;Patient/family education;Manual techniques    PT Next Visit Plan  balance activity; progress hip strength, and upright posture; flexibility of hamstring, gastroc and hip flexors    PT Home Exercise Plan  Access Code: [email or web address]    Consulted and Agree with Plan of Care  Patient  Patient will benefit from skilled therapeutic intervention in order to improve the following deficits and impairments:  Abnormal gait, Difficulty walking, Decreased endurance, Decreased balance, Decreased strength, Decreased mobility  Visit Diagnosis: Other abnormalities of gait and mobility  Abnormal posture  Muscle weakness (generalized)     Problem List Patient Active Problem List   Diagnosis Date Noted  . Hearing loss of right ear 01/23/2017  . Mild cognitive impairment  09/13/2016  . Type 2 diabetes mellitus without complication, without long-term current use of insulin ([place]) 03/30/2015  . Short-term memory loss 02/03/2015  . H/O hyperthyroidism 01/28/2013  . ERECTILE DYSFUNCTION, MILD 02/19/2008  . Allergic rhinitis 02/19/2008  . Hyperlipidemia 02/15/2007  . HEPATIC CYST 02/15/2007   4:51 PM,12/06/18 Sherol Dade PT, DPT [place] [facility] [address] [place], [place], [address] Phone: [phone]   Fax:  [phone]  Name: Erik Lara MRN: [id number] Date of Birth: [birth year]

## 2018-12-06 NOTE — Patient Instructions (Signed)
Access Code: [email or web address]  URL: [email or web address]  Date: 12/06/2018  Prepared by: Sherol Dade   Exercises  Standing Hip Abduction - 10 reps - 2 sets - 1x daily - 7x weekly  Seated Thoracic Lumbar Extension - 10 reps - 2x daily - 7x weekly  Standing Hip Extension - 20 reps - 2 sets - 1x daily - 7x weekly  Seated Shoulder Horizontal Abduction with Resistance - 10 reps - 3 sets - 1x daily - 7x weekly    [facility] [address], [place], [place] [address] Phone # [phone] Fax [phone]

## 2018-12-11 ENCOUNTER — Other Ambulatory Visit: Payer: Self-pay

## 2018-12-11 ENCOUNTER — Encounter: Payer: Self-pay | Admitting: Physical Therapy

## 2018-12-11 ENCOUNTER — Ambulatory Visit: Payer: PPO | Admitting: Physical Therapy

## 2018-12-11 DIAGNOSIS — R293 Abnormal posture: Secondary | ICD-10-CM

## 2018-12-11 DIAGNOSIS — R2689 Other abnormalities of gait and mobility: Secondary | ICD-10-CM | POA: Diagnosis not present

## 2018-12-11 DIAGNOSIS — M6281 Muscle weakness (generalized): Secondary | ICD-10-CM

## 2018-12-11 NOTE — Therapy (Signed)
[facility] [address], [place], [place], [address] Phone: [phone]   Fax:  [phone]  Physical Therapy Treatment  Patient Details  Name: Erik Lara MRN: [id number] Date of Birth: November 05, 1935 Referring Provider (PT): Dr. Ellouise Newer   Encounter Date: 12/11/2018  PT End of Session - 12/11/18 1547    Visit Number  7    Date for PT Re-Evaluation  01/10/19    Authorization Type  [employer]    PT Start Time  1502    PT Stop Time  3491    PT Time Calculation (min)  42 min    Activity Tolerance  Patient tolerated treatment well;No increased pain    Behavior During Therapy  WFL for tasks assessed/performed       Past Medical History:  Diagnosis Date  . Cysts    liver  . Diabetes mellitus ([place])   . Gallstones    asymptomatic  . Hyperlipidemia   . Obesity   . Prostatitis    requiring a trip to the ER in [place] with a fever - 09    Past Surgical History:  Procedure Laterality Date  . HERNIA REPAIR     Childhood    There were no vitals filed for this visit.  Subjective Assessment - 12/11/18 1514    Subjective  Pt has no complaints at this time. He did some of his HEP this morning.    Patient Stated Goals  work on gait    Currently in Pain?  No/denies                       [facility] Adult PT Treatment/Exercise - 12/11/18 0001      Neck Exercises: Seated   Neck Retraction  15 reps;3 secs    Neck Retraction Limitations  press into ball     Other Seated Exercise  trunk rotation stretch x10 reps Lt and Rt     Other Seated Exercise  BUE rows with green TB 2x15 reps       Knee/Hip Exercises: Stretches   Passive Hamstring Stretch  1 rep;Both;30 seconds    Passive Hamstring Stretch Limitations  standing LE on step       Knee/Hip Exercises: Aerobic   Nustep  Seat 9, L3 x7 min PT present to discuss current HEP      Knee/Hip Exercises: Standing   Heel Raises  Both;1  set;20 reps             PT Education - 12/11/18 1548    Education Details  technique with therex    Person(s) Educated  Patient    Methods  Explanation;Verbal cues    Comprehension  Verbalized understanding;Returned demonstration       PT Short Term Goals - 12/06/18 1629      PT SHORT TERM GOAL #1   Title  independent with initial HEP    Time  4    Period  Weeks    Status  Partially Met    Target Date  12/13/18      PT SHORT TERM GOAL #2   Title  stand with his head </= 23 cm due to increased trunk strength and improved posture    Baseline  20 cm    Time  4    Period  Weeks    Status  Achieved    Target Date  12/13/18      PT SHORT TERM GOAL #4  Title  Dynamic Gait index >/= 18 due to improved gait and strength    Time  4    Period  Weeks    Status  New    Target Date  12/13/18        PT Long Term Goals - 11/15/18 1309      PT LONG TERM GOAL #1   Title  Pt will be ind with advanced HEP    Time  8    Period  Weeks    Status  New    Target Date  01/10/19      PT LONG TERM GOAL #2   Title  stand with his head from the wall </= 20 cm due to increased trunk strength and posture    Baseline  --    Time  8    Period  Weeks    Status  New    Target Date  01/10/19      PT LONG TERM GOAL #3   Title  able to walk for 2 minutes without verbal cues to keep his head up due to increased strength    Baseline  --    Time  8    Period  Weeks    Status  New    Target Date  01/10/19      PT LONG TERM GOAL #4   Title  Dynamic Gait Index >/= 20 due to improve balance    Baseline  ---    Time  8    Period  Weeks    Status  New    Target Date  01/10/19      PT LONG TERM GOAL #5   Title  --    Baseline  --            Plan - 12/11/18 1546    Clinical Impression Statement  Pt is now doing well with his HEP, reporting no difficulties with this over the past week. Today's session continued with focus on improving posture, LE strength and proprioception.  Pt was able to maintain partial tandem stance with each LE forward for up to 20 sec, although he did require close supervision with this. No pain was reported during or following today's session.    Personal Factors and Comorbidities  Age;Fitness    Examination-Activity Limitations  Locomotion Level;Stairs;Stand    Stability/Clinical Decision Making  Stable/Uncomplicated    Rehab Potential  Excellent    PT Frequency  2x / week    PT Duration  8 weeks    PT Treatment/Interventions  Gait training;Stair training;Functional mobility training;Therapeutic activities;Therapeutic exercise;Balance training;Neuromuscular re-education;Patient/family education;Manual techniques    PT Next Visit Plan  balance activity; progress hip strength, and upright posture; flexibility of hamstring, gastroc and hip flexors    PT Home Exercise Plan  Access Code: [email or web address]    Consulted and Agree with Plan of Care  Patient       Patient will benefit from skilled therapeutic intervention in order to improve the following deficits and impairments:  Abnormal gait, Difficulty walking, Decreased endurance, Decreased balance, Decreased strength, Decreased mobility  Visit Diagnosis: Other abnormalities of gait and mobility  Abnormal posture  Muscle weakness (generalized)     Problem List Patient Active Problem List   Diagnosis Date Noted  . Hearing loss of right ear 01/23/2017  . Mild cognitive impairment 09/13/2016  . Type 2 diabetes mellitus without complication, without long-term current use of insulin ([place]) 03/30/2015  . Short-term memory loss 02/03/2015  .  H/O hyperthyroidism 01/28/2013  . ERECTILE DYSFUNCTION, MILD 02/19/2008  . Allergic rhinitis 02/19/2008  . Hyperlipidemia 02/15/2007  . HEPATIC CYST 02/15/2007    3:50 PM,12/11/18 Sherol Dade PT, DPT [place] [facility] [address],  [place], [place], [address] Phone: [phone]   Fax:  [phone]  Name: [name] MRN: [id number] Date of Birth: [birth year]

## 2018-12-13 ENCOUNTER — Encounter: Payer: Self-pay | Admitting: Physical Therapy

## 2018-12-13 ENCOUNTER — Ambulatory Visit: Payer: PPO | Attending: Neurology | Admitting: Physical Therapy

## 2018-12-13 ENCOUNTER — Other Ambulatory Visit: Payer: Self-pay

## 2018-12-13 DIAGNOSIS — M6281 Muscle weakness (generalized): Secondary | ICD-10-CM

## 2018-12-13 DIAGNOSIS — M25662 Stiffness of left knee, not elsewhere classified: Secondary | ICD-10-CM | POA: Diagnosis not present

## 2018-12-13 DIAGNOSIS — M25562 Pain in left knee: Secondary | ICD-10-CM | POA: Diagnosis not present

## 2018-12-13 DIAGNOSIS — R293 Abnormal posture: Secondary | ICD-10-CM

## 2018-12-13 DIAGNOSIS — R2689 Other abnormalities of gait and mobility: Secondary | ICD-10-CM

## 2018-12-13 NOTE — Therapy (Signed)
[facility] [address], [place], [place], [address] Phone: [phone]   Fax:  [phone]  Physical Therapy Treatment  Patient Details  Name: Erik Lara MRN: [id number] Date of Birth: [birth year] Referring Provider (PT): Dr. Ellouise Newer   Encounter Date: 12/13/2018  PT End of Session - 12/13/18 1533    Visit Number  8    Date for PT Re-Evaluation  01/10/19    Authorization Type  [employer]    PT Start Time  1530    PT Stop Time  1610    PT Time Calculation (min)  40 min    Activity Tolerance  Patient tolerated treatment well;No increased pain    Behavior During Therapy  WFL for tasks assessed/performed       Past Medical History:  Diagnosis Date  . Cysts    liver  . Diabetes mellitus ([place])   . Gallstones    asymptomatic  . Hyperlipidemia   . Obesity   . Prostatitis    requiring a trip to the ER in [place] with a fever - 09    Past Surgical History:  Procedure Laterality Date  . HERNIA REPAIR     Childhood    There were no vitals filed for this visit.  Subjective Assessment - 12/13/18 1539    Subjective  My shoulder does not hurt as much. Patient has not fallen in the past 2 weeks.    Patient Stated Goals  work on gait    Currently in Pain?  No/denies         [facility] PT Assessment - 12/13/18 0001      Dynamic Gait Index   Level Surface  Mild Impairment    Change in Gait Speed  Mild Impairment    Gait with Horizontal Head Turns  Mild Impairment    Gait with Vertical Head Turns  Mild Impairment    Gait and Pivot Turn  Mild Impairment    Step Over Obstacle  Mild Impairment    Step Around Obstacles  Moderate Impairment    Steps  Mild Impairment    Total Score  15                   [facility] Adult PT Treatment/Exercise - 12/13/18 0001      Neck Exercises: Standing   Other Standing Exercises  bil. shoulder extension with red band 20 times looking up at bright  sticky note      Neck Exercises: Seated   Neck Retraction  15 reps;3 secs    Neck Retraction Limitations  press into ball     Shoulder Rolls Limitations  scapular squeezes hold for 5 sec 10x    Other Seated Exercise  trunk rotation stretch x10 reps Lt and Rt       Knee/Hip Exercises: Aerobic   Nustep  Seat 9, L3 x7 min PT present to discuss current HEP      Knee/Hip Exercises: Standing   Heel Raises  Both;1 set;20 reps    Heel Raises Limitations  looking upward at bright sticky note to keep head up    Other Standing Knee Exercises  side step on the floor holding the step railings withlooking up at bright sticky note    Other Standing Knee Exercises  walking up and down stairs with step to step and step over step keeping head up and using railings  PT Short Term Goals - 12/13/18 1614      PT SHORT TERM GOAL #4   Title  Dynamic Gait index >/= 18 due to improved gait and strength    Baseline  15    Time  4    Period  Weeks    Status  On-going        PT Long Term Goals - 11/15/18 1309      PT LONG TERM GOAL #1   Title  Pt will be ind with advanced HEP    Time  8    Period  Weeks    Status  New    Target Date  01/10/19      PT LONG TERM GOAL #2   Title  stand with his head from the wall </= 20 cm due to increased trunk strength and posture    Baseline  --    Time  8    Period  Weeks    Status  New    Target Date  01/10/19      PT LONG TERM GOAL #3   Title  able to walk for 2 minutes without verbal cues to keep his head up due to increased strength    Baseline  --    Time  8    Period  Weeks    Status  New    Target Date  01/10/19      PT LONG TERM GOAL #4   Title  Dynamic Gait Index >/= 20 due to improve balance    Baseline  ---    Time  8    Period  Weeks    Status  New    Target Date  01/10/19      PT LONG TERM GOAL #5   Title  --    Baseline  --            Plan - 12/13/18 1533    Clinical Impression Statement  Patient has  improved his dynamic test to 15 instead of 14. Patient has not fallen in the past 2 weeks. Patient continues to need verbal cues to keep his head up with standing and walking activities. Patient will  lift his feet minimally when walking around a weight on the floor. Patient will benefit from skilled therapy to improve his gait and balance to reduce his risk of falls.    Personal Factors and Comorbidities  Age;Fitness    Examination-Activity Limitations  Locomotion Level;Stairs;Stand    Insurance claims handler;Shop    Stability/Clinical Decision Making  Stable/Uncomplicated    Rehab Potential  Excellent    PT Frequency  2x / week    PT Treatment/Interventions  Gait training;Stair training;Functional mobility training;Therapeutic activities;Therapeutic exercise;Balance training;Neuromuscular re-education;Patient/family education;Manual techniques    PT Next Visit Plan  balance activity; progress hip strength, and upright posture; flexibility of hamstring, gastroc and hip flexors; 2 visits is ERO    PT Home Exercise Plan  Access Code: [email or web address]    Recommended Other Services  MD signed intial note    Consulted and Agree with Plan of Care  Patient       Patient will benefit from skilled therapeutic intervention in order to improve the following deficits and impairments:  Abnormal gait, Difficulty walking, Decreased endurance, Decreased balance, Decreased strength, Decreased mobility  Visit Diagnosis: Other abnormalities of gait and mobility  Abnormal posture  Muscle weakness (generalized)     Problem List Patient Active Problem List   Diagnosis Date  Noted  . Hearing loss of right ear 01/23/2017  . Mild cognitive impairment 09/13/2016  . Type 2 diabetes mellitus without complication, without long-term current use of insulin ([place]) 03/30/2015  . Short-term memory loss 02/03/2015  . H/O hyperthyroidism 01/28/2013  . ERECTILE DYSFUNCTION, MILD 02/19/2008   . Allergic rhinitis 02/19/2008  . Hyperlipidemia 02/15/2007  . HEPATIC CYST 02/15/2007    Earlie Counts, PT 12/13/18 4:15 PM   [facility] [address], [place], [place], [address] Phone: [phone]   Fax:  [phone]  Name: Erik Lara MRN: [id number] Date of Birth: 1935/04/18

## 2018-12-18 ENCOUNTER — Ambulatory Visit: Payer: PPO | Admitting: Physical Therapy

## 2018-12-18 ENCOUNTER — Encounter: Payer: Self-pay | Admitting: Physical Therapy

## 2018-12-18 ENCOUNTER — Other Ambulatory Visit: Payer: Self-pay

## 2018-12-18 DIAGNOSIS — M25662 Stiffness of left knee, not elsewhere classified: Secondary | ICD-10-CM

## 2018-12-18 DIAGNOSIS — R2689 Other abnormalities of gait and mobility: Secondary | ICD-10-CM

## 2018-12-18 DIAGNOSIS — R293 Abnormal posture: Secondary | ICD-10-CM

## 2018-12-18 DIAGNOSIS — M25562 Pain in left knee: Secondary | ICD-10-CM

## 2018-12-18 DIAGNOSIS — M6281 Muscle weakness (generalized): Secondary | ICD-10-CM

## 2018-12-18 NOTE — Therapy (Signed)
[facility] [address] [place], [place], [address] Phone: [phone]   Fax:  [phone]  Physical Therapy Treatment  Patient Details  Name: CORDERO DIROSA MRN: [id number] Date of Birth: November 08, 1935 Referring Provider (PT): Dr. Ellouise Newer   Encounter Date: 12/18/2018  PT End of Session - 12/18/18 1614    Visit Number  9    Date for PT Re-Evaluation  01/10/19    Authorization Type  [employer]    PT Start Time  1530    PT Stop Time  1610    PT Time Calculation (min)  40 min    Activity Tolerance  Patient tolerated treatment well;No increased pain    Behavior During Therapy  WFL for tasks assessed/performed       Past Medical History:  Diagnosis Date  . Cysts    liver  . Diabetes mellitus ([place])   . Gallstones    asymptomatic  . Hyperlipidemia   . Obesity   . Prostatitis    requiring a trip to the ER in [place] with a fever - 09    Past Surgical History:  Procedure Laterality Date  . HERNIA REPAIR     Childhood    There were no vitals filed for this visit.  Subjective Assessment - 12/18/18 1539    Subjective  I feel my balance is a little better. I have not had  any near miss falls since last visit.    Patient Stated Goals  work on gait    Currently in Pain?  No/denies                       [facility] Adult PT Treatment/Exercise - 12/18/18 0001      Neuro Re-ed    Neuro Re-ed Details   tandem stance with one foot ahead of the other, holding on the railings to get in position then not hold on      Neck Exercises: Standing   Other Standing Exercises  stand on foam keeping balance, stand and close eyes, stand and move arms up and down in a corner      Knee/Hip Exercises: Stretches   Active Hamstring Stretch  Right;Left;2 reps;30 seconds    Active Hamstring Stretch Limitations  sitting; therapist pushing knee down to get fulls tretch      Knee/Hip Exercises: Aerobic    Nustep  Seat 9, L3 x7 min PT present to discuss current HEP      Knee/Hip Exercises: Standing   Heel Raises  Both;1 set;20 reps    Heel Raises Limitations  looking upward at bright sticky note to keep head up    Forward Step Up  Right;Left;20 reps    Forward Step Up Limitations  alternatine feet on the step    Other Standing Knee Exercises  side step over items, walk straght over items with vc to look upward    Other Standing Knee Exercises  walking up and down stairs with step over step and step over step keeping head up using railings               PT Short Term Goals - 12/13/18 1614      PT SHORT TERM GOAL #4   Title  Dynamic Gait index >/= 18 due to improved gait and strength    Baseline  15    Time  4    Period  Weeks    Status  On-going  PT Long Term Goals - 12/18/18 1619      PT LONG TERM GOAL #1   Title  Pt will be ind with advanced HEP    Time  8    Period  Weeks    Status  On-going            Plan - 12/18/18 1614    Clinical Impression Statement  Patient hs not had any near miss with falls for week. Patient is able to stand with one foot ahead of other for 15 seconds without holding onto the railing. Patient has difficulty with getting on and off the foam and had to hold onto the wall. Patient continues to need verbal cues to keep his head upright. Patient still needs verbal cues to lift his feet up. Today patient was able to go up and down steps with alternating pattern while holding onto railings. Patient will benefit from skilled therapy to improve his gait and balance to reduce his risk of falls.    Personal Factors and Comorbidities  Age;Fitness    Examination-Activity Limitations  Locomotion Level;Stairs;Stand    Insurance claims handler;Shop    Stability/Clinical Decision Making  Stable/Uncomplicated    Rehab Potential  Excellent    PT Frequency  2x / week    PT Duration  8 weeks    PT  Treatment/Interventions  Gait training;Stair training;Functional mobility training;Therapeutic activities;Therapeutic exercise;Balance training;Neuromuscular re-education;Patient/family education;Manual techniques    PT Next Visit Plan  next visit is 10th visit, work on balance    PT Home Exercise Plan  Access Code: [email or web address]    Consulted and Agree with Plan of Care  Patient       Patient will benefit from skilled therapeutic intervention in order to improve the following deficits and impairments:  Abnormal gait, Difficulty walking, Decreased endurance, Decreased balance, Decreased strength, Decreased mobility  Visit Diagnosis: Other abnormalities of gait and mobility  Abnormal posture  Muscle weakness (generalized)  Acute pain of left knee  Stiffness of left knee, not elsewhere classified     Problem List Patient Active Problem List   Diagnosis Date Noted  . Hearing loss of right ear 01/23/2017  . Mild cognitive impairment 09/13/2016  . Type 2 diabetes mellitus without complication, without long-term current use of insulin ([place]) 03/30/2015  . Short-term memory loss 02/03/2015  . H/O hyperthyroidism 01/28/2013  . ERECTILE DYSFUNCTION, MILD 02/19/2008  . Allergic rhinitis 02/19/2008  . Hyperlipidemia 02/15/2007  . HEPATIC CYST 02/15/2007    Earlie Counts, PT 12/18/18 4:20 PM   [facility] [address] [place], [place], [address] Phone: [phone]   Fax:  [phone]  Name: RIDLEY PELOSI MRN: [id number] Date of Birth: 13-Aug-1935

## 2018-12-19 ENCOUNTER — Other Ambulatory Visit: Payer: Self-pay | Admitting: Adult Health

## 2018-12-19 MED ORDER — FREESTYLE LANCETS MISC: 3 refills | Status: DC

## 2018-12-19 NOTE — Telephone Encounter (Signed)
Pt needs a refill on lancet freestyle. [place]

## 2018-12-20 ENCOUNTER — Ambulatory Visit: Payer: PPO | Admitting: Physical Therapy

## 2018-12-20 ENCOUNTER — Other Ambulatory Visit: Payer: Self-pay | Admitting: Family Medicine

## 2018-12-20 ENCOUNTER — Encounter: Payer: Self-pay | Admitting: Physical Therapy

## 2018-12-20 ENCOUNTER — Other Ambulatory Visit: Payer: Self-pay

## 2018-12-20 DIAGNOSIS — R2689 Other abnormalities of gait and mobility: Secondary | ICD-10-CM

## 2018-12-20 DIAGNOSIS — R293 Abnormal posture: Secondary | ICD-10-CM

## 2018-12-20 DIAGNOSIS — M6281 Muscle weakness (generalized): Secondary | ICD-10-CM

## 2018-12-20 MED ORDER — FREESTYLE LITE TEST VI STRP: ORAL_STRIP | 3 refills | Status: DC

## 2018-12-20 NOTE — Therapy (Signed)
[facility] [address], [place], [place], [address] Phone: [phone]   Fax:  [phone]  Physical Therapy Treatment  Patient Details  Name: Erik Lara MRN: [id number] Date of Birth: 11-16-1935 Referring Provider (PT): Dr. Ellouise Newer   Encounter Date: 12/20/2018  PT End of Session - 12/20/18 1610    Visit Number  10    Date for PT Re-Evaluation  01/10/19    Authorization Type  [employer]    PT Start Time  1530    PT Stop Time  1610    PT Time Calculation (min)  40 min    Activity Tolerance  Patient tolerated treatment well;No increased pain    Behavior During Therapy  WFL for tasks assessed/performed       Past Medical History:  Diagnosis Date  . Cysts    liver  . Diabetes mellitus ([place])   . Gallstones    asymptomatic  . Hyperlipidemia   . Obesity   . Prostatitis    requiring a trip to the ER in [place] with a fever - 09    Past Surgical History:  Procedure Laterality Date  . HERNIA REPAIR     Childhood    There were no vitals filed for this visit.  Subjective Assessment - 12/20/18 1540    Subjective  I am doing well. I found my missing keys.    Patient Stated Goals  work on gait    Currently in Pain?  No/denies         [facility] PT Assessment - 12/20/18 0001      Assessment   Medical Diagnosis  R26.81 Gait instability    Referring Provider (PT)  Dr. Ellouise Newer      Precautions   Precautions  None      Restrictions   Weight Bearing Restrictions  No      Home Environment   Living Environment  Private residence      Prior Function   Level of Independence  Independent      Cognition   Overall Cognitive Status  Within Functional Limits for tasks assessed      Observation/Other Assessments   Observations  stand against the wall and measure from the wall to crown of head is 26 cm      Posture/Postural Control   Posture/Postural Control  Postural limitations     Postural Limitations  Forward head;Rounded Shoulders;Increased thoracic kyphosis;Flexed trunk      Strength   Right Hip ABduction  4+/5    Right Hip ADduction  4+/5    Left Hip Extension  4-/5    Left Hip ABduction  5/5    Left Hip ADduction  5/5      Dynamic Gait Index   Level Surface  Mild Impairment    Change in Gait Speed  Mild Impairment    Gait with Horizontal Head Turns  Mild Impairment    Gait with Vertical Head Turns  Mild Impairment    Gait and Pivot Turn  Mild Impairment    Step Over Obstacle  Mild Impairment    Step Around Obstacles  Moderate Impairment    Steps  Mild Impairment    Total Score  15      Timed Up and Go Test   Normal TUG (seconds)  13                   [facility] Adult PT Treatment/Exercise - 12/20/18 0001  Neck Exercises: Standing   Other Standing Exercises  stand on foam keeping balance, stand and close eyes, stand and move arms up and down in a corner      Neck Exercises: Seated   Neck Retraction  15 reps;3 secs    Neck Retraction Limitations  press into ball       Knee/Hip Exercises: Aerobic   Nustep  Seat 9, L3 x8 min PT present to discuss current HEP      Knee/Hip Exercises: Standing   Other Standing Knee Exercises  walking up and down stairs with step over step and step over step keeping head up using railings      Knee/Hip Exercises: Seated   Sit to Sand  10 reps;with UE support   working on hip extension              PT Short Term Goals - 12/20/18 1614      PT SHORT TERM GOAL #1   Title  independent with initial HEP    Time  4    Period  Weeks    Status  Partially Met    Target Date  12/13/18      PT SHORT TERM GOAL #2   Title  stand with his head </= 23 cm due to increased trunk strength and improved posture    Baseline  20 cm    Time  4    Period  Weeks    Status  Achieved    Target Date  12/13/18      PT SHORT TERM GOAL #4   Title  Dynamic Gait index >/= 18 due to improved gait and strength     Baseline  15    Time  4    Period  Weeks    Status  On-going    Target Date  12/13/18        PT Long Term Goals - 12/20/18 1615      PT LONG TERM GOAL #1   Title  Pt will be ind with advanced HEP    Time  8    Period  Weeks    Status  On-going      PT LONG TERM GOAL #2   Title  stand with his head from the wall </= 20 cm due to increased trunk strength and posture    Time  8    Period  Weeks    Status  On-going      PT LONG TERM GOAL #3   Title  able to walk for 2 minutes without verbal cues to keep his head up due to increased strength    Time  8    Period  Weeks    Status  On-going      PT LONG TERM GOAL #4   Title  Dynamic Gait Index >/= 20 due to improve balance    Baseline  15    Time  8    Period  Weeks    Status  On-going            Plan - 12/20/18 1611    Clinical Impression Statement  Patient is able to og up and down steps with a step over step pattern holding onto the railing with minimal verbal cues to keep his head up. Patient is able to stand with head by wall at 26 cm compared to 29 cm. Patient has increased strength of bilateral hips. Dynamic Gait Index is 15 now. Patient is now abl eto  do the nustep for 8 min. Patient is able to close his eye while standing on foam for 10 seconds before his trunk goes to the left. Patient has not had any near miss falls for 2 weeks. Patient will benefit from skilled therapy to improve his gait and balance to redue his risk of falls.    Personal Factors and Comorbidities  Age;Fitness    Examination-Activity Limitations  Locomotion Level;Stairs;Stand    Insurance claims handler;Shop    Stability/Clinical Decision Making  Stable/Uncomplicated    Rehab Potential  Excellent    PT Frequency  2x / week    PT Duration  8 weeks    PT Treatment/Interventions  Gait training;Stair training;Functional mobility training;Therapeutic activities;Therapeutic exercise;Balance training;Neuromuscular  re-education;Patient/family education;Manual techniques    PT Next Visit Plan  work on balance, work on [place]  Access Code: [email or web address]    Consulted and Agree with Plan of Care  Patient       Patient will benefit from skilled therapeutic intervention in order to improve the following deficits and impairments:  Abnormal gait, Difficulty walking, Decreased endurance, Decreased balance, Decreased strength, Decreased mobility  Visit Diagnosis: Other abnormalities of gait and mobility  Abnormal posture  Muscle weakness (generalized)     Problem List Patient Active Problem List   Diagnosis Date Noted  . Hearing loss of right ear 01/23/2017  . Mild cognitive impairment 09/13/2016  . Type 2 diabetes mellitus without complication, without long-term current use of insulin ([place]) 03/30/2015  . Short-term memory loss 02/03/2015  . H/O hyperthyroidism 01/28/2013  . ERECTILE DYSFUNCTION, MILD 02/19/2008  . Allergic rhinitis 02/19/2008  . Hyperlipidemia 02/15/2007  . HEPATIC CYST 02/15/2007    Earlie Counts, PT 12/20/18 4:17 PM   [facility] [address] [place], [place], [address] Phone: [phone]   Fax:  [phone]  Name: SIDNEY KANN MRN: [id number] Date of Birth: 09/23/1935

## 2018-12-20 NOTE — Telephone Encounter (Signed)
Sent to the pharmacy by e-scribe. 

## 2018-12-25 ENCOUNTER — Ambulatory Visit: Payer: PPO | Admitting: Physical Therapy

## 2018-12-25 ENCOUNTER — Encounter: Payer: Self-pay | Admitting: Physical Therapy

## 2018-12-25 ENCOUNTER — Other Ambulatory Visit: Payer: Self-pay

## 2018-12-25 DIAGNOSIS — R293 Abnormal posture: Secondary | ICD-10-CM

## 2018-12-25 DIAGNOSIS — R2689 Other abnormalities of gait and mobility: Secondary | ICD-10-CM | POA: Diagnosis not present

## 2018-12-25 DIAGNOSIS — M6281 Muscle weakness (generalized): Secondary | ICD-10-CM

## 2018-12-25 NOTE — Therapy (Signed)
[facility] [address] [place], [place], [address] Phone: [phone]   Fax:  [phone]  Physical Therapy Treatment  Patient Details  Name: Erik Lara MRN: [id number] Date of Birth: 18-Mar-1935 Referring Provider (PT): Dr. Ellouise Newer   Encounter Date: 12/25/2018  PT End of Session - 12/25/18 1600    Visit Number  11    Date for PT Re-Evaluation  01/10/19    Authorization Type  [employer]    PT Start Time  1530    PT Stop Time  1611    PT Time Calculation (min)  41 min    Activity Tolerance  Patient tolerated treatment well;No increased pain    Behavior During Therapy  WFL for tasks assessed/performed       Past Medical History:  Diagnosis Date  . Cysts    liver  . Diabetes mellitus ([place])   . Gallstones    asymptomatic  . Hyperlipidemia   . Obesity   . Prostatitis    requiring a trip to the ER in [place] with a fever - 09    Past Surgical History:  Procedure Laterality Date  . HERNIA REPAIR     Childhood    There were no vitals filed for this visit.  Subjective Assessment - 12/25/18 1548    Subjective  Pt states that his exercises at home are going slow. Some he does more than he is required to do.    Patient Stated Goals  work on gait    Currently in Pain?  No/denies                       [facility] Adult PT Treatment/Exercise - 12/25/18 0001      Knee/Hip Exercises: Aerobic   Nustep  Seat 9, L3 x8 min PT present to discuss HEP       Knee/Hip Exercises: Standing   Hip Abduction  Stengthening;Left;Right;1 set;15 reps;Knee straight    Abduction Limitations  red TB around ankles     Forward Step Up  Both;1 set;10 reps    Forward Step Up Limitations  1 UE support, contralateral LE flexion      Neck Exercises: Stretches   Other Neck Stretches  Rt UE ranger L35 flexion with cervical/thoracic extension to look at point above him on the wall x20 reps            Balance Exercises - 12/25/18 1553      Balance Exercises: Standing   Standing Eyes Opened  Foam/compliant surface;Head turns   2x15 reps    Sidestepping  4 reps   47f   Other Standing Exercises  walking forward weaving in/out of cones x3 trials         PT Education - 12/25/18 1559    Education Details  impact posture has on shoulder reach/use    Person(s) Educated  Patient    Methods  Explanation;Verbal cues    Comprehension  Verbalized understanding;Returned demonstration       PT Short Term Goals - 12/20/18 1614      PT SHORT TERM GOAL #1   Title  independent with initial HEP    Time  4    Period  Weeks    Status  Partially Met    Target Date  12/13/18      PT SHORT TERM GOAL #2   Title  stand with his head </= 23 cm due to increased trunk strength and  improved posture    Baseline  20 cm    Time  4    Period  Weeks    Status  Achieved    Target Date  12/13/18      PT SHORT TERM GOAL #4   Title  Dynamic Gait index >/= 18 due to improved gait and strength    Baseline  15    Time  4    Period  Weeks    Status  On-going    Target Date  12/13/18        PT Long Term Goals - 12/20/18 1615      PT LONG TERM GOAL #1   Title  Pt will be ind with advanced HEP    Time  8    Period  Weeks    Status  On-going      PT LONG TERM GOAL #2   Title  stand with his head from the wall </= 20 cm due to increased trunk strength and posture    Time  8    Period  Weeks    Status  On-going      PT LONG TERM GOAL #3   Title  able to walk for 2 minutes without verbal cues to keep his head up due to increased strength    Time  8    Period  Weeks    Status  On-going      PT LONG TERM GOAL #4   Title  Dynamic Gait Index >/= 20 due to improve balance    Baseline  15    Time  8    Period  Weeks    Status  On-going            Plan - 12/25/18 1601    Clinical Impression Statement  Pt continues to work on his HEP but required some education on ways to get  the theraband around his feet. Focused today on both strengthening and balance activity to improve pt's safety with daily activity. Pt struggled with balance activity requiring weaving in/out of cones as well as sidestepping both directions. He demonstrated slowed gait and required close supervision to avoid LOB. Pt's posture correction appears improved today, and he was able to visualize a target above his head with Rt UE reach without the need for therapist tactile cuing. Will continue with current POC.    Personal Factors and Comorbidities  Age;Fitness    Examination-Activity Limitations  Locomotion Level;Stairs;Stand    Insurance claims handler;Shop    Stability/Clinical Decision Making  Stable/Uncomplicated    Rehab Potential  Excellent    PT Frequency  2x / week    PT Duration  8 weeks    PT Treatment/Interventions  Gait training;Stair training;Functional mobility training;Therapeutic activities;Therapeutic exercise;Balance training;Neuromuscular re-education;Patient/family education;Manual techniques    PT Next Visit Plan  work on balance, work on [place]  Access Code: [email or web address]    Consulted and Agree with Plan of Care  Patient       Patient will benefit from skilled therapeutic intervention in order to improve the following deficits and impairments:  Abnormal gait, Difficulty walking, Decreased endurance, Decreased balance, Decreased strength, Decreased mobility  Visit Diagnosis: Other abnormalities of gait and mobility  Abnormal posture  Muscle weakness (generalized)     Problem List Patient Active Problem List   Diagnosis Date Noted  . Hearing loss of right ear 01/23/2017  . Mild cognitive impairment 09/13/2016  .  Type 2 diabetes mellitus without complication, without long-term current use of insulin ([place]) 03/30/2015  . Short-term memory loss 02/03/2015  . H/O hyperthyroidism 01/28/2013  . ERECTILE DYSFUNCTION, MILD  02/19/2008  . Allergic rhinitis 02/19/2008  . Hyperlipidemia 02/15/2007  . HEPATIC CYST 02/15/2007   4:17 PM,12/25/18 Sherol Dade PT, DPT [place] [facility] [address] [place], [place], [address] Phone: [phone]   Fax:  [phone]  Name: Erik Lara MRN: [id number] Date of Birth: Apr 22, 1935

## 2018-12-27 ENCOUNTER — Other Ambulatory Visit: Payer: Self-pay

## 2018-12-27 ENCOUNTER — Ambulatory Visit: Payer: PPO | Admitting: Physical Therapy

## 2018-12-27 DIAGNOSIS — R293 Abnormal posture: Secondary | ICD-10-CM

## 2018-12-27 DIAGNOSIS — R2689 Other abnormalities of gait and mobility: Secondary | ICD-10-CM | POA: Diagnosis not present

## 2018-12-27 DIAGNOSIS — M6281 Muscle weakness (generalized): Secondary | ICD-10-CM

## 2018-12-27 NOTE — Therapy (Signed)
[facility] [address] [place], [place], [address] Phone: [phone]   Fax:  [phone]  Physical Therapy Treatment  Patient Details  Name: Erik Lara MRN: [id number] Date of Birth: [birth year] Referring Provider (PT): Dr. Ellouise Newer   Encounter Date: 12/27/2018  PT End of Session - 12/27/18 1612    Visit Number  12    Date for PT Re-Evaluation  01/10/19    Authorization Type  [employer]    PT Start Time  1534    PT Stop Time  5035    PT Time Calculation (min)  39 min    Activity Tolerance  Patient tolerated treatment well;No increased pain    Behavior During Therapy  WFL for tasks assessed/performed       Past Medical History:  Diagnosis Date  . Cysts    liver  . Diabetes mellitus ([place])   . Gallstones    asymptomatic  . Hyperlipidemia   . Obesity   . Prostatitis    requiring a trip to the ER in [place] with a fever - 09    Past Surgical History:  Procedure Laterality Date  . HERNIA REPAIR     Childhood    There were no vitals filed for this visit.  Subjective Assessment - 12/27/18 1558    Subjective  Pt has no complaints. He woke up today without shoulder pain which was nice.    Patient Stated Goals  work on gait    Currently in Pain?  No/denies                       [facility] Adult PT Treatment/Exercise - 12/27/18 0001      Neck Exercises: Standing   Other Standing Exercises  RUE finger ladder x5 reps with therapist providing tactile cuing for thoracic extension      Neck Exercises: Seated   Neck Retraction  10 reps;3 secs    Neck Retraction Limitations  press back of head into ball     Other Seated Exercise  seated thoracic extension over foam roll 2x10 reps mid and lower region; thoracic extension with chest expansion x15 reps     Other Seated Exercise  thoracic rotation Lt/Rt x10 reps each       Knee/Hip Exercises: Stretches   Passive Hamstring  Stretch  2 reps;Both;30 seconds    Passive Hamstring Stretch Limitations  long sitting at table       Manual Therapy   Passive ROM  thoracic extension active assisted ROM Lt/Rt x10 reps             PT Education - 12/27/18 1617    Education Details  addition to HEP    Person(s) Educated  Patient    Methods  Explanation;Handout    Comprehension  Verbalized understanding;Returned demonstration       PT Short Term Goals - 12/20/18 1614      PT SHORT TERM GOAL #1   Title  independent with initial HEP    Time  4    Period  Weeks    Status  Partially Met    Target Date  12/13/18      PT SHORT TERM GOAL #2   Title  stand with his head </= 23 cm due to increased trunk strength and improved posture    Baseline  20 cm    Time  4    Period  Weeks    Status  Achieved    Target Date  12/13/18      PT SHORT TERM GOAL #4   Title  Dynamic Gait index >/= 18 due to improved gait and strength    Baseline  15    Time  4    Period  Weeks    Status  On-going    Target Date  12/13/18        PT Long Term Goals - 12/20/18 1615      PT LONG TERM GOAL #1   Title  Pt will be ind with advanced HEP    Time  8    Period  Weeks    Status  On-going      PT LONG TERM GOAL #2   Title  stand with his head from the wall </= 20 cm due to increased trunk strength and posture    Time  8    Period  Weeks    Status  On-going      PT LONG TERM GOAL #3   Title  able to walk for 2 minutes without verbal cues to keep his head up due to increased strength    Time  8    Period  Weeks    Status  On-going      PT LONG TERM GOAL #4   Title  Dynamic Gait Index >/= 20 due to improve balance    Baseline  15    Time  8    Period  Weeks    Status  On-going            Plan - 12/27/18 1613    Clinical Impression Statement  Pt is noticing improvements in his shoulder pain throughout the day, reporting no pain this morning when getting out of bed. He is also noting that he can reach overhead  with more ease compared to previous attempts. Session continued with focus on postural strength and mobility of the thoracic spine. Pt was provided an additional hamstring stretch for home. He demonstrated understanding of this. No pain was reported end of session.    Personal Factors and Comorbidities  Age;Fitness    Examination-Activity Limitations  Locomotion Level;Stairs;Stand    Insurance claims handler;Shop    Stability/Clinical Decision Making  Stable/Uncomplicated    Rehab Potential  Excellent    PT Frequency  2x / week    PT Duration  8 weeks    PT Treatment/Interventions  Gait training;Stair training;Functional mobility training;Therapeutic activities;Therapeutic exercise;Balance training;Neuromuscular re-education;Patient/family education;Manual techniques    PT Next Visit Plan  work on balance, work on [place]  Access Code: [place] and Agree with Plan of Care  Patient       Patient will benefit from skilled therapeutic intervention in order to improve the following deficits and impairments:  Abnormal gait, Difficulty walking, Decreased endurance, Decreased balance, Decreased strength, Decreased mobility  Visit Diagnosis: Other abnormalities of gait and mobility  Abnormal posture  Muscle weakness (generalized)     Problem List Patient Active Problem List   Diagnosis Date Noted  . Hearing loss of right ear 01/23/2017  . Mild cognitive impairment 09/13/2016  . Type 2 diabetes mellitus without complication, without long-term current use of insulin ([place]) 03/30/2015  . Short-term memory loss 02/03/2015  . H/O hyperthyroidism 01/28/2013  . ERECTILE DYSFUNCTION, MILD 02/19/2008  . Allergic rhinitis 02/19/2008  . Hyperlipidemia 02/15/2007  . HEPATIC CYST 02/15/2007    4:18  PM,12/27/18 Sherol Dade PT, DPT [place] [facility] [address], [place], [place], [address] Phone: [phone]   Fax:  [phone]  Name: Erik Lara MRN: [id number] Date of Birth: [birth year]

## 2018-12-27 NOTE — Patient Instructions (Signed)
Access Code: [email or web address]  URL: [email or web address]  Date: 12/27/2018  Prepared by: Sherol Dade   Exercises  Standing Hip Abduction - 10 reps - 2 sets - 1x daily - 7x weekly  Seated Thoracic Lumbar Extension - 10 reps - 2x daily - 7x weekly  Standing Hip Extension - 20 reps - 2 sets - 1x daily - 7x weekly  Seated Shoulder Horizontal Abduction with Resistance - 10 reps - 3 sets - 1x daily - 7x weekly  Seated Table Hamstring Stretch - 2 reps - 30 hold - 1x daily - 7x weekly    [facility] [address] [place], [place] [address] Phone # [phone] Fax [phone]

## 2018-12-30 ENCOUNTER — Other Ambulatory Visit: Payer: Self-pay | Admitting: Adult Health

## 2019-01-01 ENCOUNTER — Ambulatory Visit: Payer: PPO | Admitting: Physical Therapy

## 2019-01-01 ENCOUNTER — Other Ambulatory Visit: Payer: Self-pay

## 2019-01-01 ENCOUNTER — Encounter: Payer: Self-pay | Admitting: Physical Therapy

## 2019-01-01 DIAGNOSIS — R293 Abnormal posture: Secondary | ICD-10-CM

## 2019-01-01 DIAGNOSIS — M6281 Muscle weakness (generalized): Secondary | ICD-10-CM

## 2019-01-01 DIAGNOSIS — R2689 Other abnormalities of gait and mobility: Secondary | ICD-10-CM | POA: Diagnosis not present

## 2019-01-01 NOTE — Therapy (Signed)
[facility] [address] [place], [place], [address] Phone: [phone]   Fax:  [phone]  Physical Therapy Treatment  Patient Details  Name: Erik Lara MRN: [id number] Date of Birth: 01-16-1936 Referring Provider (PT): Dr. Ellouise Newer   Encounter Date: 01/01/2019  PT End of Session - 01/01/19 1614    Visit Number  13    Date for PT Re-Evaluation  01/10/19    Authorization Type  [employer]    PT Start Time  1530    PT Stop Time  1613    PT Time Calculation (min)  43 min    Activity Tolerance  Patient tolerated treatment well;No increased pain    Behavior During Therapy  WFL for tasks assessed/performed       Past Medical History:  Diagnosis Date  . Cysts    liver  . Diabetes mellitus ([place])   . Gallstones    asymptomatic  . Hyperlipidemia   . Obesity   . Prostatitis    requiring a trip to the ER in [place] with a fever - 09    Past Surgical History:  Procedure Laterality Date  . HERNIA REPAIR     Childhood    There were no vitals filed for this visit.  Subjective Assessment - 01/01/19 1531    Subjective  Pt states that things are going well. No complaints at this time.    Patient Stated Goals  work on gait    Currently in Pain?  No/denies                       [facility] Adult PT Treatment/Exercise - 01/01/19 0001      Neck Exercises: Seated   Other Seated Exercise  BUE horizontal abduction with green TB x15 reps       Neck Exercises: Sidelying   Other Sidelying Exercise  thoracic rotation x15 reps each direction, therapist providing assistance into rotation and blocking pelvis       Knee/Hip Exercises: Stretches   Passive Hamstring Stretch  Both;1 rep;30 seconds    Passive Hamstring Stretch Limitations  long sitting at mat table       Knee/Hip Exercises: Aerobic   Nustep  L3 x5 min PT present to encourag upright posture           Balance Exercises  - 01/01/19 1553      Balance Exercises: Standing   Standing Eyes Opened  Foam/compliant surface   posture correction calling out colors held in front of him   Tandem Stance  Eyes open   partial tandem: posture correction cuing     Other Standing Exercises  floor ladder forward and sidestepping x2 trials each; sidestepping with visual cue 2x49f, walking backwards with visual cue x20 ft         PT Education - 01/01/19 1613    Education Details  increased HEP to green TB    Person(s) Educated  Patient    Methods  Explanation    Comprehension  Verbalized understanding       PT Short Term Goals - 12/20/18 1614      PT SHORT TERM GOAL #1   Title  independent with initial HEP    Time  4    Period  Weeks    Status  Partially Met    Target Date  12/13/18      PT SHORT TERM GOAL #2   Title  stand with his  head </= 23 cm due to increased trunk strength and improved posture    Baseline  20 cm    Time  4    Period  Weeks    Status  Achieved    Target Date  12/13/18      PT SHORT TERM GOAL #4   Title  Dynamic Gait index >/= 18 due to improved gait and strength    Baseline  15    Time  4    Period  Weeks    Status  On-going    Target Date  12/13/18        PT Long Term Goals - 12/20/18 1615      PT LONG TERM GOAL #1   Title  Pt will be ind with advanced HEP    Time  8    Period  Weeks    Status  On-going      PT LONG TERM GOAL #2   Title  stand with his head from the wall </= 20 cm due to increased trunk strength and posture    Time  8    Period  Weeks    Status  On-going      PT LONG TERM GOAL #3   Title  able to walk for 2 minutes without verbal cues to keep his head up due to increased strength    Time  8    Period  Weeks    Status  On-going      PT LONG TERM GOAL #4   Title  Dynamic Gait Index >/= 20 due to improve balance    Baseline  15    Time  8    Period  Weeks    Status  On-going            Plan - 01/01/19 1614    Clinical Impression  Statement  Pt appears to be ambulating with improved posture into the clinic today. Session focused on increasing posture with standing balance activity. Pt struggles to maintain tandem stance, so this was adjusted to partial tandem and intermittent cuing was required to increase hip extension with this. Also completed more dynamic activity such as stepping in the floor ladder and pt required close supervision to prevent LOB. Ended with theraband resistance increase with his HEP. No increase in pain was reported end of session.    Personal Factors and Comorbidities  Age;Fitness    Examination-Activity Limitations  Locomotion Level;Stairs;Stand    Insurance claims handler;Shop    Stability/Clinical Decision Making  Stable/Uncomplicated    Rehab Potential  Excellent    PT Frequency  2x / week    PT Duration  8 weeks    PT Treatment/Interventions  Gait training;Stair training;Functional mobility training;Therapeutic activities;Therapeutic exercise;Balance training;Neuromuscular re-education;Patient/family education;Manual techniques    PT Next Visit Plan  work on balance, work on [place]  Access Code: [place] and Agree with Plan of Care  Patient       Patient will benefit from skilled therapeutic intervention in order to improve the following deficits and impairments:  Abnormal gait, Difficulty walking, Decreased endurance, Decreased balance, Decreased strength, Decreased mobility  Visit Diagnosis: Other abnormalities of gait and mobility  Abnormal posture  Muscle weakness (generalized)     Problem List Patient Active Problem List   Diagnosis Date Noted  . Hearing loss of right ear 01/23/2017  . Mild cognitive impairment 09/13/2016  . Type 2  diabetes mellitus without complication, without long-term current use of insulin ([place]) 03/30/2015  . Short-term memory loss 02/03/2015  . H/O hyperthyroidism  01/28/2013  . ERECTILE DYSFUNCTION, MILD 02/19/2008  . Allergic rhinitis 02/19/2008  . Hyperlipidemia 02/15/2007  . HEPATIC CYST 02/15/2007    4:16 PM,01/01/19 Sherol Dade PT, DPT [place] [facility] [address] [place], [place], [address] Phone: [phone]   Fax:  [phone]  Name: Erik Lara MRN: [id number] Date of Birth: March 17, 1935

## 2019-01-03 ENCOUNTER — Ambulatory Visit: Payer: PPO | Admitting: Physical Therapy

## 2019-01-03 ENCOUNTER — Other Ambulatory Visit: Payer: Self-pay

## 2019-01-03 DIAGNOSIS — M25562 Pain in left knee: Secondary | ICD-10-CM

## 2019-01-03 DIAGNOSIS — R293 Abnormal posture: Secondary | ICD-10-CM

## 2019-01-03 DIAGNOSIS — R2689 Other abnormalities of gait and mobility: Secondary | ICD-10-CM

## 2019-01-03 DIAGNOSIS — M6281 Muscle weakness (generalized): Secondary | ICD-10-CM

## 2019-01-03 DIAGNOSIS — M25662 Stiffness of left knee, not elsewhere classified: Secondary | ICD-10-CM

## 2019-01-03 NOTE — Therapy (Signed)
[facility] [address] [place], [place], [address] Phone: [phone]   Fax:  [phone]  Physical Therapy Treatment  Patient Details  Name: Erik Lara MRN: [id number] Date of Birth: [birth year] Referring Provider (PT): Dr. Ellouise Newer   Encounter Date: 01/03/2019  PT End of Session - 01/03/19 1705    Visit Number  14    Date for PT Re-Evaluation  01/10/19    Authorization Type  [employer]    PT Start Time  1532    PT Stop Time  1610    PT Time Calculation (min)  38 min    Activity Tolerance  Patient tolerated treatment well;No increased pain       Past Medical History:  Diagnosis Date  . Cysts    liver  . Diabetes mellitus ([place])   . Gallstones    asymptomatic  . Hyperlipidemia   . Obesity   . Prostatitis    requiring a trip to the ER in [place] with a fever - 09    Past Surgical History:  Procedure Laterality Date  . HERNIA REPAIR     Childhood    There were no vitals filed for this visit.  Subjective Assessment - 01/03/19 1707    Subjective  No shoulder pain today.  Likes doing band ex at home since it makes his shoulder feel better.    Currently in Pain?  No/denies                       [facility] Adult PT Treatment/Exercise - 01/03/19 0001      Therapeutic Activites    Therapeutic Activities  ADL's    ADL's  lifting, sit to stand, standing, walking, stair climbing       Neck Exercises: Theraband   Shoulder Extension  15 reps;Green    Shoulder Extension Limitations  standing       Neck Exercises: Seated   Neck Retraction  10 reps;3 secs    Neck Retraction Limitations  press back of head into ball     Other Seated Exercise  thoracic extension over blue ball 10x       Knee/Hip Exercises: Standing   Heel Raises  10 reps    Forward Step Up  Both;1 set;10 reps    Functional Squat Limitations  hip hinge 5# to knee level to waist level 15x     Gait  Training  wall climbs UE/LE same side     Other Standing Knee Exercises  step taps 1 minute     Other Standing Knee Exercises  wall push ups 1 minute       Knee/Hip Exercises: Seated   Sit to Sand  2 sets;5 reps;without UE support   holding 5# weight plus black cushion              PT Short Term Goals - 12/20/18 1614      PT SHORT TERM GOAL #1   Title  independent with initial HEP    Time  4    Period  Weeks    Status  Partially Met    Target Date  12/13/18      PT SHORT TERM GOAL #2   Title  stand with his head </= 23 cm due to increased trunk strength and improved posture    Baseline  20 cm    Time  4    Period  Weeks    Status  Achieved    Target Date  12/13/18      PT SHORT TERM GOAL #4   Title  Dynamic Gait index >/= 18 due to improved gait and strength    Baseline  15    Time  4    Period  Weeks    Status  On-going    Target Date  12/13/18        PT Long Term Goals - 12/20/18 1615      PT LONG TERM GOAL #1   Title  Pt will be ind with advanced HEP    Time  8    Period  Weeks    Status  On-going      PT LONG TERM GOAL #2   Title  stand with his head from the wall </= 20 cm due to increased trunk strength and posture    Time  8    Period  Weeks    Status  On-going      PT LONG TERM GOAL #3   Title  able to walk for 2 minutes without verbal cues to keep his head up due to increased strength    Time  8    Period  Weeks    Status  On-going      PT LONG TERM GOAL #4   Title  Dynamic Gait Index >/= 20 due to improve balance    Baseline  15    Time  8    Period  Weeks    Status  On-going            Plan - 01/03/19 1706    Clinical Impression Statement  The patient requires max cues today to hold his head up during standing, walking and seated ex's.  Posture may be a contributing factor in decreased balance.  He [place] able to do some UE reaching without production of shoulder pain.  He needs some verbal cues at times to re-focus on the  activity/exercise.   Therapist also providing close supervision for safety and to monitor response with interventions.    Rehab Potential  Excellent    PT Frequency  2x / week    PT Duration  8 weeks    PT Treatment/Interventions  Gait training;Stair training;Functional mobility training;Therapeutic activities;Therapeutic exercise;Balance training;Neuromuscular re-education;Patient/family education;Manual techniques    PT Next Visit Plan  start [place];  work on balance, work on [place]  Access Code: [facility]       Patient will benefit from skilled therapeutic intervention in order to improve the following deficits and impairments:  Abnormal gait, Difficulty walking, Decreased endurance, Decreased balance, Decreased strength, Decreased mobility  Visit Diagnosis: Other abnormalities of gait and mobility  Abnormal posture  Muscle weakness (generalized)  Acute pain of left knee  Stiffness of left knee, not elsewhere classified     Problem List Patient Active Problem List   Diagnosis Date Noted  . Hearing loss of right ear 01/23/2017  . Mild cognitive impairment 09/13/2016  . Type 2 diabetes mellitus without complication, without long-term current use of insulin ([place]) 03/30/2015  . Short-term memory loss 02/03/2015  . H/O hyperthyroidism 01/28/2013  . ERECTILE DYSFUNCTION, MILD 02/19/2008  . Allergic rhinitis 02/19/2008  . Hyperlipidemia 02/15/2007  . HEPATIC CYST 02/15/2007   Ruben Im, PT 01/03/19 5:18 PM Phone: [phone] Fax: [phone] Alvera Singh 01/03/2019, 5:17 PM  [facility] [address], [place], [place], [address]  Phone: [phone]   Fax:  [phone]  Name: Erik Lara MRN: [id number] Date of Birth: 27-Dec-1935

## 2019-01-08 ENCOUNTER — Encounter: Payer: Self-pay | Admitting: Physical Therapy

## 2019-01-08 ENCOUNTER — Other Ambulatory Visit: Payer: Self-pay

## 2019-01-08 ENCOUNTER — Ambulatory Visit: Payer: PPO | Admitting: Physical Therapy

## 2019-01-08 DIAGNOSIS — R293 Abnormal posture: Secondary | ICD-10-CM

## 2019-01-08 DIAGNOSIS — M6281 Muscle weakness (generalized): Secondary | ICD-10-CM

## 2019-01-08 DIAGNOSIS — R2689 Other abnormalities of gait and mobility: Secondary | ICD-10-CM

## 2019-01-08 NOTE — Therapy (Signed)
[facility] [address], [place], [place], [address] Phone: [phone]   Fax:  [phone]  Physical Therapy Treatment  Patient Details  Name: Erik Lara MRN: [id number] Date of Birth: 16-May-1935 Referring Provider (PT): Dr. Ellouise Newer   Encounter Date: 01/08/2019  PT End of Session - 01/08/19 1534    Visit Number  15    Date for PT Re-Evaluation  01/10/19    Authorization Type  [employer]    PT Start Time  1534    PT Stop Time  3545    PT Time Calculation (min)  40 min    Activity Tolerance  Patient tolerated treatment well;No increased pain    Behavior During Therapy  WFL for tasks assessed/performed       Past Medical History:  Diagnosis Date  . Cysts    liver  . Diabetes mellitus ([place])   . Gallstones    asymptomatic  . Hyperlipidemia   . Obesity   . Prostatitis    requiring a trip to the ER in [place] with a fever - 09    Past Surgical History:  Procedure Laterality Date  . HERNIA REPAIR     Childhood    There were no vitals filed for this visit.                    [facility] Adult PT Treatment/Exercise - 01/08/19 0001      Neck Exercises: Theraband   Shoulder Extension  15 reps;Green    Shoulder Extension Limitations  standing       Neck Exercises: Standing   Upper Extremity Flexion with Stabilization  Extension;10 reps    UE Flexion with Stabilization Limitations  with ball and eyes following ball    Lift / Chop  10 reps   bil   Left / Chop Limitations  with ball and eyes following ball      Neck Exercises: Supine   Neck Retraction  10 reps    Neck Retraction Limitations  pressing head into folded pillow      Knee/Hip Exercises: Standing   Heel Raises  10 reps    Hip Extension  Stengthening;Both;2 sets;10 reps    Gait Training  wall climbs UE/LE same side  x 10 ea side      Knee/Hip Exercises: Seated   Sit to Sand  without UE support;2 sets;10  reps   5# wt with cushion 1st set; then mat table no wt 2nd set              PT Short Term Goals - 12/20/18 1614      PT SHORT TERM GOAL #1   Title  independent with initial HEP    Time  4    Period  Weeks    Status  Partially Met    Target Date  12/13/18      PT SHORT TERM GOAL #2   Title  stand with his head </= 23 cm due to increased trunk strength and improved posture    Baseline  20 cm    Time  4    Period  Weeks    Status  Achieved    Target Date  12/13/18      PT SHORT TERM GOAL #4   Title  Dynamic Gait index >/= 18 due to improved gait and strength    Baseline  15    Time  4    Period  Weeks  Status  On-going    Target Date  12/13/18        PT Long Term Goals - 12/20/18 1615      PT LONG TERM GOAL #1   Title  Pt will be ind with advanced HEP    Time  8    Period  Weeks    Status  On-going      PT LONG TERM GOAL #2   Title  stand with his head from the wall </= 20 cm due to increased trunk strength and posture    Time  8    Period  Weeks    Status  On-going      PT LONG TERM GOAL #3   Title  able to walk for 2 minutes without verbal cues to keep his head up due to increased strength    Time  8    Period  Weeks    Status  On-going      PT LONG TERM GOAL #4   Title  Dynamic Gait Index >/= 20 due to improve balance    Baseline  15    Time  8    Period  Weeks    Status  On-going            Plan - 01/08/19 1615    Clinical Impression Statement  Patient did well with TE today progressing with sit to stand from mat table vs. cushion. He requires cueing to perform thoracic extension and rotation exercises correctly with head movement. Had difficulty and some c/o pain with supine chin tucks.    PT Treatment/Interventions  Gait training;Stair training;Functional mobility training;Therapeutic activities;Therapeutic exercise;Balance training;Neuromuscular re-education;Patient/family education;Manual techniques    PT Next Visit Plan  KX;   reassess; work on balance, work on HEP independence       Patient will benefit from skilled therapeutic intervention in order to improve the following deficits and impairments:  Abnormal gait, Difficulty walking, Decreased endurance, Decreased balance, Decreased strength, Decreased mobility  Visit Diagnosis: Other abnormalities of gait and mobility  Abnormal posture  Muscle weakness (generalized)     Problem List Patient Active Problem List   Diagnosis Date Noted  . Hearing loss of right ear 01/23/2017  . Mild cognitive impairment 09/13/2016  . Type 2 diabetes mellitus without complication, without long-term current use of insulin ([place]) 03/30/2015  . Short-term memory loss 02/03/2015  . H/O hyperthyroidism 01/28/2013  . ERECTILE DYSFUNCTION, MILD 02/19/2008  . Allergic rhinitis 02/19/2008  . Hyperlipidemia 02/15/2007  . HEPATIC CYST 02/15/2007   Madelyn Flavors PT 01/08/2019, 5:14 PM  [facility] [address] [place], [place], [address] Phone: [phone]   Fax:  [phone]  Name: Erik Lara MRN: [id number] Date of Birth: [birth year]

## 2019-01-09 ENCOUNTER — Other Ambulatory Visit: Payer: Self-pay | Admitting: Adult Health

## 2019-01-10 ENCOUNTER — Encounter: Payer: Self-pay | Admitting: Physical Therapy

## 2019-01-10 ENCOUNTER — Ambulatory Visit: Payer: PPO | Admitting: Physical Therapy

## 2019-01-10 ENCOUNTER — Other Ambulatory Visit: Payer: Self-pay

## 2019-01-10 DIAGNOSIS — R293 Abnormal posture: Secondary | ICD-10-CM

## 2019-01-10 DIAGNOSIS — R2689 Other abnormalities of gait and mobility: Secondary | ICD-10-CM | POA: Diagnosis not present

## 2019-01-10 DIAGNOSIS — M6281 Muscle weakness (generalized): Secondary | ICD-10-CM

## 2019-01-10 NOTE — Therapy (Signed)
[facility] [address] [place], [place], [address] Phone: [phone]   Fax:  [phone]  Physical Therapy Treatment/Discharge Summary   Patient Details  Name: Erik Lara MRN: [id number] Date of Birth: 1936-01-11 Referring Provider (PT): Dr. Ellouise Newer   Encounter Date: 01/10/2019  PT End of Session - 01/10/19 1518    Visit Number  16    Date for PT Re-Evaluation  02/21/19    Authorization Type  [employer]    PT Start Time  1447    PT Stop Time  1525    PT Time Calculation (min)  38 min    Activity Tolerance  Patient tolerated treatment well;No increased pain       Past Medical History:  Diagnosis Date  . Cysts    liver  . Diabetes mellitus ([place])   . Gallstones    asymptomatic  . Hyperlipidemia   . Obesity   . Prostatitis    requiring a trip to the ER in [place] with a fever - 09    Past Surgical History:  Procedure Laterality Date  . HERNIA REPAIR     Childhood    There were no vitals filed for this visit.  Subjective Assessment - 01/10/19 1449    Subjective  Some days I'm seeing some improvements, like with holding my head up.  It doesn't hurt to hold my head up know.  I see the neurologist next week.         [facility] PT Assessment - 01/10/19 0001      Observation/Other Assessments   Observations  stand against the wall and measure from the wall to crown of head is 26 cm      6 minute walk test results    Endurance additional comments  2 min without cues to hold head up       Standardized Balance Assessment   Standardized Balance Assessment  Five Times Sit to Stand    Five times sit to stand comments   15      Dynamic Gait Index   Level Surface  Mild Impairment    Change in Gait Speed  Mild Impairment    Gait with Horizontal Head Turns  Mild Impairment    Gait with Vertical Head Turns  Mild Impairment    Gait and Pivot Turn  Mild Impairment    Step Over  Obstacle  Mild Impairment    Step Around Obstacles  Mild Impairment    Steps  Mild Impairment    Total Score  16                   [facility] Adult PT Treatment/Exercise - 01/10/19 0001      Therapeutic Activites    Therapeutic Activities  ADL's    ADL's   sit to stand, standing, walking, stair climbing      Neck Exercises: Seated   Neck Retraction  10 reps;3 secs    Neck Retraction Limitations  press back of head into ball     Other Seated Exercise  thoracic extension over blue ball 10x     Other Seated Exercise  pt holding ends of yellow band, pushing head into resistance       Knee/Hip Exercises: Aerobic   Nustep  L3 x5 min PT present to encourage upright posture                PT Short Term Goals - 12/20/18 1614  PT SHORT TERM GOAL #1   Title  independent with initial HEP    Time  4    Period  Weeks    Status  Partially Met    Target Date  12/13/18      PT SHORT TERM GOAL #2   Title  stand with his head </= 23 cm due to increased trunk strength and improved posture    Baseline  20 cm    Time  4    Period  Weeks    Status  Achieved    Target Date  12/13/18      PT SHORT TERM GOAL #4   Title  Dynamic Gait index >/= 18 due to improved gait and strength    Baseline  15    Time  4    Period  Weeks    Status  On-going    Target Date  12/13/18        PT Long Term Goals - 01/10/19 1500      PT LONG TERM GOAL #1   Title  Pt will be ind with advanced HEP    Time  8    Period  Weeks    Status  On-going    Target Date  02/21/19      PT LONG TERM GOAL #2   Title  stand with his head from the wall </= 22 cm due to increased trunk strength and posture    Status  Revised      PT LONG TERM GOAL #3   Title  able to walk for 4 minutes without verbal cues to keep his head up due to increased strength    Time  8    Period  Weeks    Status  Revised      PT LONG TERM GOAL #4   Title  Dynamic Gait Index >/= 20 due to improved balance    Baseline   16 on 01/10/19    Time  8    Period  Weeks    Status  On-going            Plan - 01/10/19 2018    Clinical Impression Statement  The patient is requires moderate cues to hold head in upright position between tasks or when he becomes distracted; however with repetitive pre-task instruction he is able to look forward while walking for 2 minutes.  His Dynamic Gait Index score has improved to 16/24 with slowed gait speed with tasks but no loss of balance.   His standing postural alignment is unchanged in the last few weeks.  Although his progress, has been slower than anticipated, he would benefit from additional PT at a reduced treatment frequency of 1x/week in order to promote independence with a progressive HEP for postural strengthening.  It is important to continue with strengthening in order to prevent a functional decline and reduce risk of falls.    Rehab Potential  Excellent    PT Frequency  1x / week    PT Duration  6 weeks    PT Treatment/Interventions  Gait training;Stair training;Functional mobility training;Therapeutic activities;Therapeutic exercise;Balance training;Neuromuscular re-education;Patient/family education;Manual techniques    PT Next Visit Plan  KX;  work on balance, work on HEP independence;  do  BERG balance test;  continue at reduced frequency of 1x/week    PT Home Exercise Plan  Access Code: [email or web address]       Patient will benefit from skilled therapeutic intervention in order to improve the following deficits  and impairments:  Abnormal gait, Difficulty walking, Decreased endurance, Decreased balance, Decreased strength, Decreased mobility  Visit Diagnosis: Other abnormalities of gait and mobility - Plan: PT plan of care cert/re-cert  Abnormal posture - Plan: PT plan of care cert/re-cert  Muscle weakness (generalized) - Plan: PT plan of care cert/re-cert     Problem List Patient Active Problem List   Diagnosis Date Noted  . Hearing loss of right ear  01/23/2017  . Mild cognitive impairment 09/13/2016  . Type 2 diabetes mellitus without complication, without long-term current use of insulin ([place]) 03/30/2015  . Short-term memory loss 02/03/2015  . H/O hyperthyroidism 01/28/2013  . ERECTILE DYSFUNCTION, MILD 02/19/2008  . Allergic rhinitis 02/19/2008  . Hyperlipidemia 02/15/2007  . HEPATIC CYST 02/15/2007   Ruben Im, PT 01/10/19 8:31 PM Phone: [phone] Fax: [phone] Alvera Singh 01/10/2019, 8:31 PM  [facility] [address] [place], [place], [address] Phone: [phone]   Fax:  [phone]  Name: Erik Lara MRN: [id number] Date of Birth: 05-02-1935

## 2019-01-11 NOTE — Telephone Encounter (Signed)
Denied.  Filled on 01/01/2019

## 2019-01-23 DIAGNOSIS — L814 Other melanin hyperpigmentation: Secondary | ICD-10-CM | POA: Diagnosis not present

## 2019-01-23 DIAGNOSIS — L570 Actinic keratosis: Secondary | ICD-10-CM | POA: Diagnosis not present

## 2019-01-23 DIAGNOSIS — L718 Other rosacea: Secondary | ICD-10-CM | POA: Diagnosis not present

## 2019-01-24 ENCOUNTER — Encounter: Payer: Self-pay | Admitting: Physical Therapy

## 2019-01-24 ENCOUNTER — Other Ambulatory Visit: Payer: Self-pay

## 2019-01-24 ENCOUNTER — Ambulatory Visit: Payer: PPO | Attending: Neurology | Admitting: Physical Therapy

## 2019-01-24 DIAGNOSIS — R293 Abnormal posture: Secondary | ICD-10-CM | POA: Insufficient documentation

## 2019-01-24 DIAGNOSIS — M6281 Muscle weakness (generalized): Secondary | ICD-10-CM

## 2019-01-24 DIAGNOSIS — R2689 Other abnormalities of gait and mobility: Secondary | ICD-10-CM | POA: Diagnosis not present

## 2019-01-24 NOTE — Therapy (Signed)
[facility] [address], [place], [place], [address] Phone: [phone]   Fax:  [phone]  Physical Therapy Treatment  Patient Details  Name: Erik Lara MRN: [id number] Date of Birth: [birth year] Referring Provider (PT): Dr. Ellouise Newer   Encounter Date: 01/24/2019  PT End of Session - 01/24/19 1610    Visit Number  17    Date for PT Re-Evaluation  02/21/19    Authorization Type  [employer]    PT Start Time  1530    PT Stop Time  1612    PT Time Calculation (min)  42 min    Activity Tolerance  Patient tolerated treatment well;No increased pain    Behavior During Therapy  WFL for tasks assessed/performed       Past Medical History:  Diagnosis Date  . Cysts    liver  . Diabetes mellitus ([place])   . Gallstones    asymptomatic  . Hyperlipidemia   . Obesity   . Prostatitis    requiring a trip to the ER in [place] with a fever - 09    Past Surgical History:  Procedure Laterality Date  . HERNIA REPAIR     Childhood    There were no vitals filed for this visit.  Subjective Assessment - 01/24/19 1533    Subjective  Pt states that things are going well. No complaints at this time.    Currently in Pain?  No/denies         [facility] PT Assessment - 01/24/19 0001      Standardized Balance Assessment   Standardized Balance Assessment  Berg Balance Test      Berg Balance Test   Sit to Stand  Able to stand without using hands and stabilize independently    Standing Unsupported  Able to stand safely 2 minutes    Sitting with Back Unsupported but Feet Supported on Floor or Stool  Able to sit safely and securely 2 minutes    Stand to Sit  Sits safely with minimal use of hands    Transfers  Able to transfer safely, minor use of hands    Standing Unsupported with Eyes Closed  Able to stand 10 seconds with supervision   pt leaning forward as time progressed   Standing Unsupported with Feet  Together  Able to place feet together independently and stand 1 minute safely    From Standing, Reach Forward with Outstretched Arm  Can reach forward >12 cm safely (5")    From Standing Position, Pick up Object from Floor  Able to pick up shoe safely and easily    From Standing Position, Turn to Look Behind Over each Shoulder  Looks behind from both sides and weight shifts well    Turn 360 Degrees  Able to turn 360 degrees safely but slowly   10+ sec   Standing Unsupported, Alternately Place Feet on Step/Stool  Able to stand independently and complete 8 steps >20 seconds   22 sec   Standing Unsupported, One Foot in Front  Needs help to step but can hold 15 seconds    Standing on One Leg  Tries to lift leg/unable to hold 3 seconds but remains standing independently    Total Score  45                   [facility] Adult PT Treatment/Exercise - 01/24/19 0001      Neck Exercises: Seated   Other Seated Exercise  B horizontal abduction with blue TB x15 reps       Knee/Hip Exercises: Stretches   Sports administrator  Both;2 reps;20 seconds    Quad Stretch Limitations  standing with LE in chair, PT assisting pt's leg into the chair       Seated thoracic extension x10 reps      Balance Exercises - 01/24/19 1607      Balance Exercises: Standing   Standing, One Foot on a Step  Eyes open;6 inch;5 reps   5 sec hold, 1 UE support    Turning  Right;Left;10 reps   holding ball 1 step turn each direction        PT Education - 01/24/19 1610    Education Details  updated TB with HEP    Person(s) Educated  Patient    Methods  Explanation    Comprehension  Verbalized understanding       PT Short Term Goals - 12/20/18 1614      PT SHORT TERM GOAL #1   Title  independent with initial HEP    Time  4    Period  Weeks    Status  Partially Met    Target Date  12/13/18      PT SHORT TERM GOAL #2   Title  stand with his head </= 23 cm due to increased trunk strength and improved posture     Baseline  20 cm    Time  4    Period  Weeks    Status  Achieved    Target Date  12/13/18      PT SHORT TERM GOAL #4   Title  Dynamic Gait index >/= 18 due to improved gait and strength    Baseline  15    Time  4    Period  Weeks    Status  On-going    Target Date  12/13/18        PT Long Term Goals - 01/10/19 1500      PT LONG TERM GOAL #1   Title  Pt will be ind with advanced HEP    Time  8    Period  Weeks    Status  On-going    Target Date  02/21/19      PT LONG TERM GOAL #2   Title  stand with his head from the wall </= 22 cm due to increased trunk strength and posture    Status  Revised      PT LONG TERM GOAL #3   Title  able to walk for 4 minutes without verbal cues to keep his head up due to increased strength    Time  8    Period  Weeks    Status  Revised      PT LONG TERM GOAL #4   Title  Dynamic Gait Index >/= 20 due to improved balance    Baseline  16 on 01/10/19    Time  8    Period  Weeks    Status  On-going            Plan - 01/24/19 1611    Clinical Impression Statement  Pt has been fairly compliant with his HEP over the past couple of weeks. He participated in the BERG balance test, scoring 45/56. This places him in a significant risk for falls category. Pt had the most difficulty with single leg tasks and direction changes, requiring close PT supervision. Pt was able to complete step  with turn successfully but required close supervision and verbal cuing with this. Will continue with 1x/week to promote HEP independence and address significant balance restrictions.    Rehab Potential  Excellent    PT Frequency  1x / week    PT Duration  6 weeks    PT Treatment/Interventions  Gait training;Stair training;Functional mobility training;Therapeutic activities;Therapeutic exercise;Balance training;Neuromuscular re-education;Patient/family education;Manual techniques    PT Next Visit Plan  KX;  work on balance, work on [employer] independence; continue at  reduced frequency of 1x/week    PT Home Exercise Plan  Access Code: [facility]       Patient will benefit from skilled therapeutic intervention in order to improve the following deficits and impairments:  Abnormal gait, Difficulty walking, Decreased endurance, Decreased balance, Decreased strength, Decreased mobility  Visit Diagnosis: Other abnormalities of gait and mobility  Abnormal posture  Muscle weakness (generalized)     Problem List Patient Active Problem List   Diagnosis Date Noted  . Hearing loss of right ear 01/23/2017  . Mild cognitive impairment 09/13/2016  . Type 2 diabetes mellitus without complication, without long-term current use of insulin ([place]) 03/30/2015  . Short-term memory loss 02/03/2015  . H/O hyperthyroidism 01/28/2013  . ERECTILE DYSFUNCTION, MILD 02/19/2008  . Allergic rhinitis 02/19/2008  . Hyperlipidemia 02/15/2007  . HEPATIC CYST 02/15/2007    4:16 PM,01/24/19 Sherol Dade PT, DPT [place] [address], [place], [place], [address] Phone: [phone]   Fax:  [phone]  Name: Erik Lara MRN: [id number] Date of Birth: May 26, 1935

## 2019-01-25 ENCOUNTER — Encounter

## 2019-01-25 DIAGNOSIS — E119 Type 2 diabetes mellitus without complications: Secondary | ICD-10-CM | POA: Diagnosis not present

## 2019-01-30 ENCOUNTER — Ambulatory Visit: Payer: PPO

## 2019-01-30 ENCOUNTER — Encounter: Payer: Self-pay | Admitting: Psychology

## 2019-01-30 ENCOUNTER — Other Ambulatory Visit: Payer: Self-pay

## 2019-01-30 ENCOUNTER — Ambulatory Visit: Payer: PPO | Admitting: Psychology

## 2019-01-30 DIAGNOSIS — G3184 Mild cognitive impairment, so stated: Secondary | ICD-10-CM

## 2019-01-30 NOTE — Progress Notes (Signed)
   Neuropsychology Note   Erik Lara completed 125 minutes of neuropsychological testing with technician, Cruzita Lederer, B.S., under the supervision of Dr. Christia Reading, Ph.D., licensed neuropsychologist. The patient did not appear overtly distressed by the testing session, per behavioral observation or via self-report to the technician. Rest breaks were offered.    In considering the patient's current level of functioning, level of presumed impairment, nature of symptoms, emotional and behavioral responses during the interview, level of literacy, and observed level of motivation/effort, a battery of tests was selected and communicated to the psychometrician.   Communication between the psychologist and technician was ongoing throughout the testing session and changes were made as deemed necessary based on patient performance on testing, technician observations and additional pertinent factors such as those listed above.   Erik Lara will return within approximately two weeks for an interactive feedback session with Dr. Melvyn Novas at which time his test performances, clinical impressions, and treatment recommendations will be reviewed in detail. The patient understands he can contact our office should he require our assistance before this time.   Full report to follow.  125 minutes were spent face-to-face with patient administering standardized tests and 15 minutes were spent scoring (technician). [CPT [id number], [id number]

## 2019-01-30 NOTE — Progress Notes (Signed)
NEUROPSYCHOLOGICAL EVALUATION [place]. [facility] Department of Neurology  Reason for Referral:   Erik Lara is a 83 y.o. Caucasian male referred by Ellouise Newer, M.D., to characterize his current cognitive functioning and assist with diagnostic clarity and treatment planning in the context of a history of a mild neurocognitive disorder and concerns regarding continued cognitive decline.  Assessment and Plan:   Clinical Impression(s): Erik Lara's pattern of performance is suggestive of mild to moderate frontal-subcortical dysfunction, evidenced by primary deficits in processing speed, as well as additional weaknesses across encoding and retrieval aspects of verbal memory, working memory, verbal fluency, and some aspects of cognitive flexibility. Performance was within normal limits across basic attention, verbal abstraction, receptive language, confrontation naming, visuospatial abilities, visual learning and memory, and consolidation aspects of verbal memory.  Relative to his prior 2 evaluations, Erik Lara exhibited a notable decline across processing speed. Additional more mild declines were seen across verbal learning and memory, verbal abstraction, and spatial manipulation (although the latter was likely impacted by deficits in processing speed). Other domains, including those representing weaknesses across the current evaluation (e.g., cognitive flexibility and verbal fluency) were stable. Overall, given evidence for cognitive dysfunction, coupled with his report of intact ability to complete activities of daily living (ADLs), Erik Lara continues to meet criteria for a Mild Neurocognitive Disorder (formerly "mild cognitive impairment") at the present time. The etiology of these deficits remains unclear. I share Dr. Marcia Brash surprise that Erik Lara does not display more advanced small vessel ischemic disease given that his cognitive profile is consistent with  what would be expected in a vascular neurocognitive disorder. Given intact visual learning and memory, coupled with the relative stability of much of Erik Lara's cognitive profile, Alzheimer's disease appears unlikely at the present time.  Recommendations: A repeat neuropsychological evaluation in 12-18 months (or sooner if functional decline is noted) is recommended to assess the trajectory of future cognitive decline should it occur. This will also aid in future efforts towards improved diagnostic clarity.  Erik Lara is encouraged to attend to lifestyle factors for brain health (e.g., regular physical exercise, good nutrition habits, regular participation in cognitively-stimulating activities, and general stress management techniques), which are likely to have benefits for both emotional adjustment and cognition. In fact, in addition to promoting good general health, regular exercise incorporating aerobic activities (e.g., brisk walking, jogging, cycling, etc.) has been demonstrated to be a very effective treatment for depression and stress, with similar efficacy rates to both antidepressant medication and psychotherapy.  Continued participation in activities which provide mental stimulation and social interaction is also recommended.   If interested, there are some activities which have therapeutic value and can be useful in keeping you cognitively stimulated. For suggestions, Erik Lara. Ta is encouraged to go to the following website: https://www.[facility].org/get-to-know-barrow/centers-programs/neurorehabilitation-center/neuro-rehab-apps-and-games/ which has options, categorized by level of difficulty. It should be noted that these activities should not be viewed as a substitute for therapy.  When learning new information, he would benefit from information being broken up into small, manageable pieces. He may also find it helpful to articulate the material in his own words and in a context to  promote encoding at the onset of a new task. This material may need to be repeated multiple times to promote encoding.  To address problems with processing speed, he may wish to consider:   -Ensuring that he is alerted when essential material or instructions are being presented   -Adjusting the speed at which new information  is presented   -Allowing additional processing time or a chance to rehearse novel information   -Allowing for more time in comprehending, processing, and responding in conversation   -Repeating and paraphrasing instructions or conversations aloud  Review of Records:   Erik Lara completed a comprehensive neuropsychological evaluation Kandis Nab, Psy.D.) on 05/24/2016. Results of this evaluation suggested mild frontal-subcortical dysfunction, evidenced by difficulties with verbal fluency and cognitive flexibility. He was ultimately diagnosed with non-amnestic mild cognitive impairment. He was evaluated again on 08/31/2017. Results were said to be largely stable, with some observed improvements. Verbal fluency and receptive language represented relative weaknesses. Results were said to support his previous diagnosis of a non-amnestic mild cognitive impairment.   More recently, Erik Lara was seen by [facility] Ellouise Newer, M.D.) on 11/09/2018 for follow-up of worsening memory. During this appointment, he described variable difficulties with short-term memory and generalized forgetfulness. ADLs were largely described as intact. Performance on a brief cognitive screening instrument [facility]) was abnormal (19/30). Points were lost across the following domains: visuospatial/executive (4/5), digit repetition (1/2), serial 7s (1/3), sentence repetition (0/2), verbal fluency (0/1), verbal abstraction (1/2), and delayed recall (2/5). Ultimately, Erik Lara was referred for a repeat evaluation to assess the trajectory of cognitive decline.  Brain MRI on 04/14/2015 revealed  moderate cerebral atrophy and minimal chronic small vessel ischemic disease. Head CT on 09/18/2017 revealed mild chronic microvascular ischemic changes and parenchymal volume loss.  Past Medical History:  Diagnosis Date   Gallstones    asymptomatic   Hearing loss of right ear 01/23/2017   Hepatic Cysts    Hyperlipidemia    Hyperthyroidism 01/28/2013   Mild neurocognitive disorder 09/13/2016   Prostatitis    requiring a trip to the ER in [place] with a fever - 09   Type 2 diabetes mellitus without complication, without long-term current use of insulin ([place]) 03/30/2015    Past Surgical History:  Procedure Laterality Date   HERNIA REPAIR     Childhood    Family History  Problem Relation Age of Onset   Cancer Other        breast   Diabetes Other      Current Outpatient Medications:    aspirin 81 MG tablet, Take 81 mg by mouth daily., Disp: , Rfl:    cetirizine (ZYRTEC) 10 MG tablet, Take 10 mg by mouth daily., Disp: , Rfl:    donepezil (ARICEPT) 10 MG tablet, Take 1 tablet (10 mg total) by mouth at bedtime., Disp: 90 tablet, Rfl: 3   FREESTYLE LITE test strip, Use to test blood glucose once daily, Disp: 100 each, Rfl: 3   Lancets (FREESTYLE) lancets, USE ONCE DAILY, Disp: 100 each, Rfl: 3   metFORMIN (GLUCOPHAGE) 500 MG tablet, TAKE ONE-HALF TABLET BY MOUTH EVERY MORNING, Disp: 45 tablet, Rfl: 1   simvastatin (ZOCOR) 20 MG tablet, TAKE ONE TABLET BY MOUTH DAILY AT BEDTIME, Disp: 90 tablet, Rfl: 3  Clinical Interview:   Cognitive Symptoms: Decreased short-term memory: Endorsed. Provided examples were largely generalized in nature. However, he did specify misplacing items around the home and trouble remembering the details of previous conversations. Difficulties were said to be variably present rather than occur all the time. He expressed the potential that they have worsened since his previous evaluation, but also stated that this may be due to him being more  sensitive towards perceived deficits. Decreased long-term memory: Denied. Decreased attention/concentration: Endorsed. Difficulties with maintaining his focus, ease of distractibility, and losing his train of  thought were endorsed. These were described as largely stable. While never diagnosed with ADHD, he reported that these symptoms have been present since childhood. However, this was not diagnosed during previous neuropsychological evaluations.  Reduced processing speed: Endorsed. Difficulties with executive functions: Largely denied. He acknowledged occasional difficulties with organization. Trouble with indecisiveness, impulsivity, or using poor judgment were denied. Difficulties with emotion regulation: Denied. Difficulties with receptive language: Endorsed. However, these were generally attributed to mild hearing loss. Difficulties with word finding: Endorsed. Decreased visuoperceptual ability: Denied.  Difficulties completing ADLs: Denied. Erik Lara. Sh[place] continues to drive without issue; however, he has limited his night-time driving due to visual acuity concerns.   Additional Medical History: History of traumatic brain injury/concussion: Endorsed. In early 2020, Erik Lara. Zarza reported falling down a flight of 32 steps, leading to a large gash on his forehead and a brief hospitalization. He reported no injuries stemming from this fall. Neuroimaging was said to be unremarkable, and he was discharged after approximately 8 hours. Persisting post-concussion symptoms were denied.  History of stroke: Denied. History of seizure activity: Denied. History of known exposure to toxins: Denied. Symptoms of chronic pain: Endorsed. Shoulder pain was attributed to his recent fall. However, he has been working with rehabilitation experts on treating this injury, which has been beneficial.  Experience of frequent headaches/migraines: Denied. Frequent instances of dizziness/vertigo: Denied.  Sensory changes:  He reported declines in his left eye and recently received an updated glasses prescription. He also reported mild hearing loss, as well as a diminished sense of smell.  Balance/coordination difficulties: Endorsed. He reported symptoms of balance instability, as well as a history of 3 falls within the past 12 months. Outside what is described above, he provided detail for one other event, where he was walking down a slope in a local park and could not slow himself down, leading to his eventual fall. He denied injuries stemming from this event.  Other motor difficulties: Denied.  Sleep History: Estimated hours obtained each night: 6.5 hours. Difficulties falling asleep: Denied. Difficulties staying asleep: Denied. Feels rested and refreshed upon awakening: Endorsed.  History of snoring: Denied. History of waking up gasping for air: Denied. Witnessed breath cessation while asleep: Denied.  History of vivid dreaming: Very rarely. Excessive movement while asleep: Denied. Instances of acting out his dreams: He alluded to very rare instances of "striking out" while asleep, generally related to dream content. However, this has not occurred within the past 6 months.   Psychiatric/Behavioral Health History: Depression: Denied. Erik Lara. Mashak denied a history of mental health concerns or prior diagnoses. Acutely, he described his mood as "pretty good," but did acknowledge room for improvement. The latter was related to ongoing family-related stressors, as well as the ongoing COVID-19 pandemic and election cycle. Current or remote suicidal ideation, intent, or plan was denied.  Anxiety: Denied. Mania: Denied. Trauma History: Denied. Visual/auditory hallucinations: Denied. Delusional thoughts: Denied. Mental health treatment: Denied.  Tobacco: Denied. Alcohol: He reported occasionally consuming 1 beer with dinner. A history of problematic alcohol use, abuse, or dependence was denied.  Recreational  drugs: Denied. Caffeine: 2 cups of coffee in the morning.  Academic/Vocational History: Highest level of educational attainment: 18 years. He earned a Dietitian in Cabin crew, as well as an Loss adjuster, chartered from [employer]. He described himself as a good (A/B) student in academic settings.  History of developmental delay: Denied. History of grade repetition: Denied. Enrollment in special education courses: Denied. History of diagnosed specific learning disability: Denied. History of ADHD:  Denied.  Employment: Retired. He served in the [employer] for 6 years. 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   Evaluation Results:   Behavioral Observations: Erik Lara was unaccompanied, arrived to his appointment on time, and was appropriately dressed and groomed. Observed gait and station were notable slowed, but frank balance instability was not witnessed. He also ambulated with a stooped, widened posture. Gross motor functioning appeared intact upon informal observation and no abnormal movements (e.g., tremors) were noted. His affect was generally relaxed and positive, but did range appropriately given the subject being discussed during the clinical interview or the task at hand during testing procedures. Spontaneous speech was fluent and word finding difficulties were not observed during the clinical interview or testing procedures. Sustained attention was appropriate throughout. Thought processes were coherent, organized, and normal in content. Task engagement was adequate and he persisted when challenged. Overall, Erik Lara was cooperative with the clinical interview and subsequent testing procedures. One task (D-KEFS Color-Word Interference) was discontinued as he had trouble distinguishing between blue and green.  Adequacy of Effort: The validity of neuropsychological testing is limited by the extent to which the individual being  tested may be assumed to have exerted adequate effort during testing. Erik Lara expressed his intention to perform to the best of his abilities and exhibited adequate task engagement and persistence. Scores across stand-alone and embedded performance validity measures were within expectation. As such, the results of the current evaluation are believed to be a valid representation of Erik Lara's current cognitive functioning.  Test Results: Erik Lara was mildly disoriented at the time of the current evaluation. He stated his incorrect age (13; 1/2), as well as the incorrect year (2000; 0/2).  Intellectual abilities based upon educational and vocational attainment were estimated to be in the average range. Premorbid abilities were estimated to be within the average range based upon a single-word reading test.   Processing speed was exceptionally low to well below average and represented a primary weakness across the current evaluation. Basic attention was average. More complex attention (e.g., working memory) was below average. Assessed executive functioning (cognitive flexibility) was variable and likely impacted by deficits in processing speed.  Assessed receptive language abilities were within normal limits. Likewise, Erik Lara did not exhibit any difficulties comprehending task instructions and answered all questions asked of him appropriately. Assessed expressive language (e.g., verbal fluency and confrontation naming) was variable. Confrontation naming was within normal limits, while verbal fluency was well below average.     Assessed visuospatial/visuoconstructional abilities were below average overall.    Learning (i.e., encoding) of novel verbal and visual information was variable, ranging from the well below average to average normative ranges. Spontaneous delayed recall (i.e., retrieval) of previously learned information was variable, with a noted weakness across verbal retrieval  relative to visual. Performance across recognition tasks was variable, but generally appropriate, suggesting evidence for information consolidation. A weakness was noted across a list learning task.   Results of emotional screening instruments suggested that recent symptoms of generalized anxiety were in the minimal range, while symptoms of depression were within normal limits. A screening instrument assessing recent sleep quality suggested the presence of minimal sleep dysfunction.  Tables of Scores:   Note: This summary of test scores accompanies the interpretive report and should not be considered in isolation without reference to the appropriate sections in the text. Descriptors are based on appropriate normative data and may be adjusted  based on clinical judgment. The terms impaired and within normal limits (WNL) are used when a more specific level of functioning cannot be determined. Descriptors refer to the current evaluation only.         Effort Testing:     DESCRIPTOR   March 2018 June 2019 Current    ACS Word Choice: --- --- --- --- Within Expectation    *Based on 83 y/o norms       Dot Counting Test: --- --- --- --- Within Expectation  CVLT-III Forced Choice Recognition: --- --- --- --- Within Expectation         Orientation:        Raw Score Raw Score Raw Score Percentile   NAB Orientation, Form 1 --- --- 26/29 --- ---         Intellectual Functioning:               Standard Score Standard Score Standard Score Percentile   Test of Premorbid Functioning: 94 --- 92 30 Average         Memory:              Wechsler Memory Scale (WMS-IV):                       Raw Score (Scaled Score) Raw Score (Scaled Score) Raw Score (Scaled Score) Percentile     Logical Memory I 20/53 (7) 22/53 (8) 15/53 (6) 9 Below Average    Logical Memory II 15/39 (10) 10/39 (9) 6/39 (7) 16 Below Average    Logical Memory Recognition 19/23 18/23 15/23  17-25 Below Average         [place] Verbal  Learning Test (CVLT-III) Brief Form: Raw Score Raw Score Raw Score (Scaled/Standard Score) Percentile     Total Trials 1-4 18/36 19/36 16/36  (73) 4 Well Below Average    Short-Delay Free Recall 5/9 5/9 2/9 (1) <1 Exceptionally Low    Long-Delay Free Recall 5/9 5/9 1/9 (3) 1 Exceptionally Low    Long-Delay Cued Recall 5/9 5/9 4/9 (4) 2 Well Below Average      Recognition Hits 7/9 9/9 9/9 (13) 84 Above Average      False Positive Errors 1 0 4 (4) 2 Well Below Average          Raw Score Raw Score Raw Score (Scaled Score) Percentile   RBANS Figure Copy: 17/20 20/20 18/20  (11) 63 Average  RBANS Figure Recall: 15/20 11/20 12/20  (10) 50 Average    Figure Recognition --- --- 7/8 69-83 Average         Attention/Executive Function:              Trail Making Test (TMT): Raw Score Raw Score Raw Score (T Score) Percentile     Part A 56 secs.,  0 errors 66 secs.,  1 error 86 secs.,  0 errors (23) <1 Exceptionally Low    Part B 295 secs.,  4 errors 227 secs.,  2 errors DC'D @ 300,  0 errors --- Impaired          Scaled Score Scaled Score Scaled Score Percentile   WAIS-IV Coding: 9 9 6 9  Below Average          Scaled Score Scaled Score Scaled Score Percentile   WAIS-IV Digit Span: --- --- 5 5 Well Below Average    Forward 9 8 8 25  Average    Backward 8 6 7 16  Below Average    Sequencing --- ---  3 1 Exceptionally Low          Scaled Score Scaled Score Scaled Score Percentile   WAIS-IV Similarities: 10 12 8 25  Average         D-KEFS Color-Word Interference Test: Raw Score Raw Score Raw Score (Scaled Score) Percentile     Color Naming --- --- 68 secs. (1) <1 Exceptionally Low    Word Reading --- --- 57 secs. (1) <1 Exceptionally Low    Inhibition --- --- Discontinued --- ---    Inhibition/Switching --- --- --- --- ---         D-KEFS Verbal Fluency Test: Raw Score Raw Score Raw Score (Scaled Score) Percentile     Letter Total Correct --- --- 16 (5) 5 Well Below Average    Category Total  Correct --- --- 16 (4) 2 Well Below Average    Category Switching Total Correct --- --- 8 (7) 16 Below Average    Category Switching Accuracy --- --- 7 (8) 25 Average      Total Set Loss Errors --- --- 1 (11) 63 Average      Total Repetition Errors --- --- 3 (10) 50 Average         Language:              Verbal Fluency Test: Raw Score Raw Score Raw Score (Z-Score) Percentile     Phonemic Fluency (FAS) 13 16 16  (-1.88) 4 Well Below Average    Animal Fluency 10 6 9  (-1.7) 5 Well Below Average  *Based on [employer] Older Normative Studies (MOANS)              NAB Language Module, Form 2: T Score T Score T Score Percentile     Auditory Comprehension --- --- 57 75 Above Average    Naming 31/31 (59) 31/31 (63) 29/31 (52) 58 Average         Visuospatial/Visuoconstruction:        Raw Score Raw Score Raw Score Percentile   Clock Drawing: WNL WNL 8/10 --- Within Normal Limits         NAB Spatial Module, Form 2: T Score T Score T Score Percentile     Visual Discrimination --- --- 35 7 Well Below Average          Scaled Score Scaled Score Scaled Score Percentile   WAIS-IV Block Design: 10 13 7 16  Below Average         Mood and Personality:        Raw Score Raw Score Raw Score Percentile   Geriatric Depression Scale: --- --- 8 --- Within Normal Limits  Geriatric Anxiety Scale: --- --- 8 --- Minimal    Somatic --- --- 3 --- Minimal    Cognitive --- --- 3 --- Mild    Affective --- --- 2 --- Minimal         Additional Questionnaires:        Raw Score Raw Score Raw Score Percentile   PROMIS Sleep Disturbance Questionnaire: --- --- 14 --- None to Slight   Informed Consent and Coding/Compliance:   Erik Lara. Riccelli was provided with a verbal description of the nature and purpose of the present neuropsychological evaluation. Also reviewed were the foreseeable risks and/or discomforts and benefits of the procedure, limits of confidentiality, and mandatory reporting requirements of this provider. The  patient was given the opportunity to ask questions and receive answers about the evaluation. Oral consent to participate was provided by the patient.   This  evaluation was conducted by Christia Reading, Ph.D., licensed clinical neuropsychologist. Erik Lara. Schriever completed a 35-minute clinical interview, billed as one unit [phone], and 140 minutes of cognitive testing, billed as one unit [phone] and four additional units [phone]. Psychometrist Cruzita Lederer, B.S., assisted Dr. Melvyn Novas with test administration and scoring procedures. As a separate and discrete service, Dr. Melvyn Novas spent a total of 180 minutes in interpretation and report writing, billed as one unit 96132 and two units 96133.

## 2019-01-31 ENCOUNTER — Ambulatory Visit: Payer: PPO | Admitting: Physical Therapy

## 2019-01-31 ENCOUNTER — Encounter: Payer: Self-pay | Admitting: Physical Therapy

## 2019-01-31 DIAGNOSIS — R2689 Other abnormalities of gait and mobility: Secondary | ICD-10-CM | POA: Diagnosis not present

## 2019-01-31 DIAGNOSIS — M6281 Muscle weakness (generalized): Secondary | ICD-10-CM

## 2019-01-31 DIAGNOSIS — R293 Abnormal posture: Secondary | ICD-10-CM

## 2019-01-31 NOTE — Therapy (Signed)
[facility] [address] [place], [place], [address] Phone: [phone]   Fax:  [phone]  Physical Therapy Treatment  Patient Details  Name: Erik Lara MRN: [id number] Date of Birth: 03-08-1936 Referring Provider (PT): Dr. Ellouise Newer   Encounter Date: 01/31/2019  PT End of Session - 01/31/19 1453    Visit Number  18    Date for PT Re-Evaluation  02/21/19    Authorization Type  [employer]    PT Start Time  7858    PT Stop Time  1525    PT Time Calculation (min)  38 min    Activity Tolerance  Patient tolerated treatment well;No increased pain    Behavior During Therapy  WFL for tasks assessed/performed       Past Medical History:  Diagnosis Date  . Gallstones    asymptomatic  . Hearing loss of right ear 01/23/2017  . Hepatic Cysts   . Hyperlipidemia   . Hyperthyroidism 01/28/2013  . Mild neurocognitive disorder 09/13/2016  . Prostatitis    requiring a trip to the ER in [place] with a fever - 09  . Type 2 diabetes mellitus without complication, without long-term current use of insulin ([place]) 03/30/2015    Past Surgical History:  Procedure Laterality Date  . HERNIA REPAIR     Childhood    There were no vitals filed for this visit.  Subjective Assessment - 01/31/19 1452    Subjective  Pt states that he has had alot of family issues lately which has prevented him from completing his HEP. He has no complaints at this time.    Currently in Pain?  No/denies                       [facility] Adult PT Treatment/Exercise - 01/31/19 0001      Neck Exercises: Standing   Other Standing Exercises  thoracic extension with cervical retraction with back against wall 5x10 sec hold       Neck Exercises: Seated   Neck Retraction  10 reps    Neck Retraction Limitations  ball against neck, PT verbal cuing     Other Seated Exercise  thoracic extension with therapist overpressure stretch  (pool noodle) 4x5 reps at mid thoracic spine       Knee/Hip Exercises: Aerobic   Nustep  L3 x6 min, PT present to discuss importance of HEP adherence      Neck Exercises: Stretches   Chest Stretch  2 reps;20 seconds   doorway         Balance Exercises - 01/31/19 1503      Balance Exercises: Standing   Cone Rotation Limitations  step across with trunk rotation holding large beachball x10 reps each direction     Other Standing Exercises  NBOS on firm and foam surface with wall slide BUE flexion/thoracic extension x8 reps each         PT Education - 01/31/19 1454    Education Details  technique with therex; importance of HEP adherence    Person(s) Educated  Patient    Methods  Explanation;Verbal cues    Comprehension  Verbalized understanding;Verbal cues required       PT Short Term Goals - 12/20/18 1614      PT SHORT TERM GOAL #1   Title  independent with initial HEP    Time  4    Period  Weeks    Status  Partially Met  Target Date  12/13/18      PT SHORT TERM GOAL #2   Title  stand with his head </= 23 cm due to increased trunk strength and improved posture    Baseline  20 cm    Time  4    Period  Weeks    Status  Achieved    Target Date  12/13/18      PT SHORT TERM GOAL #4   Title  Dynamic Gait index >/= 18 due to improved gait and strength    Baseline  15    Time  4    Period  Weeks    Status  On-going    Target Date  12/13/18        PT Long Term Goals - 01/10/19 1500      PT LONG TERM GOAL #1   Title  Pt will be ind with advanced HEP    Time  8    Period  Weeks    Status  On-going    Target Date  02/21/19      PT LONG TERM GOAL #2   Title  stand with his head from the wall </= 22 cm due to increased trunk strength and posture    Status  Revised      PT LONG TERM GOAL #3   Title  able to walk for 4 minutes without verbal cues to keep his head up due to increased strength    Time  8    Period  Weeks    Status  Revised      PT LONG TERM  GOAL #4   Title  Dynamic Gait Index >/= 20 due to improved balance    Baseline  16 on 01/10/19    Time  8    Period  Weeks    Status  On-going            Plan - 01/31/19 1525    Clinical Impression Statement  Pt's HEP adherence has declined over the past week secondary to family conflicts. PT encouraged pt to attempt his program again moving forward and discussed the importance of this. Addressed balance and posture limitations during today's visit. Pt continues to require PT cuing to improve his technique and posture throughout the session. Pt felt a good stretch with PT assisted thoracic extension. He denied any pain during or following today's session and plans to increase HEP adherence moving forward.    Rehab Potential  Excellent    PT Frequency  1x / week    PT Duration  6 weeks    PT Treatment/Interventions  Gait training;Stair training;Functional mobility training;Therapeutic activities;Therapeutic exercise;Balance training;Neuromuscular re-education;Patient/family education;Manual techniques    PT Next Visit Plan  KX;  work on balance, work on [employer] independence; continue at reduced frequency of 1x/week    PT Home Exercise Plan  Access Code: [facility]       Patient will benefit from skilled therapeutic intervention in order to improve the following deficits and impairments:  Abnormal gait, Difficulty walking, Decreased endurance, Decreased balance, Decreased strength, Decreased mobility  Visit Diagnosis: Other abnormalities of gait and mobility  Abnormal posture  Muscle weakness (generalized)     Problem List Patient Active Problem List   Diagnosis Date Noted  . Hearing loss of right ear 01/23/2017  . Mild neurocognitive disorder 09/13/2016  . Type 2 diabetes mellitus without complication, without long-term current use of insulin ([place]) 03/30/2015  . Hyperthyroidism 01/28/2013  . Erectile Dysfunction 02/19/2008  .  Allergic rhinitis 02/19/2008  . Hyperlipidemia  02/15/2007  . Hepatic Cyst 02/15/2007    4:18 PM,01/31/19 Sherol Dade PT, DPT [facility] [address], [place], [place], [address] Phone: [phone]   Fax:  [phone]  Name: Erik Lara MRN: [id number] Date of Birth: 01/27/1936

## 2019-02-05 ENCOUNTER — Ambulatory Visit: Payer: PPO | Admitting: Physical Therapy

## 2019-02-05 ENCOUNTER — Other Ambulatory Visit: Payer: Self-pay

## 2019-02-05 ENCOUNTER — Encounter: Payer: Self-pay | Admitting: Physical Therapy

## 2019-02-05 DIAGNOSIS — R2689 Other abnormalities of gait and mobility: Secondary | ICD-10-CM

## 2019-02-05 DIAGNOSIS — R293 Abnormal posture: Secondary | ICD-10-CM

## 2019-02-05 DIAGNOSIS — M6281 Muscle weakness (generalized): Secondary | ICD-10-CM

## 2019-02-05 NOTE — Therapy (Signed)
[facility] [address] [place], [place], [address] Phone: [phone]   Fax:  [phone]  Physical Therapy Treatment  Patient Details  Name: Erik Lara MRN: [id number] Date of Birth: January 24, 1936 Referring Provider (PT): Dr. Ellouise Newer   Encounter Date: 02/05/2019  PT End of Session - 02/05/19 1526    Visit Number  19    Date for PT Re-Evaluation  02/21/19    Authorization Type  [employer]    PT Start Time  1449    PT Stop Time  1528    PT Time Calculation (min)  39 min    Activity Tolerance  Patient tolerated treatment well;No increased pain    Behavior During Therapy  WFL for tasks assessed/performed       Past Medical History:  Diagnosis Date  . Gallstones    asymptomatic  . Hearing loss of right ear 01/23/2017  . Hepatic Cysts   . Hyperlipidemia   . Hyperthyroidism 01/28/2013  . Mild neurocognitive disorder 09/13/2016  . Prostatitis    requiring a trip to the ER in [place] with a fever - 09  . Type 2 diabetes mellitus without complication, without long-term current use of insulin ([place]) 03/30/2015    Past Surgical History:  Procedure Laterality Date  . HERNIA REPAIR     Childhood    There were no vitals filed for this visit.  Subjective Assessment - 02/05/19 1505    Subjective  Pt states that things are going well. He is working on his HEP more.    Currently in Pain?  No/denies                       [facility] Adult PT Treatment/Exercise - 02/05/19 0001      Neck Exercises: Machines for Strengthening   UBE (Upper Arm Bike)  L1 x2 min forward/backward PT cuing pt to hold head up      Neck Exercises: Standing   Neck Retraction Limitations  thoracic/cervical extension with back against wall with UE flexion to 90 deg (mirror feedback) x10 reps     Other Standing Exercises  rows 3x10 reps with green TB, PT cuing to look ahead at object      Neck Exercises: Seated    Other Seated Exercise  thoracic extension x10 reps          Balance Exercises - 02/05/19 1628      Balance Exercises: Standing   Tandem Gait  Forward   x20 ft, partial tandem CGA   Retro Gait  1 rep   x20 ft CGA       PT Education - 02/05/19 1624    Education Details  technique with therex    Person(s) Educated  Patient    Methods  Explanation;Verbal cues;Tactile cues    Comprehension  Returned demonstration;Verbalized understanding       PT Short Term Goals - 12/20/18 1614      PT SHORT TERM GOAL #1   Title  independent with initial HEP    Time  4    Period  Weeks    Status  Partially Met    Target Date  12/13/18      PT SHORT TERM GOAL #2   Title  stand with his head </= 23 cm due to increased trunk strength and improved posture    Baseline  20 cm    Time  4    Period  Weeks  Status  Achieved    Target Date  12/13/18      PT SHORT TERM GOAL #4   Title  Dynamic Gait index >/= 18 due to improved gait and strength    Baseline  15    Time  4    Period  Weeks    Status  On-going    Target Date  12/13/18        PT Long Term Goals - 01/10/19 1500      PT LONG TERM GOAL #1   Title  Pt will be ind with advanced HEP    Time  8    Period  Weeks    Status  On-going    Target Date  02/21/19      PT LONG TERM GOAL #2   Title  stand with his head from the wall </= 22 cm due to increased trunk strength and posture    Status  Revised      PT LONG TERM GOAL #3   Title  able to walk for 4 minutes without verbal cues to keep his head up due to increased strength    Time  8    Period  Weeks    Status  Revised      PT LONG TERM GOAL #4   Title  Dynamic Gait Index >/= 20 due to improved balance    Baseline  16 on 01/10/19    Time  8    Period  Weeks    Status  On-going            Plan - 02/05/19 1531    Clinical Impression Statement  Pt has increased HEP adherence since his last session. He continues to require PT tactile cuing to improve his  thoracic extension during his sessions. He does well when provided with a visual cue. Pt required CGA with forward tandem walking and was unable to complete full tandem secondary to unsteadiness. He also required cues to widen his base of support when ambulating backwards. Will continue with current POC.    Rehab Potential  Excellent    PT Frequency  1x / week    PT Duration  6 weeks    PT Treatment/Interventions  Gait training;Stair training;Functional mobility training;Therapeutic activities;Therapeutic exercise;Balance training;Neuromuscular re-education;Patient/family education;Manual techniques    PT Next Visit Plan  KX: assess goals and make changes to HEP    PT Home Exercise Plan  Access Code: [email or web address]       Patient will benefit from skilled therapeutic intervention in order to improve the following deficits and impairments:  Abnormal gait, Difficulty walking, Decreased endurance, Decreased balance, Decreased strength, Decreased mobility  Visit Diagnosis: Other abnormalities of gait and mobility  Abnormal posture  Muscle weakness (generalized)     Problem List Patient Active Problem List   Diagnosis Date Noted  . Hearing loss of right ear 01/23/2017  . Mild neurocognitive disorder 09/13/2016  . Type 2 diabetes mellitus without complication, without long-term current use of insulin ([place]) 03/30/2015  . Hyperthyroidism 01/28/2013  . Erectile Dysfunction 02/19/2008  . Allergic rhinitis 02/19/2008  . Hyperlipidemia 02/15/2007  . Hepatic Cyst 02/15/2007    4:29 PM,02/05/19 Sherol Dade PT, DPT [facility] [address], [place] [place], [place], [address] Phone: [phone]   Fax:  [phone]  Name: AUBRA PAPPALARDO MRN: [id number] Date of Birth: 04-22-1935

## 2019-02-12 ENCOUNTER — Ambulatory Visit (INDEPENDENT_AMBULATORY_CARE_PROVIDER_SITE_OTHER): Payer: PPO | Admitting: Psychology

## 2019-02-12 ENCOUNTER — Other Ambulatory Visit: Payer: Self-pay

## 2019-02-12 ENCOUNTER — Encounter: Payer: Self-pay | Admitting: Psychology

## 2019-02-12 DIAGNOSIS — G3184 Mild cognitive impairment, so stated: Secondary | ICD-10-CM

## 2019-02-12 NOTE — Progress Notes (Signed)
   Neuropsychology Feedback Session Erik Lara. [place] Department of Neurology  Reason for Referral:   Erik Lara a 83 y.o. Caucasian male referred by Ellouise Newer, M.D.,to characterize hiscurrent cognitive functioning and assist with diagnostic clarity and treatment planning in the context of a history of a mild neurocognitive disorder and concerns regarding continued cognitive decline.  Feedback:   Erik Lara completed a comprehensive neuropsychological evaluation on 01/30/2019. Please refer to that encounter for the full report and recommendations. Briefly, results suggested mild to moderate frontal-subcortical dysfunction, evidenced by primary deficits in processing speed, as well as additional weaknesses across encoding and retrieval aspects of verbal memory, working memory, verbal fluency, and some aspects of cognitive flexibility. Relative to his prior 2 evaluations, Erik Lara exhibited a notable decline across processing speed. Additional more mild declines were seen across verbal learning and memory, verbal abstraction, and spatial manipulation (although the latter was likely impacted by deficits in processing speed). Other domains, including those representing weaknesses across the current evaluation (e.g., cognitive flexibility and verbal fluency) were stable. Overall, given evidence for cognitive dysfunction, coupled with his report of intact ability to complete activities of daily living (ADLs), Erik Lara continues to meet criteria for a mild neurocognitive disorder (formerly "mild cognitive impairment") at the present time. Given intact visual learning and memory and confrontation naming, coupled with the relative stability of much of Erik Lara's cognitive profile, Alzheimer's disease appears unlikely at the present time.  Erik Lara was unaccompanied during his telephone call. Content of the current session focused on the result of the current  evaluation. Erik Lara was given the opportunity to ask questions and his questions were answered. He was also encouraged to reach out should additional questions arise. A copy of his report was mailed at the conclusion of the visit.      A total of 20 minutes were spent with Erik Lara during the current feedback session.

## 2019-02-14 ENCOUNTER — Ambulatory Visit: Payer: PPO | Attending: Neurology | Admitting: Physical Therapy

## 2019-02-14 ENCOUNTER — Other Ambulatory Visit: Payer: Self-pay

## 2019-02-14 ENCOUNTER — Encounter: Payer: Self-pay | Admitting: Physical Therapy

## 2019-02-14 DIAGNOSIS — R2689 Other abnormalities of gait and mobility: Secondary | ICD-10-CM | POA: Diagnosis not present

## 2019-02-14 DIAGNOSIS — R293 Abnormal posture: Secondary | ICD-10-CM | POA: Insufficient documentation

## 2019-02-14 DIAGNOSIS — M6281 Muscle weakness (generalized): Secondary | ICD-10-CM | POA: Diagnosis not present

## 2019-02-14 NOTE — Therapy (Signed)
[facility] [address], [place], [place], [address] Phone: [phone]   Fax:  [phone]  Physical Therapy Treatment  Patient Details  Name: Erik Lara MRN: [id number] Date of Birth: 1935/12/05 Referring Provider (PT): Dr. Ellouise Newer   Encounter Date: 02/14/2019  PT End of Session - 02/14/19 1457    Visit Number  20    Date for PT Re-Evaluation  02/21/19    Authorization Type  [employer]    PT Start Time  1451    PT Stop Time  1530    PT Time Calculation (min)  39 min    Activity Tolerance  Patient tolerated treatment well;No increased pain    Behavior During Therapy  WFL for tasks assessed/performed       Past Medical History:  Diagnosis Date  . Gallstones    asymptomatic  . Hearing loss of right ear 01/23/2017  . Hepatic Cysts   . Hyperlipidemia   . Hyperthyroidism 01/28/2013  . Mild neurocognitive disorder 09/13/2016  . Prostatitis    requiring a trip to the ER in [place] with a fever - 09  . Type 2 diabetes mellitus without complication, without long-term current use of insulin ([place]) 03/30/2015    Past Surgical History:  Procedure Laterality Date  . HERNIA REPAIR     Childhood    There were no vitals filed for this visit.  Subjective Assessment - 02/14/19 1456    Subjective  Pt states that things are going well. He is trying to do his HEP more often and feels that it is helping his shoulder.    Currently in Pain?  No/denies         [facility] PT Assessment - 02/14/19 0001      Posture/Postural Control   Postural Limitations  Forward head;Rounded Shoulders    Posture Comments  back of head 20 cm from the wall                    [facility] Adult PT Treatment/Exercise - 02/14/19 0001      Neck Exercises: Standing   Neck Retraction Limitations  thoracic/cervical extension with back against wall x15 reps 5 sec hold       Knee/Hip Exercises: Aerobic   Nustep  L3 x7  min, PT present to discuss progress and encourage head up      Knee/Hip Exercises: Standing   Heel Raises  Both;1 set;20 reps      Neck Exercises: Stretches   Chest Stretch  3 reps;20 seconds   in doorway          Balance Exercises - 02/14/19 1511      Balance Exercises: Standing   Step Over Hurdles / Cones  2 pool noodles without stopping CGA x4 trials     Other Standing Exercises  forward walking in floor ladder x4 trials CGA, pt encouraged to keep 1 LE in each square; weaving in/out of 3 cones (50f apart) x2 trials, 441fapart x2 trials CGA; walking forward 1560fith turn around cone Lt and Rt direction         PT Education - 02/14/19 1536    Education Details  updates to HEP    Person(s) Educated  Patient    Methods  Explanation;Demonstration;Handout    Comprehension  Verbalized understanding;Returned demonstration       PT Short Term Goals - 02/14/19 1457      PT SHORT TERM GOAL #1   Title  independent with initial HEP    Time  4    Period  Weeks    Status  Partially Met    Target Date  12/13/18      PT SHORT TERM GOAL #2   Title  stand with his head </= 23 cm due to increased trunk strength and improved posture    Baseline  20 cm    Time  4    Period  Weeks    Status  Achieved    Target Date  12/13/18      PT SHORT TERM GOAL #4   Title  Dynamic Gait index >/= 18 due to improved gait and strength    Baseline  15    Time  4    Period  Weeks    Status  On-going    Target Date  12/13/18        PT Long Term Goals - 02/14/19 1458      PT LONG TERM GOAL #1   Title  Pt will be ind with advanced HEP    Time  8    Period  Weeks    Status  On-going      PT LONG TERM GOAL #2   Title  stand with his head from the wall </= 22 cm due to increased trunk strength and posture    Status  Revised      PT LONG TERM GOAL #3   Title  able to walk for 4 minutes without verbal cues to keep his head up due to increased strength    Time  8    Period  Weeks     Status  Revised      PT LONG TERM GOAL #4   Title  Dynamic Gait Index >/= 20 due to improved balance    Baseline  16 on 01/10/19    Time  8    Period  Weeks    Status  On-going            Plan - 02/14/19 1538    Clinical Impression Statement  Pt continues to demonstrate improvements in posture. He was able to reach his head within 20cm of the wall compared to greater than 30cm at his evaluation. Pt also demonstrates improved ability to maintain upright posture during activities in the session, with intermittent PT reminders needed. Pt's HEP was updated and he was encouraged to make some adjustments to theraband resistance and set up for increased adherence moving forward. Pt will follow up for one more visit of PT to ensure he has full understanding of HEP adjustments with likely discharge.    Rehab Potential  Excellent    PT Frequency  1x / week    PT Duration  6 weeks    PT Treatment/Interventions  Gait training;Stair training;Functional mobility training;Therapeutic activities;Therapeutic exercise;Balance training;Neuromuscular re-education;Patient/family education;Manual techniques    PT Next Visit Plan  KX: f/u on TB around knees (HEP), DGI, discharge likely    PT Home Exercise Plan  Access Code: [email or web address]       Patient will benefit from skilled therapeutic intervention in order to improve the following deficits and impairments:  Abnormal gait, Difficulty walking, Decreased endurance, Decreased balance, Decreased strength, Decreased mobility  Visit Diagnosis: Other abnormalities of gait and mobility  Abnormal posture  Muscle weakness (generalized)     Problem List Patient Active Problem List   Diagnosis Date Noted  . Hearing loss of right ear 01/23/2017  . Mild neurocognitive disorder 09/13/2016  .  Type 2 diabetes mellitus without complication, without long-term current use of insulin ([facility]) 03/30/2015  . Hyperthyroidism 01/28/2013  . Erectile Dysfunction 02/19/2008   . Allergic rhinitis 02/19/2008  . Hyperlipidemia 02/15/2007  . Hepatic Cyst 02/15/2007    3:42 PM,02/14/19   PT, DPT [facility]  [phone]  [facility] [address] [place], [place], [address] Phone: [phone]   Fax:  [phone]  Name: Sabas T Berthelot MRN: [id number] Date of Birth: 04/08/1935   

## 2019-02-14 NOTE — Patient Instructions (Signed)
Access Code: [email or web address]  URL: [email or web address]  Date: 02/14/2019  Prepared by: Sherol Dade   Exercises  Standing Hip Abduction - 10 reps - 2 sets - 1x daily - 7x weekly  Seated Thoracic Lumbar Extension - 10 reps - 2x daily - 7x weekly  Seated Shoulder Horizontal Abduction with Resistance - 10 reps - 3 sets - 1x daily - 7x weekly  Seated Table Hamstring Stretch - 2 reps - 30 hold - 1x daily - 7x weekly  Cervical Retraction at Wall - 10 reps - 3 sets - 1x daily - 7x weekly  Doorway Pec Stretch at 60 Elevation - 3 reps - 20 hold - 1x daily - 7x weekly  Heel Raise - 20 reps - 1x daily - 7x weekly    [facility] [address] [place], [place] [address] Phone # [phone] Fax [phone]

## 2019-02-19 ENCOUNTER — Other Ambulatory Visit: Payer: Self-pay

## 2019-02-19 ENCOUNTER — Encounter: Payer: Self-pay | Admitting: Physical Therapy

## 2019-02-19 ENCOUNTER — Ambulatory Visit: Payer: PPO | Admitting: Physical Therapy

## 2019-02-19 DIAGNOSIS — R2689 Other abnormalities of gait and mobility: Secondary | ICD-10-CM | POA: Diagnosis not present

## 2019-02-19 DIAGNOSIS — R293 Abnormal posture: Secondary | ICD-10-CM

## 2019-02-19 DIAGNOSIS — M6281 Muscle weakness (generalized): Secondary | ICD-10-CM

## 2019-02-19 NOTE — Patient Instructions (Signed)
Access Code: [email or web address]  URL: [email or web address]  Date: 02/19/2019  Prepared by: Sherol Dade   Exercises  Standing Hip Abduction - 15 reps - 2 sets - 1x daily - 7x weekly  Seated Thoracic Lumbar Extension - 10 reps - 2x daily - 7x weekly  Seated Shoulder Horizontal Abduction with Resistance - 10 reps - 3 sets - 1x daily - 7x weekly  Seated Table Hamstring Stretch - 2 reps - 30 hold - 1x daily - 7x weekly  Cervical Retraction at Wall - 10 reps - 3 sets - 1x daily - 7x weekly  Doorway Pec Stretch at 60 Elevation - 3 reps - 20 hold - 1x daily - 7x weekly  Heel Raise - 20 reps - 1x daily - 7x weekly    [facility] [address] [place], [place] [address] Phone # [phone] Fax [phone]

## 2019-02-19 NOTE — Therapy (Signed)
[facility] [address] [place], [place], [address] Phone: [phone]   Fax:  [phone]  Physical Therapy Treatment/Discharge  Patient Details  Name: Erik Lara MRN: [id number] Date of Birth: 1935/03/22 Referring Provider (PT): Dr. Ellouise Newer   Encounter Date: 02/19/2019  PT End of Session - 02/19/19 1519    Visit Number  21    Date for PT Re-Evaluation  02/21/19    Authorization Type  [employer]    PT Start Time  1455   pt arrived wrong day and late to appointment   PT Stop Time  1530    PT Time Calculation (min)  35 min    Activity Tolerance  Patient tolerated treatment well;No increased pain    Behavior During Therapy  WFL for tasks assessed/performed       Past Medical History:  Diagnosis Date  . Gallstones    asymptomatic  . Hearing loss of right ear 01/23/2017  . Hepatic Cysts   . Hyperlipidemia   . Hyperthyroidism 01/28/2013  . Mild neurocognitive disorder 09/13/2016  . Prostatitis    requiring a trip to the ER in [place] with a fever - 09  . Type 2 diabetes mellitus without complication, without long-term current use of insulin ([place]) 03/30/2015    Past Surgical History:  Procedure Laterality Date  . HERNIA REPAIR     Childhood    There were no vitals filed for this visit.  Subjective Assessment - 02/19/19 1458    Subjective  Pt states that things are going well. He tried his HEP but still can't get the band around his knees.    Currently in Pain?  No/denies         [facility] PT Assessment - 02/19/19 0001      Assessment   Medical Diagnosis  R26.81 Gait instability    Referring Provider (PT)  Dr. Ellouise Newer      Precautions   Precautions  None      Restrictions   Weight Bearing Restrictions  No      Balance Screen   Has the patient fallen in the past 6 months  No   none since starting PT   Has the patient had a decrease in activity level because of a fear  of falling?   No    Is the patient reluctant to leave their home because of a fear of falling?   No      Home Film/video editor residence      Prior Function   Level of Independence  Independent      Cognition   Overall Cognitive Status  Within Functional Limits for tasks assessed      Observation/Other Assessments   Observations  stand against the wall and measure from the wall to crown of head is 26 cm      Posture/Postural Control   Posture/Postural Control  Postural limitations    Postural Limitations  Forward head;Rounded Shoulders;Increased thoracic kyphosis;Flexed trunk    Posture Comments  back of head 20 cm from the wall       Strength   Right Hip ABduction  4+/5    Right Hip ADduction  4+/5    Left Hip Extension  4-/5    Left Hip ABduction  5/5    Left Hip ADduction  5/5      Berg Balance Test   Sit to Stand  Able to stand without using hands  and stabilize independently    Standing Unsupported  Able to stand safely 2 minutes    Sitting with Back Unsupported but Feet Supported on Floor or Stool  Able to sit safely and securely 2 minutes    Stand to Sit  Sits safely with minimal use of hands    Transfers  Able to transfer safely, minor use of hands    Standing Unsupported with Eyes Closed  Able to stand 10 seconds safely    Standing Unsupported with Feet Together  Able to place feet together independently and stand 1 minute safely    From Standing, Reach Forward with Outstretched Arm  Can reach forward >12 cm safely (5")    From Standing Position, Pick up Object from [place] to pick up shoe safely and easily    From Standing Position, Turn to Look Behind Over each Shoulder  Looks behind from both sides and weight shifts well    Turn 360 Degrees  Able to turn 360 degrees safely but slowly    Standing Unsupported, Alternately Place Feet on Step/Stool  Able to stand independently and complete 8 steps >20 seconds    Standing Unsupported, One  Foot in Front  Able to take small step independently and hold 30 seconds    Standing on One Leg  Tries to lift leg/unable to hold 3 seconds but remains standing independently    Total Score  47      Dynamic Gait Index   Level Surface  Normal    Change in Gait Speed  Mild Impairment    Gait with Horizontal Head Turns  Mild Impairment    Gait with Vertical Head Turns  Mild Impairment    Gait and Pivot Turn  Mild Impairment    Step Over Obstacle  Mild Impairment    Step Around Obstacles  Mild Impairment    Steps  Mild Impairment    Total Score  17      Timed Up and Go Test   Normal TUG (seconds)  13                   [facility] Adult PT Treatment/Exercise - 02/19/19 0001      Knee/Hip Exercises: Standing   Hip Abduction  Stengthening;Left;Right;1 set;15 reps    Abduction Limitations  UE support, PT cuing to improve posture              PT Education - 02/19/19 1536    Education Details  importance of maintaining HEP adherence and completing all exercises rather than "favorites"    Person(s) Educated  Patient    Methods  Explanation;Handout    Comprehension  Verbalized understanding       PT Short Term Goals - 02/19/19 1515      PT SHORT TERM GOAL #1   Title  independent with initial HEP    Time  4    Period  Weeks    Status  Partially Met    Target Date  12/13/18      PT SHORT TERM GOAL #2   Title  stand with his head </= 23 cm due to increased trunk strength and improved posture    Baseline  20 cm    Time  4    Period  Weeks    Status  Achieved    Target Date  12/13/18      PT SHORT TERM GOAL #4   Title  Dynamic Gait index >/= 18 due to improved  gait and strength    Baseline  15    Time  4    Period  Weeks    Status  On-going    Target Date  12/13/18        PT Long Term Goals - 02/19/19 1516      PT LONG TERM GOAL #1   Title  Pt will be ind with advanced HEP    Time  8    Period  Weeks    Status  Partially Met      PT LONG TERM GOAL #2    Title  stand with his head from the wall </= 22 cm due to increased trunk strength and posture    Baseline  20 cm    Status  Achieved      PT LONG TERM GOAL #3   Title  able to walk for 4 minutes without verbal cues to keep his head up due to increased strength    Baseline  improved but requires intermittent cuing    Time  8    Period  Weeks    Status  Partially Met      PT LONG TERM GOAL #4   Title  Dynamic Gait Index >/= 20 due to improved balance    Baseline  17    Time  8    Period  Weeks    Status  Partially Met            Plan - 02/19/19 1538    Clinical Impression Statement  Pt was discharged having met all but one of his short- and long-term goals. Pt's posture awareness and flexibility has significantly improved, evident by his ability to reach the back of the head within 20 cm of the wall. Pt's balance has also improved, with a 3 point increase in the DGI and 2 point increase in the BERG since the last assessment. Pt has struggled with HEP adherence throughout his POC, but has recently been more consistent. PT made some adjustments to his final program and educated him on the importance of consistency moving forward in order to prevent decline in function. Pt was agreeable with discharge and is pleased with his progress.    Rehab Potential  Excellent    PT Frequency  1x / week    PT Duration  6 weeks    PT Treatment/Interventions  Gait training;Stair training;Functional mobility training;Therapeutic activities;Therapeutic exercise;Balance training;Neuromuscular re-education;Patient/family education;Manual techniques    PT Next Visit Plan  d/c    PT Home Exercise Plan  Access Code: [place]       Patient will benefit from skilled therapeutic intervention in order to improve the following deficits and impairments:  Abnormal gait, Difficulty walking, Decreased endurance, Decreased balance, Decreased strength, Decreased mobility  Visit Diagnosis: Other abnormalities of  gait and mobility  Abnormal posture  Muscle weakness (generalized)    PHYSICAL THERAPY DISCHARGE SUMMARY  Visits from Start of Care: 21  Current functional level related to goals / functional outcomes: See above for more details    Remaining deficits: See above for more details    Education / Equipment: See above for more details   Plan: Patient agrees to discharge.  Patient goals were partially met. Patient is being discharged due to being pleased with the current functional level.  ?????         Problem List Patient Active Problem List   Diagnosis Date Noted  . Hearing loss of right ear 01/23/2017  . Mild neurocognitive  disorder 09/13/2016  . Type 2 diabetes mellitus without complication, without long-term current use of insulin ([place]) 03/30/2015  . Hyperthyroidism 01/28/2013  . Erectile Dysfunction 02/19/2008  . Allergic rhinitis 02/19/2008  . Hyperlipidemia 02/15/2007  . Hepatic Cyst 02/15/2007    3:43 PM,02/19/19 Sherol Dade PT, DPT [facility] [address] [place], [place], [address] Phone: [phone]   Fax:  [phone]  Name: Erik Lara MRN: [id number] Date of Birth: Aug 18, 1935

## 2019-02-21 ENCOUNTER — Ambulatory Visit: Payer: Self-pay | Admitting: Physical Therapy

## 2019-03-26 DIAGNOSIS — L819 Disorder of pigmentation, unspecified: Secondary | ICD-10-CM | POA: Diagnosis not present

## 2019-03-26 DIAGNOSIS — L570 Actinic keratosis: Secondary | ICD-10-CM | POA: Diagnosis not present

## 2019-03-30 ENCOUNTER — Other Ambulatory Visit: Payer: Self-pay | Admitting: Adult Health

## 2019-04-27 ENCOUNTER — Ambulatory Visit: Payer: PPO | Attending: Internal Medicine

## 2019-04-27 DIAGNOSIS — Z23 Encounter for immunization: Secondary | ICD-10-CM

## 2019-04-27 NOTE — Progress Notes (Signed)
   Covid-19 Vaccination Clinic  Name:  ROC TEEM    MRN: [id number] DOB: 23-Jul-1935  04/27/2019  [name] was observed post Covid-19 immunization for 15 minutes without incidence. He was provided with Vaccine Information Sheet and instruction to access the V-Safe system.   [name] was instructed to call 911 with any severe reactions post vaccine: Marland Kitchen Difficulty breathing  . Swelling of your face and throat  . A fast heartbeat  . A bad rash all over your body  . Dizziness and weakness    Immunizations Administered    Name Date Dose VIS Date Route   Pfizer COVID-19 Vaccine 04/27/2019  9:32 AM 0.3 mL 02/22/2019 Intramuscular   Manufacturer: [place]   Lot: [id number]   [place]: [id number]

## 2019-05-20 ENCOUNTER — Ambulatory Visit: Payer: PPO | Attending: Internal Medicine

## 2019-05-20 DIAGNOSIS — Z23 Encounter for immunization: Secondary | ICD-10-CM

## 2019-05-20 NOTE — Progress Notes (Signed)
   Covid-19 Vaccination Clinic  Name:  Erik Lara    MRN: [id number] DOB: 05-28-1935  05/20/2019  Mr. Erik Lara was observed post Covid-19 immunization for 15 minutes without incident. He was provided with Vaccine Information Sheet and instruction to access the V-Safe system.   Mr. Erik Lara was instructed to call 911 with any severe reactions post vaccine: Marland Kitchen Difficulty breathing  . Swelling of face and throat  . A fast heartbeat  . A bad rash all over body  . Dizziness and weakness   Immunizations Administered    Name Date Dose VIS Date Route   Pfizer COVID-19 Vaccine 05/20/2019 10:40 AM 0.3 mL 02/22/2019 Intramuscular   Manufacturer: [place]   Lot: [id number]   [place]: [id number]

## 2019-06-10 ENCOUNTER — Ambulatory Visit: Payer: PPO | Admitting: Neurology

## 2019-06-28 ENCOUNTER — Other Ambulatory Visit: Payer: Self-pay | Admitting: Adult Health

## 2019-06-28 NOTE — Telephone Encounter (Signed)
Denied.  Needs diabetes/A1C follow up.

## 2019-07-23 ENCOUNTER — Other Ambulatory Visit: Payer: Self-pay

## 2019-07-23 ENCOUNTER — Ambulatory Visit: Payer: PPO | Admitting: Neurology

## 2019-07-23 ENCOUNTER — Encounter: Payer: Self-pay | Admitting: Neurology

## 2019-07-23 VITALS — BP 131/77 | HR 85 | Ht 73.0 in | Wt 204.4 lb

## 2019-07-23 DIAGNOSIS — R2681 Unsteadiness on feet: Secondary | ICD-10-CM | POA: Diagnosis not present

## 2019-07-23 DIAGNOSIS — G3184 Mild cognitive impairment, so stated: Secondary | ICD-10-CM

## 2019-07-23 MED ORDER — DONEPEZIL HCL 10 MG PO TABS: 10.0000 mg | ORAL_TABLET | Freq: Every day | ORAL | 3 refills | Status: DC

## 2019-07-23 NOTE — Progress Notes (Signed)
NEUROLOGY FOLLOW UP OFFICE NOTE  STACI EICHENAUER [id number] 06/01/1935  HISTORY OF PRESENT ILLNESS: I had the pleasure of seeing Erik Lara in follow-up in the neurology clinic on 07/23/2019.  The patient was last seen 9 months ago for worsening memory. On his last visit, he continued to report memory concerns. Repeat Neuropsychological testing was done 01/2019 which showed mild to moderate frontal-subcortical dysfunction with primary deficits in processing speed, encoding and retrieval aspects of verbal memory, working memory, verbal fluency, and some aspects of cognitive flexibility. Compared to prior testing, there was notable decline across processing speed, and more mild declines across other domins. He continued to meet criteria for Mild neurocognitive disorder, etiology unclear, profile is consistent with what is seen in a vascular neurocognitive disorder, however brain imaging is not congruent. Alzheimer's appears unlikely. He is on Donepezil 10mg  daily without side effects. Since his last visit, he reports his memory has gotten shaky. He planned to bring his copy of Neuropsych results but 5 minutes later he could not find it. He spends an awful lot of time looking for things. He is trying to do the taxes but will not do it next year, "too much stress." He has difficulties using his smartphone, he cannot get to the next step. He has cut down on driving and denies getting lost, but occasionally would not know where he is. He loses the mental picture of his destination, but once he gets the picture, he is fine. He denies missing medications, he fixes his box weekly. He denies any headaches, dizziness, no neck/back pain. He reports balance is not what it ought to be, he has had minor falls in the past year.    History on Initial Assessment 03/30/2015: This is a pleasant 84 yo RH man with a history of hyperlipidemia, diabetes, who presented for memory loss. He started noticing changes around 1-1/2  years ago, he would notice his memory would be "spotty," with a blank spot occurring once in a while. He reported this to his PCP, then again on his next follow-up visit last November 2016. He was concerned about an episode [place] a familiar place, when he could not recall which exit to take. He missed the exit and had to turn around. He has noticed moments while driving where he briefly has a blank spot ("new garden or spring garden") then quickly passes. He has been told by his wife that he repeats himself. He has always had word-finding difficulties, but noticed this has worsened. He infrequently misses bill payments, stating he has a record system, but would miss paying if he gets very busy. He denies any missed medications. He denies any family history of dementia, history of head injury or alcohol use.  He has had poor sense of smell for many years. He has noticed some difficulty reading from afar, better if he looks closer. He has occasional cramping in the calf muscles, left>right, worse at night. He denies any falls. He goes to exercise class 3-4 times a week.   PAST MEDICAL HISTORY: Past Medical History:  Diagnosis Date  . Gallstones    asymptomatic  . Hearing loss of right ear 01/23/2017  . Hepatic Cysts   . Hyperlipidemia   . Hyperthyroidism 01/28/2013  . Mild neurocognitive disorder 09/13/2016  . Prostatitis    requiring a trip to the ER in [place] with a fever - 09  . Type 2 diabetes mellitus without complication, without  long-term current use of insulin ([place]) 03/30/2015    MEDICATIONS: Current Outpatient Medications on File Prior to Visit  Medication Sig Dispense Refill  . aspirin 81 MG tablet Take 81 mg by mouth daily.    . cetirizine (ZYRTEC) 10 MG tablet Take 10 mg by mouth daily.    Marland Kitchen donepezil (ARICEPT) 10 MG tablet Take 1 tablet (10 mg total) by mouth at bedtime. 90 tablet 3  . FREESTYLE LITE test strip Use to test blood glucose  once daily 100 each 3  . Lancets (FREESTYLE) lancets USE ONCE DAILY 100 each 3  . metFORMIN (GLUCOPHAGE) 500 MG tablet TAKE ONE-HALF TABLET BY MOUTH EVERY MORNING 45 tablet 1  . simvastatin (ZOCOR) 20 MG tablet TAKE ONE TABLET BY MOUTH EVERY NIGHT AT BEDTIME 90 tablet 2   No current facility-administered medications on file prior to visit.    ALLERGIES: No Known Allergies  FAMILY HISTORY: Family History  Problem Relation Age of Onset  . Cancer Other        breast  . Diabetes Other     SOCIAL HISTORY: Social History   Socioeconomic History  . Marital status: Married    Spouse name: Not on file  . Number of children: 3  . Years of education: 47  . Highest education level: Master's degree (e.g., MA, MS, MEng, MEd, MSW, MBA)  Occupational History  . Occupation: Retired  Tobacco Use  . Smoking status: Never Smoker  . Smokeless tobacco: Never Used  Substance and Sexual Activity  . Alcohol use: Yes    Alcohol/week: 0.0 standard drinks    Comment: Beer weekly/glass of wine daily  . Drug use: No  . Sexual activity: Not Currently    Partners: Female  Other Topics Concern  . Not on file  Social History Narrative   Lives with wife      Right handed      Master's Degree from [employer]   Social Determinants of Health   Financial Resource Strain:   . Difficulty of Paying Living Expenses:   Food Insecurity:   . Worried About Charity fundraiser in the Last Year:   . Arboriculturist in the Last Year:   Transportation Needs:   . Film/video editor (Medical):   Marland Kitchen Lack of Transportation (Non-Medical):   Physical Activity:   . Days of Exercise per Week:   . Minutes of Exercise per Session:   Stress:   . Feeling of Stress :   Social Connections:   . Frequency of Communication with Friends and Family:   . Frequency of Social Gatherings with Friends and Family:   . Attends Religious Services:   . Active Member of Clubs or Organizations:   . Attends Archivist  Meetings:   Marland Kitchen Marital Status:   Intimate Partner Violence:   . Fear of Current or Ex-Partner:   . Emotionally Abused:   Marland Kitchen Physically Abused:   . Sexually Abused:     REVIEW OF SYSTEMS: Constitutional: No fevers, chills, or sweats, no generalized fatigue, change in appetite Eyes: No visual changes, double vision, eye pain Ear, nose and throat: No hearing loss, ear pain, nasal congestion, sore throat Cardiovascular: No chest pain, palpitations Respiratory:  No shortness of breath at rest or with exertion, wheezes GastrointestinaI: No nausea, vomiting, diarrhea, abdominal pain, fecal incontinence Genitourinary:  No dysuria, urinary retention or frequency Musculoskeletal:  No neck pain, back pain Integumentary: No rash, pruritus, skin lesions Neurological: as above Psychiatric: No  depression, insomnia, anxiety Endocrine: No palpitations, fatigue, diaphoresis, mood swings, change in appetite, change in weight, increased thirst Hematologic/Lymphatic:  No anemia, purpura, petechiae. Allergic/Immunologic: no itchy/runny eyes, nasal congestion, recent allergic reactions, rashes  PHYSICAL EXAM: Vitals:   07/23/19 1416  BP: 131/77  Pulse: 85  SpO2: 97%   General: No acute distress, head flexed forward Head:  Normocephalic/atraumatic Skin/Extremities: No rash, no edema Neurological Exam: alert and oriented to person, place, and time. No aphasia or dysarthria. Fund of knowledge is appropriate.  Recent and remote memory are impaired.  Attention and concentration are normal. Cranial nerves: Pupils equal, round, reactive to light. Extraocular movements intact with no nystagmus. Visual fields full. No facial asymmetry. Motor: Bulk and tone normal, no cogwheeling, muscle strength 5/5 throughout with no pronator drift.  Finger to nose testing intact.  Gait: hunched posture, slightly wide-based gait with decreased arm swing. No tremor. No postural instability. Good finger taps and  RAMS.   IMPRESSION: This is a pleasant 84 yo RH man with a vascular risk factors including diabetes, hyperlipidemia, who presented last January 2017 for memory loss. He continues to report cognitive decline. Repeat Neuropsychological testing in 01/2019 showed mild to moderate frontal-subcortical dysfunction that is typically seen with vascular neurocognitive disorder, however prior brain imaging in 2017 was not congruent. Alzheimer's appears unlikely. Repeat MRI brain without contrast will be ordered to assess for underlying structural abnormality and assess vascular load. Continue Donepezil 10mg  daily. We again discussed balance issues, hunched posture, no other parkinsonian signs. He declines balacne therapy and will let us know when he would like to proceed. Continue to monitor driving. Follow-up in 6 months, he knows to call for any changes.    Thank you for allowing me to participate in his care.  Please do not hesitate to call for any questions or concerns.   Erik Lara, M.D.   CC: Dorothyann Peng, NP

## 2019-07-23 NOTE — Patient Instructions (Signed)
1. Schedule MRI brain without contrast  2. Continue Donepezil 10mg  daily  3. Continue to monitor balance, let me know if you would like to do balance therapy  4. Follow-up in 6 months, call for any changes  FALL PRECAUTIONS: Be cautious when walking. Scan the area for obstacles that may increase the risk of trips and falls. When getting up in the mornings, sit up at the edge of the bed for a few minutes before getting out of bed. Consider elevating the bed at the head end to avoid drop of blood pressure when getting up. Walk always in a well-lit room (use night lights in the walls). Avoid area rugs or power cords from appliances in the middle of the walkways. Use a walker or a cane if necessary and consider physical therapy for balance exercise. Get your eyesight checked regularly.  FINANCIAL OVERSIGHT: Supervision, especially oversight when making financial decisions or transactions is also recommended as difficulties arise.  HOME SAFETY: Consider the safety of the kitchen when operating appliances like stoves, microwave oven, and blender. Consider having supervision and share cooking responsibilities until no longer able to participate in those. Accidents with firearms and other hazards in the house should be identified and addressed as well.  DRIVING: Regarding driving, in patients with progressive memory problems, driving will be impaired. We advise to have someone else do the driving if trouble finding directions or if minor accidents are reported. Independent driving assessment is available to determine safety of driving.  ABILITY TO BE LEFT ALONE: If patient is unable to contact 911 operator, consider using LifeLine, or when the need is there, arrange for someone to stay with patients. Smoking is a fire hazard, consider supervision or cessation. Risk of wandering should be assessed by caregiver and if detected at any point, supervision and safe proof recommendations should be  instituted.  MEDICATION SUPERVISION: Inability to self-administer medication needs to be constantly addressed. Implement a mechanism to ensure safe administration of the medications.  RECOMMENDATIONS FOR ALL PATIENTS WITH MEMORY PROBLEMS: 1. Continue to exercise (Recommend 30 minutes of walking everyday, or 3 hours every week) 2. Increase social interactions - continue going to [place] and enjoy social gatherings with friends and family 3. Eat healthy, avoid fried foods and eat more fruits and vegetables 4. Maintain adequate blood pressure, blood sugar, and blood cholesterol level. Reducing the risk of stroke and cardiovascular disease also helps promoting better memory. 5. Avoid stressful situations. Live a simple life and avoid aggravations. Organize your time and prepare for the next day in anticipation. 6. Sleep well, avoid any interruptions of sleep and avoid any distractions in the bedroom that may interfere with adequate sleep quality 7. Avoid sugar, avoid sweets as there is a strong link between excessive sugar intake, diabetes, and cognitive impairment The Mediterranean diet has been shown to help patients reduce the risk of progressive memory disorders and reduces cardiovascular risk. This includes eating fish, eat fruits and green leafy vegetables, nuts like almonds and hazelnuts, walnuts, and also use olive oil. Avoid fast foods and fried foods as much as possible. Avoid sweets and sugar as sugar use has been linked to worsening of memory function.

## 2019-07-24 DIAGNOSIS — L819 Disorder of pigmentation, unspecified: Secondary | ICD-10-CM | POA: Diagnosis not present

## 2019-07-24 DIAGNOSIS — L570 Actinic keratosis: Secondary | ICD-10-CM | POA: Diagnosis not present

## 2019-07-30 ENCOUNTER — Other Ambulatory Visit: Payer: Self-pay | Admitting: Adult Health

## 2019-07-31 ENCOUNTER — Other Ambulatory Visit: Payer: Self-pay

## 2019-07-31 ENCOUNTER — Telehealth: Payer: Self-pay | Admitting: Adult Health

## 2019-07-31 NOTE — Telephone Encounter (Signed)
NOTED.  I HAVE RECEIVED A REFILL REQUEST.

## 2019-07-31 NOTE — Telephone Encounter (Signed)
Pt would like a refill on Metformin 500mg  send to: [address]

## 2019-07-31 NOTE — Telephone Encounter (Signed)
DENIED.  NEEDS OV FOR FURTHER REFILLS.

## 2019-07-31 NOTE — Telephone Encounter (Signed)
Spoke to pt and scheduled an appt on 08/01/19 at 1:30 p.m.

## 2019-07-31 NOTE — Telephone Encounter (Signed)
HE NEEDS TO BE SEEN IN THE OFFICE FOR DIABETES FOLLOW UP.  PLEASE SCHEDULE HIM.  LET ME KNOW WHEN HE IS SCHEDULED AND I WILL FILL MEDICATION UNTIL HE COMES IN.

## 2019-08-01 ENCOUNTER — Encounter: Payer: Self-pay | Admitting: Adult Health

## 2019-08-01 ENCOUNTER — Ambulatory Visit (INDEPENDENT_AMBULATORY_CARE_PROVIDER_SITE_OTHER): Payer: PPO | Admitting: Adult Health

## 2019-08-01 VITALS — BP 144/80 | Temp 98.1°F | Wt 205.0 lb

## 2019-08-01 DIAGNOSIS — E119 Type 2 diabetes mellitus without complications: Secondary | ICD-10-CM

## 2019-08-01 LAB — POCT GLYCOSYLATED HEMOGLOBIN (HGB A1C): HbA1c, POC (controlled diabetic range): 5.9 % (ref 0.0–7.0)

## 2019-08-01 NOTE — Progress Notes (Signed)
Subjective:    Patient ID: Erik Lara, Erik Lara    DOB: [birth year], 84 y.o.   MRN: [id number]  HPI  84 year old Erik Lara who  has a past medical history of Gallstones, Hearing loss of right ear (01/23/2017), Hepatic Cysts, Hyperlipidemia, Hyperthyroidism (01/28/2013), Mild neurocognitive disorder (09/13/2016), Prostatitis, and Type 2 diabetes mellitus without complication, without long-term current use of insulin ([place]) (03/30/2015).  He presents to the office today for six month follow up regarding DM. He is currently maintained on metformin 250 mg daily. He checks his blood sugars on a consistent basis and continues to get readings in the 120-130's. He denies any hypoglycemic events.   Lab Results  Component Value Date   HGBA1C 6.6 (H) 11/06/2018   His wife cooks him a heart healthy diet that his wife cooks for him daily.   Review of Systems See HPI   Past Medical History:  Diagnosis Date  . Gallstones    asymptomatic  . Hearing loss of right ear 01/23/2017  . Hepatic Cysts   . Hyperlipidemia   . Hyperthyroidism 01/28/2013  . Mild neurocognitive disorder 09/13/2016  . Prostatitis    requiring a trip to the ER in [place] with a fever - 09  . Type 2 diabetes mellitus without complication, without long-term current use of insulin ([place]) 03/30/2015    Social History   Socioeconomic History  . Marital status: Married    Spouse name: Not on file  . Number of children: 3  . Years of education: 27  . Highest education level: Master's degree (e.g., MA, MS, MEng, MEd, MSW, MBA)  Occupational History  . Occupation: Retired  Tobacco Use  . Smoking status: Never Smoker  . Smokeless tobacco: Never Used  Substance and Sexual Activity  . Alcohol use: Yes    Alcohol/week: 0.0 standard drinks    Comment: Beer weekly/glass of wine daily  . Drug use: No  . Sexual activity: Not Currently    Partners: Female  Other Topics Concern  . Not on file  Social History Narrative   Lives with  wife      Right handed      Master's Degree from [employer]   Social Determinants of Health   Financial Resource Strain:   . Difficulty of Paying Living Expenses:   Food Insecurity:   . Worried About Charity fundraiser in the Last Year:   . Arboriculturist in the Last Year:   Transportation Needs:   . Film/video editor (Medical):   Marland Kitchen Lack of Transportation (Non-Medical):   Physical Activity:   . Days of Exercise per Week:   . Minutes of Exercise per Session:   Stress:   . Feeling of Stress :   Social Connections:   . Frequency of Communication with Friends and Family:   . Frequency of Social Gatherings with Friends and Family:   . Attends Religious Services:   . Active Member of Clubs or Organizations:   . Attends Archivist Meetings:   Marland Kitchen Marital Status:   Intimate Partner Violence:   . Fear of Current or Ex-Partner:   . Emotionally Abused:   Marland Kitchen Physically Abused:   . Sexually Abused:     Past Surgical History:  Procedure Laterality Date  . HERNIA REPAIR     Childhood    Family History  Problem Relation Age of Onset  . Cancer Other        breast  . Diabetes Other  No Known Allergies  Current Outpatient Medications on File Prior to Visit  Medication Sig Dispense Refill  . aspirin 81 MG tablet Take 81 mg by mouth daily.    . cetirizine (ZYRTEC) 10 MG tablet Take 10 mg by mouth daily.    Marland Kitchen donepezil (ARICEPT) 10 MG tablet Take 1 tablet (10 mg total) by mouth at bedtime. 90 tablet 3  . FREESTYLE LITE test strip Use to test blood glucose once daily 100 each 3  . Lancets (FREESTYLE) lancets USE ONCE DAILY 100 each 3  . metFORMIN (GLUCOPHAGE) 500 MG tablet TAKE ONE-HALF TABLET BY MOUTH EVERY MORNING 45 tablet 1  . simvastatin (ZOCOR) 20 MG tablet TAKE ONE TABLET BY MOUTH EVERY NIGHT AT BEDTIME 90 tablet 2   No current facility-administered medications on file prior to visit.    There were no vitals taken for this visit.      Objective:    Physical Exam Vitals and nursing note reviewed.  Constitutional:      Appearance: Normal appearance.  HENT:     Head: Normocephalic and atraumatic.  Cardiovascular:     Rate and Rhythm: Normal rate and regular rhythm.     Pulses: Normal pulses.     Heart sounds: Normal heart sounds.  Pulmonary:     Effort: Pulmonary effort is normal.     Breath sounds: Normal breath sounds.  Musculoskeletal:        General: Normal range of motion.  Skin:    General: Skin is warm and dry.     Capillary Refill: Capillary refill takes less than 2 seconds.  Neurological:     General: No focal deficit present.     Mental Status: He is alert.       Assessment & Plan:  1. Type 2 diabetes mellitus without complication, without long-term current use of insulin (HCC)  - POCT A1C- 5.9  - will have him D/C Metformin and follow up in 3 months   Dorothyann Peng, NP

## 2019-08-01 NOTE — Patient Instructions (Signed)
Your A1c is 5.9 - I do not think you need metformin right now. I am going to have you stop it for the next three months and then have you follow up in August to recheck,

## 2019-09-13 ENCOUNTER — Ambulatory Visit
Admission: RE | Admit: 2019-09-13 | Discharge: 2019-09-13 | Disposition: A | Discharge status: Home / Self Care | Payer: PPO | Source: Ambulatory Visit | Attending: Neurology | Admitting: Neurology

## 2019-09-13 DIAGNOSIS — G3184 Mild cognitive impairment, so stated: Secondary | ICD-10-CM

## 2019-09-13 DIAGNOSIS — R413 Other amnesia: Secondary | ICD-10-CM | POA: Diagnosis not present

## 2019-09-13 DIAGNOSIS — R269 Unspecified abnormalities of gait and mobility: Secondary | ICD-10-CM | POA: Diagnosis not present

## 2019-09-13 DIAGNOSIS — R2681 Unsteadiness on feet: Secondary | ICD-10-CM

## 2019-09-17 ENCOUNTER — Telehealth: Payer: Self-pay

## 2019-09-17 NOTE — Telephone Encounter (Signed)
-----   Message from Cameron Sprang, MD sent at 09/17/2019 12:35 PM EDT ----- Pls let him know brain MRI did not show any evidence of tumor, stroke, or bleed. It showed age-related changes, similar to prior scan in 2017. Thanks

## 2019-09-17 NOTE — Telephone Encounter (Signed)
Spoke with pt informed him that MRI did not show any evidence of tumor, stroke, or bleed. It showed age-related changes, similar to prior scan in 2017. No questions asked, pt verbalized understanded

## 2019-11-01 ENCOUNTER — Encounter: Payer: Self-pay | Admitting: Adult Health

## 2019-11-01 ENCOUNTER — Ambulatory Visit (INDEPENDENT_AMBULATORY_CARE_PROVIDER_SITE_OTHER): Payer: PPO | Admitting: Adult Health

## 2019-11-01 ENCOUNTER — Other Ambulatory Visit: Payer: Self-pay

## 2019-11-01 VITALS — BP 110/76 | Temp 97.8°F | Wt 202.0 lb

## 2019-11-01 DIAGNOSIS — E119 Type 2 diabetes mellitus without complications: Secondary | ICD-10-CM

## 2019-11-01 LAB — POCT GLYCOSYLATED HEMOGLOBIN (HGB A1C): Hemoglobin A1C: 5.9 % — AB (ref 4.0–5.6)

## 2019-11-01 NOTE — Progress Notes (Signed)
Subjective:    Patient ID: Erik Lara, male    DOB: 1935/07/10, 84 y.o.   MRN: [id number]  HPI 84 year old male who  has a past medical history of Gallstones, Hearing loss of right ear (01/23/2017), Hepatic Cysts, Hyperlipidemia, Hyperthyroidism (01/28/2013), Mild neurocognitive disorder (09/13/2016), Prostatitis, and Type 2 diabetes mellitus without complication, without long-term current use of insulin ([place]) (03/30/2015).  He presents to the office today for three month follow up regarding DM. During his last visit his A1c was 5.9 . He was advised to stop Metformin and follow up in 3 months. He has continues to monitor his blood sugars at home and reports readings consistently in the 100-120's.He has not taken any metformin. He continues to be active and eat healthy.    Review of Systems See HPI   Past Medical History:  Diagnosis Date   Gallstones    asymptomatic   Hearing loss of right ear 01/23/2017   Hepatic Cysts    Hyperlipidemia    Hyperthyroidism 01/28/2013   Mild neurocognitive disorder 09/13/2016   Prostatitis    requiring a trip to the ER in [place] with a fever - 09   Type 2 diabetes mellitus without complication, without long-term current use of insulin ([place]) 03/30/2015    Social History   Socioeconomic History   Marital status: Married    Spouse name: Not on file   Number of children: 3   Years of education: 18   Highest education level: Master's degree (e.g., MA, MS, MEng, MEd, MSW, MBA)  Occupational History   Occupation: Retired  Tobacco Use   Smoking status: Never Smoker   Smokeless tobacco: Never Used  Scientific laboratory technician Use: Never used  Substance and Sexual Activity   Alcohol use: Yes    Alcohol/week: 0.0 standard drinks    Comment: Beer weekly/glass of wine daily   Drug use: No   Sexual activity: Not Currently    Partners: Female  Other Topics Concern   Not on file  Social History Narrative   Lives with wife       Right handed      Master's Degree from [employer]   Social Determinants of Health   Financial Resource Strain:    Difficulty of Paying Living Expenses: Not on file  Food Insecurity:    Worried About Charity fundraiser in the Last Year: Not on file   [employer] of Food in the Last Year: Not on file  Transportation Needs:    Lack of Transportation (Medical): Not on file   Lack of Transportation (Non-Medical): Not on file  Physical Activity:    Days of Exercise per Week: Not on file   Minutes of Exercise per Session: Not on file  Stress:    Feeling of Stress : Not on file  Social Connections:    Frequency of Communication with Friends and Family: Not on file   Frequency of Social Gatherings with Friends and Family: Not on file   Attends Religious Services: Not on file   Active Member of Clubs or Organizations: Not on file   Attends Archivist Meetings: Not on file   Marital Status: Not on file  Intimate Partner Violence:    Fear of Current or Ex-Partner: Not on file   Emotionally Abused: Not on file   Physically Abused: Not on file   Sexually Abused: Not on file    Past Surgical History:  Procedure Laterality Date  HERNIA REPAIR     Childhood    Family History  Problem Relation Age of Onset   Cancer Other        breast   Diabetes Other     No Known Allergies  Current Outpatient Medications on File Prior to Visit  Medication Sig Dispense Refill   aspirin 81 MG tablet Take 81 mg by mouth daily.     cetirizine (ZYRTEC) 10 MG tablet Take 10 mg by mouth daily.     donepezil (ARICEPT) 10 MG tablet Take 1 tablet (10 mg total) by mouth at bedtime. 90 tablet 3   FREESTYLE LITE test strip Use to test blood glucose once daily 100 each 3   Lancets (FREESTYLE) lancets USE ONCE DAILY 100 each 3   metFORMIN (GLUCOPHAGE) 500 MG tablet TAKE ONE-HALF TABLET BY MOUTH EVERY MORNING (Patient not taking: Reported on 08/01/2019) 45 tablet 1   simvastatin  (ZOCOR) 20 MG tablet TAKE ONE TABLET BY MOUTH EVERY NIGHT AT BEDTIME 90 tablet 2   No current facility-administered medications on file prior to visit.    Temp 97.8 F (36.6 C) (Oral)    Wt 202 lb (91.6 kg)    BMI 26.65 kg/m       Objective:   Physical Exam Vitals and nursing note reviewed.  Constitutional:      Appearance: Normal appearance.  Cardiovascular:     Rate and Rhythm: Normal rate and regular rhythm.     Pulses: Normal pulses.     Heart sounds: Normal heart sounds.  Musculoskeletal:        General: Normal range of motion.  Skin:    General: Skin is warm and dry.  Neurological:     General: No focal deficit present.     Mental Status: He is alert and oriented to person, place, and time.  Psychiatric:        Mood and Affect: Mood normal.        Behavior: Behavior normal.        Thought Content: Thought content normal.        Judgment: Judgment normal.        Assessment & Plan:  1. Type 2 diabetes mellitus without complication, without long-term current use of insulin (HCC)  - POCT glycosylated hemoglobin (Hb A1C)- 5.9  - A1c has stayed the same.  - Will d/c Metformin at this time  - Follow up at [facility], NP

## 2019-11-01 NOTE — Patient Instructions (Addendum)
It was great seeing you today   Your A1c has not changed in the last three months and continues to be 5.9 - this is great. You do not need to take the metformin at this time.   We will recheck at your physical that you can schedule for after 04/21/2020

## 2019-11-11 ENCOUNTER — Telehealth: Payer: Self-pay | Admitting: Adult Health

## 2019-11-11 NOTE — Progress Notes (Signed)
  Chronic Care Management   Outreach Note  11/11/2019 Name: Erik Lara MRN: [id number] DOB: Oct 29, 1935  Referred by: Dorothyann Peng, NP Reason for referral : No chief complaint on file.   An unsuccessful telephone outreach was attempted today. The patient was referred to the pharmacist for assistance with care management and care coordination.   Follow Up Plan:   Carley Perdue [employer] Scheduler

## 2019-11-27 ENCOUNTER — Telehealth: Payer: Self-pay | Admitting: Adult Health

## 2019-11-27 NOTE — Progress Notes (Signed)
  Chronic Care Management   Note  11/27/2019 Name: KANYON SEIBOLD MRN: [id number] DOB: July 07, 1935  JEFRY LESINSKI is a 84 y.o. year old male who is a primary care patient of Dorothyann Peng, NP. I reached out to Miguel Aschoff by phone today in response to a referral sent by Mr. Sher T Berber's PCP, Dorothyann Peng, NP.   Mr. Kalafut was given information about Chronic Care Management services today including:  1. CCM service includes personalized support from designated clinical staff supervised by his physician, including individualized plan of care and coordination with other care providers 2. 24/7 contact phone numbers for assistance for urgent and routine care needs. 3. Service will only be billed when office clinical staff spend 20 minutes or more in a month to coordinate care. 4. Only one practitioner may furnish and bill the service in a calendar month. 5. The patient may stop CCM services at any time (effective at the end of the month) by phone call to the office staff.   Patient agreed to services and verbal consent obtained.   Follow up plan:   Carley Perdue [employer] Scheduler

## 2019-12-11 ENCOUNTER — Other Ambulatory Visit: Payer: Self-pay | Admitting: Adult Health

## 2019-12-28 ENCOUNTER — Ambulatory Visit: Payer: PPO | Attending: Internal Medicine

## 2019-12-28 DIAGNOSIS — Z23 Encounter for immunization: Secondary | ICD-10-CM

## 2019-12-28 NOTE — Progress Notes (Signed)
   Covid-19 Vaccination Clinic  Name:  Erik Lara    MRN: [id number] DOB: Aug 29, 1935  12/28/2019  [name] was observed post Covid-19 immunization for 15 minutes without incident. He was provided with Vaccine Information Sheet and instruction to access the V-Safe system.   [name] was instructed to call 911 with any severe reactions post vaccine: Marland Kitchen Difficulty breathing  . Swelling of face and throat  . A fast heartbeat  . A bad rash all over body  . Dizziness and weakness

## 2020-01-01 ENCOUNTER — Other Ambulatory Visit: Payer: Self-pay | Admitting: Neurology

## 2020-01-15 ENCOUNTER — Emergency Department (HOSPITAL_COMMUNITY): Payer: PPO

## 2020-01-15 ENCOUNTER — Emergency Department (HOSPITAL_COMMUNITY)
Admission: EM | Admit: 2020-01-15 | Discharge: 2020-01-15 | Disposition: A | Discharge status: Home / Self Care | Payer: PPO | Attending: Emergency Medicine | Admitting: Emergency Medicine

## 2020-01-15 ENCOUNTER — Encounter (HOSPITAL_COMMUNITY): Payer: Self-pay

## 2020-01-15 ENCOUNTER — Other Ambulatory Visit: Payer: Self-pay

## 2020-01-15 DIAGNOSIS — S99921A Unspecified injury of right foot, initial encounter: Secondary | ICD-10-CM | POA: Diagnosis present

## 2020-01-15 DIAGNOSIS — S91211A Laceration without foreign body of right great toe with damage to nail, initial encounter: Secondary | ICD-10-CM | POA: Diagnosis not present

## 2020-01-15 DIAGNOSIS — S92421A Displaced fracture of distal phalanx of right great toe, initial encounter for closed fracture: Secondary | ICD-10-CM | POA: Diagnosis not present

## 2020-01-15 DIAGNOSIS — E119 Type 2 diabetes mellitus without complications: Secondary | ICD-10-CM | POA: Insufficient documentation

## 2020-01-15 DIAGNOSIS — Z7982 Long term (current) use of aspirin: Secondary | ICD-10-CM | POA: Diagnosis not present

## 2020-01-15 DIAGNOSIS — S92421B Displaced fracture of distal phalanx of right great toe, initial encounter for open fracture: Secondary | ICD-10-CM | POA: Insufficient documentation

## 2020-01-15 DIAGNOSIS — Y9301 Activity, walking, marching and hiking: Secondary | ICD-10-CM | POA: Insufficient documentation

## 2020-01-15 DIAGNOSIS — W231XXA Caught, crushed, jammed, or pinched between stationary objects, initial encounter: Secondary | ICD-10-CM | POA: Diagnosis not present

## 2020-01-15 MED ORDER — LIDOCAINE HCL (PF) 1 % IJ SOLN
30.0000 mL | Freq: Once | INTRAMUSCULAR | Status: AC
  Administered 2020-01-15: 30 mL
  Filled 2020-01-15: qty 30

## 2020-01-15 MED ORDER — AMOXICILLIN-POT CLAVULANATE 875-125 MG PO TABS: 1.0000 | ORAL_TABLET | Freq: Two times a day (BID) | ORAL | 0 refills | Status: DC

## 2020-01-15 MED ORDER — AMOXICILLIN-POT CLAVULANATE 875-125 MG PO TABS
1.0000 | ORAL_TABLET | Freq: Once | ORAL | Status: AC
  Administered 2020-01-15: 1 via ORAL
  Filled 2020-01-15: qty 1

## 2020-01-15 NOTE — Discharge Instructions (Addendum)
You were seen in the emergency department for evaluation of an injury to your right great toe.  Your x-ray showed a fracture that had come through the skin to cause the laceration.  This area was cleaned and reduced and sutured in place.  Hard sole shoe.  Weightbearing as tolerated.  Finish antibiotics.  Follow-up with Dr. Doran Durand in 5 to 7 days.  Return if any worsening or concerning symptoms

## 2020-01-15 NOTE — Progress Notes (Signed)
Orthopedic Tech Progress Note Patient Details:  Erik Lara 1935/04/03 [id number]  Ortho Devices Ortho Device/Splint Location: applied post op shoe Ortho Device/Splint Interventions: Ordered, Application   Post Interventions Patient Tolerated: Well Instructions Provided: Care of device   [name] 01/15/2020, 12:17 PM

## 2020-01-15 NOTE — ED Provider Notes (Signed)
[facility] DEPT Provider Note   CSN: [id number] Arrival date & time: 01/15/20  [id number]     History Chief Complaint  Patient presents with  . Toe Pain    Erik Lara is a 84 y.o. male. He is brought in for evaluation of right great toe injury. He said last night he was walking across the shag carpet and caught it and has been bleeding since then. He wrapped it up last night. He said he got out of bed and fell during the night and went back to bed and found that the toe had blood about a half a cup of blood. Not on any blood thinners. Denies any other injury.  The history is provided by the patient.  Toe Pain This is a new problem. The current episode started 6 to 12 hours ago. The problem has not changed since onset.Pertinent negatives include no chest pain, no abdominal pain, no headaches and no shortness of breath. The symptoms are aggravated by walking. Nothing relieves the symptoms. He has tried nothing for the symptoms. The treatment provided no relief.       Past Medical History:  Diagnosis Date  . Gallstones    asymptomatic  . Hearing loss of right ear 01/23/2017  . Hepatic Cysts   . Hyperlipidemia   . Hyperthyroidism 01/28/2013  . Mild neurocognitive disorder 09/13/2016  . Prostatitis    requiring a trip to the ER in [place] with a fever - 09  . Type 2 diabetes mellitus without complication, without long-term current use of insulin ([place]) 03/30/2015    Patient Active Problem List   Diagnosis Date Noted  . Hearing loss of right ear 01/23/2017  . Mild neurocognitive disorder 09/13/2016  . Type 2 diabetes mellitus without complication, without long-term current use of insulin ([place]) 03/30/2015  . Hyperthyroidism 01/28/2013  . Erectile Dysfunction 02/19/2008  . Allergic rhinitis 02/19/2008  . Hyperlipidemia 02/15/2007  . Hepatic Cyst 02/15/2007    Past Surgical History:  Procedure Laterality Date  . HERNIA REPAIR     Childhood        Family History  Problem Relation Age of Onset  . Cancer Other        breast  . Diabetes Other     Social History   Tobacco Use  . Smoking status: Never Smoker  . Smokeless tobacco: Never Used  Vaping Use  . Vaping Use: Never used  Substance Use Topics  . Alcohol use: Yes    Alcohol/week: 0.0 standard drinks    Comment: Beer weekly/glass of wine daily  . Drug use: No    Home Medications Prior to Admission medications   Medication Sig Start Date End Date Taking? Authorizing Provider  aspirin 81 MG tablet Take 81 mg by mouth daily.    [provider]  cetirizine (ZYRTEC) 10 MG tablet Take 10 mg by mouth daily.    [provider]  donepezil (ARICEPT) 10 MG tablet TAKE ONE TABLET BY MOUTH EVERY NIGHT AT BEDTIME 01/02/20   Cameron Sprang, MD  FREESTYLE LITE test strip Use to test blood glucose once daily 12/20/18   Nafziger, Tommi Rumps, NP  Lancets (FREESTYLE) lancets USE ONCE DAILY 12/19/18   Nafziger, Tommi Rumps, NP  simvastatin (ZOCOR) 20 MG tablet TAKE ONE TABLET BY MOUTH EVERY NIGHT AT BEDTIME 12/11/19   Dorothyann Peng, NP    Allergies    Patient has no known allergies.  Review of Systems   Review of Systems  Respiratory:  Negative for shortness of breath.   Cardiovascular: Negative for chest pain.  Gastrointestinal: Negative for abdominal pain.  Skin: Positive for wound.  Neurological: Negative for headaches.    Physical Exam Updated Vital Signs BP 138/76 (BP Location: Left Arm)   Pulse 94   Temp 97.7 F (36.5 C) (Oral)   Resp 16   Ht 6\' 1"  (1.854 m)   Wt 98.4 kg   SpO2 99%   BMI 28.63 kg/m   Physical Exam Vitals and nursing note reviewed.  Constitutional:      Appearance: Normal appearance. He is well-developed.  HENT:     Head: Normocephalic and atraumatic.  Eyes:     Conjunctiva/sclera: Conjunctivae normal.  Pulmonary:     Effort: Pulmonary effort is normal.  Musculoskeletal:        General: Tenderness and signs of injury present.      Cervical back: Neck supple.     Comments: Right foot and ankle nontender except for great toe. The toenail is attached. There is bleeding coming from the base of the nail. There are some ecchymosis of the great toe.  Skin:    General: Skin is warm and dry.     Capillary Refill: Capillary refill takes less than 2 seconds.  Neurological:     Mental Status: He is alert.     GCS: GCS eye subscore is 4. GCS verbal subscore is 5. GCS motor subscore is 6.       ED Results / Procedures / Treatments   Labs (all labs ordered are listed, but only abnormal results are displayed) Labs Reviewed - No data to display  EKG None  Radiology DG Toe Great Right  Result Date: 01/15/2020 CLINICAL DATA:  84 year old male with right 1st distal phalanx fracture status post reduction. EXAM: RIGHT GREAT TOE COMPARISON:  1035 hours today. FINDINGS: AP and cross-table lateral views. Transverse fracture again noted through the proximal 3rd of the right foot 1st distal phalanx. Fracture alignment now is near anatomic. No new osseous abnormality identified. IMPRESSION: Near anatomic alignment now at the 1st toe distal phalanx fracture. Electronically Signed   By: Genevie Ann M.D.   On: 01/15/2020 12:31   DG Toe Great Right  Result Date: 01/15/2020 CLINICAL DATA:  84 year old male with a history of toe injury EXAM: RIGHT GREAT TOE COMPARISON:  None. FINDINGS: Acute transverse fracture at the base of the distal phalanx of the great toe right foot with approximately 25 degrees of dorsal angulation at the fracture site. Soft tissue swelling. Soft tissue disruption at the base of the nail bed. IMPRESSION: Acute transverse open fracture involving the base of the distal phalanx of the right great toe with approximately 25 degrees of dorsal angulation. Electronically Signed   By: Corrie Mckusick D.O.   On: 01/15/2020 10:47    Procedures Procedures (including critical care time)  Medications Ordered in ED Medications   lidocaine (PF) (XYLOCAINE) 1 % injection 30 mL (30 mLs Infiltration Given 01/15/20 1222)  amoxicillin-clavulanate (AUGMENTIN) 875-125 MG per tablet 1 tablet (1 tablet Oral Given 01/15/20 1221)    ED Course  I have reviewed the triage vital signs and the nursing notes.  Pertinent labs & imaging results that were available during my care of the patient were reviewed by me and considered in my medical decision making (see chart for details).  Clinical Course as of Jan 14 1801  Wed Jan 15, 2020  1119 Discussed with orthopedics Eugenie Norrie who recommended digital block copious irrigation  stabilization and discharged on antibiotics with follow-up with Dr. Doran Durand   [MB]  1120 I performed a digital block with 1% lidocaine without epinephrine.  Orthopedics Dr Delfino Lovett came down and irrigated the wound and reduce the fracture and is suturing to stabilize.  They wanted a hard sole shoe, oral Augmentin, follow-up with Dr. Doran Durand 5 to 7 days   [MB]  1232 Repeat x-ray shows improved reduction   [MB]    Clinical Course User Index [MB] Hayden Rasmussen, MD   MDM Rules/Calculators/A&P                         Differential diagnosis includes nail avulsion, open fracture, dislocation.  Final Clinical Impression(s) / ED Diagnoses Final diagnoses:  Open displaced fracture of distal phalanx of right great toe, initial encounter    Rx / DC Orders ED Discharge Orders         Ordered    amoxicillin-clavulanate (AUGMENTIN) 875-125 MG tablet  Every 12 hours        01/15/20 1208           Hayden Rasmussen, MD 01/15/20 [phone]

## 2020-01-15 NOTE — Consult Note (Signed)
ORTHOPAEDIC CONSULTATION  REQUESTING PHYSICIAN: No att. providers found  PCP:  Dorothyann Peng, NP  Chief Complaint: Right great toe injury  HPI: Erik Lara is a 84 y.o. male who complains of right great toe injury.  Last night, before going to bed, he tripped on a shag rug and caught his great toe.  He noted deformity and bleeding.  Due to the bleeding, he put his foot in a plastic bag.  When he got up this morning, he noted that there was a half a cup of blood inside the bag.  He came to the emergency department.  X-rays of the great toe showed a comminuted extra-articular fracture to the base of the distal phalanx.  Tetanus is up-to-date.  He was given IV antibiotics.  Local anesthesia was given by the emergency department.  Orthopedic consultation was then obtained.  Patient denies other injuries.  Past Medical History:  Diagnosis Date  . Gallstones    asymptomatic  . Hearing loss of right ear 01/23/2017  . Hepatic Cysts   . Hyperlipidemia   . Hyperthyroidism 01/28/2013  . Mild neurocognitive disorder 09/13/2016  . Prostatitis    requiring a trip to the ER in [place] with a fever - 09  . Type 2 diabetes mellitus without complication, without long-term current use of insulin ([place]) 03/30/2015   Past Surgical History:  Procedure Laterality Date  . HERNIA REPAIR     Childhood   Social History   Socioeconomic History  . Marital status: Married    Spouse name: Not on file  . Number of children: 3  . Years of education: 88  . Highest education level: Master's degree (e.g., MA, MS, MEng, MEd, MSW, MBA)  Occupational History  . Occupation: Retired  Tobacco Use  . Smoking status: Never Smoker  . Smokeless tobacco: Never Used  Vaping Use  . Vaping Use: Never used  Substance and Sexual Activity  . Alcohol use: Yes    Alcohol/week: 0.0 standard drinks    Comment: Beer weekly/glass of wine daily  . Drug use: No  . Sexual activity: Not Currently    Partners: Female    Other Topics Concern  . Not on file  Social History Narrative   Lives with wife      Right handed      Master's Degree from [employer]   Social Determinants of Health   Financial Resource Strain:   . Difficulty of Paying Living Expenses: Not on file  Food Insecurity:   . Worried About Charity fundraiser in the Last Year: Not on file  . Ran Out of Food in the Last Year: Not on file  Transportation Needs:   . Lack of Transportation (Medical): Not on file  . Lack of Transportation (Non-Medical): Not on file  Physical Activity:   . Days of Exercise per Week: Not on file  . Minutes of Exercise per Session: Not on file  Stress:   . Feeling of Stress : Not on file  Social Connections:   . Frequency of Communication with Friends and Family: Not on file  . Frequency of Social Gatherings with Friends and Family: Not on file  . Attends Religious Services: Not on file  . Active Member of Clubs or Organizations: Not on file  . Attends Archivist Meetings: Not on file  . Marital Status: Not on file   Family History  Problem Relation Age of Onset  . Cancer Other  breast  . Diabetes Other    No Known Allergies Prior to Admission medications   Medication Sig Start Date End Date Taking? Authorizing Provider  amoxicillin-clavulanate (AUGMENTIN) 875-125 MG tablet Take 1 tablet by mouth every 12 (twelve) hours. 01/15/20   Hayden Rasmussen, MD  aspirin 81 MG tablet Take 81 mg by mouth daily.    [provider]  cetirizine (ZYRTEC) 10 MG tablet Take 10 mg by mouth daily.    [provider]  donepezil (ARICEPT) 10 MG tablet TAKE ONE TABLET BY MOUTH EVERY NIGHT AT BEDTIME 01/02/20   Cameron Sprang, MD  FREESTYLE LITE test strip Use to test blood glucose once daily 12/20/18   Nafziger, Tommi Rumps, NP  Lancets (FREESTYLE) lancets USE ONCE DAILY 12/19/18   Nafziger, Tommi Rumps, NP  simvastatin (ZOCOR) 20 MG tablet TAKE ONE TABLET BY MOUTH EVERY NIGHT AT BEDTIME 12/11/19   Dorothyann Peng, NP   DG Toe Great Right  Result Date: 01/15/2020 CLINICAL DATA:  84 year old male with right 1st distal phalanx fracture status post reduction. EXAM: RIGHT GREAT TOE COMPARISON:  1035 hours today. FINDINGS: AP and cross-table lateral views. Transverse fracture again noted through the proximal 3rd of the right foot 1st distal phalanx. Fracture alignment now is near anatomic. No new osseous abnormality identified. IMPRESSION: Near anatomic alignment now at the 1st toe distal phalanx fracture. Electronically Signed   By: Genevie Ann M.D.   On: 01/15/2020 12:31   DG Toe Great Right  Result Date: 01/15/2020 CLINICAL DATA:  84 year old male with a history of toe injury EXAM: RIGHT GREAT TOE COMPARISON:  None. FINDINGS: Acute transverse fracture at the base of the distal phalanx of the great toe right foot with approximately 25 degrees of dorsal angulation at the fracture site. Soft tissue swelling. Soft tissue disruption at the base of the nail bed. IMPRESSION: Acute transverse open fracture involving the base of the distal phalanx of the right great toe with approximately 25 degrees of dorsal angulation. Electronically Signed   By: Corrie Mckusick D.O.   On: 01/15/2020 10:47    Positive ROS: All other systems have been reviewed and were otherwise negative with the exception of those mentioned in the HPI and as above.  Physical Exam: General: Alert, no acute distress Cardiovascular: No pedal edema Respiratory: No cyanosis, no use of accessory musculature GI: No organomegaly, abdomen is soft and non-tender Skin: No lesions in the area of chief complaint Neurologic: Sensation intact distally Psychiatric: Patient is competent for consent with normal mood and affect Lymphatic: No axillary or cervical lymphadenopathy  MUSCULOSKELETAL: Examination of the right great toe reveals that he has a thickened nail from previous injury.  There is a laceration at the level of the proximal nail plate through the  eponychium that extends through the and the lateral nail fold both medially and laterally.  Fracture is palpable.  The toe is well-perfused.  Sensation unable to be obtained due to local block.     Assessment: Grade 2 open fracture right distal phalanx with proximal nailbed injury  Plan: I discussed the findings with the patient.  Local anesthesia was already administered by EDP.  I subluxated the fracture through the wound, and irrigated with over 50 cc of sterile saline using a bulb syringe.  The toe was then painted with Betadine.  Using iris scissors and a hemostat, I performed an excisional debridement of a small amount of necrotic skin tissue as well as a couple loose pieces of cancellous bone.  There was no gross contamination.  The fracture was then reduced with digital pressure and longitudinal traction.  I placed #3-0 simple nylon sutures in the lateral nail fold, 2 medially and 2 laterally.  The sutures provided great stability to the toe.  A sterile dressing was then applied with Xeroform, 4 x 4's, ABD, and Ace wrap.  Postop shoe was applied.  Post reduction AP and lateral x-rays showed acceptable alignment.  Patient may weight-bear as tolerated through the heel.  He will be discharged on oral Augmentin.  He will call the office to schedule a follow-up appointment with Dr. Doran Durand within 5 to 7 days.  All questions were solicited and answered.    Bertram Savin, MD [phone]    01/15/2020 1:04 PM

## 2020-01-15 NOTE — ED Triage Notes (Signed)
Patient states he stumbled and caught his right great toenail in some shag carpet last night. Patient states he has had bleeding and could not stop it. Patient states he put a plastic bag around his foot and states that he bled approx 1/2 cup blood during the night.

## 2020-01-22 DIAGNOSIS — S90221A Contusion of right lesser toe(s) with damage to nail, initial encounter: Secondary | ICD-10-CM | POA: Diagnosis not present

## 2020-01-22 DIAGNOSIS — S92424B Nondisplaced fracture of distal phalanx of right great toe, initial encounter for open fracture: Secondary | ICD-10-CM | POA: Diagnosis not present

## 2020-01-22 DIAGNOSIS — M79671 Pain in right foot: Secondary | ICD-10-CM | POA: Diagnosis not present

## 2020-01-23 DIAGNOSIS — L819 Disorder of pigmentation, unspecified: Secondary | ICD-10-CM | POA: Diagnosis not present

## 2020-01-23 DIAGNOSIS — L570 Actinic keratosis: Secondary | ICD-10-CM | POA: Diagnosis not present

## 2020-01-29 ENCOUNTER — Telehealth: Payer: Self-pay | Admitting: Pharmacist

## 2020-01-29 ENCOUNTER — Other Ambulatory Visit: Payer: Self-pay | Admitting: Adult Health

## 2020-01-29 DIAGNOSIS — E0590 Thyrotoxicosis, unspecified without thyrotoxic crisis or storm: Secondary | ICD-10-CM

## 2020-01-29 DIAGNOSIS — E119 Type 2 diabetes mellitus without complications: Secondary | ICD-10-CM

## 2020-01-29 DIAGNOSIS — E785 Hyperlipidemia, unspecified: Secondary | ICD-10-CM

## 2020-01-29 NOTE — Chronic Care Management (AMB) (Signed)
    Chronic Care Management Pharmacy Assistant   Name: Erik Lara  MRN: [id number] DOB: Oct 20, 1935  Reason for Encounter: Medication Review/Initial Questions for Pharmacist visit on 01/30/2020  Patient Questions: 1. Have you seen any other providers since your last visit? No 2. Any changes in your medications or health? No 3. Any side effects from any medications? No 4. Do you have any symptoms or problems not managed by your medications?  No 5. Any concerns about your health right now?  a. He has broken toe 6. Has your provider asked that you check blood pressure, blood sugar, or follow a special diet at home? No 7. Do you get any type of exercise regularly? No 8. Can you think of a goal you would like to reach for your health? No 9. Do you have any problems getting your medications? No  10. Is there anything that you would like to discuss during the appointment? No   PCP : Dorothyann Peng, NP  Allergies:  No Known Allergies  Medications: Outpatient Encounter Medications as of 01/29/2020  Medication Sig  . amoxicillin-clavulanate (AUGMENTIN) 875-125 MG tablet Take 1 tablet by mouth every 12 (twelve) hours.  Marland Kitchen aspirin 81 MG tablet Take 81 mg by mouth daily.  . cetirizine (ZYRTEC) 10 MG tablet Take 10 mg by mouth daily.  Marland Kitchen donepezil (ARICEPT) 10 MG tablet TAKE ONE TABLET BY MOUTH EVERY NIGHT AT BEDTIME  . FREESTYLE LITE test strip Use to test blood glucose once daily  . Lancets (FREESTYLE) lancets USE ONCE DAILY  . simvastatin (ZOCOR) 20 MG tablet TAKE ONE TABLET BY MOUTH EVERY NIGHT AT BEDTIME   No facility-administered encounter medications on file as of 01/29/2020.    Current Diagnosis: Patient Active Problem List   Diagnosis Date Noted  . Hearing loss of right ear 01/23/2017  . Mild neurocognitive disorder 09/13/2016  . Type 2 diabetes mellitus without complication, without long-term current use of insulin ([place]) 03/30/2015  . Hyperthyroidism 01/28/2013  . Erectile  Dysfunction 02/19/2008  . Allergic rhinitis 02/19/2008  . Hyperlipidemia 02/15/2007  . Hepatic Cyst 02/15/2007    Goals Addressed   None     Follow-Up:  Pharmacist Review   Maia Breslow, [place] Assistant [phone]

## 2020-01-30 ENCOUNTER — Other Ambulatory Visit: Payer: Self-pay

## 2020-01-30 ENCOUNTER — Ambulatory Visit (INDEPENDENT_AMBULATORY_CARE_PROVIDER_SITE_OTHER): Payer: PPO | Admitting: Pharmacist

## 2020-01-30 DIAGNOSIS — Z23 Encounter for immunization: Secondary | ICD-10-CM

## 2020-01-30 DIAGNOSIS — E0590 Thyrotoxicosis, unspecified without thyrotoxic crisis or storm: Secondary | ICD-10-CM

## 2020-01-30 DIAGNOSIS — E785 Hyperlipidemia, unspecified: Secondary | ICD-10-CM

## 2020-01-30 NOTE — Chronic Care Management (AMB) (Signed)
Chronic Care Management Pharmacy  Name: CALIB WADHWA  MRN: [id number] DOB: 1935/12/07  Initial Planning Appointment: completed 01/29/20  Initial Questions: 1. Have you seen any other providers since your last visit? n/a 2. Any changes in your medicines or health? No   Chief Complaint/ HPI  Erik Lara,  84 y.o. , male presents for their Initial CCM visit with the clinical pharmacist In office.  PCP : Dorothyann Peng, NP  Their chronic conditions include: DM, HLD, memory loss, allergic rhinitis, prevention of heart events  Office Visits: -11/01/19 Dorothyann Peng, NP: Patient presented for follow up for DM. A1C remained stable at 5.9%. Follow up in 6 months.  -08/01/19 Dorothyann Peng, NP: Patient presented for DM follow up. A1C improved to 5.9%. Stopped metformin and follow up in 3 months.  Consult Visit: -01/15/20 Patient presented to the emergency room with a broken toe. Prescribed Augmentin 875-125 mg BID x 7 days.  -07/24/19 Athur Pearline Cables (dermatology): Patient presented for follow up. Unable to access notes.  -07/23/19 Ellouise Newer, MD (neurology): Patient presented for follow up for memory loss and balance problems. Continue donepezil 10 mg daily. Follow up in 6 months.  Medications: Outpatient Encounter Medications as of 01/30/2020  Medication Sig  . cetirizine (ZYRTEC) 10 MG tablet Take 10 mg by mouth daily.  Marland Kitchen donepezil (ARICEPT) 10 MG tablet TAKE ONE TABLET BY MOUTH EVERY NIGHT AT BEDTIME  . FREESTYLE LITE test strip Use to test blood glucose once daily  . Lancets (FREESTYLE) lancets USE ONCE DAILY  . simvastatin (ZOCOR) 20 MG tablet TAKE ONE TABLET BY MOUTH EVERY NIGHT AT BEDTIME  . [DISCONTINUED] amoxicillin-clavulanate (AUGMENTIN) 875-125 MG tablet Take 1 tablet by mouth every 12 (twelve) hours.  . [DISCONTINUED] aspirin 81 MG tablet Take 81 mg by mouth daily.   No facility-administered encounter medications on file as of 01/30/2020.   Patient and his wife were  presented during the office visit.  Patient's concern right now is his broken toe and he is wearing a boot and is not allowed to drive. He did think about taking it off in order to drive. He has 6 weeks in the boot total and has an appointment on the 22nd of December to reassess. Patient's son lives nearby and is helping with driving. They have another son that lives in [place].  Patient reports watching a lot of TV during the day and has some concerns about memory loss currently. He has misplaced his glasses or thought they fell on the floor when they didn't. For breakfast, he eats cereal or eggs and lunch consists of a snack such as lean cuisine or leftovers.  Patient does sleep during the day some and has no trouble falling asleep or staying asleep. Him and his wife recently went to the [employer] musical at the [facility] and thoroughly enjoyed it.  Current Diagnosis/Assessment:  Goals Addressed            This Visit's Progress   . Pharmacy Care Plan       CARE PLAN ENTRY (see longitudinal plan of care for additional care plan information)  Current Barriers:  . Chronic Disease Management support, education, and care coordination needs related to Hyperlipidemia, Diabetes, Allergic Rhinitis, and memory loss, prevention of heart events  Hyperlipidemia Lab Results  Component Value Date/Time   LDLCALC 78 04/26/2018 08:58 AM   . Pharmacist Clinical Goal(s): o Over the next 180 days, patient will work with PharmD and providers to maintain LDL  goal < 100 . Current regimen:  o Simvastatin 20 mg 1 tablet at bedtime . Interventions: o Discussed lowering cholesterol through diet by: Marland Kitchen Limiting foods with cholesterol such as liver and other organ meats, egg yolks, shrimp, and whole milk dairy products . Avoiding saturated fats and trans fats and incorporating healthier fats, such as lean meat, nuts, and unsaturated oils like canola and olive oils . Eating foods with soluble  fiber such as whole-grain cereals such as oatmeal and oat bran, fruits such as apples, bananas, oranges, pears, and prunes, legumes such as kidney beans, lentils, chick peas, black-eyed peas, and lima beans, and green leafy vegetables . Limiting alcohol intake . Patient self care activities - Over the next 180 days, patient will: o Continue current medication  Diabetes Lab Results  Component Value Date/Time   HGBA1C 5.9 (A) 11/01/2019 01:40 PM   HGBA1C 5.9 08/01/2019 01:44 PM   HGBA1C 6.6 (H) 11/06/2018 09:40 AM   HGBA1C 6.5 04/26/2018 08:58 AM   . Pharmacist Clinical Goal(s): o Over the next 180 days, patient will work with PharmD and providers to maintain A1c goal <7% . Current regimen:  o No medications . Interventions: o Discussed the healthy plate method and making sustainable dietary changes . Patient self care activities - Over the next 180 days, patient will: o Check blood sugar weekly, document, and provide at future appointments o Contact provider with any episodes of hypoglycemia  Allergic rhinitis . Pharmacist Clinical Goal(s) o Over the next 180 days, patient will work with PharmD and providers to manage symptoms of allergic rhinitis . Current regimen:  o Cetirizine 10 mg 1 tablet daily . Interventions: o Discussed avoidance of allergy triggers . Patient self care activities - Over the next 180 days, patient will: o Continue current medication  Memory loss . Pharmacist Clinical Goal(s) o Over the next 180 days, patient will work with PharmD and providers to preserve memory function . Current regimen:  o Donepezil 10 mg 1 tablet daily . Interventions: o Discussed trying crossword puzzles or sudoku to help with memory . Patient self care activities - Over the next 180 days, patient will: o Continue current medication  Prevention of heart events . Pharmacist Clinical Goal(s) o Over the next 180 days, patient will work with PharmD and providers to prevent heart  events and strokes . Current regimen:  o Aspirin 81 mg 1 tablet daily . Interventions: o Discussed monitoring for signs of bleeding such as unexplained and excessive bleeding from a cut or injury, easy or excessive bruising, blood in urine or stools, and nosebleeds without a known cause . Patient self care activities - Over the next 180 days, patient will: o Continue current medication  Medication management . Pharmacist Clinical Goal(s): o Over the next 180 days, patient will work with PharmD and providers to maintain optimal medication adherence . [facility] . Interventions o Comprehensive medication review performed. o Continue current medication management strategy . Patient self care activities - Over the next 180 days, patient will: o Take medications as prescribed o Report any questions or concerns to PharmD and/or provider(s)  Initial goal documentation       SDOH Interventions     Most Recent Value  SDOH Interventions  Financial Strain Interventions Intervention Not Indicated  Transportation Interventions Intervention Not Indicated      Diabetes   A1c goal <7%  Recent Relevant Labs: Lab Results  Component Value Date/Time   HGBA1C 5.9 (A) 11/01/2019 01:40 PM  HGBA1C 5.9 08/01/2019 01:44 PM   HGBA1C 6.6 (H) 11/06/2018 09:40 AM   HGBA1C 6.5 04/26/2018 08:58 AM   GFR 110.02 04/26/2018 08:58 AM   GFR 132.65 04/18/2017 09:23 AM   MICROALBUR 0.7 04/26/2018 08:58 AM   MICROALBUR <0.7 03/28/2016 09:04 AM    Last diabetic Eye exam:  Lab Results  Component Value Date/Time   HMDIABEYEEXA No Retinopathy 04/06/2017 12:00 AM    Last diabetic Foot exam: No results found for: HMDIABFOOTEX    Checking BG: Weekly - on Wed before breakfast  Recent FBG Readings: 133, avg was closer to 120 for the past 5 weeks  Patient has failed these meds in past: metformin (no longer needed) Patient is currently controlled on the following medications: . No  medications  We discussed: diet and exercise extensively -Educated on proper foot care -Diet: eats some peanut butter crackers but endorses a "balanced diet" and eats veggies and occasional cheeseburgers -Encouraged ideal healthy plate model and eating carbs in moderation   Plan  Continue control with diet and exercise   Hyperlipidemia   LDL goal < 100  Last lipids Lab Results  Component Value Date   CHOL 139 04/26/2018   HDL 43.50 04/26/2018   LDLCALC 78 04/26/2018   TRIG 86.0 04/26/2018   CHOLHDL 3 04/26/2018   Hepatic Function Latest Ref Rng & Units 04/26/2018 04/18/2017 03/28/2016  Total Protein 6.0 - 8.3 g/dL 6.1 6.5 6.3  Albumin 3.5 - 5.2 g/dL 4.0 3.9 4.0  AST 0 - 37 U/L 21 14 15  ALT 0 - 53 U/L 20 16 17  Alk Phosphatase 39 - 117 U/L 71 75 76  Total Bilirubin 0.2 - 1.2 mg/dL 1.1 1.3(H) 1.1  Bilirubin, Direct 0.0 - 0.3 mg/dL - 0.3 0.2     The ASCVD Risk score (Goff DC Jr., et al., 2013) failed to calculate for the following reasons:   The 2013 ASCVD risk score is only valid for ages 40 to 79   Patient has failed these meds in past: none Patient is currently controlled on the following medications:  . Simvastatin 20 mg 1 tablet at bedtime  We discussed:  diet and exercise extensively  -Lowering cholesterol through diet by: . Limiting foods with cholesterol such as liver and other organ meats, egg yolks, shrimp, and whole milk dairy products . Avoiding saturated fats and trans fats and incorporating healthier fats, such as lean meat, nuts, and unsaturated oils like canola and olive oils . Eating foods with soluble fiber such as whole-grain cereals such as oatmeal and oat bran, fruits such as apples, bananas, oranges, pears, and prunes, legumes such as kidney beans, lentils, chick peas, black-eyed peas, and lima beans, and green leafy vegetables . Limiting alcohol intake -Exercise: balance is off - recommended yoga on youtube or videos (tried one time but difficulty with  memory)  Plan Recommend repeat lipid panel as patient is overdue. Continue current medications  Memory loss   Patient has failed these meds in past: none Patient is currently controlled on the following medications:  . Donepezil 10 mg 1 tablet daily  We discussed:  Recommended trying soduku or crossword puzzles to help with memory  Plan  Continue current medications   Allergic rhinitis   Patient has failed these meds in past: none Patient is currently controlled on the following medications:  . Cetirizine 10 mg 1 tablet daily at night   Plan  Continue current medications   Primary prevention of ASCVD   Patient has   failed these meds in past: none Patient is currently controlled on the following medications:  . Aspirin 81 mg 1 tablet daily  We discussed:  Monitoring for signs of bleeding such as unexplained and excessive bleeding from a cut or injury, easy or excessive bruising, blood in urine or stools, and nosebleeds without a known cause  Plan Will message PCP about stopping aspirin therapy. Continue current medications   Vaccines   Reviewed and discussed patient's vaccination history.    Immunization History  Administered Date(s) Administered  . Fluad Quad(high Dose 65+) 01/30/2020  . Influenza Split 01/17/2013  . Influenza Whole 12/13/2006, 12/12/2008, 12/12/2009  . Influenza, High Dose Seasonal PF 01/11/2014  . Influenza-Unspecified 12/25/2014, 12/31/2016  . PFIZER SARS-COV-2 Vaccination 04/27/2019, 05/20/2019, 12/28/2019  . Pneumococcal Conjugate-13 01/27/2014  . Pneumococcal Polysaccharide-23 03/14/2005  . Td 03/15/2003  . Tetanus 01/27/2014   Patient received influenza immunization during office visit.   Plan  Recommended patient receive Shingles vaccine at pharmacy.   Medication Management   Pt uses [facility] pharmacy for all medications Uses pill box? No - has own system Pt endorses 100% compliance  We discussed: Current pharmacy is  preferred with insurance plan and patient is satisfied with pharmacy services  Plan  Continue current medication management strategy   Follow up: 6 month phone visit  Madeline , PharmD Clinical Pharmacist [facility] [place] [phone]   

## 2020-02-04 ENCOUNTER — Ambulatory Visit: Payer: PPO | Admitting: Neurology

## 2020-02-11 ENCOUNTER — Other Ambulatory Visit: Payer: Self-pay | Admitting: Neurology

## 2020-02-11 NOTE — Patient Instructions (Addendum)
Hi Erik Lara,  It was lovely to get to meet you and your wife in the office! Below is a summary of some of the things we discussed. I do think it would be helpful for your balance to try yoga again or some form of stretching and to do crossword puzzles or sudoku or anything similar to challenge yourself and your memory.   Please give me a call if you have any questions or need anything before our follow up in 6 months and call me if you would rather come in person or do it over the telephone!  Best, Maddie  Jeni Salles, PharmD [facility] Clinical Pharmacist [facility] [place]   Visit Information  Goals Addressed            This Visit's Progress   . Pharmacy Care Plan       CARE PLAN ENTRY (see longitudinal plan of care for additional care plan information)  Current Barriers:  . Chronic Disease Management support, education, and care coordination needs related to Hyperlipidemia, Diabetes, Allergic Rhinitis, and memory loss, prevention of heart events  Hyperlipidemia Lab Results  Component Value Date/Time   LDLCALC 78 04/26/2018 08:58 AM   . Pharmacist Clinical Goal(s): o Over the next 180 days, patient will work with PharmD and providers to maintain LDL goal < 100 . Current regimen:  o Simvastatin 20 mg 1 tablet at bedtime . Interventions: o Discussed lowering cholesterol through diet by: Marland Kitchen Limiting foods with cholesterol such as liver and other organ meats, egg yolks, shrimp, and whole milk dairy products . Avoiding saturated fats and trans fats and incorporating healthier fats, such as lean meat, nuts, and unsaturated oils like canola and olive oils . Eating foods with soluble fiber such as whole-grain cereals such as oatmeal and oat bran, fruits such as apples, bananas, oranges, pears, and prunes, legumes such as kidney beans, lentils, chick peas, black-eyed peas, and lima beans, and green leafy vegetables . Limiting alcohol intake . Patient self  care activities - Over the next 180 days, patient will: o Continue current medication  Diabetes Lab Results  Component Value Date/Time   HGBA1C 5.9 (A) 11/01/2019 01:40 PM   HGBA1C 5.9 08/01/2019 01:44 PM   HGBA1C 6.6 (H) 11/06/2018 09:40 AM   HGBA1C 6.5 04/26/2018 08:58 AM   . Pharmacist Clinical Goal(s): o Over the next 180 days, patient will work with PharmD and providers to maintain A1c goal <7% . Current regimen:  o No medications . Interventions: o Discussed the healthy plate method and making sustainable dietary changes . Patient self care activities - Over the next 180 days, patient will: o Check blood sugar weekly, document, and provide at future appointments o Contact provider with any episodes of hypoglycemia  Allergic rhinitis . Pharmacist Clinical Goal(s) o Over the next 180 days, patient will work with PharmD and providers to manage symptoms of allergic rhinitis . Current regimen:  o Cetirizine 10 mg 1 tablet daily . Interventions: o Discussed avoidance of allergy triggers . Patient self care activities - Over the next 180 days, patient will: o Continue current medication  Memory loss . Pharmacist Clinical Goal(s) o Over the next 180 days, patient will work with PharmD and providers to preserve memory function . Current regimen:  o Donepezil 10 mg 1 tablet daily . Interventions: o Discussed trying crossword puzzles or sudoku to help with memory . Patient self care activities - Over the next 180 days, patient will: o Continue current medication  Prevention of heart events . Pharmacist Clinical Goal(s) o Over the next 180 days, patient will work with PharmD and providers to prevent heart events and strokes . Current regimen:  o Aspirin 81 mg 1 tablet daily . Interventions: o Discussed monitoring for signs of bleeding such as unexplained and excessive bleeding from a cut or injury, easy or excessive bruising, blood in urine or stools, and nosebleeds without a  known cause . Patient self care activities - Over the next 180 days, patient will: o Continue current medication  Medication management . Pharmacist Clinical Goal(s): o Over the next 180 days, patient will work with PharmD and providers to maintain optimal medication adherence . [facility] . Interventions o Comprehensive medication review performed. o Continue current medication management strategy . Patient self care activities - Over the next 180 days, patient will: o Take medications as prescribed o Report any questions or concerns to PharmD and/or provider(s)  Initial goal documentation        Erik Lara was given information about Chronic Care Management services today including:  1. CCM service includes personalized support from designated clinical staff supervised by his physician, including individualized plan of care and coordination with other care providers 2. 24/7 contact phone numbers for assistance for urgent and routine care needs. 3. Standard insurance, coinsurance, copays and deductibles apply for chronic care management only during months in which we provide at least 20 minutes of these services. Most insurances cover these services at 100%, however patients may be responsible for any copay, coinsurance and/or deductible if applicable. This service may help you avoid the need for more expensive face-to-face services. 4. Only one practitioner may furnish and bill the service in a calendar month. 5. The patient may stop CCM services at any time (effective at the end of the month) by phone call to the office staff.  Patient agreed to services and verbal consent obtained.   The patient verbalized understanding of instructions, educational materials, and care plan provided today and agreed to receive a mailed copy of patient instructions, educational materials, and care plan.  Telephone follow up appointment with pharmacy team member scheduled for: 6  months

## 2020-02-18 ENCOUNTER — Other Ambulatory Visit: Payer: Self-pay | Admitting: Adult Health

## 2020-02-18 DIAGNOSIS — E0590 Thyrotoxicosis, unspecified without thyrotoxic crisis or storm: Secondary | ICD-10-CM

## 2020-02-18 DIAGNOSIS — E785 Hyperlipidemia, unspecified: Secondary | ICD-10-CM

## 2020-03-04 DIAGNOSIS — E1140 Type 2 diabetes mellitus with diabetic neuropathy, unspecified: Secondary | ICD-10-CM | POA: Diagnosis not present

## 2020-03-04 DIAGNOSIS — E119 Type 2 diabetes mellitus without complications: Secondary | ICD-10-CM | POA: Diagnosis not present

## 2020-03-04 DIAGNOSIS — S92424B Nondisplaced fracture of distal phalanx of right great toe, initial encounter for open fracture: Secondary | ICD-10-CM | POA: Diagnosis not present

## 2020-04-03 ENCOUNTER — Other Ambulatory Visit: Payer: Self-pay

## 2020-04-03 MED ORDER — DONEPEZIL HCL 10 MG PO TABS
10.0000 mg | ORAL_TABLET | Freq: Every day | ORAL | 0 refills | Status: DC
Start: 2020-04-03 — End: 2020-04-28

## 2020-04-03 NOTE — Progress Notes (Signed)
Ok to send, thanks

## 2020-04-22 ENCOUNTER — Other Ambulatory Visit: Payer: Self-pay

## 2020-04-23 ENCOUNTER — Ambulatory Visit (INDEPENDENT_AMBULATORY_CARE_PROVIDER_SITE_OTHER): Payer: PPO | Admitting: Adult Health

## 2020-04-23 ENCOUNTER — Encounter: Payer: Self-pay | Admitting: Adult Health

## 2020-04-23 VITALS — BP 100/68 | Temp 98.1°F | Wt 192.0 lb

## 2020-04-23 DIAGNOSIS — E119 Type 2 diabetes mellitus without complications: Secondary | ICD-10-CM | POA: Diagnosis not present

## 2020-04-23 DIAGNOSIS — G3184 Mild cognitive impairment, so stated: Secondary | ICD-10-CM

## 2020-04-23 DIAGNOSIS — R2681 Unsteadiness on feet: Secondary | ICD-10-CM

## 2020-04-23 DIAGNOSIS — E785 Hyperlipidemia, unspecified: Secondary | ICD-10-CM

## 2020-04-23 DIAGNOSIS — Z0000 Encounter for general adult medical examination without abnormal findings: Secondary | ICD-10-CM | POA: Diagnosis not present

## 2020-04-23 LAB — COMPREHENSIVE METABOLIC PANEL
ALT: 19 U/L (ref 0–53)
AST: 20 U/L (ref 0–37)
Albumin: 4 g/dL (ref 3.5–5.2)
Alkaline Phosphatase: 71 U/L (ref 39–117)
BUN: 12 mg/dL (ref 6–23)
CO2: 29 mEq/L (ref 19–32)
Calcium: 9.7 mg/dL (ref 8.4–10.5)
Chloride: 104 mEq/L (ref 96–112)
Creatinine, Ser: 0.69 mg/dL (ref 0.40–1.50)
GFR: 85.84 mL/min (ref >60.00)
Glucose, Bld: 111 mg/dL — ABNORMAL HIGH (ref 70–99)
Potassium: 4.3 mEq/L (ref 3.5–5.1)
Sodium: 139 mEq/L (ref 135–145)
Total Bilirubin: 1.6 mg/dL — ABNORMAL HIGH (ref 0.2–1.2)
Total Protein: 6.2 g/dL (ref 6.0–8.3)

## 2020-04-23 LAB — CBC WITH DIFFERENTIAL/PLATELET
Basophils Absolute: 0 10*3/uL (ref 0.0–0.1)
Basophils Relative: 0.6 % (ref 0.0–3.0)
Eosinophils Absolute: 0.1 10*3/uL (ref 0.0–0.7)
Eosinophils Relative: 1.8 % (ref 0.0–5.0)
HCT: 41.8 % (ref 39.0–52.0)
Hemoglobin: 14.1 g/dL (ref 13.0–17.0)
Lymphocytes Relative: 21.2 % (ref 12.0–46.0)
Lymphs Abs: 1.1 10*3/uL (ref 0.7–4.0)
MCHC: 33.7 g/dL (ref 30.0–36.0)
MCV: 88.1 fl (ref 78.0–100.0)
Monocytes Absolute: 0.5 10*3/uL (ref 0.1–1.0)
Monocytes Relative: 9.3 % (ref 3.0–12.0)
Neutro Abs: 3.6 10*3/uL (ref 1.4–7.7)
Neutrophils Relative %: 67.1 % (ref 43.0–77.0)
Platelets: 209 10*3/uL (ref 150.0–400.0)
RBC: 4.75 Mil/uL (ref 4.22–5.81)
RDW: 13.5 % (ref 11.5–15.5)
WBC: 5.3 10*3/uL (ref 4.0–10.5)

## 2020-04-23 LAB — MICROALBUMIN / CREATININE URINE RATIO
Creatinine,U: 154.1 mg/dL
Microalb Creat Ratio: 0.9 mg/g (ref 0.0–30.0)
Microalb, Ur: 1.4 mg/dL (ref 0.0–1.9)

## 2020-04-23 LAB — LIPID PANEL
Cholesterol: 115 mg/dL (ref 0–200)
HDL: 47.9 mg/dL (ref >39.00)
LDL Cholesterol: 53 mg/dL (ref 0–99)
NonHDL: 66.87
Total CHOL/HDL Ratio: 2
Triglycerides: 69 mg/dL (ref 0.0–149.0)
VLDL: 13.8 mg/dL (ref 0.0–40.0)

## 2020-04-23 LAB — TSH: TSH: 0.25 u[IU]/mL — ABNORMAL LOW (ref 0.35–4.50)

## 2020-04-23 LAB — HEMOGLOBIN A1C: Hgb A1c MFr Bld: 6.1 % (ref 4.6–6.5)

## 2020-04-23 NOTE — Addendum Note (Signed)
Addended by: Marrion Coy on: 04/23/2020 09:50 AM   Modules accepted: Orders

## 2020-04-23 NOTE — Progress Notes (Signed)
Subjective:    Patient ID: Erik Lara, male    DOB: [birth year], 85 y.o.   MRN: [id number]  HPI Patient presents for yearly preventative medicine examination. He is a pleasant 85 year old male who  has a past medical history of Gallstones, Hearing loss of right ear (01/23/2017), Hepatic Cysts, Hyperlipidemia, Hyperthyroidism (01/28/2013), Mild neurocognitive disorder (09/13/2016), Prostatitis, and Type 2 diabetes mellitus without complication, without long-term current use of insulin ([place]) (03/30/2015).  DM -on Metformin in the past but this was discontinued in May 2021.  He does stay very active and eats healthy.  Consistently monitors his blood sugars at home with readings in the 100s to 120s.  Lab Results  Component Value Date   HGBA1C 5.9 (A) 11/01/2019   Hyperlipidemia-prescribed Zocor and aspirin.  He denies myalgia or fatigue Lab Results  Component Value Date   CHOL 139 04/26/2018   HDL 43.50 04/26/2018   LDLCALC 78 04/26/2018   TRIG 86.0 04/26/2018   CHOLHDL 3 04/26/2018   Cognitive Impairment -followed by neurology, currently prescribed Aricept 10 mg.  Neuropsych testing in January 14, 2019 showed mild to moderate frontal-subcortical dysfunction that is typically seen with vascular neurocognitive disorder.  He did have a repeat MRI in July 2021 that showed no acute intracranial abnormality.  Stable and largely unremarkable for age MRI. He continues to feel as though his memory is " failing". He is spending a lot of time looking for things around the house. He has an upcoming appointment with Neurology next week.   Gait Instability - reports that he has had one fall this year without injury. Feels as though his gait is off but still gets around the house ok, especially when climbing his chairs.   All immunizations and health maintenance protocols were reviewed with the patient and needed orders were placed.  Appropriate screening laboratory values were ordered for the patient  including screening of hyperlipidemia, renal function and hepatic function.  Medication reconciliation,  past medical history, social history, problem list and allergies were reviewed in detail with the patient  Goals were established with regard to weight loss, exercise, and  diet in compliance with medications  End of life planning was discussed.  He has no acute complaints.    Review of Systems  Constitutional: Negative.   HENT: Negative.   Eyes: Negative.   Respiratory: Negative.   Cardiovascular: Negative.   Gastrointestinal: Negative.   Endocrine: Negative.   Genitourinary: Negative.   Musculoskeletal: Positive for gait problem.  Skin: Negative.   Allergic/Immunologic: Negative.   Hematological: Negative.   Psychiatric/Behavioral: Positive for confusion.  All other systems reviewed and are negative.  Past Medical History:  Diagnosis Date  . Gallstones    asymptomatic  . Hearing loss of right ear 01/23/2017  . Hepatic Cysts   . Hyperlipidemia   . Hyperthyroidism 01/28/2013  . Mild neurocognitive disorder 09/13/2016  . Prostatitis    requiring a trip to the ER in [place] with a fever - 09  . Type 2 diabetes mellitus without complication, without long-term current use of insulin ([place]) 03/30/2015    Social History   Socioeconomic History  . Marital status: Married    Spouse name: Not on file  . Number of children: 3  . Years of education: 46  . Highest education level: Master's degree (e.g., MA, MS, MEng, MEd, MSW, MBA)  Occupational History  . Occupation: Retired  Tobacco Use  . Smoking status: Never Smoker  . Smokeless tobacco:  Never Used  Vaping Use  . Vaping Use: Never used  Substance and Sexual Activity  . Alcohol use: Yes    Alcohol/week: 0.0 standard drinks    Comment: Beer weekly/glass of wine daily  . Drug use: No  . Sexual activity: Not Currently    Partners: Female  Other Topics Concern  . Not on file  Social History Narrative   Lives  with wife      Right handed      Master's Degree from [employer]   Social Determinants of Health   Financial Resource Strain: Low Risk   . Difficulty of Paying Living Expenses: Not hard at all  Food Insecurity: Not on file  Transportation Needs: No Transportation Needs  . Lack of Transportation (Medical): No  . Lack of Transportation (Non-Medical): No  Physical Activity: Not on file  Stress: Not on file  Social Connections: Not on file  Intimate Partner Violence: Not on file    Past Surgical History:  Procedure Laterality Date  . HERNIA REPAIR     Childhood    Family History  Problem Relation Age of Onset  . Cancer Other        breast  . Diabetes Other     No Known Allergies  Current Outpatient Medications on File Prior to Visit  Medication Sig Dispense Refill  . cetirizine (ZYRTEC) 10 MG tablet Take 10 mg by mouth daily.    Marland Kitchen donepezil (ARICEPT) 10 MG tablet Take 1 tablet (10 mg total) by mouth at bedtime. 90 tablet 0  . FREESTYLE LITE test strip Use to test blood glucose once daily 100 each 3  . Lancets (FREESTYLE) lancets USE ONCE DAILY 100 each 3  . simvastatin (ZOCOR) 20 MG tablet TAKE ONE TABLET BY MOUTH EVERY NIGHT AT BEDTIME 90 tablet 2   No current facility-administered medications on file prior to visit.    BP 100/68   Temp 98.1 F (36.7 C)   Wt 192 lb (87.1 kg)   BMI 25.33 kg/m       Objective:   Physical Exam Vitals and nursing note reviewed.  Constitutional:      General: He is not in acute distress.    Appearance: Normal appearance. He is well-developed and normal weight.  HENT:     Head: Normocephalic and atraumatic.     Right Ear: Tympanic membrane, ear canal and external ear normal. There is no impacted cerumen.     Left Ear: Tympanic membrane, ear canal and external ear normal. There is no impacted cerumen.     Nose: Nose normal. No congestion or rhinorrhea.     Mouth/Throat:     Mouth: Mucous membranes are moist.     Pharynx: Oropharynx  is clear. No oropharyngeal exudate or posterior oropharyngeal erythema.  Eyes:     General:        Right eye: No discharge.        Left eye: No discharge.     Extraocular Movements: Extraocular movements intact.     Conjunctiva/sclera: Conjunctivae normal.     Pupils: Pupils are equal, round, and reactive to light.  Neck:     Vascular: No carotid bruit.     Trachea: No tracheal deviation.  Cardiovascular:     Rate and Rhythm: Normal rate and regular rhythm.     Pulses: Normal pulses.     Heart sounds: Normal heart sounds. No murmur heard. No friction rub. No gallop.   Pulmonary:     Effort: Pulmonary  effort is normal. No respiratory distress.     Breath sounds: Normal breath sounds. No stridor. No wheezing, rhonchi or rales.  Chest:     Chest wall: No tenderness.  Abdominal:     General: Bowel sounds are normal. There is no distension.     Palpations: Abdomen is soft. There is no mass.     Tenderness: There is no abdominal tenderness. There is no right CVA tenderness, left CVA tenderness, guarding or rebound.     Hernia: No hernia is present.  Musculoskeletal:        General: No swelling, tenderness, deformity or signs of injury. Normal range of motion.     Right lower leg: No edema.     Left lower leg: No edema.  Lymphadenopathy:     Cervical: No cervical adenopathy.  Skin:    General: Skin is warm and dry.     Capillary Refill: Capillary refill takes less than 2 seconds.     Coloration: Skin is not jaundiced or pale.     Findings: No bruising, erythema, lesion or rash.  Neurological:     General: No focal deficit present.     Mental Status: He is alert and oriented to person, place, and time.     Cranial Nerves: No cranial nerve deficit.     Sensory: No sensory deficit.     Motor: No weakness.     Coordination: Coordination normal.     Gait: Gait abnormal (slow steady gait ).     Deep Tendon Reflexes: Reflexes normal.     Comments: Was able to get on exam table without  assistance    Psychiatric:        Mood and Affect: Mood normal.        Behavior: Behavior normal.        Thought Content: Thought content normal.        Judgment: Judgment normal.       Assessment & Plan:  1. Routine general medical examination at a health care facility  - CBC with Differential/Platelet; Future - Comprehensive metabolic panel; Future - Hemoglobin A1c; Future - Lipid panel; Future - TSH; Future  2. Hyperlipidemia, unspecified hyperlipidemia type - Consider increase in statin  - CBC with Differential/Platelet; Future - Comprehensive metabolic panel; Future - Hemoglobin A1c; Future - Lipid panel; Future - TSH; Future  3. Type 2 diabetes mellitus without complication, without long-term current use of insulin ([place]) - Consider adding back Metformin  - CBC with Differential/Platelet; Future - Comprehensive metabolic panel; Future - Hemoglobin A1c; Future - Lipid panel; Future - TSH; Future - Microalbumin, urine; Future  4. Mild neurocognitive disorder - Has an upcoming appointment with neurology. No cognitive deficients acknowledged today  - CBC with Differential/Platelet; Future - Comprehensive metabolic panel; Future - Hemoglobin A1c; Future - Lipid panel; Future - TSH; Future  5. Gait instability - Refuses PT  - Encouraged to use precautions at home and when out to minimize falls   Dorothyann Peng, NP

## 2020-04-23 NOTE — Addendum Note (Signed)
Addended by: Marrion Coy on: 04/23/2020 09:49 AM   Modules accepted: Orders

## 2020-04-23 NOTE — Patient Instructions (Signed)
Erik Lara,   It was great seeing you today   Please continue to stay active and eat healthy   We will follow up with you regarding your blood work   I will see you back in 6 months for diabetic check

## 2020-04-28 ENCOUNTER — Other Ambulatory Visit: Payer: Self-pay

## 2020-04-28 ENCOUNTER — Encounter: Payer: Self-pay | Admitting: Neurology

## 2020-04-28 ENCOUNTER — Ambulatory Visit: Payer: PPO | Admitting: Neurology

## 2020-04-28 VITALS — BP 130/71 | HR 62 | Ht 73.0 in | Wt 191.8 lb

## 2020-04-28 DIAGNOSIS — R2681 Unsteadiness on feet: Secondary | ICD-10-CM | POA: Diagnosis not present

## 2020-04-28 DIAGNOSIS — G20 Parkinson's disease: Secondary | ICD-10-CM | POA: Diagnosis not present

## 2020-04-28 DIAGNOSIS — G3184 Mild cognitive impairment, so stated: Secondary | ICD-10-CM

## 2020-04-28 MED ORDER — ESCITALOPRAM OXALATE 10 MG PO TABS: 10.0000 mg | ORAL_TABLET | Freq: Every day | ORAL | 11 refills | Status: DC

## 2020-04-28 MED ORDER — DONEPEZIL HCL 10 MG PO TABS
10.0000 mg | ORAL_TABLET | Freq: Every day | ORAL | 3 refills | Status: DC
Start: 2020-04-28 — End: 2020-10-13

## 2020-04-28 NOTE — Patient Instructions (Signed)
1. Continue Donepezil 10mg  daily  2. Start Lexapro 10mg  daily  3. Refer to Physical Therapy  4. Follow-up in 6 months, call for any changes   FALL PRECAUTIONS: Be cautious when walking. Scan the area for obstacles that may increase the risk of trips and falls. When getting up in the mornings, sit up at the edge of the bed for a few minutes before getting out of bed. Consider elevating the bed at the head end to avoid drop of blood pressure when getting up. Walk always in a well-lit room (use night lights in the walls). Avoid area rugs or power cords from appliances in the middle of the walkways. Use a walker or a cane if necessary and consider physical therapy for balance exercise. Get your eyesight checked regularly.  FINANCIAL OVERSIGHT: Supervision, especially oversight when making financial decisions or transactions is also recommended.  HOME SAFETY: Consider the safety of the kitchen when operating appliances like stoves, microwave oven, and blender. Consider having supervision and share cooking responsibilities until no longer able to participate in those. Accidents with firearms and other hazards in the house should be identified and addressed as well.  DRIVING: Regarding driving, in patients with progressive memory problems, driving will be impaired. We advise to have someone else do the driving if trouble finding directions or if minor accidents are reported. Independent driving assessment is available to determine safety of driving.  ABILITY TO BE LEFT ALONE: If patient is unable to contact 911 operator, consider using LifeLine, or when the need is there, arrange for someone to stay with patients. Smoking is a fire hazard, consider supervision or cessation. Risk of wandering should be assessed by caregiver and if detected at any point, supervision and safe proof recommendations should be instituted.  MEDICATION SUPERVISION: Inability to self-administer medication needs to be constantly  addressed. Implement a mechanism to ensure safe administration of the medications.  RECOMMENDATIONS FOR ALL PATIENTS WITH MEMORY PROBLEMS: 1. Continue to exercise (Recommend 30 minutes of walking everyday, or 3 hours every week) 2. Increase social interactions - continue going to [place] and enjoy social gatherings with friends and family 3. Eat healthy, avoid fried foods and eat more fruits and vegetables 4. Maintain adequate blood pressure, blood sugar, and blood cholesterol level. Reducing the risk of stroke and cardiovascular disease also helps promoting better memory. 5. Avoid stressful situations. Live a simple life and avoid aggravations. Organize your time and prepare for the next day in anticipation. 6. Sleep well, avoid any interruptions of sleep and avoid any distractions in the bedroom that may interfere with adequate sleep quality 7. Avoid sugar, avoid sweets as there is a strong link between excessive sugar intake, diabetes, and cognitive impairment The Mediterranean diet has been shown to help patients reduce the risk of progressive memory disorders and reduces cardiovascular risk. This includes eating fish, eat fruits and green leafy vegetables, nuts like almonds and hazelnuts, walnuts, and also use olive oil. Avoid fast foods and fried foods as much as possible. Avoid sweets and sugar as sugar use has been linked to worsening of memory function.  There is always a concern of gradual progression of memory problems. If this is the case, then we may need to adjust level of care according to patient needs. Support, both to the patient and caregiver, should then be put into place.

## 2020-04-28 NOTE — Progress Notes (Signed)
NEUROLOGY FOLLOW UP OFFICE NOTE  Erik Lara [id number] [birth year]  HISTORY OF PRESENT ILLNESS: I had the pleasure of seeing Erik Lara in follow-up in the neurology clinic on 04/28/2020. He is accompanied by his wife who helps supplement the history today. The patient was last seen 9 months ago for worsening memory. He has had 3 Neuropsychological evaluations in 2018, 2019, and most recently in 01/2019 which showed mild to moderate frontal-subcortical dysfunction with primary deficits in processing speed, encoding and retrieval aspects of verbal memory, working memory, verbal fluency, and some aspects of cognitive flexibility. Compared to prior testing, there was notable decline across processing speed, and more mild declines across other domins. He continued to meet criteria for Mild neurocognitive disorder, etiology unclear, profile is consistent with what is seen in a vascular neurocognitive disorder, however brain imaging is not congruent. Alzheimer's appears unlikely.   He was late for his prior appointment in November and expressed his frustration with our late policy, stating he had marked changes in his memory at that time, that has been stable since then. He states that unless he concentrates, he would not remember information. He has missed some turns driving and does not drive at night. He manages his own medications and denies any issues. He has more problems with managing finances, he has stacks of things that he is not sure if they have been paid or not. He now has help with his taxes, but has a hard time gathering all the information needed from him. His wife helps mainly because of hand tremors, he writes checks but his handwriting would trail off. He is noted to get irritated at times with his wife during the visit, he states that he has been working on something at home and "no one really appreciates it." When asked what this was, he states it is gathering information for their  tax returns. He also expressed frustration that his son and wife planned a visit without asking for his opinion and that he has to make all the preparations at home for their 2-day visit from [place]. Sleep is good, no REM behavior disorder. He admits to occasional visual illusions, he would see something related to an object but it would not be what he was thinking. For instance he was talking to the blanket folded a certain way seeing his wife's face on it, but she would be in another room. At one point he was having these images a couple of times a day, but it has gotten better the past month, happening a couple of times a week. Sometimes there is an auditory component, he thinks his wife calls him for supper but she did not. His wife notes that he has stacks of paper all around his desk, which is kind of a hazard. He tripped 1-2 months ago. He has cramps in his feet almost every morning and walks it off. His wife reports he sits around all day, he states he is not given credit that going up the stairs is exercise. He is on Donepezil 10mg  daily without side effects.   I personally reviewed MRI brain without contrast done 09/2019 which did not show any acute changes. There was mild to moderate diffuse atrophy, no significant white matter disease.   History on Initial Assessment 03/30/2015: This is a pleasant 85 yo RH man with a history of hyperlipidemia, diabetes, who presented for memory loss. He started noticing changes around 1-1/2 years ago, he would notice his memory would be "  spotty," with a blank spot occurring once in a while. He reported this to his PCP, then again on his next follow-up visit last November 2016. He was concerned about an episode [place] 2 [place] a familiar place, when he could not recall which exit to take. He missed the exit and had to turn around. He has noticed moments while driving where he briefly has a blank spot ("new garden or spring garden")  then quickly passes. He has been told by his wife that he repeats himself. He has always had word-finding difficulties, but noticed this has worsened. He infrequently misses bill payments, stating he has a record system, but would miss paying if he gets very busy. He denies any missed medications. He denies any family history of dementia, history of head injury or alcohol use.  He has had poor sense of smell for many years. He has noticed some difficulty reading from afar, better if he looks closer. He has occasional cramping in the calf muscles, left>right, worse at night. He denies any falls. He goes to exercise class 3-4 times a week.   Diagnostic Data: MRI brain without contrast done 04/14/15 did not show any acute changes. There was moderate cerebral atrophy and minimal chronic microvascular disease. Neuropsychological evaluation in 2018 was largely within normal limits. There were a few areas of impairment of mild fronto-subcortical dysfunction. Testing did not warrant a diagnosis of dementia, but mild cognitive impairment was appropriate at this time. There is a longstanding history of attention/executive functioning difficulties (such as ADHD) based on self-report.  Neuropsychological testing in June 2019: Impression of Non-amnestic MCI (stable from 2018). There was no evidence of an underlying memory disorder or Alzheimer's disease. He reported improved anxiety.  Neuropsychological testing in 01/2019: "He continues to meet criteria for a Mild Neurocognitive Disorder (formerly "mild cognitive impairment") at the present time. The etiology of these deficits remains unclear. I share Dr. Marcia Lara surprise that Erik Lara does not display more advanced small vessel ischemic disease given that his cognitive profile is consistent with what would be expected in a vascular neurocognitive disorder. Given intact visual learning and memory, coupled with the relative stability of much of Erik Lara's cognitive  profile, Alzheimer's disease appears unlikely at the present time."   PAST MEDICAL HISTORY: Past Medical History:  Diagnosis Date  . Gallstones    asymptomatic  . Hearing loss of right ear 01/23/2017  . Hepatic Cysts   . Hyperlipidemia   . Hyperthyroidism 01/28/2013  . Mild neurocognitive disorder 09/13/2016  . Prostatitis    requiring a trip to the ER in [place] with a fever - 09  . Type 2 diabetes mellitus without complication, without long-term current use of insulin ([place]) 03/30/2015    MEDICATIONS: Current Outpatient Medications on File Prior to Visit  Medication Sig Dispense Refill  . cetirizine (ZYRTEC) 10 MG tablet Take 10 mg by mouth daily.    Marland Kitchen donepezil (ARICEPT) 10 MG tablet Take 1 tablet (10 mg total) by mouth at bedtime. 90 tablet 0  . FREESTYLE LITE test strip Use to test blood glucose once daily 100 each 3  . Lancets (FREESTYLE) lancets USE ONCE DAILY 100 each 3  . simvastatin (ZOCOR) 20 MG tablet TAKE ONE TABLET BY MOUTH EVERY NIGHT AT BEDTIME 90 tablet 2   No current facility-administered medications on file prior to visit.    ALLERGIES: No Known Allergies  FAMILY HISTORY: Family History  Problem Relation Age of Onset  .  Cancer Other        breast  . Diabetes Other     SOCIAL HISTORY: Social History   Socioeconomic History  . Marital status: Married    Spouse name: Not on file  . Number of children: 3  . Years of education: 42  . Highest education level: Master's degree (e.g., MA, MS, MEng, MEd, MSW, MBA)  Occupational History  . Occupation: Retired  Tobacco Use  . Smoking status: Never Smoker  . Smokeless tobacco: Never Used  Vaping Use  . Vaping Use: Never used  Substance and Sexual Activity  . Alcohol use: Not Currently    Alcohol/week: 0.0 standard drinks    Comment: Beer weekly/glass of wine daily  . Drug use: No  . Sexual activity: Not Currently    Partners: Female  Other Topics Concern  . Not on file  Social History Narrative    Lives with wife      Right handed      Master's Degree from [employer]   Social Determinants of Health   Financial Resource Strain: Low Risk   . Difficulty of Paying Living Expenses: Not hard at all  Food Insecurity: Not on file  Transportation Needs: No Transportation Needs  . Lack of Transportation (Medical): No  . Lack of Transportation (Non-Medical): No  Physical Activity: Not on file  Stress: Not on file  Social Connections: Not on file  Intimate Partner Violence: Not on file     PHYSICAL EXAM: Vitals:   04/28/20 0827  BP: 130/71  Pulse: 62  SpO2: 99%   General: No acute distress Head:  Normocephalic/atraumatic Skin/Extremities: No rash, no edema Neurological Exam: alert and oriented to person, place, and time. No aphasia or dysarthria. Fund of knowledge is appropriate.  Recent and remote memory are intact.  Attention and concentration are normal. MMSE 28/30. He had more difficulty with intersecting pentagons MMSE - Mini Mental State Exam 04/28/2020 03/29/2017 03/30/2015  Orientation to time 5 5 5   Orientation to Place 5 5 5   Registration 3 3 3   Attention/ Calculation 4 4 4   Recall 3 3 2   Language- name 2 objects 2 2 2   Language- repeat 1 1 1   Language- follow 3 step command 3 3 3   Language- read & follow direction 1 1 1   Write a sentence 1 1 1   Copy design 0 1 1  Total score 28 29 28    Cranial nerves: Pupils equal, round. Extraocular movements intact with no nystagmus. Visual fields full.  No facial asymmetry.  Motor: Bulk and tone normal, no cogwheeling, muscle strength 5/5 throughout with no pronator drift.   Finger to nose testing intact.  Gait hunched posture with short strides and reduced arm swing. Romberg negative. Fair finger taps but overall appears bradykinetic. +Postural instability. No tremor in the office today except when writing a sentence.   IMPRESSION: This is an 85 yo RH man with a vascular risk factors including diabetes, hyperlipidemia, who  presented last January 2017 for memory loss. He has had 3 Neuropsychological evaluations done over the years, most recently in 01/2019 showed mild to moderate frontal-subcortical dysfunction that is typically seen with vascular neurocognitive disorder, however repeat brain imaging in 2021 again incongruent. He has bradykinesia, hunched posture, postural instability, no significant tremors. I wonder about Parkinsonian pathology. Continue Donepezil 10mg  daily. We discussed starting Lexapro 10mg  daily for mood changes. Discussed physical therapy for balance and gait. Discussed the importance of control of vascular risk factors, physical exercise, and  brain stimulation exercises for brain health. Follow-up in 6 months, they know to call for any changes.   Thank you for allowing me to participate in his care.  Please do not hesitate to call for any questions or concerns.   Ellouise Newer, M.D.   CC: Dorothyann Peng, NP

## 2020-07-06 ENCOUNTER — Telehealth: Payer: Self-pay | Admitting: Neurology

## 2020-07-06 NOTE — Telephone Encounter (Signed)
Patient needs to speak to someone about what is going on with his Memory. He states he is getting worse and the After hour nurse states he needs to be seen right away

## 2020-07-06 NOTE — Telephone Encounter (Signed)
Patient called in and spoke with the access nurse to speak with someone about his memory. He has been having some issues with his memory. He mentioned a change in his "happy pills".

## 2020-07-06 NOTE — Telephone Encounter (Signed)
Pt called he stated that he has been on the escitalopram for about a month his memory he stated is getting worse but the pill makes him "feel good" asking if he should stop the escitalopram because of the memory issues;

## 2020-07-07 NOTE — Telephone Encounter (Signed)
Pt called and informed he is on a  low dose, he can stop it and update in a week if there is an improvement in the symptoms he has noticed. If none, it is not the medication. If better, we can try a different mood medication

## 2020-07-07 NOTE — Telephone Encounter (Signed)
It is a low dose, he can stop it and update in a week if there is an improvement in the symptoms he has noticed. If none, it is not the medication. If better, we can try a different mood medication. Thanks

## 2020-07-20 ENCOUNTER — Telehealth: Payer: Self-pay | Admitting: Neurology

## 2020-07-20 NOTE — Telephone Encounter (Signed)
Patient called and said he needs some guidance with switching his medications. He stopped taking the escitalopram 10 mg. He'd like a call back from a nurse from a nurse.   He also said, at this time, he wants to hold off on starting something new until he gets a good explanation of what happened.

## 2020-07-20 NOTE — Telephone Encounter (Signed)
Pt now states that he does not want to start any new medications, he feels like he is a Printmaker and now he is going to hang up on me, pt stated that is fine I hope he has a good day,

## 2020-07-20 NOTE — Telephone Encounter (Signed)
We can start a very low dose of Sertraline 25mg  1/2 tablet daily and see if this helps mood but does not cause side effects. Side effects can include dizziness, drowsiness. We will start on a very low dose and if no side effects, can increase in 2 weeks, call for an update. If he is agreeable, pls send Rx, thanks

## 2020-07-20 NOTE — Telephone Encounter (Signed)
Pt called he stated that he is feeling better off the escitalopram, he said he is more awake, and thinks he is ready to try something new,

## 2020-07-21 DIAGNOSIS — L578 Other skin changes due to chronic exposure to nonionizing radiation: Secondary | ICD-10-CM | POA: Diagnosis not present

## 2020-07-21 DIAGNOSIS — E119 Type 2 diabetes mellitus without complications: Secondary | ICD-10-CM | POA: Diagnosis not present

## 2020-07-29 ENCOUNTER — Telehealth: Payer: Self-pay | Admitting: Pharmacist

## 2020-07-29 NOTE — Chronic Care Management (AMB) (Signed)
    Chronic Care Management Pharmacy Assistant   Name: Erik Lara  MRN: [id number] DOB: 1935/05/10  Spoke with patient and reminded him of his appointment with Jeni Salles the clinical pharmacist on 07/30/20 at 11:30am by phone. Advised patient to have all medications as well as any blood pressure or blood glucose readings close by and available for review. Patient verbalized understanding. [name].   [place] [phone]

## 2020-07-30 ENCOUNTER — Ambulatory Visit (INDEPENDENT_AMBULATORY_CARE_PROVIDER_SITE_OTHER): Payer: PPO | Admitting: Pharmacist

## 2020-07-30 DIAGNOSIS — J309 Allergic rhinitis, unspecified: Secondary | ICD-10-CM

## 2020-07-30 DIAGNOSIS — E785 Hyperlipidemia, unspecified: Secondary | ICD-10-CM | POA: Diagnosis not present

## 2020-07-30 NOTE — Patient Instructions (Addendum)
Hi Ankush,  It was great to get to speak with you again! Below is a summary of some of the topics we discussed.   Please reach out to me if you have any questions or need anything before our follow up!  Best, Maddie  Jeni Salles, PharmD, [facility] [place] [phone]  Visit Information  Goals Addressed   None    Patient Care Plan: CCM Pharmacy Care Plan    Problem Identified: Problem: Hyperlipidemia, Diabetes, Allergic Rhinitis and Memory loss     Long-Range Goal: Patient-Specific Goal   Start Date: 07/30/2020  Expected End Date: 07/30/2021  This Visit's Progress: On track  Priority: High  Note:   Current Barriers:  . Unable to independently monitor therapeutic efficacy  Pharmacist Clinical Goal(s):  Marland Kitchen Patient will achieve adherence to monitoring guidelines and medication adherence to achieve therapeutic efficacy through collaboration with PharmD and provider.   Interventions: . 1:1 collaboration with Dorothyann Peng, NP regarding development and update of comprehensive plan of care as evidenced by provider attestation and co-signature . Inter-disciplinary care team collaboration (see longitudinal plan of care) . Comprehensive medication review performed; medication list updated in electronic medical record  Hyperlipidemia: (LDL goal < 100) -Controlled -Current treatment: . Simvastatin 20 mg 1 tablet at bedtime -Medications previously tried: none  -Current dietary patterns: did not discuss -Current exercise habits: did not discuss -Educated on Cholesterol goals;  Benefits of statin for ASCVD risk reduction; -Counseled on diet and exercise extensively Recommended to continue current medication  Diabetes (A1c goal <7%) -Controlled -Current medications: . No medications -Medications previously tried:  metformin (no longer needed)   -Current home glucose readings . fasting glucose: does not need to monitor at this  time . post prandial glucose: does not need to monitor at this time -Denies hypoglycemic/hyperglycemic symptoms -Current meal patterns:  . breakfast: did not discuss  . lunch: did not discuss   . dinner: did not discuss  . snacks: did not discuss  . drinks: did not discuss  -Current exercise: did not discuss  -Educated on A1c and blood sugar goals; Exercise goal of 150 minutes per week; Carbohydrate counting and/or plate method -Counseled to check feet daily and get yearly eye exams -Counseled on diet and exercise extensively   Memory loss (Goal: slow progression of memory loss) -Controlled -Current treatment   Donepezil 10 mg 1 tablet daily -Medications previously tried: escitalopram (worsened memory) -Counseled on no rush for starting escitalopram or other similar medications. Patient felt he was pressured into taking something else. Recommended for him to think about it and reassess in a few months.  Allergic rhinitis (Goal: minimize symptoms) -Controlled -Current treatment   Cetirizine 10 mg 1 tablet daily -Medications previously tried: none  -Recommended to continue current medication   Health Maintenance -Vaccine gaps: shingrix -Current therapy:  . No medications -Educated on Cost vs benefit of each product must be carefully weighed by individual consumer -Patient is satisfied with current therapy and denies issues -Recommended to continue as is  Patient Goals/Self-Care Activities . Patient will:  - take medications as prescribed  Follow Up Plan: Telephone follow up appointment with care management team member scheduled for: 6 months       Patient verbalizes understanding of instructions provided today and agrees to view in [place].  Telephone follow up appointment with pharmacy team member scheduled for:6 months  Viona Gilmore, [facility]

## 2020-07-30 NOTE — Progress Notes (Signed)
Chronic Care Management Pharmacy Note  08/11/2020 Name:  Erik Lara MRN:  [id number] DOB:  1935/12/07  Subjective: Erik Lara is an 85 y.o. year old male who is a primary patient of Dorothyann Peng, NP.  The CCM team was consulted for assistance with disease management and care coordination needs.    Engaged with patient by telephone for follow up visit in response to provider referral for pharmacy case management and/or care coordination services.   Consent to Services:  The patient was given information about Chronic Care Management services, agreed to services, and gave verbal consent prior to initiation of services.  Please see initial visit note for detailed documentation.   Patient Care Team: Dorothyann Peng, NP as PCP - General (Family Medicine) Cameron Sprang, MD as Consulting Physician (Neurology) Viona Gilmore, [facility] as Pharmacist (Pharmacist)  Recent office visits: 04/23/20 Dorothyann Peng, NP: Patient presented for annual exam. No medication changes.  Recent consult visits: 04/28/20 Ellouise Newer, MD (neurology): Patient presented for memory loss follow up. Started escitalopram 10 mg daily. Placed referral for PT.  03/04/20 Wylene Simmer, MD (ortho surgery): Unable to access notes.  07/24/19 Athur Pearline Cables (dermatology): Patient presented for follow up. Unable to access notes.  Hospital visits: None in previous 6 months  Objective:  Lab Results  Component Value Date   CREATININE 0.69 04/23/2020   BUN 12 04/23/2020   GFR 85.84 04/23/2020   GFRNONAA 117.85 02/23/2009   GFRAA 123 02/05/2008   NA 139 04/23/2020   K 4.3 04/23/2020   CALCIUM 9.7 04/23/2020   CO2 29 04/23/2020   GLUCOSE 111 (H) 04/23/2020    Lab Results  Component Value Date/Time   HGBA1C 6.1 04/23/2020 09:49 AM   HGBA1C 5.9 (A) 11/01/2019 01:40 PM   HGBA1C 5.9 08/01/2019 01:44 PM   HGBA1C 6.6 (H) 11/06/2018 09:40 AM   GFR 85.84 04/23/2020 09:49 AM   GFR 110.02 04/26/2018 08:58 AM    MICROALBUR 1.4 04/23/2020 09:49 AM   MICROALBUR 0.7 04/26/2018 08:58 AM    Last diabetic Eye exam:  Lab Results  Component Value Date/Time   HMDIABEYEEXA No Retinopathy 04/06/2017 12:00 AM    Last diabetic Foot exam: No results found for: HMDIABFOOTEX   Lab Results  Component Value Date   CHOL 115 04/23/2020   HDL 47.90 04/23/2020   LDLCALC 53 04/23/2020   TRIG 69.0 04/23/2020   CHOLHDL 2 04/23/2020    Hepatic Function Latest Ref Rng & Units 04/23/2020 04/26/2018 04/18/2017  Total Protein 6.0 - 8.3 g/dL 6.2 6.1 6.5  Albumin 3.5 - 5.2 g/dL 4.0 4.0 3.9  AST 0 - 37 U/L _0 ALT 0 - 53 U/L _1 Alk Phosphatase 39 - 117 U/L 71 71 75  Total Bilirubin 0.2 - 1.2 mg/dL 1.6(H) 1.1 1.3(H)  Bilirubin, Direct 0.0 - 0.3 mg/dL - - 0.3    Lab Results  Component Value Date/Time   TSH 0.25 (L) 04/23/2020 09:49 AM   TSH 0.39 04/26/2018 08:58 AM   FREET4 0.94 04/19/2017 09:46 AM   FREET4 0.87 04/05/2016 11:43 AM    CBC Latest Ref Rng & Units 04/23/2020 04/26/2018 04/18/2017  WBC 4.0 - 10.5 K/uL 5.3 6.0 9.4  Hemoglobin 13.0 - 17.0 g/dL 14.1 14.5 14.2  Hematocrit 39.0 - 52.0 % 41.8 43.0 42.1  Platelets 150.0 - 400.0 K/uL 209.0 248.0 287.0    No results found for: VD25OH  Clinical ASCVD: No  The ASCVD Risk score (Goff DC  Jr., et al., 2013) failed to calculate for the following reasons:   The 2013 ASCVD risk score is only valid for ages 71 to 74    Depression screen PHQ 2/9 04/23/2020 04/19/2018 04/18/2017  Decreased Interest 0 0 1  Down, Depressed, Hopeless 0 0 0  PHQ - 2 Score 0 0 1  Altered sleeping - - 0  Tired, decreased energy - - 1  Change in appetite - - 0  Feeling bad or failure about yourself  - - 0  Trouble concentrating - - 2  Moving slowly or fidgety/restless - - 1  Suicidal thoughts - - 0  PHQ-9 Score - - 5  Difficult doing work/chores - - Somewhat difficult      Social History   Tobacco Use  Smoking Status Never Smoker  Smokeless Tobacco Never Used   BP  Readings from Last 3 Encounters:  04/28/20 130/71  04/23/20 100/68  01/15/20 138/76   Pulse Readings from Last 3 Encounters:  04/28/20 62  01/15/20 88  07/23/19 85   Wt Readings from Last 3 Encounters:  04/28/20 191 lb 12.8 oz (87 kg)  04/23/20 192 lb (87.1 kg)  01/15/20 217 lb (98.4 kg)   BMI Readings from Last 3 Encounters:  04/28/20 25.30 kg/m  04/23/20 25.33 kg/m  01/15/20 28.63 kg/m    Assessment/Interventions: Review of patient past medical history, allergies, medications, health status, including review of consultants reports, laboratory and other test data, was performed as part of comprehensive evaluation and provision of chronic care management services.   SDOH:  (Social Determinants of Health) assessments and interventions performed: No  SDOH Screenings   Alcohol Screen: Not on file  Depression (PHQ2-9): Low Risk   . PHQ-2 Score: 0  Financial Resource Strain: Low Risk   . Difficulty of Paying Living Expenses: Not hard at all  Food Insecurity: Not on file  Housing: Not on file  Physical Activity: Not on file  Social Connections: Not on file  Stress: Not on file  Tobacco Use: Low Risk   . Smoking Tobacco Use: Never Smoker  . Smokeless Tobacco Use: Never Used  Transportation Needs: No Transportation Needs  . Lack of Transportation (Medical): No  . Lack of Transportation (Non-Medical): No   Patient was concerned about memory with his happy pill that was prescribed. He also wanted to discuss his medications to make sure he was taking the right medications.   Patient thought he had been paying down on a mortgage and was actually paying down on a credit and is very stressed about this.  CCM Care Plan  No Known Allergies  Medications Reviewed Today    Reviewed by Viona Gilmore, [facility] (Pharmacist) on 07/30/20 at 1214  Med List Status: <None>  Medication Order Taking? Sig Documenting Provider Last Dose Status Informant  cetirizine (ZYRTEC) 10 MG tablet  [id number] Yes Take 10 mg by mouth daily. [provider] Taking Active Self  donepezil (ARICEPT) 10 MG tablet [id number] Yes Take 1 tablet (10 mg total) by mouth at bedtime. Cameron Sprang, MD Taking Active   FREESTYLE LITE test strip [id number]  Use to test blood glucose once daily Nafziger, Tommi Rumps, NP  Active   Lancets (FREESTYLE) lancets [id number]  USE ONCE DAILY Nafziger, Tommi Rumps, NP  Active   simvastatin (ZOCOR) 20 MG tablet [id number] Yes TAKE ONE TABLET BY MOUTH EVERY NIGHT AT BEDTIME Dorothyann Peng, NP Taking Active           Patient Active Problem List  Diagnosis Date Noted  . Hearing loss of right ear 01/23/2017  . Mild neurocognitive disorder 09/13/2016  . Type 2 diabetes mellitus without complication, without long-term current use of insulin ([place]) 03/30/2015  . Hyperthyroidism 01/28/2013  . Erectile Dysfunction 02/19/2008  . Allergic rhinitis 02/19/2008  . Hyperlipidemia 02/15/2007  . Hepatic Cyst 02/15/2007    Immunization History  Administered Date(s) Administered  . Fluad Quad(high Dose 65+) 01/30/2020  . Influenza Split 01/17/2013  . Influenza Whole 12/13/2006, 12/12/2008, 12/12/2009  . Influenza, High Dose Seasonal PF 01/11/2014  . Influenza-Unspecified 12/25/2014, 12/31/2016  . PFIZER(Purple Top)SARS-COV-2 Vaccination 04/27/2019, 05/20/2019, 12/28/2019  . Pneumococcal Conjugate-13 01/27/2014  . Pneumococcal Polysaccharide-23 03/14/2005  . Td 03/15/2003  . Tetanus 01/27/2014    Conditions to be addressed/monitored:  Hyperlipidemia, Diabetes, Allergic Rhinitis and Memory loss  Care Plan : [place]  Updates made by Viona Gilmore, [place] since 08/11/2020 12:00 AM    Problem: Problem: Hyperlipidemia, Diabetes, Allergic Rhinitis and Memory loss     Long-Range Goal: Patient-Specific Goal   Start Date: 07/30/2020  Expected End Date: 07/30/2021  This Visit's Progress: On track  Priority: High  Note:   Current Barriers:  . Unable to  independently monitor therapeutic efficacy  Pharmacist Clinical Goal(s):  Marland Kitchen Patient will achieve adherence to monitoring guidelines and medication adherence to achieve therapeutic efficacy through collaboration with PharmD and provider.   Interventions: . 1:1 collaboration with Dorothyann Peng, NP regarding development and update of comprehensive plan of care as evidenced by provider attestation and co-signature . Inter-disciplinary care team collaboration (see longitudinal plan of care) . Comprehensive medication review performed; medication list updated in electronic medical record  Hyperlipidemia: (LDL goal < 100) -Controlled -Current treatment: . Simvastatin 20 mg 1 tablet at bedtime -Medications previously tried: none  -Current dietary patterns: did not discuss -Current exercise habits: did not discuss -Educated on Cholesterol goals;  Benefits of statin for ASCVD risk reduction; -Counseled on diet and exercise extensively Recommended to continue current medication  Diabetes (A1c goal <7%) -Controlled -Current medications: . No medications -Medications previously tried:  metformin (no longer needed)   -Current home glucose readings . fasting glucose: does not need to monitor at this time . post prandial glucose: does not need to monitor at this time -Denies hypoglycemic/hyperglycemic symptoms -Current meal patterns:  . breakfast: did not discuss  . lunch: did not discuss   . dinner: did not discuss  . snacks: did not discuss  . drinks: did not discuss  -Current exercise: did not discuss  -Educated on A1c and blood sugar goals; Exercise goal of 150 minutes per week; Carbohydrate counting and/or plate method -Counseled to check feet daily and get yearly eye exams -Counseled on diet and exercise extensively   Memory loss (Goal: slow progression of memory loss) -Controlled -Current treatment   Donepezil 10 mg 1 tablet daily -Medications previously tried: escitalopram  (worsened memory) -Counseled on no rush for starting escitalopram or other similar medications. Patient felt he was pressured into taking something else. Recommended for him to think about it and reassess in a few months.  Allergic rhinitis (Goal: minimize symptoms) -Controlled -Current treatment   Cetirizine 10 mg 1 tablet daily -Medications previously tried: none  -Recommended to continue current medication   Health Maintenance -Vaccine gaps: shingrix -Current therapy:  . No medications -Educated on Cost vs benefit of each product must be carefully weighed by individual consumer -Patient is satisfied with current therapy and denies issues -Recommended to continue as is  Patient Goals/Self-Care Activities . Patient will:  - take medications as prescribed  Follow Up Plan: Telephone follow up appointment with care management team member scheduled for: 6 months      Medication Assistance: None required.  Patient affirms current coverage meets needs.  Patient's preferred pharmacy is:  [facility] [address] [place] - [place] [address] Phone: [phone] Fax: [phone]  [facility] [phone] [place], [place] [address] Phone: [phone] Fax: [phone]  Uses pill box? No - few medications Pt endorses unknown% compliance  We discussed: Current pharmacy is preferred with insurance plan and patient is satisfied with pharmacy services Patient decided to: Continue current medication management strategy  Care Plan and Follow Up Patient Decision:  Patient agrees to Care Plan and Follow-up.  Plan: Telephone follow up appointment with care management team member scheduled for:  6 months  Jeni Salles, PharmD [place] Pharmacist [facility] [place] [phone]

## 2020-08-14 ENCOUNTER — Telehealth: Payer: Self-pay | Admitting: Neurology

## 2020-08-14 NOTE — Telephone Encounter (Signed)
No answer at 323 08/14/2020

## 2020-08-14 NOTE — Telephone Encounter (Signed)
Patient called and said, "I am so sorry for my last phone conversation. I was not anywhere close to being myself."  See telephone note from 07/20/20  He said a call back will make him feel better about it.

## 2020-08-17 NOTE — Telephone Encounter (Signed)
No answer 08/17/2020

## 2020-08-18 NOTE — Telephone Encounter (Signed)
Pt is very confused asking if he can be seen sooner, and he was sorry for how he talked on earlier phone call, pt advised that we can put him on a wait list to be seen sooner, he stated that would be a very good idea. I will ask the front desk to please add pt to wait list

## 2020-08-18 NOTE — Telephone Encounter (Signed)
Ok to schedule with Clarise Cruz, thanks

## 2020-08-18 NOTE — Telephone Encounter (Signed)
Message sent to front [facility] to schedule with Clarise Cruz

## 2020-09-01 DIAGNOSIS — L578 Other skin changes due to chronic exposure to nonionizing radiation: Secondary | ICD-10-CM | POA: Diagnosis not present

## 2020-10-04 ENCOUNTER — Other Ambulatory Visit: Payer: Self-pay | Admitting: Adult Health

## 2020-10-13 ENCOUNTER — Encounter: Payer: Self-pay | Admitting: Neurology

## 2020-10-13 ENCOUNTER — Ambulatory Visit: Payer: PPO | Admitting: Neurology

## 2020-10-13 ENCOUNTER — Other Ambulatory Visit: Payer: Self-pay

## 2020-10-13 VITALS — BP 111/71 | HR 98 | Ht 73.0 in | Wt 185.0 lb

## 2020-10-13 DIAGNOSIS — G20 Parkinson's disease: Secondary | ICD-10-CM

## 2020-10-13 DIAGNOSIS — G3184 Mild cognitive impairment, so stated: Secondary | ICD-10-CM | POA: Diagnosis not present

## 2020-10-13 DIAGNOSIS — R2681 Unsteadiness on feet: Secondary | ICD-10-CM | POA: Diagnosis not present

## 2020-10-13 MED ORDER — MEMANTINE HCL 10 MG PO TABS: ORAL_TABLET | ORAL | 11 refills | Status: DC

## 2020-10-13 MED ORDER — DONEPEZIL HCL 10 MG PO TABS: 10.0000 mg | ORAL_TABLET | Freq: Every day | ORAL | 3 refills | Status: AC

## 2020-10-13 NOTE — Patient Instructions (Signed)
Start Memantine '10mg'$ : take 1 tablet every night for 2 weeks, then increase to 1 tablet twice a day  2. Continue Donepezil '10mg'$  daily  3. Schedule DaTscan  4. Refer to Physical Therapy for gait and balance  5. Follow-up in 6 months, call for any changes   FALL PRECAUTIONS: Be cautious when walking. Scan the area for obstacles that may increase the risk of trips and falls. When getting up in the mornings, sit up at the edge of the bed for a few minutes before getting out of bed. Consider elevating the bed at the head end to avoid drop of blood pressure when getting up. Walk always in a well-lit room (use night lights in the walls). Avoid area rugs or power cords from appliances in the middle of the walkways. Use a walker or a cane if necessary and consider physical therapy for balance exercise. Get your eyesight checked regularly.  FINANCIAL OVERSIGHT: Supervision, especially oversight when making financial decisions or transactions is also recommended.  HOME SAFETY: Consider the safety of the kitchen when operating appliances like stoves, microwave oven, and blender. Consider having supervision and share cooking responsibilities until no longer able to participate in those. Accidents with firearms and other hazards in the house should be identified and addressed as well.  DRIVING: Regarding driving, in patients with progressive memory problems, driving will be impaired. We advise to have someone else do the driving if trouble finding directions or if minor accidents are reported. Independent driving assessment is available to determine safety of driving.  ABILITY TO BE LEFT ALONE: If patient is unable to contact 911 operator, consider using LifeLine, or when the need is there, arrange for someone to stay with patients. Smoking is a fire hazard, consider supervision or cessation. Risk of wandering should be assessed by caregiver and if detected at any point, supervision and safe proof  recommendations should be instituted.  MEDICATION SUPERVISION: Inability to self-administer medication needs to be constantly addressed. Implement a mechanism to ensure safe administration of the medications.  RECOMMENDATIONS FOR ALL PATIENTS WITH MEMORY PROBLEMS: 1. Continue to exercise (Recommend 30 minutes of walking everyday, or 3 hours every week) 2. Increase social interactions - continue going to [place] and enjoy social gatherings with friends and family 3. Eat healthy, avoid fried foods and eat more fruits and vegetables 4. Maintain adequate blood pressure, blood sugar, and blood cholesterol level. Reducing the risk of stroke and cardiovascular disease also helps promoting better memory. 5. Avoid stressful situations. Live a simple life and avoid aggravations. Organize your time and prepare for the next day in anticipation. 6. Sleep well, avoid any interruptions of sleep and avoid any distractions in the bedroom that may interfere with adequate sleep quality 7. Avoid sugar, avoid sweets as there is a strong link between excessive sugar intake, diabetes, and cognitive impairment The Mediterranean diet has been shown to help patients reduce the risk of progressive memory disorders and reduces cardiovascular risk. This includes eating fish, eat fruits and green leafy vegetables, nuts like almonds and hazelnuts, walnuts, and also use olive oil. Avoid fast foods and fried foods as much as possible. Avoid sweets and sugar as sugar use has been linked to worsening of memory function.  There is always a concern of gradual progression of memory problems. If this is the case, then we may need to adjust level of care according to patient needs. Support, both to the patient and caregiver, should then be put into place.

## 2020-10-13 NOTE — Progress Notes (Signed)
NEUROLOGY FOLLOW UP OFFICE NOTE  Erik Lara [id number] [birth year]  HISTORY OF PRESENT ILLNESS: I had the pleasure of seeing Erik Lara in follow-up in the neurology clinic on 10/13/2020.  The patient was last seen 6 months ago for Mild Neurocognitive Disorder, etiology unclear. He is again accompanied by his wife who helps supplement the history today.  Records and images were personally reviewed where available.  He has had 3 Neuropsychological evaluations in 2018, 2019, and most recently in 01/2019 which showed mild to moderate frontal-subcortical dysfunction with primary deficits in processing speed, encoding and retrieval aspects of verbal memory, working memory, verbal fluency, and some aspects of cognitive flexibility. Compared to prior testing, there was notable decline across processing speed, and more mild declines across other domins. He continued to meet criteria for Mild neurocognitive disorder, etiology unclear, profile is consistent with what is seen in a vascular neurocognitive disorder, however brain imaging is not congruent. Alzheimer's appears unlikely. MRI brain without contrast done 09/2019 did not show any acute changes. There was mild to moderate diffuse atrophy, no significant white matter disease. He is on Donepezil '10mg'$  daily without side effects. He was started on Lexapro on last visit due to mood changes, he felt memory was worsening even if it made his mood better. He did not want to start a different medication.  He states his memory is worsening. He continues to drive on occasion and sometimes would not know exactly where he is, however his wife does most of the driving. He states his wife's memory is worsening as well. He frequently forgets what he went to get in a room. He states he has gotten better recently with medications, for a while he was not sure what he was taking. He states he has not been getting confused recently. His wife writes the checks, his  handwriting has deteriorated. He denies any difficulty using utensils. He has a harder time with buttons but is independent with dressing and bathing. He denies any tremors but is noted to be tremulous with slight upper body jerks today. Speech has changed compared to prior visit, there is mild dysarthria. He feels his voice is hoarser, his wife states he does not speak clearly. No difficulty swallowing. Sleep is fine, his wife notes some REM behaviors, waving around in his sleep the other night. He endorses visual hallucinations, there was a group of ladies sitting on their patio last night, no auditory component. His wife reports he usually sees cats.    History on Initial Assessment 03/30/2015: This is a pleasant 85 yo RH man with a history of hyperlipidemia, diabetes, who presented for memory loss. He started noticing changes around 1-1/2 years ago, he would notice his memory would be "spotty," with a blank spot occurring once in a while. He reported this to his PCP, then again on his next follow-up visit last November 2016. He was concerned about an episode [place] 2 [place] a familiar place, when he could not recall which exit to take. He missed the exit and had to turn around. He has noticed moments while driving where he briefly has a blank spot ("new garden or spring garden") then quickly passes. He has been told by his wife that he repeats himself. He has always had word-finding difficulties, but noticed this has worsened. He infrequently misses bill payments, stating he has a record system, but would miss paying if he gets very busy. He denies any missed medications. He  denies any family history of dementia, history of head injury or alcohol use.   He has had poor sense of smell for many years. He has noticed some difficulty reading from afar, better if he looks closer. He has occasional cramping in the calf muscles, left>right, worse at night. He denies any falls. He  goes to exercise class 3-4 times a week.   Diagnostic Data: MRI brain without contrast done 04/14/15 did not show any acute changes. There was moderate cerebral atrophy and minimal chronic microvascular disease.   Neuropsychological evaluation in 2018 was largely within normal limits. There were a few areas of impairment of mild fronto-subcortical dysfunction. Testing did not warrant a diagnosis of dementia, but mild cognitive impairment was appropriate at this time. There is a longstanding history of attention/executive functioning difficulties (such as ADHD) based on self-report.   Neuropsychological testing in June 2019: Impression of Non-amnestic MCI (stable from 2018). There was no evidence of an underlying memory disorder or Alzheimer's disease. He reported improved anxiety.   Neuropsychological testing in 01/2019: "He continues to meet criteria for a Mild Neurocognitive Disorder (formerly "mild cognitive impairment") at the present time. The etiology of these deficits remains unclear. I share Dr. Marcia Brash surprise that Erik Lara does not display more advanced small vessel ischemic disease given that his cognitive profile is consistent with what would be expected in a vascular neurocognitive disorder. Given intact visual learning and memory, coupled with the relative stability of much of Erik Lara's cognitive profile, Alzheimer's disease appears unlikely at the present time."  MRI brain without contrast done 09/2019 did not show any acute changes. There was mild to moderate diffuse atrophy, no significant white matter disease.  PAST MEDICAL HISTORY: Past Medical History:  Diagnosis Date   Gallstones    asymptomatic   Hearing loss of right ear 01/23/2017   Hepatic Cysts    Hyperlipidemia    Hyperthyroidism 01/28/2013   Mild neurocognitive disorder 09/13/2016   Prostatitis    requiring a trip to the ER in [place] with a fever - 09   Type 2 diabetes mellitus without complication,  without long-term current use of insulin ([place]) 03/30/2015    MEDICATIONS: Current Outpatient Medications on File Prior to Visit  Medication Sig Dispense Refill   cetirizine (ZYRTEC) 10 MG tablet Take 10 mg by mouth daily.     donepezil (ARICEPT) 10 MG tablet Take 1 tablet (10 mg total) by mouth at bedtime. 90 tablet 3   FREESTYLE LITE test strip Use to test blood glucose once daily 100 each 3   Lancets (FREESTYLE) lancets USE ONCE DAILY 100 each 3   simvastatin (ZOCOR) 20 MG tablet TAKE ONE TABLET BY MOUTH EVERY NIGHT AT BEDTIME 90 tablet 2   No current facility-administered medications on file prior to visit.    ALLERGIES: No Known Allergies  FAMILY HISTORY: Family History  Problem Relation Age of Onset   Cancer Other        breast   Diabetes Other     SOCIAL HISTORY: Social History   Socioeconomic History   Marital status: Married    Spouse name: Not on file   Number of children: 3   Years of education: 18   Highest education level: Master's degree (e.g., MA, MS, MEng, MEd, MSW, MBA)  Occupational History   Occupation: Retired  Tobacco Use   Smoking status: Never   Smokeless tobacco: Never  Vaping Use   Vaping Use: Never used  Substance and Sexual Activity  Alcohol use: Not Currently    Alcohol/week: 0.0 standard drinks    Comment: Beer weekly/glass of wine daily   Drug use: No   Sexual activity: Not Currently    Partners: Female  Other Topics Concern   Not on file  Social History Narrative   Lives with wife      Right handed      Master's Degree from [employer]   Social Determinants of Health   Financial Resource Strain: Low Risk    Difficulty of Paying Living Expenses: Not hard at all  Food Insecurity: Not on file  Transportation Needs: No Transportation Needs   Lack of Transportation (Medical): No   Lack of Transportation (Non-Medical): No  Physical Activity: Not on file  Stress: Not on file  Social Connections: Not on file  Intimate Partner  Violence: Not on file     PHYSICAL EXAM: Vitals:   10/13/20 1408  BP: 111/71  Lara: 98  SpO2: 98%   General: No acute distress, disheveled appearance. There are brief jerks in his upper body/arms today Head:  Normocephalic/atraumatic Skin/Extremities: No rash, no edema Neurological Exam: alert and oriented to person, place, and time. No aphasia. Speech is slightly more dysarthric today. Fund of knowledge is appropriate.  Recent and remote memory are intact.  Attention and concentration are normal.  MMSE 27/30 MMSE - Mini Mental State Exam 10/13/2020 04/28/2020 03/29/2017  Orientation to time '4 5 5  '$ Orientation to Place '5 5 5  '$ Registration '3 3 3  '$ Attention/ Calculation '4 4 4  '$ Recall '3 3 3  '$ Language- name 2 objects '2 2 2  '$ Language- repeat '1 1 1  '$ Language- follow 3 step command '3 3 3  '$ Language- read & follow direction '1 1 1  '$ Write a sentence '1 1 1  '$ Copy design 0 0 1  Total score '27 28 29   '$ Cranial nerves: Pupils equal, round. Extraocular movements intact with no nystagmus. Visual fields full.  No facial asymmetry.  Motor: Bulk and tone normal, no cogwheeling, muscle strength 5/5 throughout with no pronator drift.   Finger to nose testing intact.  Gait: hunched posture, he takes small steps with decreased arm swing, taking several steps on turning. +bradykinesia with finger and foot taps. No resting, postural or endpoint tremor but he is overall tremulous with movements   IMPRESSION: This is an 85 yo RH man with a vascular risk factors including diabetes, hyperlipidemia, with progressive memory loss. He has had 3 Neuropsychological evaluations done over the years, most recently in 01/2019 showed mild to moderate frontal-subcortical dysfunction that is typically seen with vascular neurocognitive disorder, however repeat brain imaging in 2021 again incongruent. MMSE today 27/30, he does well with memory testing however today he has more tremulousness, dysarthria, bradykinesia with hunched  posture and small steps, as well as visual hallucinations and possible RBD. DaTscan will be ordered to assess for parkinsonian pathology. He will be referred for physical therapy. Continue Donepezil '10mg'$  daily. Start Memantine '10mg'$  qhs for 2 weeks, then increase to '10mg'$  BID, side effects discussed. He was advised to have his wife do driving moving forward. Follow-up in 6 months, call for any changes.    Thank you for allowing me to participate in his care.  Please do not hesitate to call for any questions or concerns.    Ellouise Newer, M.D.   CC: Dorothyann Peng, NP

## 2020-10-15 ENCOUNTER — Encounter: Payer: Self-pay | Admitting: Neurology

## 2020-11-24 ENCOUNTER — Other Ambulatory Visit: Payer: Self-pay

## 2020-11-24 ENCOUNTER — Ambulatory Visit (HOSPITAL_COMMUNITY)
Admission: RE | Admit: 2020-11-24 | Discharge: 2020-11-24 | Disposition: A | Discharge status: Home / Self Care | Payer: PPO | Source: Ambulatory Visit | Attending: Neurology | Admitting: Neurology

## 2020-11-24 ENCOUNTER — Encounter (HOSPITAL_COMMUNITY)
Admission: RE | Admit: 2020-11-24 | Discharge: 2020-11-24 | Disposition: A | Discharge status: Home / Self Care | Payer: PPO | Source: Ambulatory Visit | Attending: Neurology | Admitting: Neurology

## 2020-11-24 DIAGNOSIS — G3184 Mild cognitive impairment, so stated: Secondary | ICD-10-CM | POA: Diagnosis not present

## 2020-11-24 DIAGNOSIS — R2681 Unsteadiness on feet: Secondary | ICD-10-CM | POA: Insufficient documentation

## 2020-11-24 DIAGNOSIS — G20 Parkinson's disease: Secondary | ICD-10-CM | POA: Diagnosis not present

## 2020-11-24 DIAGNOSIS — R269 Unspecified abnormalities of gait and mobility: Secondary | ICD-10-CM | POA: Diagnosis not present

## 2020-11-24 MED ORDER — IOFLUPANE I 123 185 MBQ/2.5ML IV SOLN
4.9100 | Freq: Once | INTRAVENOUS | Status: AC | PRN
  Administered 2020-11-24: 4.91 via INTRAVENOUS
  Filled 2020-11-24: qty 5

## 2020-11-24 MED ORDER — POTASSIUM IODIDE (ANTIDOTE) 130 MG PO TABS
ORAL_TABLET | ORAL | Status: AC
  Filled 2020-11-24: qty 1

## 2020-11-24 MED ORDER — POTASSIUM IODIDE (ANTIDOTE) 130 MG PO TABS
130.0000 mg | ORAL_TABLET | Freq: Once | ORAL | Status: AC
  Administered 2020-11-24: 130 mg via ORAL

## 2020-12-01 ENCOUNTER — Telehealth: Payer: Self-pay | Admitting: Pharmacist

## 2020-12-02 ENCOUNTER — Telehealth: Payer: Self-pay | Admitting: Neurology

## 2020-12-02 NOTE — Telephone Encounter (Signed)
Pt called, would like a call back to discuss his results of the procedure he had

## 2020-12-02 NOTE — Telephone Encounter (Signed)
Can he come to the office on Sept 30 at 11:30am to discuss results? Thanks

## 2020-12-03 NOTE — Telephone Encounter (Signed)
Patient called to request phone number for neurologist's office. Provided telephone number. Patient was looking for results of his recent testing. Called the neurologist's office and they told me to tell the patient to call during their business hours (8-4:30). Patient is aware and will call back.

## 2020-12-11 ENCOUNTER — Ambulatory Visit: Payer: PPO | Admitting: Neurology

## 2020-12-11 ENCOUNTER — Other Ambulatory Visit: Payer: Self-pay

## 2020-12-11 VITALS — BP 121/77 | HR 77 | Ht 73.0 in | Wt 185.6 lb

## 2020-12-11 DIAGNOSIS — G3183 Dementia with Lewy bodies: Secondary | ICD-10-CM | POA: Diagnosis not present

## 2020-12-11 DIAGNOSIS — F0280 Dementia in other diseases classified elsewhere without behavioral disturbance: Secondary | ICD-10-CM

## 2020-12-11 MED ORDER — MEMANTINE HCL 10 MG PO TABS: ORAL_TABLET | ORAL | 3 refills | Status: AC

## 2020-12-11 NOTE — Patient Instructions (Addendum)
Continue Donepezil 10mg  every night  2. Continue Memantine 10mg : Take 1 tablet twice a day  3. Follow-up as scheduled in March 2023, call for any changes   FALL PRECAUTIONS: Be cautious when walking. Scan the area for obstacles that may increase the risk of trips and falls. When getting up in the mornings, sit up at the edge of the bed for a few minutes before getting out of bed. Consider elevating the bed at the head end to avoid drop of blood pressure when getting up. Walk always in a well-lit room (use night lights in the walls). Avoid area rugs or power cords from appliances in the middle of the walkways. Use a walker or a cane if necessary and consider physical therapy for balance exercise. Get your eyesight checked regularly.  FINANCIAL OVERSIGHT: Supervision, especially oversight when making financial decisions or transactions is also recommended.  HOME SAFETY: Consider the safety of the kitchen when operating appliances like stoves, microwave oven, and blender. Consider having supervision and share cooking responsibilities until no longer able to participate in those. Accidents with firearms and other hazards in the house should be identified and addressed as well.  DRIVING: Regarding driving, in patients with progressive memory problems, driving will be impaired. We advise to have someone else do the driving if trouble finding directions or if minor accidents are reported. Independent driving assessment is available to determine safety of driving.  ABILITY TO BE LEFT ALONE: If patient is unable to contact 911 operator, consider using LifeLine, or when the need is there, arrange for someone to stay with patients. Smoking is a fire hazard, consider supervision or cessation. Risk of wandering should be assessed by caregiver and if detected at any point, supervision and safe proof recommendations should be instituted.  MEDICATION SUPERVISION: Inability to self-administer medication needs to be  constantly addressed. Implement a mechanism to ensure safe administration of the medications.  RECOMMENDATIONS FOR ALL PATIENTS WITH MEMORY PROBLEMS: 1. Continue to exercise (Recommend 30 minutes of walking everyday, or 3 hours every week) 2. Increase social interactions - continue going to [place] and enjoy social gatherings with friends and family 3. Eat healthy, avoid fried foods and eat more fruits and vegetables 4. Maintain adequate blood pressure, blood sugar, and blood cholesterol level. Reducing the risk of stroke and cardiovascular disease also helps promoting better memory. 5. Avoid stressful situations. Live a simple life and avoid aggravations. Organize your time and prepare for the next day in anticipation. 6. Sleep well, avoid any interruptions of sleep and avoid any distractions in the bedroom that may interfere with adequate sleep quality 7. Avoid sugar, avoid sweets as there is a strong link between excessive sugar intake, diabetes, and cognitive impairment The Mediterranean diet has been shown to help patients reduce the risk of progressive memory disorders and reduces cardiovascular risk. This includes eating fish, eat fruits and green leafy vegetables, nuts like almonds and hazelnuts, walnuts, and also use olive oil. Avoid fast foods and fried foods as much as possible. Avoid sweets and sugar as sugar use has been linked to worsening of memory function.  There is always a concern of gradual progression of memory problems. If this is the case, then we may need to adjust level of care according to patient needs. Support, both to the patient and caregiver, should then be put into place.

## 2020-12-11 NOTE — Progress Notes (Signed)
NEUROLOGY FOLLOW UP OFFICE NOTE  Erik Lara [id number] Aug 01, 1935  HISTORY OF PRESENT ILLNESS: I had the pleasure of seeing Erik Lara in follow-up in the neurology clinic on 12/11/2020.  The patient was last seen 6 weeks ago for Mild Neurocognitive Disorder. He is again accompanied by his wife who helps supplement the history today. Records and images were personally reviewed where available.  On his last visit, he was noted to have more tremulousness, dysarthria, bradykinesia with hunched posture and small steps, as well as visual hallucinations and possible RBD. DaTscan done 11/24/20 showed near absent activity within the posterior striata (putamen) on the left and right, Decreased radiotracer activity within the heads of the caudate nuclei on the left and right. DaTscan findings in addition to symptoms and signs suggestive of Lewy body dementia. He presents today to discuss results.  He was started on Memantine 10mg  BID on last visit which he feels makes him calm and collected. He denies any side effects. He is also on Donepezil 10mg  daily. He forgets if he took his morning dose. He has difficulty finishing his taxes, there are papers all over the floor, "I will be in trouble with the IRS." They deny any hallucinations since last visit, but he notes vivid dreams, last night he dreamed he took his ring off and could not find it, but he already put it in his chest. He has noticed some weight loss, no difficulty swallowing. He had 1 partial fall since last visit where he caught himself. No tremor today but states he cannot write due to shakiness.    History on Initial Assessment 03/30/2015: This is a pleasant 85 yo RH man with a history of hyperlipidemia, diabetes, who presented for memory loss. He started noticing changes around 1-1/2 years ago, he would notice his memory would be "spotty," with a blank spot occurring once in a while. He reported this to his PCP, then again on his next  follow-up visit last November 2016. He was concerned about an [place] familiar place, when he could not recall which exit to take. He missed the exit and had to turn around. He has noticed moments while driving where he briefly has a blank spot ("new garden or spring garden") then quickly passes. He has been told by his wife that he repeats himself. He has always had word-finding difficulties, but noticed this has worsened. He infrequently misses bill payments, stating he has a record system, but would miss paying if he gets very busy. He denies any missed medications. He denies any family history of dementia, history of head injury or alcohol use.   He has had poor sense of smell for many years. He has noticed some difficulty reading from afar, better if he looks closer. He has occasional cramping in the calf muscles, left>right, worse at night. He denies any falls. He goes to exercise class 3-4 times a week.   Diagnostic Data: MRI brain without contrast done 04/14/15 did not show any acute changes. There was moderate cerebral atrophy and minimal chronic microvascular disease.   Neuropsychological evaluation in 2018 was largely within normal limits. There were a few areas of impairment of mild fronto-subcortical dysfunction. Testing did not warrant a diagnosis of dementia, but mild cognitive impairment was appropriate at this time. There is a longstanding history of attention/executive functioning difficulties (such as ADHD) based on self-report.   Neuropsychological testing in June 2019: Impression of Non-amnestic MCI (  stable from 2018). There was no evidence of an underlying memory disorder or Alzheimer's disease. He reported improved anxiety.   Neuropsychological testing in 01/2019: "He continues to meet criteria for a Mild Neurocognitive Disorder (formerly "mild cognitive impairment") at the present time. The etiology of these deficits remains unclear.  I share Dr. Marcia Lara surprise that Erik Lara does not display more advanced small vessel ischemic disease given that his cognitive profile is consistent with what would be expected in a vascular neurocognitive disorder. Given intact visual learning and memory, coupled with the relative stability of much of Erik Lara's cognitive profile, Alzheimer's disease appears unlikely at the present time."  MRI brain without contrast done 09/2019 did not show any acute changes. There was mild to moderate diffuse atrophy, no significant white matter disease.  PAST MEDICAL HISTORY: Past Medical History:  Diagnosis Date   Gallstones    asymptomatic   Hearing loss of right ear 01/23/2017   Hepatic Cysts    Hyperlipidemia    Hyperthyroidism 01/28/2013   Mild neurocognitive disorder 09/13/2016   Prostatitis    requiring a trip to the ER in [place] with a fever - 09   Type 2 diabetes mellitus without complication, without long-term current use of insulin ([place]) 03/30/2015    MEDICATIONS: Current Outpatient Medications on File Prior to Visit  Medication Sig Dispense Refill   cetirizine (ZYRTEC) 10 MG tablet Take 10 mg by mouth daily.     donepezil (ARICEPT) 10 MG tablet Take 1 tablet (10 mg total) by mouth at bedtime. 90 tablet 3   FREESTYLE LITE test strip Use to test blood glucose once daily 100 each 3   Lancets (FREESTYLE) lancets USE ONCE DAILY 100 each 3   memantine (NAMENDA) 10 MG tablet Take 1 tablet every night for 2 weeks, then increase to 1 tablet twice a day 60 tablet 11   simvastatin (ZOCOR) 20 MG tablet TAKE ONE TABLET BY MOUTH EVERY NIGHT AT BEDTIME 90 tablet 2   No current facility-administered medications on file prior to visit.    ALLERGIES: No Known Allergies  FAMILY HISTORY: Family History  Problem Relation Age of Onset   Cancer Other        breast   Diabetes Other     SOCIAL HISTORY: Social History   Socioeconomic History   Marital status: Married    Spouse name:  Not on file   Number of children: 3   Years of education: 18   Highest education level: Master's degree (e.g., MA, MS, MEng, MEd, MSW, MBA)  Occupational History   Occupation: Retired  Tobacco Use   Smoking status: Never   Smokeless tobacco: Never  Vaping Use   Vaping Use: Never used  Substance and Sexual Activity   Alcohol use: Not Currently    Alcohol/week: 0.0 standard drinks    Comment: Beer weekly/glass of wine daily   Drug use: No   Sexual activity: Not Currently    Partners: Female  Other Topics Concern   Not on file  Social History Narrative   Lives with wife      Right handed      Master's Degree from [employer]   Social Determinants of Health   Financial Resource Strain: Low Risk    Difficulty of Paying Living Expenses: Not hard at all  Food Insecurity: Not on file  Transportation Needs: No Transportation Needs   Lack of Transportation (Medical): No   Lack of Transportation (Non-Medical): No  Physical Activity: Not on file  Stress: Not on file  Social Connections: Not on file  Intimate Partner Violence: Not on file     PHYSICAL EXAM: Vitals:   12/11/20 1132  BP: 121/77  Pulse: 77  SpO2: 99%   General: No acute distress, disheveled appearance Head:  Normocephalic/atraumatic, hypomimia Skin/Extremities: No rash, no edema Neurological Exam: alert and awake. No aphasia or dysarthria. Fund of knowledge is reduced. Attention and concentration are impaired, he is verbose and needs redirection. Cranial nerves: Pupils equal, round. Extraocular movements intact with no nystagmus. Visual fields full.  No facial asymmetry.  Motor: Bulk and tone normal, no cogwheeling. Muscle strength 5/5 throughout with no pronator drift.   Finger to nose testing intact.  Gait hunched posture with small steps, reduced arm swing, takes several steps when turning. +bradykinesia. No tremor today.    IMPRESSION: This is an 85 yo RH man with a vascular risk factors including diabetes,  hyperlipidemia, with progressive memory loss. He has had 3 Neuropsychological evaluations done over the years, most recently in 01/2019 showed mild to moderate frontal-subcortical dysfunction. On his last visit, he had more tremulousness, dysarthria, bradykinesia with hunched posture and small steps, as well as visual hallucinations and possible RBD. DaTscan showed showed near absent activity within the posterior striata (putamen) on the left and right, decreased radiotracer activity within the heads of the caudate nuclei on the left and right. Discussed signs and symptoms indicate Lewy body dementia. Diagnosis discussed with patient and wife, continue Donepezil 10mg  daily and Memantine 10mg  BID. He was advised to start using a pillbox or pillpack to help remember medications. He was advised to appoint a POA and start accepting help from family with financial affairs. Continue to monitor driving. Follow-up as scheduled in March 2023, they know to call for any changes.   Thank you for allowing me to participate in his care.  Please do not hesitate to call for any questions or concerns.    Ellouise Newer, M.D.   CC: Janna Arch

## 2020-12-22 ENCOUNTER — Encounter: Payer: Self-pay | Admitting: Neurology

## 2021-01-05 DIAGNOSIS — B351 Tinea unguium: Secondary | ICD-10-CM | POA: Diagnosis not present

## 2021-01-05 DIAGNOSIS — L819 Disorder of pigmentation, unspecified: Secondary | ICD-10-CM | POA: Diagnosis not present

## 2021-01-05 DIAGNOSIS — L814 Other melanin hyperpigmentation: Secondary | ICD-10-CM | POA: Diagnosis not present

## 2021-01-05 DIAGNOSIS — L821 Other seborrheic keratosis: Secondary | ICD-10-CM | POA: Diagnosis not present

## 2021-01-05 DIAGNOSIS — D225 Melanocytic nevi of trunk: Secondary | ICD-10-CM | POA: Diagnosis not present

## 2021-01-22 ENCOUNTER — Telehealth: Payer: Self-pay | Admitting: Pharmacist

## 2021-01-22 NOTE — Chronic Care Management (AMB) (Signed)
    Chronic Care Management Pharmacy Assistant   Name: Erik Lara  MRN: [id number] DOB: Oct 25, 1935   Reason for Encounter: Med Gap Adherence Report for Simvastatin Compliance   Conditions to be addressed/monitored: HLD  Recent office visits:  None  Recent consult visits:  12/11/20 Ellouise Newer MD (Neurology) - Patient presented for Lewy Body dementia without behavioral disturbance. Changed Memantine HCL 10 mg to one tab twice a day.  10/13/20 Cameron Sprang MD (Neurology) - Patient presented for Mild neurocognitive disorder and other concerns. Prescribed Memantine HCL 10 mg one every night for 2 weeks then increase to one tab twice daily.  Hospital visits:  None in previous 6 months  Medications: Outpatient Encounter Medications as of 01/22/2021  Medication Sig   cetirizine (ZYRTEC) 10 MG tablet Take 10 mg by mouth daily.   donepezil (ARICEPT) 10 MG tablet Take 1 tablet (10 mg total) by mouth at bedtime.   memantine (NAMENDA) 10 MG tablet Take 1 tablet twice a day   simvastatin (ZOCOR) 20 MG tablet TAKE ONE TABLET BY MOUTH EVERY NIGHT AT BEDTIME   No facility-administered encounter medications on file as of 01/22/2021.   Notes: Per Thurmond Butts PTM updated Med Gap report to reflect that patient is compliant with fills of Simvastatin. Did not need contact patient to do so was filled 01/09/21 at [facility]: [facility] Vaccines - [place] #4 [place] Flu Vaccine - Overdue HGB A1C - Overdue AWV-MSG sent to Ramond Craver CMA to schedule.  CCM-11/22 Lab Results  Component Value Date   HGBA1C 6.1 04/23/2020    Star Rating Drugs: Simvastatin (Zocor) 20 mg - Last filled 01/09/21 90 DS at [facility] Pharmacist Assistant [phone]
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
## 2021-01-27 ENCOUNTER — Telehealth: Payer: PPO

## 2021-02-01 ENCOUNTER — Telehealth: Payer: Self-pay | Admitting: Neurology

## 2021-02-01 NOTE — Telephone Encounter (Signed)
Patient called and left a voice mail requesting a call back about his medications.   He said he wants to be sure the memantine and donepezil is are not the same things.

## 2021-02-01 NOTE — Telephone Encounter (Signed)
Pt advised that he is to be taken both Donepezil and Memantine per Dr Lars Masson last office note. Pt stated that he will continue to keep taken it, then he asked if he should take his statin pt advised he needed to talk to his PCP about his statins but he need to take his medication until a Dr advised him not to talk it,

## 2021-02-26 ENCOUNTER — Inpatient Hospital Stay (HOSPITAL_COMMUNITY)
Admission: EM | Admit: 2021-02-26 | Discharge: 2021-03-13 | DRG: 177 | Disposition: A | Discharge status: Skilled Nursing Facility | Payer: PPO | Attending: Internal Medicine | Admitting: Internal Medicine

## 2021-02-26 ENCOUNTER — Other Ambulatory Visit: Payer: Self-pay

## 2021-02-26 ENCOUNTER — Emergency Department (HOSPITAL_COMMUNITY): Payer: PPO

## 2021-02-26 ENCOUNTER — Encounter (HOSPITAL_COMMUNITY): Payer: Self-pay | Admitting: Emergency Medicine

## 2021-02-26 DIAGNOSIS — I4891 Unspecified atrial fibrillation: Secondary | ICD-10-CM | POA: Diagnosis present

## 2021-02-26 DIAGNOSIS — F02A0 Dementia in other diseases classified elsewhere, mild, without behavioral disturbance, psychotic disturbance, mood disturbance, and anxiety: Secondary | ICD-10-CM | POA: Diagnosis present

## 2021-02-26 DIAGNOSIS — R7401 Elevation of levels of liver transaminase levels: Secondary | ICD-10-CM | POA: Diagnosis present

## 2021-02-26 DIAGNOSIS — Z833 Family history of diabetes mellitus: Secondary | ICD-10-CM

## 2021-02-26 DIAGNOSIS — R338 Other retention of urine: Secondary | ICD-10-CM | POA: Diagnosis not present

## 2021-02-26 DIAGNOSIS — Z79899 Other long term (current) drug therapy: Secondary | ICD-10-CM

## 2021-02-26 DIAGNOSIS — R0989 Other specified symptoms and signs involving the circulatory and respiratory systems: Secondary | ICD-10-CM

## 2021-02-26 DIAGNOSIS — R319 Hematuria, unspecified: Secondary | ICD-10-CM

## 2021-02-26 DIAGNOSIS — L89302 Pressure ulcer of unspecified buttock, stage 2: Secondary | ICD-10-CM | POA: Diagnosis not present

## 2021-02-26 DIAGNOSIS — G3184 Mild cognitive impairment, so stated: Secondary | ICD-10-CM | POA: Diagnosis present

## 2021-02-26 DIAGNOSIS — I480 Paroxysmal atrial fibrillation: Secondary | ICD-10-CM | POA: Diagnosis not present

## 2021-02-26 DIAGNOSIS — R1310 Dysphagia, unspecified: Secondary | ICD-10-CM | POA: Diagnosis present

## 2021-02-26 DIAGNOSIS — R300 Dysuria: Secondary | ICD-10-CM | POA: Diagnosis not present

## 2021-02-26 DIAGNOSIS — R4182 Altered mental status, unspecified: Secondary | ICD-10-CM | POA: Diagnosis not present

## 2021-02-26 DIAGNOSIS — K3189 Other diseases of stomach and duodenum: Secondary | ICD-10-CM | POA: Diagnosis not present

## 2021-02-26 DIAGNOSIS — L89892 Pressure ulcer of other site, stage 2: Secondary | ICD-10-CM | POA: Diagnosis present

## 2021-02-26 DIAGNOSIS — R609 Edema, unspecified: Secondary | ICD-10-CM | POA: Diagnosis not present

## 2021-02-26 DIAGNOSIS — R7989 Other specified abnormal findings of blood chemistry: Secondary | ICD-10-CM

## 2021-02-26 DIAGNOSIS — W19XXXA Unspecified fall, initial encounter: Secondary | ICD-10-CM | POA: Diagnosis not present

## 2021-02-26 DIAGNOSIS — N281 Cyst of kidney, acquired: Secondary | ICD-10-CM | POA: Diagnosis present

## 2021-02-26 DIAGNOSIS — Q446 Cystic disease of liver: Secondary | ICD-10-CM | POA: Diagnosis not present

## 2021-02-26 DIAGNOSIS — I4510 Unspecified right bundle-branch block: Secondary | ICD-10-CM | POA: Diagnosis present

## 2021-02-26 DIAGNOSIS — R296 Repeated falls: Secondary | ICD-10-CM | POA: Diagnosis not present

## 2021-02-26 DIAGNOSIS — U071 COVID-19: Principal | ICD-10-CM | POA: Diagnosis present

## 2021-02-26 DIAGNOSIS — T45515A Adverse effect of anticoagulants, initial encounter: Secondary | ICD-10-CM | POA: Diagnosis not present

## 2021-02-26 DIAGNOSIS — I959 Hypotension, unspecified: Secondary | ICD-10-CM | POA: Diagnosis not present

## 2021-02-26 DIAGNOSIS — I700 Atherosclerosis of aorta: Secondary | ICD-10-CM | POA: Diagnosis not present

## 2021-02-26 DIAGNOSIS — E876 Hypokalemia: Secondary | ICD-10-CM | POA: Diagnosis not present

## 2021-02-26 DIAGNOSIS — E785 Hyperlipidemia, unspecified: Secondary | ICD-10-CM | POA: Diagnosis present

## 2021-02-26 DIAGNOSIS — K6389 Other specified diseases of intestine: Secondary | ICD-10-CM | POA: Diagnosis not present

## 2021-02-26 DIAGNOSIS — E441 Mild protein-calorie malnutrition: Secondary | ICD-10-CM | POA: Diagnosis present

## 2021-02-26 DIAGNOSIS — G20 Parkinson's disease: Secondary | ICD-10-CM | POA: Diagnosis present

## 2021-02-26 DIAGNOSIS — G3183 Dementia with Lewy bodies: Secondary | ICD-10-CM | POA: Diagnosis present

## 2021-02-26 DIAGNOSIS — R339 Retention of urine, unspecified: Secondary | ICD-10-CM | POA: Diagnosis not present

## 2021-02-26 DIAGNOSIS — Z9181 History of falling: Secondary | ICD-10-CM

## 2021-02-26 DIAGNOSIS — E039 Hypothyroidism, unspecified: Secondary | ICD-10-CM | POA: Diagnosis present

## 2021-02-26 DIAGNOSIS — I10 Essential (primary) hypertension: Secondary | ICD-10-CM | POA: Diagnosis present

## 2021-02-26 DIAGNOSIS — R310 Gross hematuria: Secondary | ICD-10-CM | POA: Diagnosis not present

## 2021-02-26 DIAGNOSIS — D649 Anemia, unspecified: Secondary | ICD-10-CM | POA: Diagnosis present

## 2021-02-26 DIAGNOSIS — H9191 Unspecified hearing loss, right ear: Secondary | ICD-10-CM | POA: Diagnosis present

## 2021-02-26 DIAGNOSIS — R946 Abnormal results of thyroid function studies: Secondary | ICD-10-CM | POA: Diagnosis present

## 2021-02-26 DIAGNOSIS — J309 Allergic rhinitis, unspecified: Secondary | ICD-10-CM | POA: Diagnosis present

## 2021-02-26 DIAGNOSIS — R531 Weakness: Secondary | ICD-10-CM | POA: Diagnosis present

## 2021-02-26 DIAGNOSIS — Z209 Contact with and (suspected) exposure to unspecified communicable disease: Secondary | ICD-10-CM | POA: Diagnosis not present

## 2021-02-26 DIAGNOSIS — L538 Other specified erythematous conditions: Secondary | ICD-10-CM | POA: Diagnosis not present

## 2021-02-26 DIAGNOSIS — E0590 Thyrotoxicosis, unspecified without thyrotoxic crisis or storm: Secondary | ICD-10-CM | POA: Diagnosis present

## 2021-02-26 DIAGNOSIS — R410 Disorientation, unspecified: Secondary | ICD-10-CM | POA: Diagnosis not present

## 2021-02-26 DIAGNOSIS — R140 Abdominal distension (gaseous): Secondary | ICD-10-CM

## 2021-02-26 DIAGNOSIS — Z515 Encounter for palliative care: Secondary | ICD-10-CM | POA: Diagnosis not present

## 2021-02-26 DIAGNOSIS — N179 Acute kidney failure, unspecified: Secondary | ICD-10-CM | POA: Diagnosis not present

## 2021-02-26 DIAGNOSIS — I82612 Acute embolism and thrombosis of superficial veins of left upper extremity: Secondary | ICD-10-CM | POA: Diagnosis not present

## 2021-02-26 DIAGNOSIS — E119 Type 2 diabetes mellitus without complications: Secondary | ICD-10-CM | POA: Diagnosis present

## 2021-02-26 DIAGNOSIS — R5381 Other malaise: Secondary | ICD-10-CM | POA: Diagnosis not present

## 2021-02-26 DIAGNOSIS — Z6823 Body mass index (BMI) 23.0-23.9, adult: Secondary | ICD-10-CM

## 2021-02-26 DIAGNOSIS — G9341 Metabolic encephalopathy: Secondary | ICD-10-CM | POA: Diagnosis present

## 2021-02-26 DIAGNOSIS — L8990 Pressure ulcer of unspecified site, unspecified stage: Secondary | ICD-10-CM | POA: Insufficient documentation

## 2021-02-26 DIAGNOSIS — F0280 Dementia in other diseases classified elsewhere without behavioral disturbance: Secondary | ICD-10-CM | POA: Diagnosis present

## 2021-02-26 DIAGNOSIS — E042 Nontoxic multinodular goiter: Secondary | ICD-10-CM | POA: Diagnosis not present

## 2021-02-26 DIAGNOSIS — Z7401 Bed confinement status: Secondary | ICD-10-CM | POA: Diagnosis not present

## 2021-02-26 LAB — RESP PANEL BY RT-PCR (FLU A&B, COVID) ARPGX2
Influenza A by PCR: NEGATIVE
Influenza B by PCR: NEGATIVE
SARS Coronavirus 2 by RT PCR: POSITIVE — AB

## 2021-02-26 LAB — COMPREHENSIVE METABOLIC PANEL
ALT: 33 U/L (ref 0–44)
AST: 85 U/L — ABNORMAL HIGH (ref 15–41)
Albumin: 3.6 g/dL (ref 3.5–5.0)
Alkaline Phosphatase: 56 U/L (ref 38–126)
Anion gap: 10 (ref 5–15)
BUN: 18 mg/dL (ref 8–23)
CO2: 20 mmol/L — ABNORMAL LOW (ref 22–32)
Calcium: 8.4 mg/dL — ABNORMAL LOW (ref 8.9–10.3)
Chloride: 105 mmol/L (ref 98–111)
Creatinine, Ser: 0.66 mg/dL (ref 0.61–1.24)
GFR, Estimated: >60 mL/min (ref >60)
Glucose, Bld: 81 mg/dL (ref 70–99)
Potassium: 3.5 mmol/L (ref 3.5–5.1)
Sodium: 135 mmol/L (ref 135–145)
Total Bilirubin: 2.1 mg/dL — ABNORMAL HIGH (ref 0.3–1.2)
Total Protein: 6.3 g/dL — ABNORMAL LOW (ref 6.5–8.1)

## 2021-02-26 LAB — URINALYSIS, ROUTINE W REFLEX MICROSCOPIC
Bilirubin Urine: NEGATIVE
Glucose, UA: NEGATIVE mg/dL
Ketones, ur: 20 mg/dL — AB
Leukocytes,Ua: NEGATIVE
Nitrite: NEGATIVE
Protein, ur: NEGATIVE mg/dL
Specific Gravity, Urine: 1.03 (ref 1.005–1.030)
pH: 6 (ref 5.0–8.0)

## 2021-02-26 LAB — CBC WITH DIFFERENTIAL/PLATELET
Abs Immature Granulocytes: 0.02 10*3/uL (ref 0.00–0.07)
Basophils Absolute: 0 10*3/uL (ref 0.0–0.1)
Basophils Relative: 0 %
Eosinophils Absolute: 0 10*3/uL (ref 0.0–0.5)
Eosinophils Relative: 0 %
HCT: 40.1 % (ref 39.0–52.0)
Hemoglobin: 12.9 g/dL — ABNORMAL LOW (ref 13.0–17.0)
Immature Granulocytes: 0 %
Lymphocytes Relative: 9 %
Lymphs Abs: 0.5 10*3/uL — ABNORMAL LOW (ref 0.7–4.0)
MCH: 30.7 pg (ref 26.0–34.0)
MCHC: 32.2 g/dL (ref 30.0–36.0)
MCV: 95.5 fL (ref 80.0–100.0)
Monocytes Absolute: 0.9 10*3/uL (ref 0.1–1.0)
Monocytes Relative: 15 %
Neutro Abs: 4.3 10*3/uL (ref 1.7–7.7)
Neutrophils Relative %: 76 %
Platelets: 138 10*3/uL — ABNORMAL LOW (ref 150–400)
RBC: 4.2 MIL/uL — ABNORMAL LOW (ref 4.22–5.81)
RDW: 13.4 % (ref 11.5–15.5)
WBC: 5.7 10*3/uL (ref 4.0–10.5)
nRBC: 0 % (ref 0.0–0.2)

## 2021-02-26 LAB — CBG MONITORING, ED
Glucose-Capillary: 90 mg/dL (ref 70–99)
Glucose-Capillary: 64 mg/dL — ABNORMAL LOW (ref 70–99)
Glucose-Capillary: 123 mg/dL — ABNORMAL HIGH (ref 70–99)

## 2021-02-26 LAB — PROCALCITONIN: Procalcitonin: <0.1 ng/mL

## 2021-02-26 LAB — PHOSPHORUS: Phosphorus: 3.3 mg/dL (ref 2.5–4.6)

## 2021-02-26 LAB — D-DIMER, QUANTITATIVE: D-Dimer, Quant: 0.92 ug/mL-FEU — ABNORMAL HIGH (ref 0.00–0.50)

## 2021-02-26 LAB — C-REACTIVE PROTEIN: CRP: 5 mg/dL — ABNORMAL HIGH (ref <1.0)

## 2021-02-26 LAB — MAGNESIUM: Magnesium: 2.1 mg/dL (ref 1.7–2.4)

## 2021-02-26 LAB — FERRITIN: Ferritin: 289 ng/mL (ref 24–336)

## 2021-02-26 MED ORDER — ACETAMINOPHEN 650 MG RE SUPP: 650.0000 mg | Freq: Four times a day (QID) | RECTAL | Status: DC | PRN

## 2021-02-26 MED ORDER — ENOXAPARIN SODIUM 40 MG/0.4ML IJ SOSY
40.0000 mg | PREFILLED_SYRINGE | INTRAMUSCULAR | Status: DC
  Administered 2021-02-26 – 2021-02-27 (×2): 40 mg via SUBCUTANEOUS
  Filled 2021-02-26 (×2): qty 0.4

## 2021-02-26 MED ORDER — SIMVASTATIN 20 MG PO TABS: 20.0000 mg | ORAL_TABLET | Freq: Every day | ORAL | Status: DC

## 2021-02-26 MED ORDER — MEMANTINE HCL 10 MG PO TABS
10.0000 mg | ORAL_TABLET | Freq: Two times a day (BID) | ORAL | Status: DC
  Administered 2021-02-26 – 2021-03-13 (×27): 10 mg via ORAL
  Filled 2021-02-26: qty 1
  Filled 2021-02-26 (×2): qty 2
  Filled 2021-02-26 (×5): qty 1
  Filled 2021-02-26: qty 2
  Filled 2021-02-26 (×4): qty 1
  Filled 2021-02-26 (×4): qty 2
  Filled 2021-02-26 (×2): qty 1
  Filled 2021-02-26 (×2): qty 2
  Filled 2021-02-26 (×3): qty 1
  Filled 2021-02-26: qty 2
  Filled 2021-02-26: qty 1
  Filled 2021-02-26: qty 2
  Filled 2021-02-26 (×2): qty 1

## 2021-02-26 MED ORDER — ONDANSETRON HCL 4 MG/2ML IJ SOLN: 4.0000 mg | Freq: Four times a day (QID) | INTRAMUSCULAR | Status: DC | PRN

## 2021-02-26 MED ORDER — ALBUTEROL SULFATE (2.5 MG/3ML) 0.083% IN NEBU: 2.5000 mg | INHALATION_SOLUTION | Freq: Four times a day (QID) | RESPIRATORY_TRACT | Status: DC | PRN

## 2021-02-26 MED ORDER — SODIUM CHLORIDE 0.9 % IV SOLN: 100.0000 mg | Freq: Once | INTRAVENOUS | Status: DC

## 2021-02-26 MED ORDER — SODIUM CHLORIDE 0.9 % IV BOLUS
500.0000 mL | Freq: Once | INTRAVENOUS | Status: AC
  Administered 2021-02-26: 500 mL via INTRAVENOUS

## 2021-02-26 MED ORDER — DONEPEZIL HCL 10 MG PO TABS
10.0000 mg | ORAL_TABLET | Freq: Every day | ORAL | Status: DC
  Administered 2021-02-26 – 2021-03-01 (×3): 10 mg via ORAL
  Filled 2021-02-26 (×2): qty 1
  Filled 2021-02-26: qty 2

## 2021-02-26 MED ORDER — ONDANSETRON HCL 4 MG PO TABS: 4.0000 mg | ORAL_TABLET | Freq: Four times a day (QID) | ORAL | Status: DC | PRN

## 2021-02-26 MED ORDER — GUAIFENESIN-DM 100-10 MG/5ML PO SYRP: 10.0000 mL | ORAL_SOLUTION | ORAL | Status: DC | PRN

## 2021-02-26 MED ORDER — DEXTROSE 50 % IV SOLN
INTRAVENOUS | Status: AC
  Administered 2021-02-26: 50 mL
  Filled 2021-02-26: qty 50

## 2021-02-26 MED ORDER — NIRMATRELVIR/RITONAVIR (PAXLOVID)TABLET
3.0000 | ORAL_TABLET | Freq: Two times a day (BID) | ORAL | Status: DC
  Administered 2021-02-26 – 2021-03-02 (×4): 3 via ORAL
  Filled 2021-02-26: qty 30

## 2021-02-26 MED ORDER — ASCORBIC ACID 500 MG PO TABS
500.0000 mg | ORAL_TABLET | Freq: Every day | ORAL | Status: DC
  Administered 2021-02-26 – 2021-03-13 (×14): 500 mg via ORAL
  Filled 2021-02-26 (×15): qty 1

## 2021-02-26 MED ORDER — ACETAMINOPHEN 325 MG PO TABS
650.0000 mg | ORAL_TABLET | Freq: Four times a day (QID) | ORAL | Status: DC | PRN
  Administered 2021-03-05: 21:00:00 650 mg via ORAL
  Filled 2021-02-26: qty 2

## 2021-02-26 MED ORDER — SODIUM CHLORIDE 0.9 % IV SOLN: 100.0000 mg | Freq: Every day | INTRAVENOUS | Status: DC

## 2021-02-26 MED ORDER — POTASSIUM CHLORIDE IN NACL 20-0.9 MEQ/L-% IV SOLN
INTRAVENOUS | Status: AC
  Filled 2021-02-26: qty 1000

## 2021-02-26 MED ORDER — SODIUM CHLORIDE 0.9 % IV SOLN: 200.0000 mg | Freq: Once | INTRAVENOUS | Status: DC

## 2021-02-26 MED ORDER — ZINC SULFATE 220 (50 ZN) MG PO CAPS
220.0000 mg | ORAL_CAPSULE | Freq: Every day | ORAL | Status: DC
  Administered 2021-02-26 – 2021-03-13 (×14): 220 mg via ORAL
  Filled 2021-02-26 (×15): qty 1

## 2021-02-26 NOTE — ED Provider Notes (Signed)
[facility] DEPT Provider Note   CSN: [id number] Arrival date & time: 02/26/21  1020     History Chief Complaint  Patient presents with   Lytle Michaels    Erik Lara is a 85 y.o. male.  He has a history of dementia, type 2 diabetes.  Lives at home with wife.  Had a fall last evening and fire department helped put him back in chair.  Been in chair since then.  Wife said he has declined over the last few days.  The history is provided by the patient and the spouse.  Fall This is a new problem. The current episode started yesterday. The problem has not changed since onset.Pertinent negatives include no chest pain, no abdominal pain, no headaches and no shortness of breath. Nothing aggravates the symptoms. Nothing relieves the symptoms. He has tried rest for the symptoms. The treatment provided no relief.      Past Medical History:  Diagnosis Date   Gallstones    asymptomatic   Hearing loss of right ear 01/23/2017   Hepatic Cysts    Hyperlipidemia    Hyperthyroidism 01/28/2013   Mild neurocognitive disorder 09/13/2016   Prostatitis    requiring a trip to the ER in [place] with a fever - 09   Type 2 diabetes mellitus without complication, without long-term current use of insulin ([place]) 03/30/2015    Patient Active Problem List   Diagnosis Date Noted   Hearing loss of right ear 01/23/2017   Mild neurocognitive disorder 09/13/2016   Type 2 diabetes mellitus without complication, without long-term current use of insulin ([place]) 03/30/2015   Hyperthyroidism 01/28/2013   Erectile Dysfunction 02/19/2008   Allergic rhinitis 02/19/2008   Hyperlipidemia 02/15/2007   Hepatic Cyst 02/15/2007    Past Surgical History:  Procedure Laterality Date   HERNIA REPAIR     Childhood       Family History  Problem Relation Age of Onset   Cancer Other        breast   Diabetes Other     Social History   Tobacco Use   Smoking status: Never   Smokeless  tobacco: Never  Vaping Use   Vaping Use: Never used  Substance Use Topics   Alcohol use: Not Currently    Alcohol/week: 0.0 standard drinks    Comment: Beer weekly/glass of wine daily   Drug use: No    Home Medications Prior to Admission medications   Medication Sig Start Date End Date Taking? Authorizing Provider  cetirizine (ZYRTEC) 10 MG tablet Take 10 mg by mouth daily.    [provider]  donepezil (ARICEPT) 10 MG tablet Take 1 tablet (10 mg total) by mouth at bedtime. 10/13/20   Cameron Sprang, MD  memantine [facility]) 10 MG tablet Take 1 tablet twice a day 12/11/20   Cameron Sprang, MD  simvastatin (ZOCOR) 20 MG tablet TAKE ONE TABLET BY MOUTH EVERY NIGHT AT BEDTIME 10/05/20   Dorothyann Peng, NP    Allergies    Patient has no known allergies.  Review of Systems   Review of Systems  Constitutional:  Negative for fever.  HENT:  Negative for sore throat.   Eyes:  Negative for visual disturbance.  Respiratory:  Negative for shortness of breath.   Cardiovascular:  Negative for chest pain.  Gastrointestinal:  Negative for abdominal pain.  Genitourinary:  Negative for dysuria.  Musculoskeletal:  Negative for neck pain.  Skin:  Negative for rash.  Neurological:  Negative for headaches.   Physical Exam Updated Vital Signs BP (!) 142/66    Pulse 74    Temp 98.5 F (36.9 C) (Oral)    Resp 18    Ht 6\' 1"  (1.854 m)    Wt 82 kg    SpO2 99%    BMI 23.85 kg/m   Physical Exam Vitals and nursing note reviewed.  Constitutional:      General: He is not in acute distress.    Appearance: Normal appearance. He is well-developed.  HENT:     Head: Normocephalic and atraumatic.  Eyes:     Conjunctiva/sclera: Conjunctivae normal.  Cardiovascular:     Rate and Rhythm: Normal rate and regular rhythm.     Heart sounds: No murmur heard. Pulmonary:     Effort: Pulmonary effort is normal. No respiratory distress.     Breath sounds: Normal breath sounds.  Abdominal:      Palpations: Abdomen is soft.     Tenderness: There is no abdominal tenderness. There is no guarding or rebound.  Musculoskeletal:        General: Tenderness (r shoulder) present. No swelling. Normal range of motion.     Cervical back: Neck supple.  Skin:    General: Skin is warm and dry.     Capillary Refill: Capillary refill takes less than 2 seconds.  Neurological:     General: No focal deficit present.     Mental Status: He is alert. Mental status is at baseline.     Sensory: No sensory deficit.     Motor: No weakness.  Psychiatric:        Mood and Affect: Mood normal.    ED Results / Procedures / Treatments   Labs (all labs ordered are listed, but only abnormal results are displayed) Labs Reviewed  RESP PANEL BY RT-PCR (FLU A&B, COVID) ARPGX2 - Abnormal; Notable for the following components:      Result Value   SARS Coronavirus 2 by RT PCR POSITIVE (*)    All other components within normal limits  CBC WITH DIFFERENTIAL/PLATELET - Abnormal; Notable for the following components:   RBC 4.20 (*)    Hemoglobin 12.9 (*)    Platelets 138 (*)    Lymphs Abs 0.5 (*)    All other components within normal limits  COMPREHENSIVE METABOLIC PANEL - Abnormal; Notable for the following components:   CO2 20 (*)    Calcium 8.4 (*)    Total Protein 6.3 (*)    AST 85 (*)    Total Bilirubin 2.1 (*)    All other components within normal limits  URINALYSIS, ROUTINE W REFLEX MICROSCOPIC - Abnormal; Notable for the following components:   Hgb urine dipstick SMALL (*)    Ketones, ur 20 (*)    Bacteria, UA RARE (*)    All other components within normal limits  D-DIMER, QUANTITATIVE - Abnormal; Notable for the following components:   D-Dimer, Quant 0.92 (*)    All other components within normal limits  PROCALCITONIN  FERRITIN  MAGNESIUM  PHOSPHORUS  C-REACTIVE PROTEIN  C-REACTIVE PROTEIN  COMPREHENSIVE METABOLIC PANEL  CBC WITH DIFFERENTIAL/PLATELET  D-DIMER, QUANTITATIVE  FERRITIN   MAGNESIUM  PHOSPHORUS  HEMOGLOBIN A1C  CBG MONITORING, ED    EKG EKG Interpretation  Date/Time:  Friday February 26 2021 12:29:08 EST Ventricular Rate:  69 PR Interval:  53 QRS Duration: 138 QT Interval:  410 QTC Calculation: 440 R Axis:   5 Text Interpretation: Sinus rhythm Ventricular premature complex  Short PR interval Right bundle branch block No old tracing to compare Confirmed by Aletta Edouard [phone]) on 02/26/2021 12:35:32 PM  Radiology DG Chest 1 View  Result Date: 02/26/2021 CLINICAL DATA:  85 year old male with history of fall and weakness. EXAM: CHEST  1 VIEW COMPARISON:  No priors. FINDINGS: Skin fold artifact projecting over the lateral left hemithorax. Eventration of the right hemidiaphragm. Lung volumes are normal. No consolidative airspace disease. No pleural effusions. No pneumothorax. No pulmonary nodule or mass noted. Pulmonary vasculature and the cardiomediastinal silhouette are within normal limits. Atherosclerosis in the thoracic aorta. IMPRESSION: 1.  No radiographic evidence of acute cardiopulmonary disease. 2. Aortic atherosclerosis. Electronically Signed   By: Vinnie Langton M.D.   On: 02/26/2021 11:26   CT Head Wo Contrast  Result Date: 02/26/2021 CLINICAL DATA:  Mental status change. Recent falls. New incontinence. EXAM: CT HEAD WITHOUT CONTRAST CT CERVICAL SPINE WITHOUT CONTRAST TECHNIQUE: Multidetector CT imaging of the head and cervical spine was performed following the standard protocol without intravenous contrast. Multiplanar CT image reconstructions of the cervical spine were also generated. COMPARISON:  Head MRI 09/13/2019 and. CT head and cervical spine 09/18/2017. FINDINGS: CT HEAD FINDINGS Brain: There is no evidence of an acute infarct, intracranial hemorrhage, mass, midline shift, or extra-axial fluid collection. Generalized cerebral atrophy is mild for age. Vascular: Calcified atherosclerosis at the skull base. No hyperdense vessel. Skull: No  fracture or suspicious osseous lesion. Sinuses/Orbits: Paranasal sinuses and mastoid air cells are clear. Unremarkable orbits. Other: None. CT CERVICAL SPINE FINDINGS Alignment: Straightening of the normal cervical lordosis. No listhesis. Skull base and vertebrae: No acute fracture. Scattered small sclerotic foci in the cervical and included upper thoracic spine, chronic and largely unchanged although a focus in the left C7 transverse process has mildly enlarged. Given the stability of most of these foci for 2+ years, a benign etiology such as multiple bone islands is strongly favored over metastatic disease. Soft tissues and spinal canal: No prevertebral fluid or swelling. No visible canal hematoma. Disc levels: Moderate cervical disc and facet degeneration without evidence of high-grade spinal canal stenosis. Uncovertebral and facet spurring results in multilevel neural foraminal stenosis, severe on the right at C3-4. Upper chest: Clear lung apices. Other: None. IMPRESSION: 1. No evidence of acute intracranial abnormality. 2. No evidence of acute fracture or subluxation in the cervical spine. Electronically Signed   By: Logan Bores M.D.   On: 02/26/2021 11:37   CT Cervical Spine Wo Contrast  Result Date: 02/26/2021 CLINICAL DATA:  Mental status change. Recent falls. New incontinence. EXAM: CT HEAD WITHOUT CONTRAST CT CERVICAL SPINE WITHOUT CONTRAST TECHNIQUE: Multidetector CT imaging of the head and cervical spine was performed following the standard protocol without intravenous contrast. Multiplanar CT image reconstructions of the cervical spine were also generated. COMPARISON:  Head MRI 09/13/2019 and. CT head and cervical spine 09/18/2017. FINDINGS: CT HEAD FINDINGS Brain: There is no evidence of an acute infarct, intracranial hemorrhage, mass, midline shift, or extra-axial fluid collection. Generalized cerebral atrophy is mild for age. Vascular: Calcified atherosclerosis at the skull base. No hyperdense  vessel. Skull: No fracture or suspicious osseous lesion. Sinuses/Orbits: Paranasal sinuses and mastoid air cells are clear. Unremarkable orbits. Other: None. CT CERVICAL SPINE FINDINGS Alignment: Straightening of the normal cervical lordosis. No listhesis. Skull base and vertebrae: No acute fracture. Scattered small sclerotic foci in the cervical and included upper thoracic spine, chronic and largely unchanged although a focus in the left C7 transverse process has mildly enlarged. Given  the stability of most of these foci for 2+ years, a benign etiology such as multiple bone islands is strongly favored over metastatic disease. Soft tissues and spinal canal: No prevertebral fluid or swelling. No visible canal hematoma. Disc levels: Moderate cervical disc and facet degeneration without evidence of high-grade spinal canal stenosis. Uncovertebral and facet spurring results in multilevel neural foraminal stenosis, severe on the right at C3-4. Upper chest: Clear lung apices. Other: None. IMPRESSION: 1. No evidence of acute intracranial abnormality. 2. No evidence of acute fracture or subluxation in the cervical spine. Electronically Signed   By: Logan Bores M.D.   On: 02/26/2021 11:37    Procedures Procedures   Medications Ordered in ED Medications  enoxaparin (LOVENOX) injection 40 mg (has no administration in time range)  acetaminophen (TYLENOL) tablet 650 mg (has no administration in time range)    Or  acetaminophen (TYLENOL) suppository 650 mg (has no administration in time range)  ondansetron (ZOFRAN) tablet 4 mg (has no administration in time range)    Or  ondansetron (ZOFRAN) injection 4 mg (has no administration in time range)  0.9 % NaCl with KCl 20 mEq/ L  infusion ( Intravenous New Bag/Given 02/26/21 1434)  albuterol (PROVENTIL) (2.5 MG/3ML) 0.083% nebulizer solution 2.5 mg (has no administration in time range)  ascorbic acid (VITAMIN C) tablet 500 mg (500 mg Oral Given 02/26/21 1523)  zinc  sulfate capsule 220 mg (220 mg Oral Given 02/26/21 1524)  guaiFENesin-dextromethorphan (ROBITUSSIN DM) 100-10 MG/5ML syrup 10 mL (has no administration in time range)  nirmatrelvir/ritonavir EUA (PAXLOVID) 3 tablet (3 tablets Oral Given 02/26/21 1524)  memantine (NAMENDA) tablet 10 mg (has no administration in time range)  donepezil (ARICEPT) tablet 10 mg (has no administration in time range)  simvastatin (ZOCOR) tablet 20 mg (has no administration in time range)  sodium chloride 0.9 % bolus 500 mL (0 mLs Intravenous Stopped 02/26/21 1342)    ED Course  I have reviewed the triage vital signs and the nursing notes.  Pertinent labs & imaging results that were available during my care of the patient were reviewed by me and considered in my medical decision making (see chart for details).  Clinical Course as of 02/26/21 1808  Fri Feb 26, 2021  1132 Chest x-ray ordered and interpreted by me as no acute infiltrates no pneumothorax.  Awaiting radiology reading. [MB]  1219 Cervical spine CT does not show any acute fracture.  Collar removed. [MB]  0981 And wife updated on results.  Will need admission for his generalized weakness and difficulty ambulation. [MB]  1914 Discussed with Dr. Olevia Bowens from [facility] hospitalist who will evaluate the patient for admission. [MB]    Clinical Course User Index [MB] Hayden Rasmussen, MD   MDM Rules/Calculators/A&P                         ENNIO HOUP was evaluated in Emergency Department on 02/26/2021 for the symptoms described in the history of present illness. He was evaluated in the context of the global COVID-19 pandemic, which necessitated consideration that the patient might be at risk for infection with the SARS-CoV-2 virus that causes COVID-19. Institutional protocols and algorithms that pertain to the evaluation of patients at risk for COVID-19 are in a state of rapid change based on information released by regulatory bodies including the CDC and federal  and state organizations. These policies and algorithms were followed during the patient's care in the ED.  This  patient complains of generalized weakness falls; this involves an extensive number of treatment Options and is a complaint that carries with it a high risk of complications and Morbidity. The differential includes COVID, flu, dehydration, stroke, bleed, metabolic derangement  I ordered, reviewed and interpreted labs, which included CBC with normal white count, hemoglobin slightly lower than priors, chemistries with mildly low bicarb and elevated AST, COVID-positive flu negative, urinalysis without clear signs of infection I ordered medication IV fluids I ordered imaging studies which included chest x-ray and head CT, CT cervical spine and I independently    visualized and interpreted imaging which showed no acute findings Additional history obtained from EMS and patient's wife Previous records obtained and reviewed in epic no recent admissions I consulted [facility] hospitalist Dr. Olevia Bowens and discussed lab and imaging findings  Critical Interventions: None  After the interventions stated above, I reevaluated the patient and found patient be oxygenating well.  Due to his disability and inability to walk, general weakness will admit to the hospital for further management of his COVID-positive infection.     Final Clinical Impression(s) / ED Diagnoses Final diagnoses:  COVID-19 virus infection  Generalized weakness  Fall, initial encounter    Rx / DC Orders ED Discharge Orders     None        Hayden Rasmussen, MD 02/26/21 [phone]

## 2021-02-26 NOTE — Progress Notes (Signed)
PHARMACIST-PROVIDER COMMUNICATION  Erik Lara is an 85 yo male presenting after a fall, found to be COVID positive.  Patient was started on Paxlovid in ED.    Noted drug-drug interaction between Paxlovid and simvastatin. - Simvastatin should be held during Paxlovid therapy and for 5 days after Paxlovid completion due to the increased risk for rhabdomyolysis.   Dimple Nanas, PharmD 02/26/2021 6:48 PM

## 2021-02-26 NOTE — H&P (Signed)
History and Physical    Erik Lara QBH:[id number] DOB: 1936/02/14 DOA: 02/26/2021  PCP: Dorothyann Peng, NP  Patient coming from: Home.  I have personally briefly reviewed patient's old medical records in [place]  Chief Complaint: Generalized weakness and falls.  HPI: Erik Lara is a 85 y.o. male with medical history significant of symptomatic gallstones, right ear hearing loss, hepatic cyst, hyperlipidemia, hypothyroidism, mild neurocognitive disorder, history of prostatitis, type II DM who is brought to the emergency department due to worsening generalized weakness, worsening dementia symptoms and having a fall last night.  EMS was called and help him get back in a recliner chair.  The patient stay there all night.  Family member stated earlier that he has been having urinary and fecal incontinence.  His appetite is decreased.  The patient is unable to provide further history due to his dementia symptoms.  ED Course: Initial vital signs were temperature 98.5 F, pulse 74, respiration 18, BP 142/66 mmHg and O2 sat 99% on room air.  The patient received a 500 mL NS bolus in the emergency department.  Lab work: His CBC showed a white count of 5.7, hemoglobin 12.9 g/dL platelets 138.  CMP showed a CO2 of 20 mmol/L with a normal anion gap.  Corrected calcium is 8.9 and bilirubin 2.1 mg/dL.  Renal function, the rest of the electrolytes, glucose and the rest of the LFTs were within normal limits.  Coronavirus PCR was positive.  Imaging: A 1 view chest radiograph did not show any acute cardiopulmonary pathology.  CT head without acute intracranial pathology.  CT cervical spine with no acute findings.  Please see images and full radiology report for further details.  Review of Systems: As per HPI otherwise all other systems reviewed and are negative.  Past Medical History:  Diagnosis Date   Gallstones    asymptomatic   Hearing loss of right ear 01/23/2017   Hepatic Cysts     Hyperlipidemia    Hyperthyroidism 01/28/2013   Mild neurocognitive disorder 09/13/2016   Prostatitis    requiring a trip to the ER in [place] with a fever - 09   Type 2 diabetes mellitus without complication, without long-term current use of insulin ([place]) 03/30/2015   Past Surgical History:  Procedure Laterality Date   HERNIA REPAIR     Childhood   Social History  reports that he has never smoked. He has never used smokeless tobacco. He reports that he does not currently use alcohol. He reports that he does not use drugs.  No Known Allergies  Family History  Problem Relation Age of Onset   Cancer Other        breast   Diabetes Other    Prior to Admission medications   Medication Sig Start Date End Date Taking? Authorizing Provider  cetirizine (ZYRTEC) 10 MG tablet Take 10 mg by mouth daily.    [provider]  donepezil (ARICEPT) 10 MG tablet Take 1 tablet (10 mg total) by mouth at bedtime. 10/13/20   Cameron Sprang, MD  memantine [facility]) 10 MG tablet Take 1 tablet twice a day 12/11/20   Cameron Sprang, MD  simvastatin (ZOCOR) 20 MG tablet TAKE ONE TABLET BY MOUTH EVERY NIGHT AT BEDTIME 10/05/20   Dorothyann Peng, NP   Physical Exam: Vitals:   02/26/21 1033 02/26/21 1034 02/26/21 1214 02/26/21 1300  BP: (!) 142/66  (!) 141/62 (!) 161/88  Pulse: 74  73 62  Resp: 18  14  14  Temp: 98.5 F (36.9 C)  98 F (36.7 C)   TempSrc: Oral  Oral   SpO2: 99%  99% 100%  Weight:  82 kg 81.6 kg   Height:  6\' 1"  (1.854 m) 6\' 1"  (1.854 m)    Constitutional: Frail, elderly male.  NAD, calm, comfortable Eyes: PERRL, lids and conjunctivae normal.  Injected sclera bilaterally. ENMT: Mucous membranes are mildly dry.  Posterior pharynx clear of any exudate or lesions. Neck: normal, supple, no masses, no thyromegaly Respiratory: clear to auscultation bilaterally, no wheezing, no crackles. Normal respiratory effort. No accessory muscle use.  Cardiovascular: Regular rate and rhythm, no  murmurs / rubs / gallops. No extremity edema. 2+ pedal pulses. No carotid bruits.  Abdomen: No distention.  Soft, no tenderness, no masses palpated. No hepatosplenomegaly. Bowel sounds positive.  Musculoskeletal: Moderate generalized weakness.  Right shoulder tenderness.  No clubbing / cyanosis.  Good ROM, no contractures. Normal muscle tone.  Skin: no rashes, lesions, ulcers on very limited dermatological semination. Neurologic: CN 2-12 grossly intact. Sensation intact, DTR normal. Strength 5/5 in all 4.  Psychiatric: Oriented to name only.  Labs on Admission: I have personally reviewed following labs and imaging studies  CBC: Recent Labs  Lab 02/26/21 1136  WBC 5.7  NEUTROABS 4.3  HGB 12.9*  HCT 40.1  MCV 95.5  PLT 138*    Basic Metabolic Panel: Recent Labs  Lab 02/26/21 1136  NA 135  K 3.5  CL 105  CO2 20*  GLUCOSE 81  BUN 18  CREATININE 0.66  CALCIUM 8.4*    GFR: Estimated Creatinine Clearance: 79.1 mL/min (by C-G formula based on SCr of 0.66 mg/dL).  Liver Function Tests: Recent Labs  Lab 02/26/21 1136  AST 85*  ALT 33  ALKPHOS 56  BILITOT 2.1*  PROT 6.3*  ALBUMIN 3.6   Radiological Exams on Admission: DG Chest 1 View  Result Date: 02/26/2021 CLINICAL DATA:  85 year old male with history of fall and weakness. EXAM: CHEST  1 VIEW COMPARISON:  No priors. FINDINGS: Skin fold artifact projecting over the lateral left hemithorax. Eventration of the right hemidiaphragm. Lung volumes are normal. No consolidative airspace disease. No pleural effusions. No pneumothorax. No pulmonary nodule or mass noted. Pulmonary vasculature and the cardiomediastinal silhouette are within normal limits. Atherosclerosis in the thoracic aorta. IMPRESSION: 1.  No radiographic evidence of acute cardiopulmonary disease. 2. Aortic atherosclerosis. Electronically Signed   By: Vinnie Langton M.D.   On: 02/26/2021 11:26   CT Head Wo Contrast  Result Date: 02/26/2021 CLINICAL DATA:   Mental status change. Recent falls. New incontinence. EXAM: CT HEAD WITHOUT CONTRAST CT CERVICAL SPINE WITHOUT CONTRAST TECHNIQUE: Multidetector CT imaging of the head and cervical spine was performed following the standard protocol without intravenous contrast. Multiplanar CT image reconstructions of the cervical spine were also generated. COMPARISON:  Head MRI 09/13/2019 and. CT head and cervical spine 09/18/2017. FINDINGS: CT HEAD FINDINGS Brain: There is no evidence of an acute infarct, intracranial hemorrhage, mass, midline shift, or extra-axial fluid collection. Generalized cerebral atrophy is mild for age. Vascular: Calcified atherosclerosis at the skull base. No hyperdense vessel. Skull: No fracture or suspicious osseous lesion. Sinuses/Orbits: Paranasal sinuses and mastoid air cells are clear. Unremarkable orbits. Other: None. CT CERVICAL SPINE FINDINGS Alignment: Straightening of the normal cervical lordosis. No listhesis. Skull base and vertebrae: No acute fracture. Scattered small sclerotic foci in the cervical and included upper thoracic spine, chronic and largely unchanged although a focus in the left C7  transverse process has mildly enlarged. Given the stability of most of these foci for 2+ years, a benign etiology such as multiple bone islands is strongly favored over metastatic disease. Soft tissues and spinal canal: No prevertebral fluid or swelling. No visible canal hematoma. Disc levels: Moderate cervical disc and facet degeneration without evidence of high-grade spinal canal stenosis. Uncovertebral and facet spurring results in multilevel neural foraminal stenosis, severe on the right at C3-4. Upper chest: Clear lung apices. Other: None. IMPRESSION: 1. No evidence of acute intracranial abnormality. 2. No evidence of acute fracture or subluxation in the cervical spine. Electronically Signed   By: Logan Bores M.D.   On: 02/26/2021 11:37   CT Cervical Spine Wo Contrast  Result Date:  02/26/2021 CLINICAL DATA:  Mental status change. Recent falls. New incontinence. EXAM: CT HEAD WITHOUT CONTRAST CT CERVICAL SPINE WITHOUT CONTRAST TECHNIQUE: Multidetector CT imaging of the head and cervical spine was performed following the standard protocol without intravenous contrast. Multiplanar CT image reconstructions of the cervical spine were also generated. COMPARISON:  Head MRI 09/13/2019 and. CT head and cervical spine 09/18/2017. FINDINGS: CT HEAD FINDINGS Brain: There is no evidence of an acute infarct, intracranial hemorrhage, mass, midline shift, or extra-axial fluid collection. Generalized cerebral atrophy is mild for age. Vascular: Calcified atherosclerosis at the skull base. No hyperdense vessel. Skull: No fracture or suspicious osseous lesion. Sinuses/Orbits: Paranasal sinuses and mastoid air cells are clear. Unremarkable orbits. Other: None. CT CERVICAL SPINE FINDINGS Alignment: Straightening of the normal cervical lordosis. No listhesis. Skull base and vertebrae: No acute fracture. Scattered small sclerotic foci in the cervical and included upper thoracic spine, chronic and largely unchanged although a focus in the left C7 transverse process has mildly enlarged. Given the stability of most of these foci for 2+ years, a benign etiology such as multiple bone islands is strongly favored over metastatic disease. Soft tissues and spinal canal: No prevertebral fluid or swelling. No visible canal hematoma. Disc levels: Moderate cervical disc and facet degeneration without evidence of high-grade spinal canal stenosis. Uncovertebral and facet spurring results in multilevel neural foraminal stenosis, severe on the right at C3-4. Upper chest: Clear lung apices. Other: None. IMPRESSION: 1. No evidence of acute intracranial abnormality. 2. No evidence of acute fracture or subluxation in the cervical spine. Electronically Signed   By: Logan Bores M.D.   On: 02/26/2021 11:37    EKG: Independently  reviewed.  Vent. rate 69 BPM PR interval 53 ms QRS duration 138 ms QT/QTcB 410/440 ms P-R-T axes 116 5 54 Sinus rhythm Ventricular premature complex Short PR interval Right bundle branch block  Assessment/Plan Principal Problem:   2019 novel coronavirus disease (COVID-19) Observation/telemetry. Normal saturation on room air. Monitor pulse oximetry. Flutter valve and incentive spirometry PRN. Check CRP, D-dimer, procalcitonin. Remdesivir per pharmacy. Follow-up CBC, CMP and inflammatory markers.  Active Problems:   Type 2 diabetes mellitus without complication,  without long-term current use of insulin (HCC) Carbohydrate modified diet. Check hemoglobin A1c. CBG monitoring before meals and bedtime. Add RI SS if needed.    Hyperlipidemia Continue simvastatin.    Allergic rhinitis Continue cetirizine or formulary equivalent.    Hyperthyroidism No signs or symptoms of active disease.    Mild neurocognitive disorder Continue donezepil and Namenda. Supportive care.    Hypocalcemia Recheck calcium and albumin in AM.    Mild protein malnutrition (HCC) Protein supplementation.   Consider nutritional services evaluation.    Normocytic anemia Monitor H&H.   DVT prophylaxis: Lovenox SQ.  Code Status:   Full code. Family Communication:  His wife was at bedside. Disposition Plan:   Patient is from:  Home.  Anticipated DC to:  Home.  Anticipated DC date:  02/28/2021.  Anticipated DC barriers: Clinical status.  Consults called:  Believe. Admission status:  Observation/telemetry.   Severity of Illness: High severity after presenting with generalized weakness, fatigue, malaise and decreased oral intake in the setting of acute COVID-19 disease.  The patient will remain in the hospital for 24 to 48 hours for further treatment.  Reubin Milan MD [facility]  How to contact the [facility] Attending or Consulting provider [place] or covering provider during after  hours [place], for this patient?   Check the care team in [facility] and look for a) attending/consulting [facility] provider listed and b) the [facility] team listed Log into [email or web address] and use [place]'s universal password to access. If you do not have the password, please contact the hospital operator. Locate the [facility] provider you are looking for under [facility] and page to a number that you can be directly reached. If you still have difficulty reaching the provider, please page the [facility] (Director on Call) for the Hospitalists listed on amion for assistance.  02/26/2021, 1:36 PM   This document was created using Dragon voice recognition software and may contain some unintended transcription errors.

## 2021-02-26 NOTE — Progress Notes (Signed)
Flutter valve not given pt not able to do.

## 2021-02-26 NOTE — ED Notes (Signed)
Pt with incont episode of bowel and bladder. Also pt pulling at medical equipment and IV is pulled out. Attempted to restart PIV x2 unsuccessful. Will have another RN attempt. Pt cleaned up and condom cath and brief placed on pt. Mittens applied.

## 2021-02-26 NOTE — ED Triage Notes (Signed)
Pt to ER from home with c/o falls.  Pt fell yesterday, fire responded, help pt to chair at that time.  Pt remained in chair since that time and upon standing this AM fell again.  Pt has hx of dementia, reports by wife of steady decline in last 2 years, with significant changes in last several days.  Pt reports new incontinence and burning on urination.  Pt has been incontinent of stool today.

## 2021-02-27 DIAGNOSIS — U071 COVID-19: Secondary | ICD-10-CM | POA: Diagnosis present

## 2021-02-27 DIAGNOSIS — I480 Paroxysmal atrial fibrillation: Secondary | ICD-10-CM | POA: Diagnosis not present

## 2021-02-27 DIAGNOSIS — G20 Parkinson's disease: Secondary | ICD-10-CM | POA: Diagnosis present

## 2021-02-27 DIAGNOSIS — E876 Hypokalemia: Secondary | ICD-10-CM | POA: Diagnosis not present

## 2021-02-27 DIAGNOSIS — R7989 Other specified abnormal findings of blood chemistry: Secondary | ICD-10-CM | POA: Diagnosis not present

## 2021-02-27 DIAGNOSIS — Q446 Cystic disease of liver: Secondary | ICD-10-CM | POA: Diagnosis not present

## 2021-02-27 DIAGNOSIS — I959 Hypotension, unspecified: Secondary | ICD-10-CM | POA: Diagnosis not present

## 2021-02-27 DIAGNOSIS — E785 Hyperlipidemia, unspecified: Secondary | ICD-10-CM | POA: Diagnosis present

## 2021-02-27 DIAGNOSIS — E441 Mild protein-calorie malnutrition: Secondary | ICD-10-CM | POA: Diagnosis present

## 2021-02-27 DIAGNOSIS — R410 Disorientation, unspecified: Secondary | ICD-10-CM | POA: Diagnosis not present

## 2021-02-27 DIAGNOSIS — Z515 Encounter for palliative care: Secondary | ICD-10-CM | POA: Diagnosis not present

## 2021-02-27 DIAGNOSIS — J309 Allergic rhinitis, unspecified: Secondary | ICD-10-CM | POA: Diagnosis present

## 2021-02-27 DIAGNOSIS — R338 Other retention of urine: Secondary | ICD-10-CM | POA: Diagnosis not present

## 2021-02-27 DIAGNOSIS — E039 Hypothyroidism, unspecified: Secondary | ICD-10-CM | POA: Diagnosis present

## 2021-02-27 DIAGNOSIS — L89302 Pressure ulcer of unspecified buttock, stage 2: Secondary | ICD-10-CM | POA: Diagnosis not present

## 2021-02-27 DIAGNOSIS — K6389 Other specified diseases of intestine: Secondary | ICD-10-CM | POA: Diagnosis not present

## 2021-02-27 DIAGNOSIS — I4891 Unspecified atrial fibrillation: Secondary | ICD-10-CM | POA: Diagnosis present

## 2021-02-27 DIAGNOSIS — G9341 Metabolic encephalopathy: Secondary | ICD-10-CM | POA: Diagnosis present

## 2021-02-27 DIAGNOSIS — I10 Essential (primary) hypertension: Secondary | ICD-10-CM | POA: Diagnosis present

## 2021-02-27 DIAGNOSIS — I4510 Unspecified right bundle-branch block: Secondary | ICD-10-CM | POA: Diagnosis present

## 2021-02-27 DIAGNOSIS — R319 Hematuria, unspecified: Secondary | ICD-10-CM | POA: Diagnosis not present

## 2021-02-27 DIAGNOSIS — I700 Atherosclerosis of aorta: Secondary | ICD-10-CM | POA: Diagnosis not present

## 2021-02-27 DIAGNOSIS — R5381 Other malaise: Secondary | ICD-10-CM | POA: Diagnosis not present

## 2021-02-27 DIAGNOSIS — E0590 Thyrotoxicosis, unspecified without thyrotoxic crisis or storm: Secondary | ICD-10-CM | POA: Diagnosis present

## 2021-02-27 DIAGNOSIS — T45515A Adverse effect of anticoagulants, initial encounter: Secondary | ICD-10-CM | POA: Diagnosis not present

## 2021-02-27 DIAGNOSIS — F02A0 Dementia in other diseases classified elsewhere, mild, without behavioral disturbance, psychotic disturbance, mood disturbance, and anxiety: Secondary | ICD-10-CM | POA: Diagnosis present

## 2021-02-27 DIAGNOSIS — E119 Type 2 diabetes mellitus without complications: Secondary | ICD-10-CM | POA: Diagnosis present

## 2021-02-27 DIAGNOSIS — Z209 Contact with and (suspected) exposure to unspecified communicable disease: Secondary | ICD-10-CM | POA: Diagnosis not present

## 2021-02-27 DIAGNOSIS — H9191 Unspecified hearing loss, right ear: Secondary | ICD-10-CM | POA: Diagnosis present

## 2021-02-27 DIAGNOSIS — G3184 Mild cognitive impairment, so stated: Secondary | ICD-10-CM | POA: Diagnosis not present

## 2021-02-27 DIAGNOSIS — E042 Nontoxic multinodular goiter: Secondary | ICD-10-CM | POA: Diagnosis not present

## 2021-02-27 DIAGNOSIS — N179 Acute kidney failure, unspecified: Secondary | ICD-10-CM | POA: Diagnosis not present

## 2021-02-27 DIAGNOSIS — R609 Edema, unspecified: Secondary | ICD-10-CM | POA: Diagnosis not present

## 2021-02-27 DIAGNOSIS — L89892 Pressure ulcer of other site, stage 2: Secondary | ICD-10-CM | POA: Diagnosis present

## 2021-02-27 DIAGNOSIS — Z7401 Bed confinement status: Secondary | ICD-10-CM | POA: Diagnosis not present

## 2021-02-27 DIAGNOSIS — R4182 Altered mental status, unspecified: Secondary | ICD-10-CM | POA: Diagnosis not present

## 2021-02-27 DIAGNOSIS — L538 Other specified erythematous conditions: Secondary | ICD-10-CM | POA: Diagnosis not present

## 2021-02-27 DIAGNOSIS — G3183 Dementia with Lewy bodies: Secondary | ICD-10-CM | POA: Diagnosis present

## 2021-02-27 DIAGNOSIS — R7401 Elevation of levels of liver transaminase levels: Secondary | ICD-10-CM | POA: Diagnosis present

## 2021-02-27 DIAGNOSIS — R531 Weakness: Secondary | ICD-10-CM | POA: Diagnosis present

## 2021-02-27 DIAGNOSIS — D649 Anemia, unspecified: Secondary | ICD-10-CM | POA: Diagnosis present

## 2021-02-27 DIAGNOSIS — K3189 Other diseases of stomach and duodenum: Secondary | ICD-10-CM | POA: Diagnosis not present

## 2021-02-27 DIAGNOSIS — I82612 Acute embolism and thrombosis of superficial veins of left upper extremity: Secondary | ICD-10-CM | POA: Diagnosis not present

## 2021-02-27 LAB — CBC WITH DIFFERENTIAL/PLATELET
Abs Immature Granulocytes: 0.02 10*3/uL (ref 0.00–0.07)
Basophils Absolute: 0 10*3/uL (ref 0.0–0.1)
Basophils Relative: 0 %
Eosinophils Absolute: 0 10*3/uL (ref 0.0–0.5)
Eosinophils Relative: 0 %
HCT: 41.5 % (ref 39.0–52.0)
Hemoglobin: 14 g/dL (ref 13.0–17.0)
Immature Granulocytes: 0 %
Lymphocytes Relative: 8 %
Lymphs Abs: 0.6 10*3/uL — ABNORMAL LOW (ref 0.7–4.0)
MCH: 30.8 pg (ref 26.0–34.0)
MCHC: 33.7 g/dL (ref 30.0–36.0)
MCV: 91.2 fL (ref 80.0–100.0)
Monocytes Absolute: 0.9 10*3/uL (ref 0.1–1.0)
Monocytes Relative: 13 %
Neutro Abs: 5.5 10*3/uL (ref 1.7–7.7)
Neutrophils Relative %: 79 %
Platelets: 144 10*3/uL — ABNORMAL LOW (ref 150–400)
RBC: 4.55 MIL/uL (ref 4.22–5.81)
RDW: 13.2 % (ref 11.5–15.5)
WBC: 7.1 10*3/uL (ref 4.0–10.5)
nRBC: 0 % (ref 0.0–0.2)

## 2021-02-27 LAB — COMPREHENSIVE METABOLIC PANEL
ALT: 47 U/L — ABNORMAL HIGH (ref 0–44)
AST: 123 U/L — ABNORMAL HIGH (ref 15–41)
Albumin: 3.4 g/dL — ABNORMAL LOW (ref 3.5–5.0)
Alkaline Phosphatase: 54 U/L (ref 38–126)
Anion gap: 8 (ref 5–15)
BUN: 19 mg/dL (ref 8–23)
CO2: 22 mmol/L (ref 22–32)
Calcium: 8.5 mg/dL — ABNORMAL LOW (ref 8.9–10.3)
Chloride: 105 mmol/L (ref 98–111)
Creatinine, Ser: 1.03 mg/dL (ref 0.61–1.24)
GFR, Estimated: >60 mL/min (ref >60)
Glucose, Bld: 95 mg/dL (ref 70–99)
Potassium: 3.8 mmol/L (ref 3.5–5.1)
Sodium: 135 mmol/L (ref 135–145)
Total Bilirubin: 2.1 mg/dL — ABNORMAL HIGH (ref 0.3–1.2)
Total Protein: 6 g/dL — ABNORMAL LOW (ref 6.5–8.1)

## 2021-02-27 LAB — MAGNESIUM: Magnesium: 2.3 mg/dL (ref 1.7–2.4)

## 2021-02-27 LAB — GLUCOSE, CAPILLARY: Glucose-Capillary: 171 mg/dL — ABNORMAL HIGH (ref 70–99)

## 2021-02-27 LAB — HEMOGLOBIN A1C
Hgb A1c MFr Bld: 5.7 % — ABNORMAL HIGH (ref 4.8–5.6)
Mean Plasma Glucose: 116.89 mg/dL

## 2021-02-27 LAB — C-REACTIVE PROTEIN: CRP: 6.3 mg/dL — ABNORMAL HIGH (ref <1.0)

## 2021-02-27 LAB — D-DIMER, QUANTITATIVE: D-Dimer, Quant: 0.71 ug/mL-FEU — ABNORMAL HIGH (ref 0.00–0.50)

## 2021-02-27 LAB — PHOSPHORUS: Phosphorus: 3.6 mg/dL (ref 2.5–4.6)

## 2021-02-27 LAB — FERRITIN: Ferritin: 358 ng/mL — ABNORMAL HIGH (ref 24–336)

## 2021-02-27 LAB — CBG MONITORING, ED
Glucose-Capillary: 110 mg/dL — ABNORMAL HIGH (ref 70–99)
Glucose-Capillary: 97 mg/dL (ref 70–99)

## 2021-02-27 MED ORDER — HALOPERIDOL LACTATE 5 MG/ML IJ SOLN
2.0000 mg | Freq: Four times a day (QID) | INTRAMUSCULAR | Status: DC | PRN
  Administered 2021-02-27 – 2021-03-04 (×3): 2 mg via INTRAVENOUS
  Filled 2021-02-27 (×3): qty 1

## 2021-02-27 MED ORDER — MELATONIN 3 MG PO TABS
3.0000 mg | ORAL_TABLET | Freq: Every day | ORAL | Status: DC
  Administered 2021-02-27 – 2021-03-12 (×13): 3 mg via ORAL
  Filled 2021-02-27 (×14): qty 1

## 2021-02-27 MED ORDER — QUETIAPINE FUMARATE 25 MG PO TABS: 12.5000 mg | ORAL_TABLET | Freq: Two times a day (BID) | ORAL | Status: DC

## 2021-02-27 MED ORDER — VITAMIN D 25 MCG (1000 UNIT) PO TABS
1000.0000 [IU] | ORAL_TABLET | Freq: Every day | ORAL | Status: DC
  Administered 2021-02-28 – 2021-03-13 (×13): 1000 [IU] via ORAL
  Filled 2021-02-27 (×13): qty 1

## 2021-02-27 NOTE — Progress Notes (Signed)
PROGRESS NOTE  Erik Lara GMW:[id number] DOB: Mar 16, 1935 DOA: 02/26/2021 PCP: Dorothyann Peng, NP  HPI/Recap of past 24 hours: Erik Lara is a 85 y.o. male with medical history significant of symptomatic gallstones, right hearing loss, hepatic cyst, hyperlipidemia, hypothyroidism, mild neurocognitive disorder, prostatitis, type II DM who presented to [facility] ED from home due to a fall, lives with his wife.  Associated with worsening generalized weakness and confusion.  He fell the night prior to his presentation.  Workup revealed Covid-19 viral infection.  CT head/cervical spine were non acute.  UA was equivocal.  Started on antiviral Paxlovid.   02/27/21:  Seen and examined at bedside in the ED.  Somnolent and confused.  Mittens in place due to agitation last night.   Assessment/Plan: Principal Problem:   2019 novel coronavirus disease (COVID-19) Active Problems:   Hyperlipidemia   Allergic rhinitis   Hyperthyroidism   Type 2 diabetes mellitus without complication, without long-term current use of insulin (HCC)   Mild neurocognitive disorder   Hypocalcemia   Mild protein malnutrition (HCC)   Normocytic anemia  COVID-19 viral infection Chest x-ray nonacute Currently on room air with O2 sats was 97%. Continue antiviral Continue vitamin C, zinc, and add vitamin D3. Maintain O2 saturation greater than 92% Trend inflammatory markers Incentive spirometer, flutter valve  Acute metabolic encephalopathy/delirium agitation in the setting of COVID-19 viral infection. CT head nonacute Nonpharmacological and pharmacological delirium management Reorient as needed Fall precautions   Acute transaminitis, likely related to COVID-19 LFTs are uptrending Closely monitor Repeat CMP in the morning   Type 2 diabetes mellitus without complication,  without long-term current use of insulin (HCC) Carbohydrate modified diet. Check hemoglobin A1c. CBG monitoring before meals and  bedtime. Add RI SS if needed.     Hyperlipidemia Continue simvastatin.     Allergic rhinitis Continue cetirizine or formulary equivalent.     Hyperthyroidism No signs or symptoms of active disease.     Mild neurocognitive disorder Continue donezepil and Namenda. Supportive care.   Pseudohypoglycemia Corrected calcium for albumin 10.0.      Mild protein malnutrition (HCC) Protein supplementation.   Consider nutritional services evaluation.     Normocytic anemia Monitor H&H.  Physical debility PT OT to assess when no longer agitated Fall precaution     DVT prophylaxis:      Lovenox SQ daily. Code Status:              Full code. Family Communication:      None at bedside at the time of the visit. Disposition Plan:              Patient is from:                        Home.             Anticipated DC to:                   Home.             Anticipated DC date:               02/28/2021.             Anticipated DC barriers:         Clinical status.           Consults called:    None. Admission status:       Status is: Observation  Objective: Vitals:   02/27/21 0530 02/27/21 0600 02/27/21 0839 02/27/21 1030  BP: 118/81 (!) 141/66 (!) 155/126 126/80  Pulse: 86 76 74 (!) 59  Resp: 18 19 14 17   Temp:   98.3 F (36.8 C)   TempSrc:   Axillary   SpO2: 98% 96% 98% 93%  Weight:      Height:       No intake or output data in the 24 hours ending 02/27/21 1052 Filed Weights   02/26/21 1034 02/26/21 1214  Weight: 82 kg 81.6 kg    Exam:  General: 85 y.o. year-old male well developed well nourished in no acute distress.  Alert and confused. Cardiovascular: Regular rate and rhythm with no rubs or gallops.  No thyromegaly or JVD noted.   Respiratory: Clear to auscultation with no wheezes or rales.  Poor inspiratory effort. Abdomen: Soft nontender nondistended with normal bowel sounds x4 quadrants. Musculoskeletal: No lower extremity edema. 2/4 pulses in all 4  extremities. Skin: No ulcerative lesions noted or rashes, Psychiatry: Mood is appropriate for condition and setting   Data Reviewed: CBC: Recent Labs  Lab 02/26/21 1136 02/27/21 0545  WBC 5.7 7.1  NEUTROABS 4.3 5.5  HGB 12.9* 14.0  HCT 40.1 41.5  MCV 95.5 91.2  PLT 138* 211*   Basic Metabolic Panel: Recent Labs  Lab 02/26/21 1136 02/26/21 1332 02/27/21 0545  NA 135  --  135  K 3.5  --  3.8  CL 105  --  105  CO2 20*  --  22  GLUCOSE 81  --  95  BUN 18  --  19  CREATININE 0.66  --  1.03  CALCIUM 8.4*  --  8.5*  MG  --  2.1 2.3  PHOS  --  3.3 3.6   GFR: Estimated Creatinine Clearance: 61.4 mL/min (by C-G formula based on SCr of 1.03 mg/dL). Liver Function Tests: Recent Labs  Lab 02/26/21 1136 02/27/21 0545  AST 85* 123*  ALT 33 47*  ALKPHOS 56 54  BILITOT 2.1* 2.1*  PROT 6.3* 6.0*  ALBUMIN 3.6 3.4*   No results for input(s): LIPASE, AMYLASE in the last 168 hours. No results for input(s): AMMONIA in the last 168 hours. Coagulation Profile: No results for input(s): INR, PROTIME in the last 168 hours. Cardiac Enzymes: No results for input(s): CKTOTAL, CKMB, CKMBINDEX, TROPONINI in the last 168 hours. BNP (last 3 results) No results for input(s): PROBNP in the last 8760 hours. HbA1C: Recent Labs    02/27/21 0545  HGBA1C 5.7*   CBG: Recent Labs  Lab 02/26/21 1135 02/26/21 2213 02/26/21 2307 02/27/21 0834  GLUCAP 90 64* 123* 110*   Lipid Profile: No results for input(s): CHOL, HDL, LDLCALC, TRIG, CHOLHDL, LDLDIRECT in the last 72 hours. Thyroid Function Tests: No results for input(s): TSH, T4TOTAL, FREET4, T3FREE, THYROIDAB in the last 72 hours. Anemia Panel: Recent Labs    02/26/21 1332  FERRITIN 289   Urine analysis:    Component Value Date/Time   COLORURINE YELLOW 02/26/2021 [place] 02/26/2021 1532   LABSPEC 1.030 02/26/2021 1532   PHURINE 6.0 02/26/2021 1532   GLUCOSEU NEGATIVE 02/26/2021 1532   HGBUR SMALL (A)  02/26/2021 1532   HGBUR negative 03/29/2010 0812   BILIRUBINUR NEGATIVE 02/26/2021 1532   BILIRUBINUR n 03/28/2016 1137   KETONESUR 20 (A) 02/26/2021 1532   PROTEINUR NEGATIVE 02/26/2021 1532   UROBILINOGEN 0.2 03/28/2016 1137   UROBILINOGEN 0.2 03/29/2010 0812   NITRITE NEGATIVE 02/26/2021 1532  LEUKOCYTESUR NEGATIVE 02/26/2021 1532   Sepsis Labs: @LABRCNTIP (procalcitonin:4,lacticidven:4)  ) Recent Results (from the past 240 hour(s))  Resp Panel by RT-PCR (Flu A&B, Covid) Nasopharyngeal Swab     Status: Abnormal   Collection Time: 02/26/21 11:36 AM   Specimen: Nasopharyngeal Swab; Nasopharyngeal(NP) swabs in vial transport medium  Result Value Ref Range Status   SARS Coronavirus 2 by RT PCR POSITIVE (A) NEGATIVE Final    Comment: (NOTE) SARS-CoV-2 target nucleic acids are DETECTED.  The SARS-CoV-2 RNA is generally detectable in upper respiratory specimens during the acute phase of infection. Positive results are indicative of the presence of the identified virus, but do not rule out bacterial infection or co-infection with other pathogens not detected by the test. Clinical correlation with patient history and other diagnostic information is necessary to determine patient infection status. The expected result is Negative.  Fact Sheet for Patients: EntrepreneurPulse.com.au  Fact Sheet for Healthcare Providers: IncredibleEmployment.be  This test is not yet approved or cleared by the [place] FDA and  has been authorized for detection and/or diagnosis of SARS-CoV-2 by FDA under an Emergency Use Authorization (EUA).  This EUA will remain in effect (meaning this test can be used) for the duration of  the COVID-19 declaration under Section 564(b)(1) of the A ct, 21 U.S.C. section 360bbb-3(b)(1), unless the authorization is terminated or revoked sooner.     Influenza A by PCR NEGATIVE NEGATIVE Final   Influenza B by PCR NEGATIVE  NEGATIVE Final    Comment: (NOTE) The Xpert Xpress SARS-CoV-2/FLU/RSV plus assay is intended as an aid in the diagnosis of influenza from Nasopharyngeal swab specimens and should not be used as a sole basis for treatment. Nasal washings and aspirates are unacceptable for Xpert Xpress SARS-CoV-2/FLU/RSV testing.  Fact Sheet for Patients: EntrepreneurPulse.com.au  Fact Sheet for Healthcare Providers: IncredibleEmployment.be  This test is not yet approved or cleared by the [place] FDA and has been authorized for detection and/or diagnosis of SARS-CoV-2 by FDA under an Emergency Use Authorization (EUA). This EUA will remain in effect (meaning this test can be used) for the duration of the COVID-19 declaration under Section 564(b)(1) of the Act, 21 U.S.C. section 360bbb-3(b)(1), unless the authorization is terminated or revoked.  Performed at [facility], [place] [address] [place], [place] [address]       Studies: DG Chest 1 View  Result Date: 02/26/2021 CLINICAL DATA:  85 year old male with history of fall and weakness. EXAM: CHEST  1 VIEW COMPARISON:  No priors. FINDINGS: Skin fold artifact projecting over the lateral left hemithorax. Eventration of the right hemidiaphragm. Lung volumes are normal. No consolidative airspace disease. No pleural effusions. No pneumothorax. No pulmonary nodule or mass noted. Pulmonary vasculature and the cardiomediastinal silhouette are within normal limits. Atherosclerosis in the thoracic aorta. IMPRESSION: 1.  No radiographic evidence of acute cardiopulmonary disease. 2. Aortic atherosclerosis. Electronically Signed   By: Vinnie Langton M.D.   On: 02/26/2021 11:26   CT Head Wo Contrast  Result Date: 02/26/2021 CLINICAL DATA:  Mental status change. Recent falls. New incontinence. EXAM: CT HEAD WITHOUT CONTRAST CT CERVICAL SPINE WITHOUT CONTRAST TECHNIQUE: Multidetector CT imaging of the head  and cervical spine was performed following the standard protocol without intravenous contrast. Multiplanar CT image reconstructions of the cervical spine were also generated. COMPARISON:  Head MRI 09/13/2019 and. CT head and cervical spine 09/18/2017. FINDINGS: CT HEAD FINDINGS Brain: There is no evidence of an acute infarct, intracranial hemorrhage, mass, midline shift, or extra-axial  fluid collection. Generalized cerebral atrophy is mild for age. Vascular: Calcified atherosclerosis at the skull base. No hyperdense vessel. Skull: No fracture or suspicious osseous lesion. Sinuses/Orbits: Paranasal sinuses and mastoid air cells are clear. Unremarkable orbits. Other: None. CT CERVICAL SPINE FINDINGS Alignment: Straightening of the normal cervical lordosis. No listhesis. Skull base and vertebrae: No acute fracture. Scattered small sclerotic foci in the cervical and included upper thoracic spine, chronic and largely unchanged although a focus in the left C7 transverse process has mildly enlarged. Given the stability of most of these foci for 2+ years, a benign etiology such as multiple bone islands is strongly favored over metastatic disease. Soft tissues and spinal canal: No prevertebral fluid or swelling. No visible canal hematoma. Disc levels: Moderate cervical disc and facet degeneration without evidence of high-grade spinal canal stenosis. Uncovertebral and facet spurring results in multilevel neural foraminal stenosis, severe on the right at C3-4. Upper chest: Clear lung apices. Other: None. IMPRESSION: 1. No evidence of acute intracranial abnormality. 2. No evidence of acute fracture or subluxation in the cervical spine. Electronically Signed   By: Logan Bores M.D.   On: 02/26/2021 11:37   CT Cervical Spine Wo Contrast  Result Date: 02/26/2021 CLINICAL DATA:  Mental status change. Recent falls. New incontinence. EXAM: CT HEAD WITHOUT CONTRAST CT CERVICAL SPINE WITHOUT CONTRAST TECHNIQUE: Multidetector CT  imaging of the head and cervical spine was performed following the standard protocol without intravenous contrast. Multiplanar CT image reconstructions of the cervical spine were also generated. COMPARISON:  Head MRI 09/13/2019 and. CT head and cervical spine 09/18/2017. FINDINGS: CT HEAD FINDINGS Brain: There is no evidence of an acute infarct, intracranial hemorrhage, mass, midline shift, or extra-axial fluid collection. Generalized cerebral atrophy is mild for age. Vascular: Calcified atherosclerosis at the skull base. No hyperdense vessel. Skull: No fracture or suspicious osseous lesion. Sinuses/Orbits: Paranasal sinuses and mastoid air cells are clear. Unremarkable orbits. Other: None. CT CERVICAL SPINE FINDINGS Alignment: Straightening of the normal cervical lordosis. No listhesis. Skull base and vertebrae: No acute fracture. Scattered small sclerotic foci in the cervical and included upper thoracic spine, chronic and largely unchanged although a focus in the left C7 transverse process has mildly enlarged. Given the stability of most of these foci for 2+ years, a benign etiology such as multiple bone islands is strongly favored over metastatic disease. Soft tissues and spinal canal: No prevertebral fluid or swelling. No visible canal hematoma. Disc levels: Moderate cervical disc and facet degeneration without evidence of high-grade spinal canal stenosis. Uncovertebral and facet spurring results in multilevel neural foraminal stenosis, severe on the right at C3-4. Upper chest: Clear lung apices. Other: None. IMPRESSION: 1. No evidence of acute intracranial abnormality. 2. No evidence of acute fracture or subluxation in the cervical spine. Electronically Signed   By: Logan Bores M.D.   On: 02/26/2021 11:37    Scheduled Meds:  vitamin C  500 mg Oral Daily   donepezil  10 mg Oral QHS   enoxaparin (LOVENOX) injection  40 mg Subcutaneous Q24H   memantine  10 mg Oral BID   nirmatrelvir/ritonavir EUA  3 tablet  Oral BID   zinc sulfate  220 mg Oral Daily    Continuous Infusions:   LOS: 0 days     Kayleen Memos, MD [facility] Pager [phone]  If 7PM-7AM, please contact night-coverage [email or web address] Password [email or web address] 02/27/2021, 10:52 AM

## 2021-02-27 NOTE — ED Notes (Signed)
Pt's son, [name], calling for update on pt condition at this time. Son states that he and his wife are living in [place]. and apart from the pt's wife, are the only family members that are available as a support to the pt. There is an estranged son that is local and that sons exwife that might be able to help some, however apart from the pt's spouse, [name] is the only family support available. This son also states that pt's spouse is not communicating well with pt's son about the pt's condition. He attributes this to the pt's spouse being [facility] and also that she "might not fully grasp what is going on".

## 2021-02-28 ENCOUNTER — Inpatient Hospital Stay (HOSPITAL_COMMUNITY): Payer: PPO

## 2021-02-28 DIAGNOSIS — I4891 Unspecified atrial fibrillation: Secondary | ICD-10-CM

## 2021-02-28 DIAGNOSIS — I700 Atherosclerosis of aorta: Secondary | ICD-10-CM | POA: Diagnosis not present

## 2021-02-28 DIAGNOSIS — R4182 Altered mental status, unspecified: Secondary | ICD-10-CM | POA: Diagnosis not present

## 2021-02-28 DIAGNOSIS — U071 COVID-19: Secondary | ICD-10-CM | POA: Diagnosis not present

## 2021-02-28 LAB — COMPREHENSIVE METABOLIC PANEL
ALT: 52 U/L — ABNORMAL HIGH (ref 0–44)
AST: 105 U/L — ABNORMAL HIGH (ref 15–41)
Albumin: 3.4 g/dL — ABNORMAL LOW (ref 3.5–5.0)
Alkaline Phosphatase: 53 U/L (ref 38–126)
Anion gap: 6 (ref 5–15)
BUN: 20 mg/dL (ref 8–23)
CO2: 27 mmol/L (ref 22–32)
Calcium: 9 mg/dL (ref 8.9–10.3)
Chloride: 105 mmol/L (ref 98–111)
Creatinine, Ser: 0.89 mg/dL (ref 0.61–1.24)
GFR, Estimated: >60 mL/min (ref >60)
Glucose, Bld: 115 mg/dL — ABNORMAL HIGH (ref 70–99)
Potassium: 3.4 mmol/L — ABNORMAL LOW (ref 3.5–5.1)
Sodium: 138 mmol/L (ref 135–145)
Total Bilirubin: 1.7 mg/dL — ABNORMAL HIGH (ref 0.3–1.2)
Total Protein: 6.1 g/dL — ABNORMAL LOW (ref 6.5–8.1)

## 2021-02-28 LAB — GLUCOSE, CAPILLARY
Glucose-Capillary: 126 mg/dL — ABNORMAL HIGH (ref 70–99)
Glucose-Capillary: 175 mg/dL — ABNORMAL HIGH (ref 70–99)
Glucose-Capillary: 89 mg/dL (ref 70–99)
Glucose-Capillary: 101 mg/dL — ABNORMAL HIGH (ref 70–99)

## 2021-02-28 LAB — CBC WITH DIFFERENTIAL/PLATELET
Abs Immature Granulocytes: 0.04 10*3/uL (ref 0.00–0.07)
Basophils Absolute: 0 10*3/uL (ref 0.0–0.1)
Basophils Relative: 0 %
Eosinophils Absolute: 0 10*3/uL (ref 0.0–0.5)
Eosinophils Relative: 0 %
HCT: 41.7 % (ref 39.0–52.0)
Hemoglobin: 13.8 g/dL (ref 13.0–17.0)
Immature Granulocytes: 1 %
Lymphocytes Relative: 9 %
Lymphs Abs: 0.7 10*3/uL (ref 0.7–4.0)
MCH: 30.1 pg (ref 26.0–34.0)
MCHC: 33.1 g/dL (ref 30.0–36.0)
MCV: 90.8 fL (ref 80.0–100.0)
Monocytes Absolute: 0.6 10*3/uL (ref 0.1–1.0)
Monocytes Relative: 8 %
Neutro Abs: 6.7 10*3/uL (ref 1.7–7.7)
Neutrophils Relative %: 82 %
Platelets: 144 10*3/uL — ABNORMAL LOW (ref 150–400)
RBC: 4.59 MIL/uL (ref 4.22–5.81)
RDW: 13.2 % (ref 11.5–15.5)
WBC: 8.1 10*3/uL (ref 4.0–10.5)
nRBC: 0 % (ref 0.0–0.2)

## 2021-02-28 LAB — BASIC METABOLIC PANEL
Anion gap: 8 (ref 5–15)
BUN: 22 mg/dL (ref 8–23)
CO2: 20 mmol/L — ABNORMAL LOW (ref 22–32)
Calcium: 8.7 mg/dL — ABNORMAL LOW (ref 8.9–10.3)
Chloride: 107 mmol/L (ref 98–111)
Creatinine, Ser: 0.89 mg/dL (ref 0.61–1.24)
GFR, Estimated: >60 mL/min (ref >60)
Glucose, Bld: 210 mg/dL — ABNORMAL HIGH (ref 70–99)
Potassium: 3.4 mmol/L — ABNORMAL LOW (ref 3.5–5.1)
Sodium: 135 mmol/L (ref 135–145)

## 2021-02-28 LAB — TSH: TSH: 0.143 u[IU]/mL — ABNORMAL LOW (ref 0.350–4.500)

## 2021-02-28 LAB — FERRITIN: Ferritin: 370 ng/mL — ABNORMAL HIGH (ref 24–336)

## 2021-02-28 LAB — D-DIMER, QUANTITATIVE: D-Dimer, Quant: 1.06 ug/mL-FEU — ABNORMAL HIGH (ref 0.00–0.50)

## 2021-02-28 LAB — HEPARIN LEVEL (UNFRACTIONATED): Heparin Unfractionated: 0.49 IU/mL (ref 0.30–0.70)

## 2021-02-28 LAB — MAGNESIUM: Magnesium: 2.2 mg/dL (ref 1.7–2.4)

## 2021-02-28 LAB — ECHOCARDIOGRAM LIMITED
Height: 73 in
Weight: 2880 oz

## 2021-02-28 LAB — MRSA NEXT GEN BY PCR, NASAL: MRSA by PCR Next Gen: NOT DETECTED

## 2021-02-28 LAB — PHOSPHORUS: Phosphorus: 3.6 mg/dL (ref 2.5–4.6)

## 2021-02-28 LAB — C-REACTIVE PROTEIN: CRP: 7.6 mg/dL — ABNORMAL HIGH (ref <1.0)

## 2021-02-28 MED ORDER — TRAZODONE HCL 50 MG PO TABS
50.0000 mg | ORAL_TABLET | Freq: Every day | ORAL | Status: DC
  Administered 2021-02-28 – 2021-03-12 (×13): 50 mg via ORAL
  Filled 2021-02-28 (×13): qty 1

## 2021-02-28 MED ORDER — QUETIAPINE FUMARATE 25 MG PO TABS: 12.5000 mg | ORAL_TABLET | Freq: Two times a day (BID) | ORAL | Status: DC

## 2021-02-28 MED ORDER — AMIODARONE HCL IN DEXTROSE 360-4.14 MG/200ML-% IV SOLN
60.0000 mg/h | INTRAVENOUS | Status: AC
  Administered 2021-02-28: 14:00:00 60 mg/h via INTRAVENOUS
  Filled 2021-02-28: qty 200

## 2021-02-28 MED ORDER — CHLORHEXIDINE GLUCONATE CLOTH 2 % EX PADS
6.0000 | MEDICATED_PAD | Freq: Every day | CUTANEOUS | Status: DC
  Administered 2021-02-28 – 2021-03-13 (×13): 6 via TOPICAL

## 2021-02-28 MED ORDER — HEPARIN BOLUS VIA INFUSION
4000.0000 [IU] | Freq: Once | INTRAVENOUS | Status: AC
Start: 2021-02-28 — End: 2021-02-28
  Administered 2021-02-28: 17:00:00 4000 [IU] via INTRAVENOUS
  Filled 2021-02-28: qty 4000

## 2021-02-28 MED ORDER — ORAL CARE MOUTH RINSE
15.0000 mL | Freq: Two times a day (BID) | OROMUCOSAL | Status: DC
  Administered 2021-02-28 – 2021-03-13 (×21): 15 mL via OROMUCOSAL

## 2021-02-28 MED ORDER — SODIUM CHLORIDE 0.9 % IV BOLUS
1000.0000 mL | INTRAVENOUS | Status: AC
  Administered 2021-02-28: 14:00:00 1000 mL via INTRAVENOUS

## 2021-02-28 MED ORDER — METOPROLOL TARTRATE 5 MG/5ML IV SOLN
2.5000 mg | Freq: Four times a day (QID) | INTRAVENOUS | Status: DC | PRN
  Administered 2021-03-03: 03:00:00 2.5 mg via INTRAVENOUS
  Filled 2021-02-28 (×2): qty 5

## 2021-02-28 MED ORDER — LACTATED RINGERS IV SOLN: INTRAVENOUS | Status: AC

## 2021-02-28 MED ORDER — HALOPERIDOL 0.5 MG PO TABS
0.5000 mg | ORAL_TABLET | Freq: Two times a day (BID) | ORAL | Status: DC
  Administered 2021-03-01 – 2021-03-09 (×16): 0.5 mg via ORAL
  Filled 2021-02-28 (×19): qty 1

## 2021-02-28 MED ORDER — METOPROLOL TARTRATE 12.5 MG HALF TABLET
12.5000 mg | ORAL_TABLET | Freq: Two times a day (BID) | ORAL | Status: DC
  Administered 2021-02-28: 13:00:00 12.5 mg via ORAL
  Filled 2021-02-28 (×4): qty 1

## 2021-02-28 MED ORDER — SODIUM CHLORIDE 0.9 % IV SOLN: INTRAVENOUS | Status: DC | PRN

## 2021-02-28 MED ORDER — AMPICILLIN-SULBACTAM SODIUM 3 (2-1) G IJ SOLR
3.0000 g | Freq: Four times a day (QID) | INTRAMUSCULAR | Status: DC
Start: 2021-02-28 — End: 2021-03-01
  Administered 2021-02-28 – 2021-03-01 (×5): 3 g via INTRAVENOUS
  Filled 2021-02-28 (×7): qty 8

## 2021-02-28 MED ORDER — HEPARIN (PORCINE) 25000 UT/250ML-% IV SOLN
1450.0000 [IU]/h | INTRAVENOUS | Status: DC
  Administered 2021-02-28 – 2021-03-01 (×2): 1200 [IU]/h via INTRAVENOUS
  Administered 2021-03-02 – 2021-03-03 (×2): 1350 [IU]/h via INTRAVENOUS
  Filled 2021-02-28 (×4): qty 250

## 2021-02-28 MED ORDER — CHLORHEXIDINE GLUCONATE 0.12 % MT SOLN
15.0000 mL | Freq: Two times a day (BID) | OROMUCOSAL | Status: DC
  Administered 2021-02-28 – 2021-03-13 (×25): 15 mL via OROMUCOSAL
  Filled 2021-02-28 (×23): qty 15

## 2021-02-28 MED ORDER — POTASSIUM CHLORIDE 10 MEQ/100ML IV SOLN
10.0000 meq | INTRAVENOUS | Status: AC
  Administered 2021-02-28 (×3): 10 meq via INTRAVENOUS
  Filled 2021-02-28 (×3): qty 100

## 2021-02-28 MED ORDER — AMIODARONE HCL IN DEXTROSE 360-4.14 MG/200ML-% IV SOLN
30.0000 mg/h | INTRAVENOUS | Status: DC
  Administered 2021-02-28 – 2021-03-03 (×6): 30 mg/h via INTRAVENOUS
  Filled 2021-02-28 (×6): qty 200

## 2021-02-28 NOTE — Progress Notes (Addendum)
PROGRESS NOTE  Erik Lara YYT:[id number] DOB: September 01, 1935 DOA: 02/26/2021 PCP: Dorothyann Peng, NP  HPI/Recap of past 24 hours: Erik Lara is a 85 y.o. male with medical history significant of newly diagnosed liver body dementia, Parkinson disease, hearing impairment, hepatic cyst, hyperlipidemia, hypothyroidism, mild neurocognitive disorder, prostatitis, type II DM who presented to [facility] ED from home where he lives with his wife due to a fall, generalized weakness, and confusion x1 day.  Workup revealed Covid-19 viral infection.  CT head/cervical spine were non acute.  UA was equivocal.  He was started on antiviral Paxlovid.  Not hypoxic and not requiring oxygen supplementation.  Hospital course complicated by agitation delirium and new onset A. fib with RVR requiring amiodarone drip and heparin drip for CHA2DS2-VASc of 3.   02/28/21:  Seen and examined at his bedside.  Restless and mumbles.  Had a lot of secretions this morning with concern for possible aspiration.  Speech therapist consulted, started on Unasyn empirically for presumptive superimposed aspiration pneumonia.  We will follow his sputum culture if negative will DC antibiotics.  Assessment/Plan: Principal Problem:   2019 novel coronavirus disease (COVID-19) Active Problems:   Hyperlipidemia   Allergic rhinitis   Hyperthyroidism   Type 2 diabetes mellitus without complication, without long-term current use of insulin (HCC)   Mild neurocognitive disorder   Hypocalcemia   Mild protein malnutrition (HCC)   Normocytic anemia   COVID-19 virus infection  COVID-19 viral infection with concern for superimposed aspiration pneumonia. Started Unasyn on 02/28/2021 Follow sputum culture if negative DC antibiotics Continue antiviral, on Paxlovid Continue vitamin C, zinc, and add vitamin D3. Maintain O2 saturation greater than 92% Trend inflammatory markers Incentive spirometer, flutter valve  Acute metabolic  encephalopathy/delirium agitation in the setting of COVID-19 viral infection. CT head nonacute Nonpharmacological and pharmacological delirium management in place. P.o. Haldol 0.5 mg twice daily Trazodone nightly Regular sleep and wake cycle Continue to reorient as needed Continue fall precautions   New onset A. fib with RVR Started amiodarone drip Started heparin drip for CHA2DS2-VASc of 3 Follow 2D echo Cardiology consult  Dysphagia with concern for aspiration Speech therapist consulted to evaluate N.p.o. except for sips and meds with close monitoring and aspiration precautions  Acute transaminitis, likely related to COVID-19 Continue to trend LFTs Closely monitor Repeat CMP in the morning  Hypokalemia Serum potassium 3.4 Repleted intravenously Repeat BMP in the morning. Magnesium 2.2  History of Lewy body dementia/Parkinson disease Recently diagnosed with Lewy body dementia and follows up with neurology outpatient Continue home regimen Continue fall and aspiration precautions Haldol p.o. 0.5 mg twice daily   Prediabetes Carbohydrate modified diet. Hemoglobin A1c 5.7 on 02/27/2021. CBG monitoring before meals and bedtime. Avoid overcorrection, avoid hypoglycemia.     Hyperlipidemia Continue simvastatin.     Allergic rhinitis Continue cetirizine or formulary equivalent.     Hyperthyroidism No signs or symptoms of active disease.     Mild neurocognitive disorder Continue donezepil and Namenda. Supportive care.   Pseudohypoglycemia Corrected calcium for albumin 10.0.      Mild protein malnutrition (HCC) Protein supplementation.   Consider nutritional services evaluation.     Normocytic anemia Monitor H&H.  Physical debility PT OT to assess when no longer agitated Fall precaution  Critical care time: 65 minutes.     DVT prophylaxis:      Lovenox SQ daily. Code Status:              Full code. Family Communication:  None at bedside at the time  of the visit. Disposition Plan:              Patient is from:                        Home.             Anticipated DC to:                   Home.             Anticipated DC date:               02/28/2021.             Anticipated DC barriers:         Clinical status.           Consults called:    None. Admission status:     Inpatient  Status is: Inpatient status.  Patient requires at least 2 midnights for further evaluation and treatment of present condition.       Objective: Vitals:   02/27/21 2047 02/28/21 0027 02/28/21 0438 02/28/21 0958  BP: (!) 161/77 (!) 128/105 (!) 132/108 (!) 159/95  Pulse: 79 84 81 89  Resp: 16 16 16 20   Temp:  99.2 F (37.3 C) 98.9 F (37.2 C) 98 F (36.7 C)  TempSrc:  Oral Axillary Axillary  SpO2: 97% 96% 98% 95%  Weight:      Height:        Intake/Output Summary (Last 24 hours) at 02/28/2021 1136 Last data filed at 02/28/2021 1109 Gross per 24 hour  Intake 480 ml  Output 375 ml  Net 105 ml   Filed Weights   02/26/21 1034 02/26/21 1214  Weight: 82 kg 81.6 kg    Exam:  General: 85 y.o. year-old male frail-appearing no acute distress.  He is restless and mumbles. Cardiovascular: Irregular rate and rhythm no rubs or gallops.   Respiratory: Mild rales at bases no wheezing noted.  Poor inspiratory effort.   Abdomen: Soft nontender normal bowel sounds present.   Musculoskeletal: No lower extremity edema bilaterally.   Skin: No ulcerative lesions noted. Psychiatry: Restless, confused. Neuro: Moves all 4 extremities freely.   Data Reviewed: CBC: Recent Labs  Lab 02/26/21 1136 02/27/21 0545 02/28/21 0359  WBC 5.7 7.1 8.1  NEUTROABS 4.3 5.5 6.7  HGB 12.9* 14.0 13.8  HCT 40.1 41.5 41.7  MCV 95.5 91.2 90.8  PLT 138* 144* 270*   Basic Metabolic Panel: Recent Labs  Lab 02/26/21 1136 02/26/21 1332 02/27/21 0545 02/28/21 0359  NA 135  --  135 138  K 3.5  --  3.8 3.4*  CL 105  --  105 105  CO2 20*  --  22 27  GLUCOSE 81  --   95 115*  BUN 18  --  19 20  CREATININE 0.66  --  1.03 0.89  CALCIUM 8.4*  --  8.5* 9.0  MG  --  2.1 2.3 2.2  PHOS  --  3.3 3.6 3.6   GFR: Estimated Creatinine Clearance: 71.1 mL/min (by C-G formula based on SCr of 0.89 mg/dL). Liver Function Tests: Recent Labs  Lab 02/26/21 1136 02/27/21 0545 02/28/21 0359  AST 85* 123* 105*  ALT 33 47* 52*  ALKPHOS 56 54 53  BILITOT 2.1* 2.1* 1.7*  PROT 6.3* 6.0* 6.1*  ALBUMIN 3.6 3.4* 3.4*   No results for input(s): LIPASE, AMYLASE in  the last 168 hours. No results for input(s): AMMONIA in the last 168 hours. Coagulation Profile: No results for input(s): INR, PROTIME in the last 168 hours. Cardiac Enzymes: No results for input(s): CKTOTAL, CKMB, CKMBINDEX, TROPONINI in the last 168 hours. BNP (last 3 results) No results for input(s): PROBNP in the last 8760 hours. HbA1C: Recent Labs    02/27/21 0545  HGBA1C 5.7*   CBG: Recent Labs  Lab 02/26/21 2307 02/27/21 0834 02/27/21 1219 02/27/21 2158 02/28/21 0831  GLUCAP 123* 110* 97 171* 126*   Lipid Profile: No results for input(s): CHOL, HDL, LDLCALC, TRIG, CHOLHDL, LDLDIRECT in the last 72 hours. Thyroid Function Tests: No results for input(s): TSH, T4TOTAL, FREET4, T3FREE, THYROIDAB in the last 72 hours. Anemia Panel: Recent Labs    02/27/21 0545 02/28/21 0359  FERRITIN 358* 370*   Urine analysis:    Component Value Date/Time   COLORURINE YELLOW 02/26/2021 [place] 02/26/2021 1532   LABSPEC 1.030 02/26/2021 1532   PHURINE 6.0 02/26/2021 1532   GLUCOSEU NEGATIVE 02/26/2021 1532   HGBUR SMALL (A) 02/26/2021 1532   HGBUR negative 03/29/2010 0812   BILIRUBINUR NEGATIVE 02/26/2021 1532   BILIRUBINUR n 03/28/2016 1137   KETONESUR 20 (A) 02/26/2021 1532   PROTEINUR NEGATIVE 02/26/2021 1532   UROBILINOGEN 0.2 03/28/2016 1137   UROBILINOGEN 0.2 03/29/2010 0812   NITRITE NEGATIVE 02/26/2021 1532   LEUKOCYTESUR NEGATIVE 02/26/2021 1532   Sepsis  Labs: @LABRCNTIP (procalcitonin:4,lacticidven:4)  ) Recent Results (from the past 240 hour(s))  Resp Panel by RT-PCR (Flu A&B, Covid) Nasopharyngeal Swab     Status: Abnormal   Collection Time: 02/26/21 11:36 AM   Specimen: Nasopharyngeal Swab; Nasopharyngeal(NP) swabs in vial transport medium  Result Value Ref Range Status   SARS Coronavirus 2 by RT PCR POSITIVE (A) NEGATIVE Final    Comment: (NOTE) SARS-CoV-2 target nucleic acids are DETECTED.  The SARS-CoV-2 RNA is generally detectable in upper respiratory specimens during the acute phase of infection. Positive results are indicative of the presence of the identified virus, but do not rule out bacterial infection or co-infection with other pathogens not detected by the test. Clinical correlation with patient history and other diagnostic information is necessary to determine patient infection status. The expected result is Negative.  Fact Sheet for Patients: EntrepreneurPulse.com.au  Fact Sheet for Healthcare Providers: IncredibleEmployment.be  This test is not yet approved or cleared by the [place] FDA and  has been authorized for detection and/or diagnosis of SARS-CoV-2 by FDA under an Emergency Use Authorization (EUA).  This EUA will remain in effect (meaning this test can be used) for the duration of  the COVID-19 declaration under Section 564(b)(1) of the A ct, 21 U.S.C. section 360bbb-3(b)(1), unless the authorization is terminated or revoked sooner.     Influenza A by PCR NEGATIVE NEGATIVE Final   Influenza B by PCR NEGATIVE NEGATIVE Final    Comment: (NOTE) The Xpert Xpress SARS-CoV-2/FLU/RSV plus assay is intended as an aid in the diagnosis of influenza from Nasopharyngeal swab specimens and should not be used as a sole basis for treatment. Nasal washings and aspirates are unacceptable for Xpert Xpress SARS-CoV-2/FLU/RSV testing.  Fact Sheet for  Patients: EntrepreneurPulse.com.au  Fact Sheet for Healthcare Providers: IncredibleEmployment.be  This test is not yet approved or cleared by the [place] FDA and has been authorized for detection and/or diagnosis of SARS-CoV-2 by FDA under an Emergency Use Authorization (EUA). This EUA will remain in effect (meaning this test can be used) for  the duration of the COVID-19 declaration under Section 564(b)(1) of the Act, 21 U.S.C. section 360bbb-3(b)(1), unless the authorization is terminated or revoked.  Performed at [facility], [place] [address] [place], [place] [address]       Studies: No results found.  Scheduled Meds:  vitamin C  500 mg Oral Daily   chlorhexidine  15 mL Mouth Rinse BID   cholecalciferol  1,000 Units Oral Daily   donepezil  10 mg Oral QHS   enoxaparin (LOVENOX) injection  40 mg Subcutaneous Q24H   mouth rinse  15 mL Mouth Rinse q12n4p   melatonin  3 mg Oral QHS   memantine  10 mg Oral BID   nirmatrelvir/ritonavir EUA  3 tablet Oral BID   QUEtiapine  12.5 mg Oral BID   traZODone  50 mg Oral QHS   zinc sulfate  220 mg Oral Daily    Continuous Infusions:  ampicillin-sulbactam (UNASYN) IV     lactated ringers 50 mL/hr at 02/28/21 1048     LOS: 1 day     Kayleen Memos, MD [facility] Pager [phone]  If 7PM-7AM, please contact night-coverage [email or web address] Password [facility] 02/28/2021, 11:36 AM

## 2021-02-28 NOTE — Progress Notes (Signed)
Echocardiogram 2D Echocardiogram has been performed.  Oneal Deputy Sylina Henion RDCS 02/28/2021, 2:23 PM

## 2021-02-28 NOTE — Progress Notes (Signed)
ANTICOAGULATION CONSULT NOTE - follow up  Pharmacy Consult for heparin Indication: atrial fibrillation  No Known Allergies  Patient Measurements: Height: 6\' 1"  (185.4 cm) Weight: 81.6 kg (180 lb) IBW/kg (Calculated) : 79.9 Heparin Dosing Weight: n/a. Use TBW  Vital Signs: Temp: 99.4 F (37.4 C) (12/18 1934) Temp Source: Axillary (12/18 1934) BP: 163/63 (12/18 2309) Pulse Rate: 70 (12/18 2309)  Labs: Recent Labs    02/26/21 1136 02/27/21 0545 02/28/21 0359 02/28/21 1308 02/28/21 2313  HGB 12.9* 14.0 13.8  --   --   HCT 40.1 41.5 41.7  --   --   PLT 138* 144* 144*  --   --   HEPARINUNFRC  --   --   --   --  0.49  CREATININE 0.66 1.03 0.89 0.89  --      Estimated Creatinine Clearance: 71.1 mL/min (by C-G formula based on SCr of 0.89 mg/dL).   Medical History: Past Medical History:  Diagnosis Date   Gallstones    asymptomatic   Hearing loss of right ear 01/23/2017   Hepatic Cysts    Hyperlipidemia    Hyperthyroidism 01/28/2013   Mild neurocognitive disorder 09/13/2016   Prostatitis    requiring a trip to the ER in [place] with a fever - 09   Type 2 diabetes mellitus without complication, without long-term current use of insulin ([place]) 03/30/2015    Medications: Patient not on anticoagulants PTA  Assessment: Pt is an 89 yoM admitted with COVID PNA. Pharmacy consulted to dose heparin for new onset afib.   Today, 02/28/21 CBC: Hgb WNL, Plt slightly low but stable Currently prescribed enoxaparin 40 mg subQ daily for DVT ppx, last dose 12/17 @ 2147 SCr WNL  1st HL 0.49 therapeutic on 1200 units/hr Per RN no bleeding or infusion issues  Goal of Therapy:  Heparin level 0.3-0.7 units/ml Monitor platelets by anticoagulation protocol: Yes   Plan:  Continue heparin drip at 1200 units/hr Confirmatory level in 8 hours Daily CBC  Dolly Rias RPh 02/28/2021, 11:44 PM

## 2021-02-28 NOTE — Progress Notes (Signed)
Patient reportedly confused during the night.  This morning, able to answer A and O questions, but rambles incoherently and incessantly.  Patient rarely opens eyes, and when he does, must be directed to look in front of him (and not up at the ceiling) when staff are talking to him.  RN concerned that patient is aspirating when drinking thin liquids.  MD aware, SLP consult ordered, patient made NPO.  Angie Fava, RN

## 2021-02-28 NOTE — Consult Note (Signed)
Cardiology consultation note::   Patient ID: Erik Lara MRN: [id number]; DOB: May 26, 1935   Admission date: 02/26/2021  PCP:  Dorothyann Peng, NP   [facility] Providers Cardiologist:  None        Chief Complaint: Atrial fibrillation  Patient Profile:  Erik Lara is a 85 y.o. male who is being seen today for the evaluation of new onset atrial fibrillation with rapid ventricular response at the request of Irene Pap, MD  Erik Lara is a 85 y.o. male with dementia (described in some notes as Lewy body disease), hyperlipidemia, treated hypothyroidism, type 2 diabetes mellitus, cholelithiasis, treated hypothyroidism who is being seen 02/28/2021 for the evaluation of atrial fibrillation with rapid ventricular response during acute COVID-19 infection.  History of Present Illness:   Erik Lara is unable to provide history.  He is delirious, confused and agitated.  Family is not available at this time.  The history is obtained from the chart.  He was admitted with a fall without syncope, preceded by fatigue, generalized weakness and poor intake of food and fluids.  He was found to have COVID-19 infection.  He has had a steady cognitive decline in the last 2 years but a marked decline just in the last few days.  He has developed new urinary and fecal incontinence.  On arrival to the hospital he was found to have COVID-19 infection and was started on Paxlovid.  On arrival he had normal sinus rhythm with occasional PVCs (ECG also shows right bundle branch block of unknown chronicity), but he has subsequently converted to atrial fibrillation with rapid ventricular response.  He has been started on amiodarone drip.  His mental status has worsened further and he cannot communicate.  He remains alert and moves his limbs about without a clear purposeful pattern.  There is a plan to transfer him to the intensive care unit.  He has been in atrial fibrillation since around 12:30 PM,  with ventricular rates around 150 bpm.  His monitor does show that he occasionally returns to sinus rhythm with frequent PACs, for brief periods of time.  When in atrial fibrillation, his ventricular rate is around 120 (on intravenous amiodarone, just started).  His echocardiogram is a difficult study, hard to interpret since he would not hold still during image acquisition.  Overall, it shows normal right and left ventricular systolic function.  Diastolic left ventricular function cannot be analyzed.  There are no major valvular abnormalities.  There is no pericardial effusion.  This a dental note is made of multiple hypoechoic lesions in the left lobe of his liver and a remote CT of the abdomen in 2009 showed "polycystic liver disease" as well as renal cysts.  His chest x-ray shows no evidence of significant acute pulmonary infiltrates or cardiac abnormalities.  He does have aortic atherosclerosis.  Labs are significant for normal renal function, minimal elevation in transaminases and bilirubin, normal alkaline phosphatase, mild hypokalemia.  Inflammatory markers are elevated (ferritin 370, CRP 7.6 and rising, D-dimer 1.06 and rising), but procalcitonin is less than 0.10. TSH is borderline reduced.  Lipid profile earlier this year showed low parameters within normal range.   Past Medical History:  Diagnosis Date   Gallstones    asymptomatic   Hearing loss of right ear 01/23/2017   Hepatic Cysts    Hyperlipidemia    Hyperthyroidism 01/28/2013   Mild neurocognitive disorder 09/13/2016   Prostatitis    requiring a trip to the ER in [place] with a  fever - 09   Type 2 diabetes mellitus without complication, without long-term current use of insulin ([place]) 03/30/2015    Past Surgical History:  Procedure Laterality Date   HERNIA REPAIR     Childhood     Medications Prior to Admission: Prior to Admission medications   Medication Sig Start Date End Date Taking? Authorizing Provider   cetirizine (ZYRTEC) 10 MG tablet Take 10 mg by mouth daily.   Yes [provider]  donepezil (ARICEPT) 10 MG tablet Take 1 tablet (10 mg total) by mouth at bedtime. 10/13/20  Yes Cameron Sprang, MD  memantine (NAMENDA) 10 MG tablet Take 1 tablet twice a day Patient taking differently: Take 10 mg by mouth 2 (two) times daily. 12/11/20  Yes Cameron Sprang, MD  simvastatin (ZOCOR) 20 MG tablet TAKE ONE TABLET BY MOUTH EVERY NIGHT AT BEDTIME Patient taking differently: Take 20 mg by mouth at bedtime. 10/05/20  Yes Nafziger, Tommi Rumps, NP     Allergies:   No Known Allergies  Social History:   Social History   Socioeconomic History   Marital status: Married    Spouse name: Not on file   Number of children: 3   Years of education: 18   Highest education level: Master's degree (e.g., MA, MS, MEng, MEd, MSW, MBA)  Occupational History   Occupation: Retired  Tobacco Use   Smoking status: Never   Smokeless tobacco: Never  Vaping Use   Vaping Use: Never used  Substance and Sexual Activity   Alcohol use: Not Currently    Alcohol/week: 0.0 standard drinks    Comment: Beer weekly/glass of wine daily   Drug use: No   Sexual activity: Not Currently    Partners: Female  Other Topics Concern   Not on file  Social History Narrative   Lives with wife      Right handed      Master's Degree from [employer]   Social Determinants of Health   Financial Resource Strain: Not on file  Food Insecurity: Not on file  Transportation Needs: Not on file  Physical Activity: Not on file  Stress: Not on file  Social Connections: Not on file  Intimate Partner Violence: Not on file    Family History:   The patient's family history includes Cancer in an other family member; Diabetes in an other family member.    ROS:  Please see the history of present illness.  All other ROS reviewed and negative.     Physical Exam/Data:   Vitals:   02/28/21 1301 02/28/21 1330 02/28/21 1435 02/28/21 1600  BP:   106/70 117/86 122/72  Pulse: (!) 135   (!) 132  Resp:      Temp:    97.6 F (36.4 C)  TempSrc:    Oral  SpO2:    98%  Weight:      Height:        Intake/Output Summary (Last 24 hours) at 02/28/2021 1633 Last data filed at 02/28/2021 1606 Gross per 24 hour  Intake 1821 ml  Output 575 ml  Net 1246 ml   Last 3 Weights 02/26/2021 02/26/2021 12/11/2020  Weight (lbs) 180 lb 180 lb 12.4 oz 185 lb 9.6 oz  Weight (kg) 81.647 kg 82 kg 84.188 kg     Body mass index is 23.75 kg/m.  General: Confused, agitated, stuporous, moving limbs without clear purpose HEENT: normal Neck: no JVD Vascular: No carotid bruits; Distal pulses 2+ bilaterally   Cardiac:  normal S1, S2;  rapid and irregular rhythm; no murmur  Lungs:  clear to auscultation bilaterally, no wheezing, rhonchi or rales  Abd: soft, nontender, no hepatomegaly  Ext: no edema Musculoskeletal:  No deformities, BUE and BLE strength normal and equal Skin: warm and dry  Neuro:  CNs 2-12 intact, no focal abnormalities noted Psych:  Normal affect    EKG:  The ECG that was done earlier today shows atrial fibrillation with rapid ventricular response.  The tracing from 02/26/2021 was also personally reviewed and demonstrates sinus rhythm with a single PVC and right bundle branch block.  There are study shows ischemic repolarization abnormalities.  Relevant CV Studies: Echocardiogram 02/28/2021   1. Left ventricular ejection fraction, by estimation, is 60 to 65%. The  left ventricle has normal function. Left ventricular endocardial border  not optimally defined to evaluate regional wall motion. Left ventricular  diastolic function could not be  evaluated.   2. Right ventricular systolic function is normal. The right ventricular  size is normal.   3. Multiple hypoechoic lesions are seen in the left lobe of the liver  (probably cysts).   4. The mitral valve is normal in structure. No evidence of mitral valve  regurgitation. No evidence  of mitral stenosis.   5. The aortic valve is normal in structure. Aortic valve regurgitation is  not visualized. Aortic valve sclerosis is present, with no evidence of  aortic valve stenosis.   6. The inferior vena cava is normal in size with greater than 50%  respiratory variability, suggesting right atrial pressure of 3 mmHg.   Conclusion(s)/Recommendation(s): Limited study due to lack of patient  cooperation, but no significant cardiac abnormalities are identified.  Laboratory Data:  High Sensitivity Troponin:  No results for input(s): TROPONINIHS in the last 720 hours.    Chemistry Recent Labs  Lab 02/27/21 0545 02/28/21 0359 02/28/21 1308  NA 135 138 135  K 3.8 3.4* 3.4*  CL 105 105 107  CO2 22 27 20*  GLUCOSE 95 115* 210*  BUN 19 20 22   CREATININE 1.03 0.89 0.89  CALCIUM 8.5* 9.0 8.7*  MG 2.3 2.2  --   GFRNONAA >60 >60 >60  ANIONGAP 8 6 8     Recent Labs  Lab 02/27/21 0545 02/28/21 0359  PROT 6.0* 6.1*  ALBUMIN 3.4* 3.4*  AST 123* 105*  ALT 47* 52*  ALKPHOS 54 53  BILITOT 2.1* 1.7*   Lipids No results for input(s): CHOL, TRIG, HDL, LABVLDL, LDLCALC, CHOLHDL in the last 168 hours. Hematology Recent Labs  Lab 02/27/21 0545 02/28/21 0359  WBC 7.1 8.1  RBC 4.55 4.59  HGB 14.0 13.8  HCT 41.5 41.7  MCV 91.2 90.8  MCH 30.8 30.1  MCHC 33.7 33.1  RDW 13.2 13.2  PLT 144* 144*   Thyroid  Recent Labs  Lab 02/28/21 1308  TSH 0.143*   BNPNo results for input(s): BNP, PROBNP in the last 168 hours.  DDimer  Recent Labs  Lab 02/28/21 0359  DDIMER 1.06*     Radiology/Studies:  DG CHEST PORT 1 VIEW  Result Date: 02/28/2021 CLINICAL DATA:  85 year old male with history of altered mental status. EXAM: PORTABLE CHEST 1 VIEW COMPARISON:  Chest x-ray 02/26/2021. FINDINGS: Skin fold artifact projecting over the thorax bilaterally. Lung volumes are low. No consolidative airspace disease. No pleural effusions. No pneumothorax. No pulmonary nodule or mass noted.  Pulmonary vasculature and the cardiomediastinal silhouette are within normal limits. Atherosclerotic calcifications are noted in the thoracic aorta. IMPRESSION: 1. Low lung volumes without  radiographic evidence of acute cardiopulmonary disease. 2. Aortic atherosclerosis. Electronically Signed   By: Vinnie Langton M.D.   On: 02/28/2021 11:53   ECHOCARDIOGRAM LIMITED  Result Date: 02/28/2021    ECHOCARDIOGRAM LIMITED REPORT   Patient Name:   Erik Lara Date of Exam: 02/28/2021 Medical Rec #:  [id number]        Height:       73.0 in Accession #:    [id number]       Weight:       180.0 lb Date of Birth:  [birth year]        BSA:          2.057 m Patient Age:    80 years         BP:           119/73 mmHg Patient Gender: M                HR:           139 bpm. Exam Location:  Inpatient Procedure: Limited Echo, Color Doppler and Cardiac Doppler Indications:    I48.91* Unspecified atrial fibrillation  History:        Patient has no prior history of Echocardiogram examinations.                 Risk Factors:Diabetes and Dyslipidemia.  Sonographer:    Raquel Sarna Senior RDCS Referring Phys: [id number] [place]  Sonographer Comments: COVID+ Technically difficult study, patient confused and pushing away probe. IMPRESSIONS  1. Left ventricular ejection fraction, by estimation, is 60 to 65%. The left ventricle has normal function. Left ventricular endocardial border not optimally defined to evaluate regional wall motion. Left ventricular diastolic function could not be evaluated.  2. Right ventricular systolic function is normal. The right ventricular size is normal.  3. Multiple hypoechoic lesions are seen in the left lobe of the liver (probably cysts).  4. The mitral valve is normal in structure. No evidence of mitral valve regurgitation. No evidence of mitral stenosis.  5. The aortic valve is normal in structure. Aortic valve regurgitation is not visualized. Aortic valve sclerosis is present, with no evidence of aortic  valve stenosis.  6. The inferior vena cava is normal in size with greater than 50% respiratory variability, suggesting right atrial pressure of 3 mmHg. Conclusion(s)/Recommendation(s): Limited study due to lack of patient cooperation, but no significant cardiac abnormalities are identified. FINDINGS  Left Ventricle: Left ventricular ejection fraction, by estimation, is 60 to 65%. The left ventricle has normal function. Left ventricular endocardial border not optimally defined to evaluate regional wall motion. The left ventricular internal cavity size was normal in size. There is no left ventricular hypertrophy. Left ventricular diastolic function could not be evaluated. Right Ventricle: The right ventricular size is normal. No increase in right ventricular wall thickness. Right ventricular systolic function is normal. Left Atrium: Left atrial size was normal in size. Right Atrium: Right atrial size was normal in size. Pericardium: Multiple hypoechoic lesions are seen in the left lobe of the liver (probably cysts). There is no evidence of pericardial effusion. Mitral Valve: The mitral valve is normal in structure. No evidence of mitral valve stenosis. Tricuspid Valve: The tricuspid valve is normal in structure. Tricuspid valve regurgitation is not demonstrated. No evidence of tricuspid stenosis. Aortic Valve: The aortic valve is normal in structure. Aortic valve regurgitation is not visualized. Aortic valve sclerosis is present, with no evidence of aortic valve stenosis. Pulmonic Valve: The pulmonic valve was  normal in structure. Pulmonic valve regurgitation is not visualized. No evidence of pulmonic stenosis. Aorta: The aortic root is normal in size and structure. Venous: The inferior vena cava is normal in size with greater than 50% respiratory variability, suggesting right atrial pressure of 3 mmHg. IAS/Shunts: No atrial level shunt detected by color flow Doppler. AORTIC VALVE LVOT Vmax:   51.60 cm/s LVOT Vmean:   38.200 cm/s LVOT VTI:    0.098 m  SHUNTS Systemic VTI: 0.10 m Dani Gobble Vasilia Dise MD Electronically signed by Sanda Klein MD Signature Date/Time: 02/28/2021/2:36:22 PM    Final      Assessment and Plan:   AFib: New onset atrial fibrillation with rapid ventricular response during acute viral illness.  He appears to be critically ill.  Unable to take oral medications.  Starting on amiodarone drip, which I think is a good choice at this time.  Meets criteria for anticoagulation.  Would recommend intravenous heparin until we clarify his prognosis.  Although his echocardiogram is not great quality, he does not have any obvious significant structural heart disease underlying the arrhythmia. HTN: His blood pressure has been highly variable and correlating with his degree of agitation.  Intravenous metoprolol will be a good choice for use "as needed" since this will also help with ventricular rate control. Dementia: Most recent evaluation with a DaT scan in September 2022 (near absent activity in the posterior striata bilaterally, decreased tracer activity in the heads of the caudate nuclei bilaterally) suggests that he truly does have Lewy body dementia, which would explain his relatively rapid decline.  It is hard to know what his baseline functional status is without his family at bedside, but it sounds like he was not doing too well, even before the acute illness, based on his most recent neurology office notes.  Functional status was poor as far as I can tell.  This will have a major negative impact on his prognosis.  Recommend DNR discussion and consideration of palliative care.  I do not think there is any interaction between Paxlovid and memantine-donepezil. HLP: Hold the statin while he is receiving Paxlovid. DM: Very well controlled with diet only.  Most recent hemoglobin A1c 5.7%. Mildly decreased TSH: Consider sick euthyroid syndrome, but note that his TSH was also slightly depressed in February 2022.   He may have subclinical hyperthyroidism.   Risk Assessment/Risk Scores:         CHA2DS2-VASc Score = 4   This indicates a 4.8% annual risk of stroke. The patient's score is based upon: CHF History: 0 HTN History: 1 Diabetes History: 1 Stroke History: 0 Vascular Disease History: 0 Age Score: 2 Gender Score: 0       For questions or updates, please contact [place] Please consult [email or web address] for contact info under     Signed, Sanda Klein, MD  02/28/2021 4:33 PM

## 2021-02-28 NOTE — Plan of Care (Signed)
°  Problem: Clinical Measurements: Goal: Will remain free from infection Outcome: Progressing Goal: Respiratory complications will improve Outcome: Progressing Goal: Cardiovascular complication will be avoided Outcome: Progressing   Problem: Pain Managment: Goal: General experience of comfort will improve Outcome: Progressing   Problem: Safety: Goal: Ability to remain free from injury will improve Outcome: Progressing   Problem: Skin Integrity: Goal: Risk for impaired skin integrity will decrease Outcome: Progressing   Problem: Respiratory: Goal: Will maintain a patent airway Outcome: Progressing Goal: Complications related to the disease process, condition or treatment will be avoided or minimized Outcome: Progressing   Problem: Education: Goal: Knowledge of General Education information will improve Description: Including pain rating scale, medication(s)/side effects and non-pharmacologic comfort measures Outcome: Not Progressing   Problem: Health Behavior/Discharge Planning: Goal: Ability to manage health-related needs will improve Outcome: Not Progressing   Problem: Clinical Measurements: Goal: Ability to maintain clinical measurements within normal limits will improve Outcome: Not Progressing Goal: Diagnostic test results will improve Outcome: Not Progressing   Problem: Activity: Goal: Risk for activity intolerance will decrease Outcome: Not Progressing   Problem: Nutrition: Goal: Adequate nutrition will be maintained Outcome: Not Progressing   Problem: Coping: Goal: Level of anxiety will decrease Outcome: Not Progressing   Problem: Elimination: Goal: Will not experience complications related to bowel motility Outcome: Not Progressing Goal: Will not experience complications related to urinary retention Outcome: Not Progressing   Problem: Education: Goal: Knowledge of risk factors and measures for prevention of condition will improve Outcome: Not  Progressing   Problem: Coping: Goal: Psychosocial and spiritual needs will be supported Outcome: Not Progressing

## 2021-02-28 NOTE — Progress Notes (Signed)
Pharmacy Antibiotic Note  Erik Lara is a 85 y.o. male admitted on 02/26/2021 with [id number], acute metabolic encephalopathy. Due to concern for aspiration pneumonia, pharmacy consulted today for Unasyn dosing.  Plan: Unasyn 3g IV q6h   Need for further dosage adjustment appears unlikely at present, so pharmacy will sign off at this time.  Please reconsult if a change in clinical status warrants re-evaluation of dosage.   Height: 6\' 1"  (185.4 cm) Weight: 81.6 kg (180 lb) IBW/kg (Calculated) : 79.9  Temp (24hrs), Avg:98.3 F (36.8 C), Min:97.8 F (36.6 C), Max:99.2 F (37.3 C)  Recent Labs  Lab 02/26/21 1136 02/27/21 0545 02/28/21 0359  WBC 5.7 7.1 8.1  CREATININE 0.66 1.03 0.89    Estimated Creatinine Clearance: 71.1 mL/min (by C-G formula based on SCr of 0.89 mg/dL).    No Known Allergies   Thank you for allowing pharmacy to be a part of this patients care.   Lindell Spar, PharmD, BCPS Clinical Pharmacist  02/28/2021 11:19 AM

## 2021-02-28 NOTE — Progress Notes (Signed)
PT Cancellation Note  Patient Details Name: Erik Lara MRN: [id number] DOB: January 30, 1936   Cancelled Treatment:    Reason Eval/Treat Not Completed: Medical issues which prohibited therapy--pt now in Afib per chart review. also not following commands well. will hold PT today and check back another day.    [facility]  Office: [phone] Pager: [phone]

## 2021-02-28 NOTE — Progress Notes (Signed)
OT Cancellation Note  Patient Details Name: Erik Lara MRN: [id number] DOB: January 11, 1936   Cancelled Treatment:    Reason Eval/Treat Not Completed: Other (comment). Patient confused, unable to follow commands and Rn reports new heart arrhythmias. Will hold today and f/u as able.  Demonie Kassa L Omar Gayden 02/28/2021, 12:47 PM

## 2021-02-28 NOTE — Progress Notes (Signed)
ANTICOAGULATION CONSULT NOTE - Initial Consult  Pharmacy Consult for heparin Indication: atrial fibrillation  No Known Allergies  Patient Measurements: Height: 6\' 1"  (185.4 cm) Weight: 81.6 kg (180 lb) IBW/kg (Calculated) : 79.9 Heparin Dosing Weight: n/a. Use TBW  Vital Signs: Temp: 98 F (36.7 C) (12/18 0958) Temp Source: Axillary (12/18 0958) BP: 117/86 (12/18 1435) Pulse Rate: 135 (12/18 1301)  Labs: Recent Labs    02/26/21 1136 02/27/21 0545 02/28/21 0359 02/28/21 1308  HGB 12.9* 14.0 13.8  --   HCT 40.1 41.5 41.7  --   PLT 138* 144* 144*  --   CREATININE 0.66 1.03 0.89 0.89    Estimated Creatinine Clearance: 71.1 mL/min (by C-G formula based on SCr of 0.89 mg/dL).   Medical History: Past Medical History:  Diagnosis Date   Gallstones    asymptomatic   Hearing loss of right ear 01/23/2017   Hepatic Cysts    Hyperlipidemia    Hyperthyroidism 01/28/2013   Mild neurocognitive disorder 09/13/2016   Prostatitis    requiring a trip to the ER in [place] with a fever - 09   Type 2 diabetes mellitus without complication, without long-term current use of insulin ([place]) 03/30/2015    Medications: Patient not on anticoagulants PTA  Assessment: Pt is an 72 yoM admitted with COVID PNA. Pharmacy consulted to dose heparin for new onset afib.   Today, 02/28/21 CBC: Hgb WNL, Plt slightly low but stable Currently prescribed enoxaparin 40 mg subQ daily for DVT ppx, last dose 12/17 @ 2147 SCr WNL  Goal of Therapy:  Heparin level 0.3-0.7 units/ml Monitor platelets by anticoagulation protocol: Yes   Plan:  Discontinue enoxaparin Give 4000 units bolus x 1 Start heparin infusion at 1200 units/hr Check anti-Xa level in 8 hours and daily while on heparin Continue to monitor H&H and platelets  Lenis Noon, PharmD 02/28/2021,3:03 PM

## 2021-02-28 NOTE — Progress Notes (Addendum)
Patient in Afib w/ RVR beginning at 1240 (previously in NSR), HR 120s-140s at rest.  MD notified, new orders for metoprolol placed.  Metoprolol administered at 1301.  HR in 130s at 1311.  Rapid Response RN notified at 1328.  HR 120s-150s.  1 L NS bolus at 999/hr started at 1340 per order.  Amiodarone gtt started at 1400 per MD order.  Patient placed on bedside monitor at 1435.     Angie Fava, RN

## 2021-03-01 DIAGNOSIS — U071 COVID-19: Principal | ICD-10-CM

## 2021-03-01 LAB — MAGNESIUM: Magnesium: 2 mg/dL (ref 1.7–2.4)

## 2021-03-01 LAB — COMPREHENSIVE METABOLIC PANEL
ALT: 41 U/L (ref 0–44)
AST: 64 U/L — ABNORMAL HIGH (ref 15–41)
Albumin: 2.9 g/dL — ABNORMAL LOW (ref 3.5–5.0)
Alkaline Phosphatase: 46 U/L (ref 38–126)
Anion gap: 8 (ref 5–15)
BUN: 20 mg/dL (ref 8–23)
CO2: 21 mmol/L — ABNORMAL LOW (ref 22–32)
Calcium: 8.4 mg/dL — ABNORMAL LOW (ref 8.9–10.3)
Chloride: 109 mmol/L (ref 98–111)
Creatinine, Ser: 1.18 mg/dL (ref 0.61–1.24)
GFR, Estimated: >60 mL/min (ref >60)
Glucose, Bld: 108 mg/dL — ABNORMAL HIGH (ref 70–99)
Potassium: 3.3 mmol/L — ABNORMAL LOW (ref 3.5–5.1)
Sodium: 138 mmol/L (ref 135–145)
Total Bilirubin: 1.5 mg/dL — ABNORMAL HIGH (ref 0.3–1.2)
Total Protein: 5.3 g/dL — ABNORMAL LOW (ref 6.5–8.1)

## 2021-03-01 LAB — CBC WITH DIFFERENTIAL/PLATELET
Abs Immature Granulocytes: 0.03 10*3/uL (ref 0.00–0.07)
Basophils Absolute: 0 10*3/uL (ref 0.0–0.1)
Basophils Relative: 0 %
Eosinophils Absolute: 0 10*3/uL (ref 0.0–0.5)
Eosinophils Relative: 0 %
HCT: 36.9 % — ABNORMAL LOW (ref 39.0–52.0)
Hemoglobin: 12.4 g/dL — ABNORMAL LOW (ref 13.0–17.0)
Immature Granulocytes: 0 %
Lymphocytes Relative: 10 %
Lymphs Abs: 0.7 10*3/uL (ref 0.7–4.0)
MCH: 30.8 pg (ref 26.0–34.0)
MCHC: 33.6 g/dL (ref 30.0–36.0)
MCV: 91.6 fL (ref 80.0–100.0)
Monocytes Absolute: 0.6 10*3/uL (ref 0.1–1.0)
Monocytes Relative: 9 %
Neutro Abs: 5.4 10*3/uL (ref 1.7–7.7)
Neutrophils Relative %: 81 %
Platelets: 132 10*3/uL — ABNORMAL LOW (ref 150–400)
RBC: 4.03 MIL/uL — ABNORMAL LOW (ref 4.22–5.81)
RDW: 13.4 % (ref 11.5–15.5)
WBC: 6.7 10*3/uL (ref 4.0–10.5)
nRBC: 0 % (ref 0.0–0.2)

## 2021-03-01 LAB — FERRITIN: Ferritin: 388 ng/mL — ABNORMAL HIGH (ref 24–336)

## 2021-03-01 LAB — GLUCOSE, CAPILLARY
Glucose-Capillary: 80 mg/dL (ref 70–99)
Glucose-Capillary: 95 mg/dL (ref 70–99)
Glucose-Capillary: 91 mg/dL (ref 70–99)
Glucose-Capillary: 78 mg/dL (ref 70–99)

## 2021-03-01 LAB — PHOSPHORUS: Phosphorus: 3.6 mg/dL (ref 2.5–4.6)

## 2021-03-01 LAB — D-DIMER, QUANTITATIVE: D-Dimer, Quant: 0.74 ug/mL-FEU — ABNORMAL HIGH (ref 0.00–0.50)

## 2021-03-01 LAB — HEPARIN LEVEL (UNFRACTIONATED): Heparin Unfractionated: 0.4 IU/mL (ref 0.30–0.70)

## 2021-03-01 LAB — C-REACTIVE PROTEIN: CRP: 7.9 mg/dL — ABNORMAL HIGH (ref <1.0)

## 2021-03-01 MED ORDER — POTASSIUM CHLORIDE 10 MEQ/100ML IV SOLN
10.0000 meq | INTRAVENOUS | Status: AC
  Administered 2021-03-01 (×4): 10 meq via INTRAVENOUS
  Filled 2021-03-01 (×4): qty 100

## 2021-03-01 MED ORDER — SODIUM CHLORIDE 0.9 % IV SOLN
3.0000 g | Freq: Four times a day (QID) | INTRAVENOUS | Status: DC
Start: 2021-03-01 — End: 2021-03-02
  Administered 2021-03-01 – 2021-03-02 (×3): 3 g via INTRAVENOUS
  Filled 2021-03-01 (×4): qty 8

## 2021-03-01 NOTE — Progress Notes (Signed)
02/28/2021 Patient unable to take sips of water from straw, unable to have patient swallow oral medications as a result. Contacted on call provider regarding options for medication administration. Instructions from [facility] APP to hold oral medications; none to be given until swallow evaluation completed. Cindy S. Brigitte Pulse BSN, RN, [place] 03/01/2021 3:00 AM

## 2021-03-01 NOTE — Progress Notes (Signed)
Initial Nutrition Assessment  DOCUMENTATION CODES:   Not applicable  INTERVENTION:  - diet advancement as medically feasible. - complete NFPE when feasible.   NUTRITION DIAGNOSIS:   Increased nutrient needs related to acute illness, catabolic illness ([id number] infection) as evidenced by estimated needs.  GOAL:   Patient will meet greater than or equal to 90% of their needs  MONITOR:   Diet advancement, Labs, Weight trends  REASON FOR ASSESSMENT:   Malnutrition Screening Tool  ASSESSMENT:   85 y.o. male with medical history of symptomatic gallstones, R ear hearing loss, hepatic cyst, HLD, hypothyroidism, mild neurocognitive disorder, hx of prostatitis, and type 2 DM. He presented to the ED due to worsening generalized weakness, worsening dementia symptoms, and a fall the night prior. Family reported patient with new-onset incontinence of urine and stool and decreased appetite.  Able to talk with RN who shares that patient's wife recently arrived and that he has been much more alert than he has been the rest of the day now that wife is here.   SLP gowning up/donning to go into patient's room to complete bed side swallow evaluation. Patient has been NPO since admission.   He has not been seen by a [place] RD at any time in the past.  Weight on 12/16 was 181 lb. The most recently documented weight PTA was 185 lb on 12/11/20. This indicates 4 lb weight loss (2% body weight) in the past 3 months; not significant for time frame.    Labs reviewed; CBGs: 95 and 91 mg/dl, K: 3.3 mmol/l, Ca: 8.4 mg/dl.  Medications reviewed; 1000 units cholecalciferol/day, 3 mg melatonin/night, 10 mEq IV KCl x4 runs 12/19, 220 mg zinc sulfate/day.   IVF; LR @ 75 ml/hr.     NUTRITION - FOCUSED PHYSICAL EXAM:  Unable to complete at this time.   Diet Order:   Diet Order             Diet NPO time specified Except for: Sips with Meds  Diet effective now                   EDUCATION  NEEDS:   No education needs have been identified at this time  Skin:  Skin Assessment: Reviewed RN Assessment  Last BM:  12/19 (type 6 x2, one large and one small)  Height:   Ht Readings from Last 1 Encounters:  02/26/21 6\' 1"  (1.854 m)    Weight:   Wt Readings from Last 1 Encounters:  02/26/21 81.6 kg     Estimated Nutritional Needs:  Kcal:  2040-2280 kcal Protein:  105-115 grams Fluid:  >/= 2.3 L/day      Jarome Matin, MS, RD, LDN, CNSC Inpatient Clinical Dietitian RD pager # available in [place]  After hours/weekend pager # available in [facility]

## 2021-03-01 NOTE — Progress Notes (Signed)
SLP Cancellation Note  Patient Details Name: NAZARIO RUSSOM MRN: [id number] DOB: 1935/05/04   Cancelled treatment:       Reason Eval/Treat Not Completed: Patient's level of consciousness. Patient not alert and per discussion with nursing, he has not been able to accept any trials of liquids when attempted to give to patient. SLP will check back in with RN later today if/when patient alert and able to participate.   Sonia Baller, MA, CCC-SLP Speech Therapy

## 2021-03-01 NOTE — Progress Notes (Signed)
ANTICOAGULATION CONSULT NOTE - follow up  Pharmacy Consult for heparin Indication: atrial fibrillation  No Known Allergies  Patient Measurements: Height: 6\' 1"  (185.4 cm) Weight: 81.6 kg (180 lb) IBW/kg (Calculated) : 79.9 Heparin Dosing Weight: n/a. Use TBW  Vital Signs: Temp: 98.8 F (37.1 C) (12/19 0800) Temp Source: Axillary (12/19 0800) BP: 102/77 (12/19 0700) Pulse Rate: 55 (12/19 0700)  Labs: Recent Labs    02/27/21 0545 02/28/21 0359 02/28/21 1308 02/28/21 2313 03/01/21 0303 03/01/21 0911  HGB 14.0 13.8  --   --  12.4*  --   HCT 41.5 41.7  --   --  36.9*  --   PLT 144* 144*  --   --  132*  --   HEPARINUNFRC  --   --   --  0.49  --  0.40  CREATININE 1.03 0.89 0.89  --  1.18  --      Estimated Creatinine Clearance: 53.6 mL/min (by C-G formula based on SCr of 1.18 mg/dL).   Medical History: Past Medical History:  Diagnosis Date   Gallstones    asymptomatic   Hearing loss of right ear 01/23/2017   Hepatic Cysts    Hyperlipidemia    Hyperthyroidism 01/28/2013   Mild neurocognitive disorder 09/13/2016   Prostatitis    requiring a trip to the ER in [place] with a fever - 09   Type 2 diabetes mellitus without complication, without long-term current use of insulin ([place]) 03/30/2015    Medications: Patient not on anticoagulants PTA  Assessment: Pt is an 22 yoM admitted with COVID PNA. Pharmacy consulted to dose heparin for new onset afib.   Today, 03/01/21 Confirmatory heparin level is therapeutic (0.4) on 1200 units/hr CBC: Hgb WNL, Plt slightly low but stable SCr slightly increased 1.18 No bleeding or complications with infusion reported  Goal of Therapy:  Heparin level 0.3-0.7 units/ml Monitor platelets by anticoagulation protocol: Yes   Plan:  Continue heparin drip at 1200 units/hr Daily CBC and heparin level  Peggyann Juba, PharmD, BCPS Pharmacy: [phone] 03/01/2021, 10:17 AM

## 2021-03-01 NOTE — Progress Notes (Signed)
PROGRESS NOTE  Erik Lara IOX:[id number] DOB: 27-Aug-1935 DOA: 02/26/2021 PCP: Dorothyann Peng, NP  HPI/Recap of past 24 hours: Erik Lara is a 85 y.o. male with medical history significant of newly diagnosed liver body dementia, Parkinson disease, hearing impairment, hepatic cyst, hyperlipidemia, hypothyroidism, mild neurocognitive disorder, prostatitis, type II DM who presented to [facility] ED from home where he lives with his wife due to a fall, generalized weakness, and confusion x1 day.  Workup revealed Covid-19 viral infection.  CT head/cervical spine were non acute.  UA was equivocal.  He was started on antiviral Paxlovid.  Not hypoxic and not requiring oxygen supplementation.  Due to concern for aspiration pneumonia was started on Unasyn on 02/28/2021.  Hospital course complicated by agitation delirium and new onset A. fib with RVR requiring amiodarone drip and heparin drip for CHA2DS2-VASc of 3.  Transferred to stepdown unit on 02/28/2021 for drip and close monitoring.   03/01/21: Seen and examined at his bedside.  He is calmer this morning and following commands.  Assessment/Plan: Principal Problem:   2019 novel coronavirus disease (COVID-19) Active Problems:   Hyperlipidemia   Allergic rhinitis   Hyperthyroidism   Type 2 diabetes mellitus without complication, without long-term current use of insulin (HCC)   Mild neurocognitive disorder   Hypocalcemia   Mild protein malnutrition (HCC)   Normocytic anemia   COVID-19 virus infection  COVID-19 viral infection with concern for superimposed aspiration pneumonia. Started Unasyn on 02/28/2021, continue Follow sputum culture if negative DC antibiotics Continue antiviral, on Paxlovid Continue vitamin C, zinc, and add vitamin D3. Maintain O2 saturation greater than 92% Continue to trend inflammatory markers Continue incentive spirometer, flutter valve  Improving acute metabolic encephalopathy/delirium agitation in the  setting of COVID-19 viral infection. CT head nonacute Continue nonpharmacological and pharmacological delirium management in place. P.o. Haldol 0.5 mg twice daily Continue trazodone nightly Continue to regular sleep and wake cycle Continue to reorient as needed Continue fall precautions   New onset A. fib with RVR Started amiodarone drip Started heparin drip for CHA2DS2-VASc of 3 Follow 2D echo Cardiology following, appreciate assistance.  Dysphagia with concern for aspiration Speech therapist consulted to evaluate recommended mechanical soft, dysphagia 3 diet. Continue aspiration precautions.  Improving acute transaminitis, likely related to COVID-19 LFTs are downtrending.  Refractory hypokalemia Serum potassium 3.4> 3.1. Continue to replete electrolytes as indicated. Magnesium 2.2  History of Lewy body dementia/Parkinson disease Recently diagnosed with Lewy body dementia and follows up with neurology outpatient Continue home regimen Continue fall and aspiration precautions Haldol p.o. 0.5 mg twice daily   Prediabetes Carbohydrate modified diet. Hemoglobin A1c 5.7 on 02/27/2021. CBG monitoring before meals and bedtime. Avoid overcorrection, avoid hypoglycemia.     Hyperlipidemia Continue simvastatin.     Allergic rhinitis Continue cetirizine or formulary equivalent.     Hyperthyroidism No signs or symptoms of active disease.     Mild neurocognitive disorder Continue donezepil and Namenda. Supportive care.   Pseudohypoglycemia Corrected calcium for albumin 10.0.      Mild protein malnutrition (HCC) Protein supplementation.   Consider nutritional services evaluation.     Normocytic anemia Monitor H&H.  Physical debility PT OT assessed and recommended SNF Continue fall precaution TOC consulted to assist with SNF placement.  Critical care time: 65 minutes.     DVT prophylaxis:      Lovenox SQ daily. Code Status:              Full code. Family  Communication:  None at bedside at the time of the visit. Disposition Plan:              Patient is from:                        Home.             Anticipated DC to:                   Home.             Anticipated DC date:               02/28/2021.             Anticipated DC barriers:         Clinical status.           Consults called:    None. Admission status:     Inpatient  Status is: Inpatient status.  Patient requires at least 2 midnights for further evaluation and treatment of present condition.       Objective: Vitals:   03/01/21 0600 03/01/21 0700 03/01/21 0800 03/01/21 1200  BP: 111/88 102/77    Pulse: (!) 56 (!) 55    Resp: 17 14    Temp:   98.8 F (37.1 C) 97.8 F (36.6 C)  TempSrc:   Axillary Axillary  SpO2: 97% 99%    Weight:      Height:        Intake/Output Summary (Last 24 hours) at 03/01/2021 1623 Last data filed at 03/01/2021 1134 Gross per 24 hour  Intake 2164.34 ml  Output 250 ml  Net 1914.34 ml   Filed Weights   02/26/21 1034 02/26/21 1214  Weight: 82 kg 81.6 kg    Exam:  General: 85 y.o. year-old male frail-appearing in no acute distress.  He is alert and Calmer today.   Cardiovascular: Irregular rate and rhythm no rubs or gallops.   Respiratory: Mild rales at bases no wheezing noted.  Poor inspiratory effort.   Abdomen: Soft nontender normal bowel sounds present.   Musculoskeletal: No lower extremity edema bilaterally. Skin: No ulcerative lesions noted. Psychiatry: Mood is appropriate for condition and setting. Neuro: Moves all 4 extremities present.   Data Reviewed: CBC: Recent Labs  Lab 02/26/21 1136 02/27/21 0545 02/28/21 0359 03/01/21 0303  WBC 5.7 7.1 8.1 6.7  NEUTROABS 4.3 5.5 6.7 5.4  HGB 12.9* 14.0 13.8 12.4*  HCT 40.1 41.5 41.7 36.9*  MCV 95.5 91.2 90.8 91.6  PLT 138* 144* 144* 510*   Basic Metabolic Panel: Recent Labs  Lab 02/26/21 1136 02/26/21 1332 02/27/21 0545 02/28/21 0359 02/28/21 1308  03/01/21 0303  NA 135  --  135 138 135 138  K 3.5  --  3.8 3.4* 3.4* 3.3*  CL 105  --  105 105 107 109  CO2 20*  --  22 27 20* 21*  GLUCOSE 81  --  95 115* 210* 108*  BUN 18  --  19 20 22 20   CREATININE 0.66  --  1.03 0.89 0.89 1.18  CALCIUM 8.4*  --  8.5* 9.0 8.7* 8.4*  MG  --  2.1 2.3 2.2  --  2.0  PHOS  --  3.3 3.6 3.6  --  3.6   GFR: Estimated Creatinine Clearance: 53.6 mL/min (by C-G formula based on SCr of 1.18 mg/dL). Liver Function Tests: Recent Labs  Lab 02/26/21 1136 02/27/21 0545 02/28/21 0359 03/01/21 0303  AST 85* 123* 105* 64*  ALT 33 47* 52* 41  ALKPHOS 56 54 53 46  BILITOT 2.1* 2.1* 1.7* 1.5*  PROT 6.3* 6.0* 6.1* 5.3*  ALBUMIN 3.6 3.4* 3.4* 2.9*   No results for input(s): LIPASE, AMYLASE in the last 168 hours. No results for input(s): AMMONIA in the last 168 hours. Coagulation Profile: No results for input(s): INR, PROTIME in the last 168 hours. Cardiac Enzymes: No results for input(s): CKTOTAL, CKMB, CKMBINDEX, TROPONINI in the last 168 hours. BNP (last 3 results) No results for input(s): PROBNP in the last 8760 hours. HbA1C: Recent Labs    02/27/21 0545  HGBA1C 5.7*   CBG: Recent Labs  Lab 02/28/21 1151 02/28/21 1657 02/28/21 2137 03/01/21 0828 03/01/21 1124  GLUCAP 175* 89 101* 95 91   Lipid Profile: No results for input(s): CHOL, HDL, LDLCALC, TRIG, CHOLHDL, LDLDIRECT in the last 72 hours. Thyroid Function Tests: Recent Labs    02/28/21 1308  TSH 0.143*   Anemia Panel: Recent Labs    02/28/21 0359 03/01/21 0303  FERRITIN 370* 388*   Urine analysis:    Component Value Date/Time   COLORURINE YELLOW 02/26/2021 [place] 02/26/2021 1532   LABSPEC 1.030 02/26/2021 1532   PHURINE 6.0 02/26/2021 1532   GLUCOSEU NEGATIVE 02/26/2021 1532   HGBUR SMALL (A) 02/26/2021 1532   HGBUR negative 03/29/2010 0812   BILIRUBINUR NEGATIVE 02/26/2021 1532   BILIRUBINUR n 03/28/2016 1137   KETONESUR 20 (A) 02/26/2021 1532    PROTEINUR NEGATIVE 02/26/2021 1532   UROBILINOGEN 0.2 03/28/2016 1137   UROBILINOGEN 0.2 03/29/2010 0812   NITRITE NEGATIVE 02/26/2021 1532   LEUKOCYTESUR NEGATIVE 02/26/2021 1532   Sepsis Labs: @LABRCNTIP (procalcitonin:4,lacticidven:4)  ) Recent Results (from the past 240 hour(s))  Resp Panel by RT-PCR (Flu A&B, Covid) Nasopharyngeal Swab     Status: Abnormal   Collection Time: 02/26/21 11:36 AM   Specimen: Nasopharyngeal Swab; Nasopharyngeal(NP) swabs in vial transport medium  Result Value Ref Range Status   SARS Coronavirus 2 by RT PCR POSITIVE (A) NEGATIVE Final    Comment: (NOTE) SARS-CoV-2 target nucleic acids are DETECTED.  The SARS-CoV-2 RNA is generally detectable in upper respiratory specimens during the acute phase of infection. Positive results are indicative of the presence of the identified virus, but do not rule out bacterial infection or co-infection with other pathogens not detected by the test. Clinical correlation with patient history and other diagnostic information is necessary to determine patient infection status. The expected result is Negative.  Fact Sheet for Patients: EntrepreneurPulse.com.au  Fact Sheet for Healthcare Providers: IncredibleEmployment.be  This test is not yet approved or cleared by the [place] FDA and  has been authorized for detection and/or diagnosis of SARS-CoV-2 by FDA under an Emergency Use Authorization (EUA).  This EUA will remain in effect (meaning this test can be used) for the duration of  the COVID-19 declaration under Section 564(b)(1) of the A ct, 21 U.S.C. section 360bbb-3(b)(1), unless the authorization is terminated or revoked sooner.     Influenza A by PCR NEGATIVE NEGATIVE Final   Influenza B by PCR NEGATIVE NEGATIVE Final    Comment: (NOTE) The Xpert Xpress SARS-CoV-2/FLU/RSV plus assay is intended as an aid in the diagnosis of influenza from Nasopharyngeal swab  specimens and should not be used as a sole basis for treatment. Nasal washings and aspirates are unacceptable for Xpert Xpress SARS-CoV-2/FLU/RSV testing.  Fact Sheet for Patients: EntrepreneurPulse.com.au  Fact Sheet for Healthcare Providers: IncredibleEmployment.be  This test  is not yet approved or cleared by the [place] and has been authorized for detection and/or diagnosis of SARS-CoV-2 by FDA under an Emergency Use Authorization (EUA). This EUA will remain in effect (meaning this test can be used) for the duration of the COVID-19 declaration under Section 564(b)(1) of the Act, 21 U.S.C. section 360bbb-3(b)(1), unless the authorization is terminated or revoked.  Performed at [facility], [place] [address], [place], [place] [address]   MRSA Next Gen by PCR, Nasal     Status: None   Collection Time: 02/28/21  4:48 PM   Specimen: Nasal Mucosa; Nasal Swab  Result Value Ref Range Status   MRSA by PCR Next Gen NOT DETECTED NOT DETECTED Final    Comment: (NOTE) The GeneXpert MRSA Assay (FDA approved for NASAL specimens only), is one component of a comprehensive MRSA colonization surveillance program. It is not intended to diagnose MRSA infection nor to guide or monitor treatment for MRSA infections. Test performance is not FDA approved in patients less than 4 years old. Performed at [facility], [place] [address], [place], [place] [address]       Studies: No results found.  Scheduled Meds:  vitamin C  500 mg Oral Daily   chlorhexidine  15 mL Mouth Rinse BID   Chlorhexidine Gluconate Cloth  6 each Topical Daily   cholecalciferol  1,000 Units Oral Daily   donepezil  10 mg Oral QHS   haloperidol  0.5 mg Oral BID   mouth rinse  15 mL Mouth Rinse q12n4p   melatonin  3 mg Oral QHS   memantine  10 mg Oral BID   metoprolol tartrate  12.5 mg Oral BID   nirmatrelvir/ritonavir EUA  3 tablet Oral BID    traZODone  50 mg Oral QHS   zinc sulfate  220 mg Oral Daily    Continuous Infusions:  sodium chloride Stopped (03/01/21 0254)   amiodarone 30 mg/hr (03/01/21 1134)   ampicillin-sulbactam (UNASYN) IV Stopped (03/01/21 1044)   heparin 1,200 Units/hr (03/01/21 1134)   lactated ringers Stopped (03/01/21 0510)     LOS: 2 days     Kayleen Memos, MD [facility] Pager [phone]  If 7PM-7AM, please contact night-coverage [email or web address] Password [facility] 03/01/2021, 4:23 PM

## 2021-03-01 NOTE — Progress Notes (Signed)
° °  Progress Note  Patient Name: Erik Lara Date of Encounter: 03/01/2021  [facility] Cardiologist: Dr Sallyanne Kuster  Telemetry has been personally reviewed.  The patient is in sinus rhythm.  Would continue IV amiodarone for now as well as IV heparin.  Other issues per primary service.  We are following.  For questions or updates, please contact [place] Please consult [email or web address] for contact info under        Signed, Kirk Ruths, MD  03/01/2021, 11:19 AM

## 2021-03-01 NOTE — Evaluation (Addendum)
Occupational Therapy Evaluation Patient Details Name: Erik Lara MRN: [id number] DOB: 11/16/1935 Today's Date: 03/01/2021   History of Present Illness Patient is a 85 year old male who presented to the hosptial after a fall at home with weakness and confusion. patient was found to have COVID 19, a fib with RVR, acute metabolic encephalopathy, and suspected aspiration pneumonia. PMH: lewy body dementia, parkinsons, hearing impairment.   Clinical Impression   Patient is a 85 year old confused male who was admitted for above. Patient was previously living at home with family. Currently, patient is mod a x 2 for transfers with STEDY for toileting tasks. Patient was noted to have decreased functional activity tolerance, decreased endurance, decreased safety awareness, decreased standing balance/tolerance impacting participation in ADLs.       Recommendations for follow up therapy are one component of a multi-disciplinary discharge planning process, led by the attending physician.  Recommendations may be updated based on patient status, additional functional criteria and insurance authorization.   Follow Up Recommendations  Skilled nursing-short term rehab (<3 hours/day)    Assistance Recommended at Discharge Frequent or constant Supervision/Assistance  Functional Status Assessment  Patient has had a recent decline in their functional status and demonstrates the ability to make significant improvements in function in a reasonable and predictable amount of time.  Equipment Recommendations  Other (comment) (TBD)    Recommendations for Other Services       Precautions / Restrictions Precautions Precautions: Fall Restrictions Weight Bearing Restrictions: No      Mobility Bed Mobility Overal bed mobility: Needs Assistance Bed Mobility: Supine to Sit     Supine to sit: Min assist;Mod assist;+2 for safety/equipment;+2 for physical assistance     General bed mobility comments: in  recliner    Transfers Overall transfer level: Needs assistance Equipment used: 2 person hand held assist Transfers: Sit to/from Stand Sit to Stand: Mod assist;+2 physical assistance;+2 safety/equipment Stand pivot transfers: Mod assist;+2 safety/equipment;+2 physical assistance         General transfer comment: with steady      Balance Overall balance assessment: Needs assistance Sitting-balance support: Feet supported;No upper extremity supported Sitting balance-Leahy Scale: Fair     Standing balance support: Bilateral upper extremity supported Standing balance-Leahy Scale: Poor                             ADL either performed or assessed with clinical judgement   ADL Overall ADL's : Needs assistance/impaired Eating/Feeding: NPO Eating/Feeding Details (indicate cue type and reason): awaiting SLP eval Grooming: Wash/dry face;Sitting;Set up;Supervision/safety Grooming Details (indicate cue type and reason): in recliner. cues to leave lines alone Upper Body Bathing: Minimal assistance;Sitting   Lower Body Bathing: Maximal assistance;Sit to/from stand;Sitting/lateral leans   Upper Body Dressing : Moderate assistance;Sitting   Lower Body Dressing: Total assistance;Sit to/from stand;Sitting/lateral leans   Toilet Transfer: +2 for physical assistance;+2 for safety/equipment Toilet Transfer Details (indicate cue type and reason): attempted sit to stand with patient unable to tranistion into upright posture. Toileting- Clothing Manipulation and Hygiene: Total assistance;Sit to/from stand;+2 for physical assistance;+2 for safety/equipment Toileting - Clothing Manipulation Details (indicate cue type and reason): in STEDY     Functional mobility during ADLs: +2 for safety/equipment;+2 for physical assistance       Vision   Additional Comments: patient was able to see fingers in front of him and report how many were in the air. patient had hard time following  commands  for formal vision testing.     Perception     Praxis      Pertinent Vitals/Pain Pain Assessment: Faces Faces Pain Scale: No hurt Pain Intervention(s): Monitored during session     Hand Dominance Right   Extremity/Trunk Assessment Upper Extremity Assessment Upper Extremity Assessment: Defer to OT evaluation RUE Deficits / Details: patient able to FF and ABDuct to about 70 degrees with AAROM. patient strength WNL. no pain noted with movement LUE Deficits / Details: FF and abduction to about 90degrees no pain noted with movement.strenght WNL   Lower Extremity Assessment Lower Extremity Assessment: Generalized weakness   Cervical / Trunk Assessment Cervical / Trunk Assessment: Normal   Communication Communication Communication: HOH   Cognition Arousal/Alertness:  (sleepy initially, able to arouse with mulit-modal stim) Behavior During Therapy: Flat affect Overall Cognitive Status: No family/caregiver present to determine baseline cognitive functioning                                 General Comments: patient is noted to have dementia at baseline in chart. patient is oriented to self. able to follow one step commands with incr time durin PT session     General Comments   142/52 mmhg sitting in recliner 98% on RA 70 bpm    Exercises     Shoulder Instructions      Home Living Family/patient expects to be discharged to:: Private residence Living Arrangements: Alone (per RN pt lives alone)                               Additional Comments: patient is plesantly confused at this time unable to offer PLOF. no family in room to provide PLOF.      Prior Functioning/Environment                          OT Problem List: Decreased safety awareness;Impaired balance (sitting and/or standing);Decreased activity tolerance;Decreased knowledge of use of DME or AE;Decreased knowledge of precautions      OT Treatment/Interventions:  Self-care/ADL training;Therapeutic exercise;Patient/family education;Balance training;Neuromuscular education;DME and/or AE instruction    OT Goals(Current goals can be found in the care plan section) Acute Rehab OT Goals Patient Stated Goal: none stated OT Goal Formulation: Patient unable to participate in goal setting Time For Goal Achievement: 03/15/21 Potential to Achieve Goals: Fair  OT Frequency:     Barriers to D/C:    unclear ammount of assistance at home       Co-evaluation              AM-PAC OT "6 Clicks" Daily Activity     Outcome Measure Help from another person eating meals?: Total (npo) Help from another person taking care of personal grooming?: A Little Help from another person toileting, which includes using toliet, bedpan, or urinal?: A Lot Help from another person bathing (including washing, rinsing, drying)?: A Lot Help from another person to put on and taking off regular upper body clothing?: A Lot Help from another person to put on and taking off regular lower body clothing?: A Lot 6 Click Score: 12   End of Session Equipment Utilized During Treatment: Gait belt;Other (comment) (STEDY) Nurse Communication: Other (comment) (cleared pt to participate)  Activity Tolerance: Patient tolerated treatment well Patient left: in chair;with call bell/phone within reach;with chair alarm set  OT Visit  Diagnosis: Unsteadiness on feet (R26.81);Muscle weakness (generalized) (M62.81);Other symptoms and signs involving cognitive function                Time: 5750-5183 OT Time Calculation (min): 26 min Charges:  OT General Charges $OT Visit: 1 Visit OT Evaluation $OT Eval Moderate Complexity: 1 Mod OT Treatments $Self Care/Home Management : 8-22 mins  Jackelyn Poling OTR/L, MS Acute Rehabilitation Department Office# [phone] Pager# [phone]   [name] 03/01/2021, 12:28 PM

## 2021-03-01 NOTE — Evaluation (Signed)
Clinical/Bedside Swallow Evaluation Patient Details  Name: Erik Lara MRN: [id number] Date of Birth: March 09, 1936  Today's Date: 03/01/2021 Time: SLP Start Time (ACUTE ONLY): 65 SLP Stop Time (ACUTE ONLY): 6073 SLP Time Calculation (min) (ACUTE ONLY): 25 min  Past Medical History:  Past Medical History:  Diagnosis Date   Gallstones    asymptomatic   Hearing loss of right ear 01/23/2017   Hepatic Cysts    Hyperlipidemia    Hyperthyroidism 01/28/2013   Mild neurocognitive disorder 09/13/2016   Prostatitis    requiring a trip to the ER in [place] with a fever - 09   Type 2 diabetes mellitus without complication, without long-term current use of insulin ([place]) 03/30/2015   Past Surgical History:  Past Surgical History:  Procedure Laterality Date   HERNIA REPAIR     Childhood   HPI:  Patient is a 85 year old male who presented to the hosptial after a fall at home with weakness and confusion. patient was found to have COVID 19, a fib with RVR, acute metabolic encephalopathy, and suspected aspiration pneumonia. PMH: lewy body dementia, parkinsons, hearing impairment.    Assessment / Plan / Recommendation  Clinical Impression  Patient presents with clinical s/s of dysphagia as per this bedside swallow evaluation. Patient sitting upright in recliner with wife present in room when SLP arrived. Although he kept his eyes mostly closed, he was alert, verbally responding and following basic directions. He tolerated consecutive straw sips of thin liquids (water) via straw sips without overt s/s aspiration or penetration. He declined puree solids but did consume saltine cracker. After consuming cracker and having sip of water, he exhibited a mildly delayed throat clear and cough, with expectoration of mostly clear secretions that patient spit out into emesis basin. Of note, patient's wife reports that he "eats like a teenager" and likes mainly cheeseburgers, etc. but not vegetables. SLP is  recommending start patient on Dys 3 solids, thin liquids and will follow patient for toleration and ability to upgrade solid texture consistencies. SLP Visit Diagnosis: Dysphagia, unspecified (R13.10)    Aspiration Risk  Mild aspiration risk    Diet Recommendation Dysphagia 3 (Mech soft);Thin liquid   Liquid Administration via: Cup;Straw Medication Administration: Other (Comment) (whole with liquids or puree) Supervision: Staff to assist with self feeding;Full supervision/cueing for compensatory strategies Compensations: Minimize environmental distractions;Slow rate;Small sips/bites Postural Changes: Seated upright at 90 degrees    Other  Recommendations Oral Care Recommendations: Oral care BID;Staff/trained caregiver to provide oral care    Recommendations for follow up therapy are one component of a multi-disciplinary discharge planning process, led by the attending physician.  Recommendations may be updated based on patient status, additional functional criteria and insurance authorization.  Follow up Recommendations Other (comment) (TBD)      Assistance Recommended at Discharge Frequent or constant Supervision/Assistance  Functional Status Assessment Patient has had a recent decline in their functional status and demonstrates the ability to make significant improvements in function in a reasonable and predictable amount of time.  Frequency and Duration min 1 x/week  1 week       Prognosis Prognosis for Safe Diet Advancement: Good Barriers to Reach Goals: Cognitive deficits      Swallow Study   General Date of Onset: 02/26/21 HPI: Patient is a 85 year old male who presented to the hosptial after a fall at home with weakness and confusion. patient was found to have COVID 19, a fib with RVR, acute metabolic encephalopathy, and suspected  aspiration pneumonia. PMH: lewy body dementia, parkinsons, hearing impairment. Type of Study: Bedside Swallow Evaluation Previous Swallow  Assessment: none found Diet Prior to this Study: NPO Temperature Spikes Noted: No Respiratory Status: Room air History of Recent Intubation: No Behavior/Cognition: Alert;Cooperative;Impulsive Oral Cavity Assessment: Other (comment) (mild amount of oral secretions appeared to come mainly from lips) Oral Care Completed by SLP: Yes Oral Cavity - Dentition: Adequate natural dentition Self-Feeding Abilities: Total assist Patient Positioning: Upright in chair Baseline Vocal Quality: Normal Volitional Cough: Cognitively unable to elicit Volitional Swallow: Unable to elicit    Oral/Motor/Sensory Function Overall Oral Motor/Sensory Function: Within functional limits   Ice Chips     Thin Liquid Thin Liquid: Within functional limits Presentation: Straw    Nectar Thick     Honey Thick     Puree Puree: Not tested Other Comments: patient declined   Solid     Solid: Impaired Pharyngeal Phase Impairments: Throat Clearing - Delayed Other Comments: patient with delayed throat clearing and cough, expectorating some mainly clear liquid secretions      Sonia Baller, MA, CCC-SLP Speech Therapy

## 2021-03-01 NOTE — Evaluation (Signed)
Physical Therapy Evaluation Patient Details Name: Erik Lara MRN: [id number] DOB: 06/01/1935 Today's Date: 03/01/2021  History of Present Illness  Patient is a 85 year old male who presented to the hosptial after a fall at home with weakness and confusion. patient was found to have COVID 19, a fib with RVR, acute metabolic encephalopathy, and suspected aspiration pneumonia. PMH: lewy body dementia, parkinsons, hearing impairment.  Clinical Impression  Pt admitted with above diagnosis.  Pt sleepy however awakens easily with multi-modal stimuli. Cooperative, follows most simple commands for PT. Speech nonsensical, difficult to understand however able to answer a few yes/no questions end of session clearly.  Overall +2 mod assist for bed mobility and transfers. Recommend SNF. Noted palliative consult pending.  Pt currently with functional limitations due to the deficits listed below (see PT Problem List). Pt will benefit from skilled PT to increase their independence and safety with mobility to allow discharge to the venue listed below.          Recommendations for follow up therapy are one component of a multi-disciplinary discharge planning process, led by the attending physician.  Recommendations may be updated based on patient status, additional functional criteria and insurance authorization.  Follow Up Recommendations Skilled nursing-short term rehab (<3 hours/day)    Assistance Recommended at Discharge Frequent or constant Supervision/Assistance  Functional Status Assessment Patient has had a recent decline in their functional status and demonstrates the ability to make significant improvements in function in a reasonable and predictable amount of time.  Equipment Recommendations  None recommended by PT    Recommendations for Other Services       Precautions / Restrictions Precautions Precautions: Fall Restrictions Weight Bearing Restrictions: No      Mobility  Bed  Mobility Overal bed mobility: Needs Assistance Bed Mobility: Supine to Sit     Supine to sit: Min assist;Mod assist;+2 for safety/equipment;+2 for physical assistance     General bed mobility comments: in recliner    Transfers Overall transfer level: Needs assistance Equipment used: 2 person hand held assist Transfers: Sit to/from Stand Sit to Stand: Mod assist;+2 physical assistance;+2 safety/equipment Stand pivot transfers: Mod assist;+2 safety/equipment;+2 physical assistance         General transfer comment: with steady    Ambulation/Gait                  Stairs            Wheelchair Mobility    Modified Rankin (Stroke Patients Only)       Balance Overall balance assessment: Needs assistance Sitting-balance support: Feet supported;No upper extremity supported Sitting balance-Leahy Scale: Fair     Standing balance support: Bilateral upper extremity supported Standing balance-Leahy Scale: Poor                               Pertinent Vitals/Pain Pain Assessment: Faces Faces Pain Scale: No hurt Pain Intervention(s): Monitored during session    Home Living Family/patient expects to be discharged to:: Private residence Living Arrangements: Alone (per RN pt lives alone)                 Additional Comments: patient is plesantly confused at this time unable to offer PLOF. no family in room to provide PLOF.    Prior Function                       Hand Dominance  Dominant Hand: Right    Extremity/Trunk Assessment   Upper Extremity Assessment Upper Extremity Assessment: Defer to OT evaluation RUE Deficits / Details: patient able to FF and ABDuct to about 70 degrees with AAROM. patient strength WNL. no pain noted with movement LUE Deficits / Details: FF and abduction to about 90degrees no pain noted with movement.strenght WNL    Lower Extremity Assessment Lower Extremity Assessment: Generalized weakness     Cervical / Trunk Assessment Cervical / Trunk Assessment: Normal  Communication   Communication: HOH  Cognition Arousal/Alertness:  (sleepy initially, able to arouse with mulit-modal stim) Behavior During Therapy: Flat affect Overall Cognitive Status: No family/caregiver present to determine baseline cognitive functioning                                 General Comments: patient is noted to have dementia at baseline in chart. patient is oriented to self. able to follow one step commands with incr time durin PT session        General Comments      Exercises     Assessment/Plan    PT Assessment Patient needs continued PT services  PT Problem List Decreased strength;Decreased mobility;Decreased activity tolerance;Decreased balance;Decreased knowledge of use of DME;Decreased cognition;Cardiopulmonary status limiting activity       PT Treatment Interventions DME instruction;Therapeutic activities;Gait training;Therapeutic exercise;Patient/family education;Functional mobility training;Balance training    PT Goals (Current goals can be found in the Care Plan section)  Acute Rehab PT Goals Patient Stated Goal: pt unable to state PT Goal Formulation: Patient unable to participate in goal setting Time For Goal Achievement: 03/15/21    Frequency Min 2X/week   Barriers to discharge        Co-evaluation               AM-PAC PT "6 Clicks" Mobility  Outcome Measure Help needed turning from your back to your side while in a flat bed without using bedrails?: Total Help needed moving from lying on your back to sitting on the side of a flat bed without using bedrails?: Total Help needed moving to and from a bed to a chair (including a wheelchair)?: Total Help needed standing up from a chair using your arms (e.g., wheelchair or bedside chair)?: Total Help needed to walk in hospital room?: Total Help needed climbing 3-5 steps with a railing? : Total 6 Click Score:  6    End of Session   Activity Tolerance: Patient limited by fatigue Patient left: in chair;with call bell/phone within reach;with chair alarm set Nurse Communication: Mobility status PT Visit Diagnosis: Other abnormalities of gait and mobility (R26.89);Muscle weakness (generalized) (M62.81);Difficulty in walking, not elsewhere classified (R26.2)    Time: 1610-9604 PT Time Calculation (min) (ACUTE ONLY): 30 min   Charges:   PT Evaluation $PT Eval Low Complexity: 1 Low PT Treatments $Therapeutic Activity: 8-22 mins        Baxter Flattery, PT  Acute Rehab Dept ([place]) [phone] Pager [phone]  03/01/2021   [facility] 03/01/2021, 12:26 PM

## 2021-03-02 DIAGNOSIS — U071 COVID-19: Secondary | ICD-10-CM | POA: Diagnosis not present

## 2021-03-02 DIAGNOSIS — I480 Paroxysmal atrial fibrillation: Secondary | ICD-10-CM | POA: Diagnosis not present

## 2021-03-02 LAB — COMPREHENSIVE METABOLIC PANEL
ALT: 35 U/L (ref 0–44)
AST: 45 U/L — ABNORMAL HIGH (ref 15–41)
Albumin: 2.5 g/dL — ABNORMAL LOW (ref 3.5–5.0)
Alkaline Phosphatase: 42 U/L (ref 38–126)
Anion gap: 9 (ref 5–15)
BUN: 26 mg/dL — ABNORMAL HIGH (ref 8–23)
CO2: 21 mmol/L — ABNORMAL LOW (ref 22–32)
Calcium: 8.7 mg/dL — ABNORMAL LOW (ref 8.9–10.3)
Chloride: 110 mmol/L (ref 98–111)
Creatinine, Ser: 1.72 mg/dL — ABNORMAL HIGH (ref 0.61–1.24)
GFR, Estimated: 39 mL/min — ABNORMAL LOW (ref >60)
Glucose, Bld: 96 mg/dL (ref 70–99)
Potassium: 3.4 mmol/L — ABNORMAL LOW (ref 3.5–5.1)
Sodium: 140 mmol/L (ref 135–145)
Total Bilirubin: 1.1 mg/dL (ref 0.3–1.2)
Total Protein: 5.1 g/dL — ABNORMAL LOW (ref 6.5–8.1)

## 2021-03-02 LAB — CBC WITH DIFFERENTIAL/PLATELET
Abs Immature Granulocytes: 0.03 10*3/uL (ref 0.00–0.07)
Basophils Absolute: 0 10*3/uL (ref 0.0–0.1)
Basophils Relative: 0 %
Eosinophils Absolute: 0 10*3/uL (ref 0.0–0.5)
Eosinophils Relative: 0 %
HCT: 38.4 % — ABNORMAL LOW (ref 39.0–52.0)
Hemoglobin: 12.4 g/dL — ABNORMAL LOW (ref 13.0–17.0)
Immature Granulocytes: 1 %
Lymphocytes Relative: 13 %
Lymphs Abs: 0.8 10*3/uL (ref 0.7–4.0)
MCH: 30.2 pg (ref 26.0–34.0)
MCHC: 32.3 g/dL (ref 30.0–36.0)
MCV: 93.4 fL (ref 80.0–100.0)
Monocytes Absolute: 0.5 10*3/uL (ref 0.1–1.0)
Monocytes Relative: 9 %
Neutro Abs: 4.5 10*3/uL (ref 1.7–7.7)
Neutrophils Relative %: 77 %
Platelets: 116 10*3/uL — ABNORMAL LOW (ref 150–400)
RBC: 4.11 MIL/uL — ABNORMAL LOW (ref 4.22–5.81)
RDW: 13.7 % (ref 11.5–15.5)
WBC: 5.8 10*3/uL (ref 4.0–10.5)
nRBC: 0 % (ref 0.0–0.2)

## 2021-03-02 LAB — MAGNESIUM: Magnesium: 2 mg/dL (ref 1.7–2.4)

## 2021-03-02 LAB — HEPARIN LEVEL (UNFRACTIONATED)
Heparin Unfractionated: 0.2 IU/mL — ABNORMAL LOW (ref 0.30–0.70)
Heparin Unfractionated: 0.41 IU/mL (ref 0.30–0.70)
Heparin Unfractionated: 0.37 IU/mL (ref 0.30–0.70)

## 2021-03-02 LAB — GLUCOSE, CAPILLARY
Glucose-Capillary: 81 mg/dL (ref 70–99)
Glucose-Capillary: 139 mg/dL — ABNORMAL HIGH (ref 70–99)
Glucose-Capillary: 131 mg/dL — ABNORMAL HIGH (ref 70–99)
Glucose-Capillary: 113 mg/dL — ABNORMAL HIGH (ref 70–99)

## 2021-03-02 LAB — D-DIMER, QUANTITATIVE: D-Dimer, Quant: 0.67 ug/mL-FEU — ABNORMAL HIGH (ref 0.00–0.50)

## 2021-03-02 LAB — BASIC METABOLIC PANEL
Anion gap: 7 (ref 5–15)
BUN: 24 mg/dL — ABNORMAL HIGH (ref 8–23)
CO2: 20 mmol/L — ABNORMAL LOW (ref 22–32)
Calcium: 8.6 mg/dL — ABNORMAL LOW (ref 8.9–10.3)
Chloride: 111 mmol/L (ref 98–111)
Creatinine, Ser: 1.36 mg/dL — ABNORMAL HIGH (ref 0.61–1.24)
GFR, Estimated: 52 mL/min — ABNORMAL LOW (ref >60)
Glucose, Bld: 150 mg/dL — ABNORMAL HIGH (ref 70–99)
Potassium: 3.8 mmol/L (ref 3.5–5.1)
Sodium: 138 mmol/L (ref 135–145)

## 2021-03-02 LAB — C-REACTIVE PROTEIN: CRP: 8.9 mg/dL — ABNORMAL HIGH (ref <1.0)

## 2021-03-02 LAB — PHOSPHORUS: Phosphorus: 3.8 mg/dL (ref 2.5–4.6)

## 2021-03-02 LAB — FERRITIN: Ferritin: 383 ng/mL — ABNORMAL HIGH (ref 24–336)

## 2021-03-02 MED ORDER — AMOXICILLIN-POT CLAVULANATE 875-125 MG PO TABS: 1.0000 | ORAL_TABLET | Freq: Two times a day (BID) | ORAL | Status: DC

## 2021-03-02 MED ORDER — POTASSIUM CHLORIDE CRYS ER 20 MEQ PO TBCR
40.0000 meq | EXTENDED_RELEASE_TABLET | Freq: Two times a day (BID) | ORAL | Status: DC
  Filled 2021-03-02: qty 2

## 2021-03-02 MED ORDER — LACTATED RINGERS IV SOLN: INTRAVENOUS | Status: AC

## 2021-03-02 MED ORDER — POTASSIUM CHLORIDE 10 MEQ/100ML IV SOLN
10.0000 meq | INTRAVENOUS | Status: AC
  Administered 2021-03-02 (×5): 10 meq via INTRAVENOUS
  Filled 2021-03-02 (×5): qty 100

## 2021-03-02 MED ORDER — DONEPEZIL HCL 10 MG PO TABS
10.0000 mg | ORAL_TABLET | Freq: Every day | ORAL | Status: DC
  Administered 2021-03-03 – 2021-03-12 (×11): 10 mg via ORAL
  Filled 2021-03-02 (×11): qty 1

## 2021-03-02 MED ORDER — LIP MEDEX EX OINT
TOPICAL_OINTMENT | CUTANEOUS | Status: DC | PRN
  Filled 2021-03-02: qty 7

## 2021-03-02 MED ORDER — AMOXICILLIN-POT CLAVULANATE 875-125 MG PO TABS
1.0000 | ORAL_TABLET | Freq: Two times a day (BID) | ORAL | Status: AC
  Administered 2021-03-02 – 2021-03-05 (×6): 1 via ORAL
  Filled 2021-03-02 (×6): qty 1

## 2021-03-02 NOTE — Progress Notes (Signed)
ANTICOAGULATION CONSULT NOTE - Follow Up Consult  Pharmacy Consult for Heparin Indication: atrial fibrillation  No Known Allergies  Patient Measurements: Height: 6\' 1"  (185.4 cm) Weight: 81.6 kg (180 lb) IBW/kg (Calculated) : 79.9 Heparin Dosing Weight: total weight  Vital Signs: Temp: 98 F (36.7 C) (12/20 1200) Temp Source: Axillary (12/20 1200) BP: 162/88 (12/20 1300) Pulse Rate: 74 (12/20 1300)  Labs: Recent Labs    02/28/21 0359 02/28/21 1308 03/01/21 0303 03/01/21 0911 03/02/21 0247 03/02/21 1312  HGB 13.8  --  12.4*  --  12.4*  --   HCT 41.7  --  36.9*  --  38.4*  --   PLT 144*  --  132*  --  116*  --   HEPARINUNFRC  --    < >  --  0.40 0.20* 0.41  CREATININE 0.89   < > 1.18  --  1.72* 1.36*   < > = values in this interval not displayed.    Estimated Creatinine Clearance: 46.5 mL/min (A) (by C-G formula based on SCr of 1.36 mg/dL (H)).   Medications:  Scheduled:   vitamin C  500 mg Oral Daily   chlorhexidine  15 mL Mouth Rinse BID   Chlorhexidine Gluconate Cloth  6 each Topical Daily   cholecalciferol  1,000 Units Oral Daily   donepezil  10 mg Oral QHS   haloperidol  0.5 mg Oral BID   mouth rinse  15 mL Mouth Rinse q12n4p   melatonin  3 mg Oral QHS   memantine  10 mg Oral BID   nirmatrelvir/ritonavir EUA  3 tablet Oral BID   traZODone  50 mg Oral QHS   zinc sulfate  220 mg Oral Daily   Infusions:   sodium chloride Stopped (03/02/21 0425)   amiodarone 30 mg/hr (03/02/21 0700)   ampicillin-sulbactam (UNASYN) IV Stopped (03/02/21 0933)   heparin 1,350 Units/hr (03/02/21 0700)   PRN: sodium chloride, acetaminophen **OR** acetaminophen, albuterol, guaiFENesin-dextromethorphan, haloperidol lactate, lip balm, metoprolol tartrate, ondansetron **OR** ondansetron (ZOFRAN) IV  Assessment:  46 yoM admitted with COVID PNA. Pharmacy consulted to dose heparin for new onset afib with RVR. CHA2DS2-VASc of 3.  No anticoagulations PTA.  Today, 03/02/2021 Heparin  level therapeutic (0.41) on 1250 units/hr CBC: Hgb 12.4 (stable), Plts trending down 116 SCr elevated but improved from this AM, 1.72 >> 1.36 No bleeding issues reported  Goal of Therapy:  Heparin level 0.3-0.7 units/ml Monitor platelets by anticoagulation protocol: Yes   Plan:  Continue heparin infusion at 1350 units/hr Recheck heparin level in 8hrs to confirm therapeutic Daily heparin level and CBC Monitor for signs/symptoms of bleeding  Peggyann Juba, PharmD, BCPS Pharmacy: [phone] 03/02/2021,2:10 PM

## 2021-03-02 NOTE — Progress Notes (Addendum)
° °  RN info noted.   Pt on metop 12.5 mg bid, but has only gotten 1 dose, on 12/18. Otherwise, has been held for bradycardia.  No doses of metop 2.5 mg IV given  Telemetry reviewed, pt in SR, sinus brady, w/ rare PVCs. HR high 40s while asleep.  Discuss w/ MD if we should continue po metop or just leave him on prn IV metop.  Rosaria Ferries, PA-C 03/02/2021 8:36 AM As above, telemetry reviewed.  Patient is now in sinus rhythm to sinus bradycardia.  We will hold metoprolol due to mild bradycardia.  We will continue amiodarone and IV heparin for now.  Can likely discontinue amiodarone in the next 24 to 48 hours and follow.  Atrial fibrillation likely secondary to the stress of ongoing COVID infection.  He will likely need apixaban at least in the short-term.  We are following. Kirk Ruths, MD

## 2021-03-02 NOTE — Progress Notes (Signed)
PROGRESS NOTE  Erik Lara HYW:[id number] DOB: 01/24/1936 DOA: 02/26/2021 PCP: Dorothyann Peng, NP  HPI/Recap of past 24 hours: Erik Lara is a 85 y.o. male with medical history significant of newly diagnosed Lewy body dementia, Parkinson disease, hearing impairment, hepatic cyst, hyperlipidemia, hypothyroidism, mild neurocognitive disorder, prostatitis, type II DM who presented to [facility] ED from home where he lives with his wife due to a fall, generalized weakness, and confusion x1 day.  Workup revealed Covid-19 viral infection.  CT head/cervical spine were non acute.  UA was equivocal.  He was started on antiviral Paxlovid.  Not hypoxic and not requiring oxygen supplementation.  Due to concern for aspiration pneumonia was started on Unasyn on 02/28/2021, switched to Augmentin on 03/02/21.  Hospital course complicated by agitation delirium and new onset A. fib with RVR requiring amiodarone drip and heparin drip for CHA2DS2-VASc of 3.  Transferred to stepdown unit on 02/28/2021 for drip and close monitoring.   03/02/21: Seen and examined at bedside.  No acute events overnight.  He is calmer today.  He has no new complaints.  Assessment/Plan: Principal Problem:   2019 novel coronavirus disease (COVID-19) Active Problems:   Hyperlipidemia   Allergic rhinitis   Hyperthyroidism   Type 2 diabetes mellitus without complication, without long-term current use of insulin (HCC)   Mild neurocognitive disorder   Hypocalcemia   Mild protein malnutrition (HCC)   Normocytic anemia   COVID-19 virus infection  COVID-19 viral infection with concern for superimposed aspiration pneumonia. Started Unasyn on 02/28/2021, DC'd 03/02/21 Augmentin started on 03/02/21 x3 days. Continue antiviral, on Paxlovid Continue vitamin C, zinc, and add vitamin D3. Continue to Maintain O2 saturation greater than 92% Continue to trend inflammatory markers Continue incentive spirometer, flutter valve  Resolved  acute metabolic encephalopathy/delirium agitation in the setting of COVID-19 viral infection. CT head nonacute Continue nonpharmacological and pharmacological delirium management in place. P.o. Haldol 0.5 mg twice daily or Seroquel 12.5 mg nightly if no interaction with antiviral Continue trazodone nightly Continue to regular sleep and wake cycle Continue to reorient as needed Continue fall precautions   New onset A. fib with RVR Started amiodarone drip on 03/01/2021 Started heparin drip for CHA2DS2-VASc of 3 on 03/01/2021 Follow 2D echo Cardiology following, appreciate assistance. Cardiology recommended amiodarone drip in the next 24 to 48 hours, continue heparin drip for CVA prevention  Dysphagia with concern for aspiration Speech therapist consulted to evaluate recommended mechanical soft, dysphagia 3 diet. Continue aspiration precautions.  Improving acute transaminitis, likely related to COVID-19 LFTs are downtrending.  Refractory hypokalemia Serum potassium 3.4> 3.1. Repleted intravenously. Magnesium 2.2  History of Lewy body dementia/Parkinson disease Recently diagnosed with Lewy body dementia and follows up with neurology outpatient Continue home regimen Continue fall and aspiration precautions Haldol p.o. 0.5 mg twice daily   Prediabetes Carbohydrate modified diet. Hemoglobin A1c 5.7 on 02/27/2021. CBG monitoring before meals and bedtime. Continue to avoid overcorrection, avoid hypoglycemia.     Hyperlipidemia Continue simvastatin.     Allergic rhinitis Continue cetirizine or formulary equivalent.     Hyperthyroidism No signs or symptoms of active disease.     Mild neurocognitive disorder Continue donezepil and Namenda. Supportive care.   Pseudohypoglycemia Corrected calcium for albumin 10.0.      Mild protein malnutrition (HCC) Protein supplementation.   Consider nutritional services evaluation.     Normocytic anemia Monitor H&H.  Physical  debility PT OT assessed and recommended SNF Continue fall precaution TOC consulted to assist with SNF placement.  DVT prophylaxis:      Lovenox SQ daily. Code Status:              Full code. Family Communication:      None at bedside at the time of the visit. Disposition Plan:              Patient is from:                        Home.             Anticipated DC to:                   Home.             Anticipated DC date:               02/28/2021.             Anticipated DC barriers:         Clinical status.           Consults called:    None. Admission status:     Inpatient  Status is: Inpatient status.  Patient requires at least 2 midnights for further evaluation and treatment of present condition.       Objective: Vitals:   03/02/21 1200 03/02/21 1300 03/02/21 1309 03/02/21 1400  BP: (!) 158/82 (!) 162/88  107/77  Pulse: 69 74 76 67  Resp: 19 16 18 15   Temp: 98 F (36.7 C)     TempSrc: Axillary     SpO2: 99% 99% 100% 98%  Weight:      Height:        Intake/Output Summary (Last 24 hours) at 03/02/2021 1526 Last data filed at 03/02/2021 1436 Gross per 24 hour  Intake 2743.14 ml  Output 150 ml  Net 2593.14 ml   Filed Weights   02/26/21 1034 02/26/21 1214  Weight: 82 kg 81.6 kg    Exam:  General: 85 y.o. year-old male frail-appearing no acute distress.  He is alert and pleasant.   Cardiovascular: Regular rate and rhythm no rubs or gallops.   Respiratory: Clear to auscultation with no wheezes or rales.   Abdomen: Soft nontender normal bowel sounds present.   Musculoskeletal: No lower extremity edema bilaterally. Skin: No ulcerative lesions noted.   Psychiatry: Mood is appropriate for condition and setting.   Neuro: Moves all 4 extremities.  Data Reviewed: CBC: Recent Labs  Lab 02/26/21 1136 02/27/21 0545 02/28/21 0359 03/01/21 0303 03/02/21 0247  WBC 5.7 7.1 8.1 6.7 5.8  NEUTROABS 4.3 5.5 6.7 5.4 4.5  HGB 12.9* 14.0 13.8 12.4* 12.4*  HCT  40.1 41.5 41.7 36.9* 38.4*  MCV 95.5 91.2 90.8 91.6 93.4  PLT 138* 144* 144* 132* 182*   Basic Metabolic Panel: Recent Labs  Lab 02/26/21 1332 02/27/21 0545 02/28/21 0359 02/28/21 1308 03/01/21 0303 03/02/21 0247 03/02/21 1312  NA  --  135 138 135 138 140 138  K  --  3.8 3.4* 3.4* 3.3* 3.4* 3.8  CL  --  105 105 107 109 110 111  CO2  --  22 27 20* 21* 21* 20*  GLUCOSE  --  95 115* 210* 108* 96 150*  BUN  --  19 20 22 20  26* 24*  CREATININE  --  1.03 0.89 0.89 1.18 1.72* 1.36*  CALCIUM  --  8.5* 9.0 8.7* 8.4* 8.7* 8.6*  MG 2.1 2.3 2.2  --  2.0 2.0  --  PHOS 3.3 3.6 3.6  --  3.6 3.8  --    GFR: Estimated Creatinine Clearance: 46.5 mL/min (A) (by C-G formula based on SCr of 1.36 mg/dL (H)). Liver Function Tests: Recent Labs  Lab 02/26/21 1136 02/27/21 0545 02/28/21 0359 03/01/21 0303 03/02/21 0247  AST 85* 123* 105* 64* 45*  ALT 33 47* 52* 41 35  ALKPHOS 56 54 53 46 42  BILITOT 2.1* 2.1* 1.7* 1.5* 1.1  PROT 6.3* 6.0* 6.1* 5.3* 5.1*  ALBUMIN 3.6 3.4* 3.4* 2.9* 2.5*   No results for input(s): LIPASE, AMYLASE in the last 168 hours. No results for input(s): AMMONIA in the last 168 hours. Coagulation Profile: No results for input(s): INR, PROTIME in the last 168 hours. Cardiac Enzymes: No results for input(s): CKTOTAL, CKMB, CKMBINDEX, TROPONINI in the last 168 hours. BNP (last 3 results) No results for input(s): PROBNP in the last 8760 hours. HbA1C: No results for input(s): HGBA1C in the last 72 hours.  CBG: Recent Labs  Lab 03/01/21 0828 03/01/21 1124 03/01/21 1651 03/02/21 0741 03/02/21 1123  GLUCAP 95 91 78 81 139*   Lipid Profile: No results for input(s): CHOL, HDL, LDLCALC, TRIG, CHOLHDL, LDLDIRECT in the last 72 hours. Thyroid Function Tests: Recent Labs    02/28/21 1308  TSH 0.143*   Anemia Panel: Recent Labs    03/01/21 0303 03/02/21 0247  FERRITIN 388* 383*   Urine analysis:    Component Value Date/Time   COLORURINE YELLOW 02/26/2021  [place] 02/26/2021 1532   LABSPEC 1.030 02/26/2021 1532   PHURINE 6.0 02/26/2021 1532   GLUCOSEU NEGATIVE 02/26/2021 1532   HGBUR SMALL (A) 02/26/2021 1532   HGBUR negative 03/29/2010 0812   BILIRUBINUR NEGATIVE 02/26/2021 1532   BILIRUBINUR n 03/28/2016 1137   KETONESUR 20 (A) 02/26/2021 1532   PROTEINUR NEGATIVE 02/26/2021 1532   UROBILINOGEN 0.2 03/28/2016 1137   UROBILINOGEN 0.2 03/29/2010 0812   NITRITE NEGATIVE 02/26/2021 1532   LEUKOCYTESUR NEGATIVE 02/26/2021 1532   Sepsis Labs: @LABRCNTIP (procalcitonin:4,lacticidven:4)  ) Recent Results (from the past 240 hour(s))  Resp Panel by RT-PCR (Flu A&B, Covid) Nasopharyngeal Swab     Status: Abnormal   Collection Time: 02/26/21 11:36 AM   Specimen: Nasopharyngeal Swab; Nasopharyngeal(NP) swabs in vial transport medium  Result Value Ref Range Status   SARS Coronavirus 2 by RT PCR POSITIVE (A) NEGATIVE Final    Comment: (NOTE) SARS-CoV-2 target nucleic acids are DETECTED.  The SARS-CoV-2 RNA is generally detectable in upper respiratory specimens during the acute phase of infection. Positive results are indicative of the presence of the identified virus, but do not rule out bacterial infection or co-infection with other pathogens not detected by the test. Clinical correlation with patient history and other diagnostic information is necessary to determine patient infection status. The expected result is Negative.  Fact Sheet for Patients: EntrepreneurPulse.com.au  Fact Sheet for Healthcare Providers: IncredibleEmployment.be  This test is not yet approved or cleared by the [place] FDA and  has been authorized for detection and/or diagnosis of SARS-CoV-2 by FDA under an Emergency Use Authorization (EUA).  This EUA will remain in effect (meaning this test can be used) for the duration of  the COVID-19 declaration under Section 564(b)(1) of the A ct, 21 U.S.C. section  360bbb-3(b)(1), unless the authorization is terminated or revoked sooner.     Influenza A by PCR NEGATIVE NEGATIVE Final   Influenza B by PCR NEGATIVE NEGATIVE Final    Comment: (NOTE) The Xpert Xpress SARS-CoV-2/FLU/RSV  plus assay is intended as an aid in the diagnosis of influenza from Nasopharyngeal swab specimens and should not be used as a sole basis for treatment. Nasal washings and aspirates are unacceptable for Xpert Xpress SARS-CoV-2/FLU/RSV testing.  Fact Sheet for Patients: EntrepreneurPulse.com.au  Fact Sheet for Healthcare Providers: IncredibleEmployment.be  This test is not yet approved or cleared by the [place] FDA and has been authorized for detection and/or diagnosis of SARS-CoV-2 by FDA under an Emergency Use Authorization (EUA). This EUA will remain in effect (meaning this test can be used) for the duration of the COVID-19 declaration under Section 564(b)(1) of the Act, 21 U.S.C. section 360bbb-3(b)(1), unless the authorization is terminated or revoked.  Performed at [facility], [place] [address], [place], [place] [address]   MRSA Next Gen by PCR, Nasal     Status: None   Collection Time: 02/28/21  4:48 PM   Specimen: Nasal Mucosa; Nasal Swab  Result Value Ref Range Status   MRSA by PCR Next Gen NOT DETECTED NOT DETECTED Final    Comment: (NOTE) The GeneXpert MRSA Assay (FDA approved for NASAL specimens only), is one component of a comprehensive MRSA colonization surveillance program. It is not intended to diagnose MRSA infection nor to guide or monitor treatment for MRSA infections. Test performance is not FDA approved in patients less than 35 years old. Performed at [facility], [place] [address], [place], [place] [address]       Studies: No results found.  Scheduled Meds:  vitamin C  500 mg Oral Daily   chlorhexidine  15 mL Mouth Rinse BID   Chlorhexidine Gluconate  Cloth  6 each Topical Daily   cholecalciferol  1,000 Units Oral Daily   donepezil  10 mg Oral QHS   haloperidol  0.5 mg Oral BID   mouth rinse  15 mL Mouth Rinse q12n4p   melatonin  3 mg Oral QHS   memantine  10 mg Oral BID   nirmatrelvir/ritonavir EUA  3 tablet Oral BID   traZODone  50 mg Oral QHS   zinc sulfate  220 mg Oral Daily    Continuous Infusions:  sodium chloride Stopped (03/02/21 0425)   amiodarone 30 mg/hr (03/02/21 1436)   ampicillin-sulbactam (UNASYN) IV Stopped (03/02/21 0933)   heparin 1,350 Units/hr (03/02/21 1436)     LOS: 3 days     Kayleen Memos, MD [facility] Pager [phone]  If 7PM-7AM, please contact night-coverage [email or web address] Password [facility] 03/02/2021, 3:26 PM

## 2021-03-02 NOTE — Progress Notes (Signed)
ANTICOAGULATION CONSULT NOTE - follow up  Pharmacy Consult for heparin Indication: atrial fibrillation  No Known Allergies  Patient Measurements: Height: 6\' 1"  (185.4 cm) Weight: 81.6 kg (180 lb) IBW/kg (Calculated) : 79.9 Heparin Dosing Weight: n/a. Use TBW  Vital Signs: Temp: 97.5 F (36.4 C) (12/19 2349) Temp Source: Axillary (12/19 2349) BP: 145/94 (12/20 0326) Pulse Rate: 51 (12/20 0326)  Labs: Recent Labs    02/28/21 0359 02/28/21 1308 02/28/21 2313 03/01/21 0303 03/01/21 0911 03/02/21 0247  HGB 13.8  --   --  12.4*  --  12.4*  HCT 41.7  --   --  36.9*  --  38.4*  PLT 144*  --   --  132*  --  116*  HEPARINUNFRC  --   --  0.49  --  0.40 0.20*  CREATININE 0.89 0.89  --  1.18  --  1.72*     Estimated Creatinine Clearance: 36.8 mL/min (A) (by C-G formula based on SCr of 1.72 mg/dL (H)).   Medical History: Past Medical History:  Diagnosis Date   Gallstones    asymptomatic   Hearing loss of right ear 01/23/2017   Hepatic Cysts    Hyperlipidemia    Hyperthyroidism 01/28/2013   Mild neurocognitive disorder 09/13/2016   Prostatitis    requiring a trip to the ER in [place] with a fever - 09   Type 2 diabetes mellitus without complication, without long-term current use of insulin ([place]) 03/30/2015    Medications: Patient not on anticoagulants PTA  Assessment: Pt is an 72 yoM admitted with COVID PNA. Pharmacy consulted to dose heparin for new onset afib.   Today, 03/02/21 HL 0.2 subtherapeutic on 1200 units/hr CBC: Hgb WNL, Plt slightly low but stable SCr increased to 1.72 No bleeding or complications with infusion reported  Goal of Therapy:  Heparin level 0.3-0.7 units/ml Monitor platelets by anticoagulation protocol: Yes   Plan:  Increase heparin drip to 1350 units/hr Heparin level in 8 hours Daily CBC and heparin level  Dolly Rias RPh 03/02/2021, 4:28 AM

## 2021-03-02 NOTE — Progress Notes (Signed)
Pharmacy - IV heparin  Assessment:    Please see note from Peggyann Juba, PharmD earlier today for full details.  Briefly, 85 y.o. male on IV heparin for Afib  Most recent heparin level therapeutic at 0.37 on 1350 units/hr No bleeding or infusion issues per RN  Plan:  Continue heparin at 1350 units/hr Daily heparin level  Reuel Boom, PharmD, BCPS [phone] 03/02/2021, 9:41 PM

## 2021-03-02 NOTE — Progress Notes (Signed)
03/02/2021 Able to administer PO medications at bedtime by crushing and mixing in applesauce. Lopressor held due to bradycardia, per order parameters. Paxlovid also not administered, due to inability to crush medication. Provider notified of failure to administer Paxlovid. Cindy S. Brigitte Pulse BSN, RN, [facility] 03/02/2021 5:37 AM

## 2021-03-03 DIAGNOSIS — Z515 Encounter for palliative care: Secondary | ICD-10-CM | POA: Diagnosis not present

## 2021-03-03 DIAGNOSIS — I4891 Unspecified atrial fibrillation: Secondary | ICD-10-CM

## 2021-03-03 DIAGNOSIS — U071 COVID-19: Secondary | ICD-10-CM | POA: Diagnosis not present

## 2021-03-03 DIAGNOSIS — R5381 Other malaise: Secondary | ICD-10-CM | POA: Diagnosis not present

## 2021-03-03 LAB — COMPREHENSIVE METABOLIC PANEL
ALT: 37 U/L (ref 0–44)
AST: 41 U/L (ref 15–41)
Albumin: 2.8 g/dL — ABNORMAL LOW (ref 3.5–5.0)
Alkaline Phosphatase: 52 U/L (ref 38–126)
Anion gap: 7 (ref 5–15)
BUN: 21 mg/dL (ref 8–23)
CO2: 24 mmol/L (ref 22–32)
Calcium: 8.9 mg/dL (ref 8.9–10.3)
Chloride: 109 mmol/L (ref 98–111)
Creatinine, Ser: 0.92 mg/dL (ref 0.61–1.24)
GFR, Estimated: >60 mL/min (ref >60)
Glucose, Bld: 116 mg/dL — ABNORMAL HIGH (ref 70–99)
Potassium: 3.6 mmol/L (ref 3.5–5.1)
Sodium: 140 mmol/L (ref 135–145)
Total Bilirubin: 1 mg/dL (ref 0.3–1.2)
Total Protein: 5.7 g/dL — ABNORMAL LOW (ref 6.5–8.1)

## 2021-03-03 LAB — CBC WITH DIFFERENTIAL/PLATELET
Abs Immature Granulocytes: 0.02 10*3/uL (ref 0.00–0.07)
Basophils Absolute: 0 10*3/uL (ref 0.0–0.1)
Basophils Relative: 0 %
Eosinophils Absolute: 0 10*3/uL (ref 0.0–0.5)
Eosinophils Relative: 1 %
HCT: 40.5 % (ref 39.0–52.0)
Hemoglobin: 13.5 g/dL (ref 13.0–17.0)
Immature Granulocytes: 0 %
Lymphocytes Relative: 11 %
Lymphs Abs: 0.7 10*3/uL (ref 0.7–4.0)
MCH: 30.4 pg (ref 26.0–34.0)
MCHC: 33.3 g/dL (ref 30.0–36.0)
MCV: 91.2 fL (ref 80.0–100.0)
Monocytes Absolute: 0.5 10*3/uL (ref 0.1–1.0)
Monocytes Relative: 8 %
Neutro Abs: 5.2 10*3/uL (ref 1.7–7.7)
Neutrophils Relative %: 80 %
Platelets: 124 10*3/uL — ABNORMAL LOW (ref 150–400)
RBC: 4.44 MIL/uL (ref 4.22–5.81)
RDW: 13.6 % (ref 11.5–15.5)
WBC: 6.5 10*3/uL (ref 4.0–10.5)
nRBC: 0 % (ref 0.0–0.2)

## 2021-03-03 LAB — GLUCOSE, CAPILLARY
Glucose-Capillary: 95 mg/dL (ref 70–99)
Glucose-Capillary: 101 mg/dL — ABNORMAL HIGH (ref 70–99)
Glucose-Capillary: 102 mg/dL — ABNORMAL HIGH (ref 70–99)
Glucose-Capillary: 125 mg/dL — ABNORMAL HIGH (ref 70–99)

## 2021-03-03 LAB — C-REACTIVE PROTEIN: CRP: 7.8 mg/dL — ABNORMAL HIGH (ref <1.0)

## 2021-03-03 LAB — PHOSPHORUS: Phosphorus: 3.1 mg/dL (ref 2.5–4.6)

## 2021-03-03 LAB — D-DIMER, QUANTITATIVE: D-Dimer, Quant: 1.13 ug/mL-FEU — ABNORMAL HIGH (ref 0.00–0.50)

## 2021-03-03 LAB — FERRITIN: Ferritin: 450 ng/mL — ABNORMAL HIGH (ref 24–336)

## 2021-03-03 LAB — MAGNESIUM: Magnesium: 2.1 mg/dL (ref 1.7–2.4)

## 2021-03-03 LAB — T4, FREE: Free T4: 1.39 ng/dL — ABNORMAL HIGH (ref 0.61–1.12)

## 2021-03-03 LAB — HEPARIN LEVEL (UNFRACTIONATED): Heparin Unfractionated: 0.26 IU/mL — ABNORMAL LOW (ref 0.30–0.70)

## 2021-03-03 MED ORDER — HYDRALAZINE HCL 25 MG PO TABS
25.0000 mg | ORAL_TABLET | ORAL | Status: DC | PRN
  Filled 2021-03-03: qty 1

## 2021-03-03 MED ORDER — SODIUM CHLORIDE 0.9 % IV SOLN
200.0000 mg | Freq: Once | INTRAVENOUS | Status: AC
  Administered 2021-03-03: 10:00:00 200 mg via INTRAVENOUS
  Filled 2021-03-03: qty 200

## 2021-03-03 MED ORDER — HYDRALAZINE HCL 25 MG PO TABS
25.0000 mg | ORAL_TABLET | ORAL | Status: DC | PRN
  Administered 2021-03-03 – 2021-03-04 (×3): 25 mg via ORAL
  Filled 2021-03-03 (×2): qty 1

## 2021-03-03 MED ORDER — SODIUM CHLORIDE 0.9 % IV SOLN
100.0000 mg | Freq: Every day | INTRAVENOUS | Status: AC
  Administered 2021-03-04 – 2021-03-05 (×2): 100 mg via INTRAVENOUS
  Filled 2021-03-03 (×2): qty 20

## 2021-03-03 NOTE — Assessment & Plan Note (Signed)
-   continue dysphagia diet

## 2021-03-03 NOTE — Assessment & Plan Note (Signed)
-   Statin on hold for now

## 2021-03-03 NOTE — Progress Notes (Signed)
Progress Note    WEILAND TOMICH   MIW:[id number]  DOB: 04/16/1935  DOA: 02/26/2021     4 PCP: Dorothyann Peng, NP  Initial CC: Weakness, confusion  Hospital Course: Mr. Goettl is an 85 yo male with PMH newly diagnosed Lewy body dementia, Parkinson disease, hearing impairment, hepatic cyst, HLD, hypothyroidism, mild neurocognitive disorder, prostatitis, type II DM who presented to [facility] ED from home where he lives with his wife due to a fall, generalized weakness, and confusion x1 day.    Workup revealed Covid-19 viral infection.  CT head/cervical spine were non acute.  UA was equivocal.  He was started on antiviral Paxlovid.  He was not hypoxic. Due to concern for aspiration pneumonia was started on Unasyn on 02/28/2021, switched to Augmentin on 03/02/21.   Hospital course complicated by agitation delirium and new onset A. fib with RVR requiring amiodarone drip and heparin drip for CHA2DS2-VASc of 3.  Transferred to stepdown unit on 02/28/2021 for drip and close monitoring.   Interval History:  No events overnight.  His son Will is present this morning.  Patient is overall feeling better since admission.  Denies any chest pain or shortness of breath this morning.  We encouraged him to continue to work on nutritional intake.  Assessment & Plan: * COVID-19 virus infection - nonhypoxic - was originally started on paxlovid but his intake has been poor and he's missed several doses; will change to remdesivir and complete at least 3 day course - trend inflammatory markers; if remains stable/improving likely will do 3 day course -Continue Augmentin for possible aspiration as well  Atrial fibrillation with RVR ([place]) - No prior known history of similar.  Agree likely precipitated due to underlying COVID-19 infection - Has now converted back to normal sinus rhythm.  He is also having significant amount of hematuria since heparin drip started; therefore, discontinue heparin drip now - Remains on  amiodarone drip per cardiology however given NSR and infection improving, will stop for now and monitor on tele for any afib recurrence.   Physical deconditioning - Per son, patient is typically ambulatory at home, using some assistance from a cane at times - Likely deconditioned in setting of prolonged hospitalization and COVID infection - PT/OT eval's: SNF recommended   Hypocalcemia - Replete as needed  Mild neurocognitive disorder - Likely due to underlying recent diagnosis of Lewy body dementia - Continue delirium precautions  Normocytic anemia - Baseline hemoglobin 12 to 13 g/dL - Currently at baseline - Monitor CBC while inpatient  Mild protein malnutrition ([place]) - continue dysphagia diet  Type 2 diabetes mellitus without complication, without long-term current use of insulin (HCC) - Last A1c 5.7% on 02/27/2021 - Continue diet control  Hyperthyroidism - TSH suppressed on admission, 0.143 - check FT4 and TT3  Allergic rhinitis - Continue treatment for COVID  Hyperlipidemia - Statin on hold for now    Old records reviewed in assessment of this patient  Antimicrobials: Remdesivir 03/03/2021 >> current Unasyn 02/28/2021 >> 03/01/2021 Augmentin 03/02/2021 >> current  DVT prophylaxis: d/c heparin drip; use SCD for now  Code Status:   Code Status: Full Code  Disposition Plan:  SNF Status is: Inpt  Objective: Blood pressure (!) 160/71, pulse 66, temperature 98.1 F (36.7 C), temperature source Axillary, resp. rate 15, height 6\' 1"  (1.854 m), weight 81.6 kg, SpO2 98 %.  Examination:  Physical Exam Constitutional:      General: He is not in acute distress.    Comments: Pleasant chronically  ill-appearing elderly gentleman laying in bed in no distress  HENT:     Head: Normocephalic and atraumatic.     Mouth/Throat:     Mouth: Mucous membranes are moist.  Eyes:     Extraocular Movements: Extraocular movements intact.  Cardiovascular:     Rate and Rhythm:  Normal rate and regular rhythm.     Heart sounds: Normal heart sounds.  Pulmonary:     Comments: No wheezing, coarse breath sounds bilaterally Abdominal:     General: Bowel sounds are normal. There is no distension.     Palpations: Abdomen is soft.     Tenderness: There is no abdominal tenderness.  Musculoskeletal:        General: Normal range of motion.     Cervical back: Normal range of motion and neck supple.  Skin:    General: Skin is warm and dry.  Neurological:     General: No focal deficit present.  Psychiatric:        Mood and Affect: Mood normal.        Behavior: Behavior normal.     Consultants:  Cardiology  Procedures:    Data Reviewed: I have personally reviewed labs and imaging studies    LOS: 4 days  Time spent: Greater than 50% of the 35 minute visit was spent in counseling/coordination of care for the patient as laid out in the A&P.   Dwyane Dee, MD [facility] 03/03/2021, 2:29 PM

## 2021-03-03 NOTE — Assessment & Plan Note (Signed)
-   Replete as needed 

## 2021-03-03 NOTE — Assessment & Plan Note (Addendum)
-   No prior known history of similar.  Agree likely precipitated due to underlying COVID-19 infection - Has now converted back to normal sinus rhythm.  He is also having significant amount of hematuria since heparin drip started; therefore, discontinued heparin drip 12/21 - d/c'd amio on 12/21 as well; remains sinus

## 2021-03-03 NOTE — Assessment & Plan Note (Signed)
-   Baseline hemoglobin 12 to 13 g/dL - Currently at baseline - Monitor CBC while inpatient

## 2021-03-03 NOTE — Assessment & Plan Note (Addendum)
-   no hypoxia  - was originally started on paxlovid but his intake has been poor and he's missed several doses; changed to remdesivir on 12/21 and complete at least 3 day course -3-day course remdesivir completed on 03/05/2021 -7-day course completed of Unasyn followed by Augmentin, okay to discontinue at this time

## 2021-03-03 NOTE — Assessment & Plan Note (Addendum)
-   Per son, patient is typically ambulatory at home, using some assistance from a cane at times - Likely deconditioned in setting of prolonged hospitalization and COVID infection - PT/OT eval's: SNF recommended

## 2021-03-03 NOTE — Hospital Course (Addendum)
Mr. Hutmacher is an 85 yo male with PMH newly diagnosed Lewy body dementia, Parkinson disease, hearing impairment, hepatic cyst, HLD, hypothyroidism, mild neurocognitive disorder, prostatitis, type II DM who presented to [facility] ED from home where he lives with his wife due to a fall, generalized weakness, and confusion x1 day.    Workup revealed Covid-19 viral infection.  CT head/cervical spine were non acute.  UA was equivocal.  He was started on antiviral Paxlovid.  He was not hypoxic. Due to concern for aspiration pneumonia was started on Unasyn on 02/28/2021, switched to Augmentin on 03/02/21.   Hospital course complicated by agitation delirium and new onset A. fib with RVR requiring amiodarone drip and heparin drip for CHA2DS2-VASc of 3.  Transferred to stepdown unit on 02/28/2021 for closer monitoring.  He was able to be weaned off of heparin and amiodarone drips.

## 2021-03-03 NOTE — Assessment & Plan Note (Signed)
-   Continue treatment for COVID

## 2021-03-03 NOTE — Assessment & Plan Note (Addendum)
-   TSH suppressed on admission, 0.143 - check FT4 (elevated 1.39) -Possibly contributing to precipitating his A. fib - Will need outpatient follow-up for ongoing monitoring - Check thyroid ultrasound: 4 nodules and 2 complex cysts noted (none were recommended for biopsy or follow up per TI-RADS criteria) - may need TFTs repeated in 4-6 weeks after acute illness resolves as well

## 2021-03-03 NOTE — Assessment & Plan Note (Signed)
-   Last A1c 5.7% on 02/27/2021 - Continue diet control

## 2021-03-03 NOTE — Consult Note (Signed)
Consultation Note Date: 03/03/2021   Patient Name: Erik Lara  DOB: June 27, 1935  MRN: [id number]  Age / Sex: 85 y.o., male  PCP: Dorothyann Peng, NP Referring Physician: Dwyane Dee, MD  Reason for Consultation: Establishing goals of care  HPI/Patient Profile: 85 y.o. male    admitted on 02/26/2021    Clinical Assessment and Goals of Care:  85 yo gentleman, lives at home with his wife, has newly diagnosed lewy body dementia, parkinson disease, DM, hypothyroidism, admitted with fall, generalized weakness, and confusion, found to have COVID 19 infection, hospital course complicated by A fib with RVR, physical deconditioning.  A palliative consult has been requested for goals of care discussions. Patient is an elderly gentleman sitting up in bed, he is able to interact some with his wife who is at bedside.   Palliative medicine is specialized medical care for people living with serious illness. It focuses on providing relief from the symptoms and stress of a serious illness. The goal is to improve quality of life for both the patient and the family. Goals of care: Broad aims of medical therapy in relation to the patient's values and preferences. Our aim is to provide medical care aimed at enabling patients to achieve the goals that matter most to them, given the circumstances of their particular medical situation and their constraints.   Goals, wishes and values attempted to be explored, discussed with wife at bedside, see below.   HCPOA  Wife son.   SUMMARY OF RECOMMENDATIONS    Full code for now, patient's wife states that they have discussed in the past, that he has a living will which she will review with her son.  Continue scope of current hospitalization, monitor PO intake, participation with PT, hospital course and overall disease trajectory of illness.  Recommend SNF rehab with palliative care  on discharge.  Thank you for the consult.   Code Status/Advance Care Planning: Full code   Symptom Management:     Palliative Prophylaxis:  Delirium Protocol  Additional Recommendations (Limitations, Scope, Preferences): Full Scope Treatment  Psycho-social/Spiritual:  Desire for further Chaplaincy support:yes Additional Recommendations: Caregiving  Support/Resources  Prognosis:  Unable to determine  Discharge Planning: [facility] for rehab with Palliative care service follow-up      Primary Diagnoses: Present on Admission:  Hyperlipidemia  Allergic rhinitis  Hyperthyroidism  Mild neurocognitive disorder  Hypocalcemia  Mild protein malnutrition ([place])  Normocytic anemia  COVID-19 virus infection   I have reviewed the medical record, interviewed the patient and family, and examined the patient. The following aspects are pertinent.  Past Medical History:  Diagnosis Date   Gallstones    asymptomatic   Hearing loss of right ear 01/23/2017   Hepatic Cysts    Hyperlipidemia    Hyperthyroidism 01/28/2013   Mild neurocognitive disorder 09/13/2016   Prostatitis    requiring a trip to the ER in [place] with a fever - 09   Type 2 diabetes mellitus without complication, without long-term current use of insulin ([place])  03/30/2015   Social History   Socioeconomic History   Marital status: Married    Spouse name: Not on file   Number of children: 3   Years of education: 18   Highest education level: Master's degree (e.g., MA, MS, MEng, MEd, MSW, MBA)  Occupational History   Occupation: Retired  Tobacco Use   Smoking status: Never   Smokeless tobacco: Never  Vaping Use   Vaping Use: Never used  Substance and Sexual Activity   Alcohol use: Not Currently    Alcohol/week: 0.0 standard drinks    Comment: Beer weekly/glass of wine daily   Drug use: No   Sexual activity: Not Currently    Partners: Female  Other Topics Concern   Not on file  Social  History Narrative   Lives with wife      Right handed      Master's Degree from [employer]   Social Determinants of Health   Financial Resource Strain: Not on file  Food Insecurity: Not on file  Transportation Needs: Not on file  Physical Activity: Not on file  Stress: Not on file  Social Connections: Not on file   Family History  Problem Relation Age of Onset   Cancer Other        breast   Diabetes Other    Scheduled Meds:  amoxicillin-clavulanate  1 tablet Oral Q12H   vitamin C  500 mg Oral Daily   chlorhexidine  15 mL Mouth Rinse BID   Chlorhexidine Gluconate Cloth  6 each Topical Daily   cholecalciferol  1,000 Units Oral Daily   donepezil  10 mg Oral QHS   haloperidol  0.5 mg Oral BID   mouth rinse  15 mL Mouth Rinse q12n4p   melatonin  3 mg Oral QHS   memantine  10 mg Oral BID   traZODone  50 mg Oral QHS   zinc sulfate  220 mg Oral Daily   Continuous Infusions:  sodium chloride Stopped (03/02/21 0425)   lactated ringers 50 mL/hr at 03/03/21 1045   [START ON 03/04/2021] remdesivir 100 mg in NS 100 mL     PRN Meds:.sodium chloride, acetaminophen **OR** acetaminophen, albuterol, guaiFENesin-dextromethorphan, haloperidol lactate, hydrALAZINE, lip balm, metoprolol tartrate, ondansetron **OR** ondansetron (ZOFRAN) IV Medications Prior to Admission:  Prior to Admission medications   Medication Sig Start Date End Date Taking? Authorizing Provider  cetirizine (ZYRTEC) 10 MG tablet Take 10 mg by mouth daily.   Yes [provider]  donepezil (ARICEPT) 10 MG tablet Take 1 tablet (10 mg total) by mouth at bedtime. 10/13/20  Yes Cameron Sprang, MD  memantine (NAMENDA) 10 MG tablet Take 1 tablet twice a day Patient taking differently: Take 10 mg by mouth 2 (two) times daily. 12/11/20  Yes Cameron Sprang, MD  simvastatin (ZOCOR) 20 MG tablet TAKE ONE TABLET BY MOUTH EVERY NIGHT AT BEDTIME Patient taking differently: Take 20 mg by mouth at bedtime. 10/05/20  Yes Nafziger, Tommi Rumps,  NP   No Known Allergies Review of Systems Does not appear in distress Physical Exam Elderly gentleman resting in bed No distress Coarse breath sounds S 1 S 2  Abdomen not distended  Vital Signs: BP (!) 144/108 (BP Location: Right Arm)    Pulse 61    Temp 98 F (36.7 C) (Axillary)    Resp 13    Ht 6\' 1"  (1.854 m)    Wt 81.6 kg    SpO2 98%    BMI 23.75 kg/m  Pain Scale:  PAINAD   Pain Score: 0-No pain   SpO2: SpO2: 98 % O2 Device:SpO2: 98 % O2 Flow Rate: .   IO: Intake/output summary:  Intake/Output Summary (Last 24 hours) at 03/03/2021 1627 Last data filed at 03/03/2021 1200 Gross per 24 hour  Intake 2142.36 ml  Output 675 ml  Net 1467.36 ml    LBM: Last BM Date: 03/03/21 Baseline Weight: Weight: 82 kg Most recent weight: Weight: 81.6 kg     Palliative Assessment/Data:   PPS 40%  Time In:  1530 Time Out:  1630 Time Total:  60 min.  Greater than 50%  of this time was spent counseling and coordinating care related to the above assessment and plan.  Signed by: Loistine Chance, MD   Please contact Palliative Medicine Team phone at [phone] for questions and concerns.  For individual provider: See Shea Evans

## 2021-03-03 NOTE — Assessment & Plan Note (Signed)
-   Likely due to underlying recent diagnosis of Lewy body dementia - Continue delirium precautions

## 2021-03-03 NOTE — Progress Notes (Addendum)
ANTICOAGULATION CONSULT NOTE - Follow Up Consult  Pharmacy Consult for Heparin Indication: atrial fibrillation  No Known Allergies  Patient Measurements: Height: 6\' 1"  (185.4 cm) Weight: 81.6 kg (180 lb) IBW/kg (Calculated) : 79.9 Heparin Dosing Weight: total weight  Vital Signs: Temp: 97.9 F (36.6 C) (12/20 2022) Temp Source: Axillary (12/20 2022) BP: 159/102 (12/21 0300) Pulse Rate: 68 (12/21 0300)  Labs: Recent Labs    03/01/21 0303 03/01/21 0911 03/02/21 0247 03/02/21 1312 03/02/21 2101 03/03/21 0303  HGB 12.4*  --  12.4*  --   --  13.5  HCT 36.9*  --  38.4*  --   --  40.5  PLT 132*  --  116*  --   --  124*  HEPARINUNFRC  --    < > 0.20* 0.41 0.37 0.26*  CREATININE 1.18  --  1.72* 1.36*  --  0.92   < > = values in this interval not displayed.     Estimated Creatinine Clearance: 68.8 mL/min (by C-G formula based on SCr of 0.92 mg/dL).   Medications:  Scheduled:   amoxicillin-clavulanate  1 tablet Oral Q12H   vitamin C  500 mg Oral Daily   chlorhexidine  15 mL Mouth Rinse BID   Chlorhexidine Gluconate Cloth  6 each Topical Daily   cholecalciferol  1,000 Units Oral Daily   donepezil  10 mg Oral QHS   haloperidol  0.5 mg Oral BID   mouth rinse  15 mL Mouth Rinse q12n4p   melatonin  3 mg Oral QHS   memantine  10 mg Oral BID   nirmatrelvir/ritonavir EUA  3 tablet Oral BID   traZODone  50 mg Oral QHS   zinc sulfate  220 mg Oral Daily   Infusions:   sodium chloride Stopped (03/02/21 0425)   amiodarone 30 mg/hr (03/03/21 0303)   heparin 1,350 Units/hr (03/03/21 0211)   lactated ringers 50 mL/hr at 03/02/21 1900   PRN: sodium chloride, acetaminophen **OR** acetaminophen, albuterol, guaiFENesin-dextromethorphan, haloperidol lactate, lip balm, metoprolol tartrate, ondansetron **OR** ondansetron (ZOFRAN) IV  Assessment:  77 yoM admitted with COVID PNA. Pharmacy consulted to dose heparin for new onset afib with RVR. CHA2DS2-VASc of 3.  No anticoagulations  PTA.  Today, 03/03/2021 Heparin level sub-therapeutic (0.26) on 1350 units/hr CBC: Hgb stable. 13.5, Plts low/stable at 124 SCr improved to baseline No bleeding or infusion related issues reported  Goal of Therapy:  Heparin level 0.3-0.7 units/ml Monitor platelets by anticoagulation protocol: Yes   Plan:  Increase heparin infusion to 14.5 units/hr Recheck heparin level in 8hrs Daily heparin level and CBC Monitor for signs/symptoms of bleeding  Netta Cedars, PharmD, BCPS Pharmacy: [phone] 03/03/2021,4:37 AM

## 2021-03-04 ENCOUNTER — Inpatient Hospital Stay (HOSPITAL_COMMUNITY): Payer: PPO

## 2021-03-04 DIAGNOSIS — R609 Edema, unspecified: Secondary | ICD-10-CM | POA: Diagnosis not present

## 2021-03-04 DIAGNOSIS — I4891 Unspecified atrial fibrillation: Secondary | ICD-10-CM | POA: Diagnosis not present

## 2021-03-04 DIAGNOSIS — U071 COVID-19: Secondary | ICD-10-CM | POA: Diagnosis not present

## 2021-03-04 DIAGNOSIS — I82612 Acute embolism and thrombosis of superficial veins of left upper extremity: Secondary | ICD-10-CM | POA: Insufficient documentation

## 2021-03-04 DIAGNOSIS — L538 Other specified erythematous conditions: Secondary | ICD-10-CM

## 2021-03-04 LAB — COMPREHENSIVE METABOLIC PANEL
ALT: 33 U/L (ref 0–44)
AST: 32 U/L (ref 15–41)
Albumin: 2.7 g/dL — ABNORMAL LOW (ref 3.5–5.0)
Alkaline Phosphatase: 49 U/L (ref 38–126)
Anion gap: 7 (ref 5–15)
BUN: 15 mg/dL (ref 8–23)
CO2: 23 mmol/L (ref 22–32)
Calcium: 8.6 mg/dL — ABNORMAL LOW (ref 8.9–10.3)
Chloride: 108 mmol/L (ref 98–111)
Creatinine, Ser: 0.71 mg/dL (ref 0.61–1.24)
GFR, Estimated: >60 mL/min (ref >60)
Glucose, Bld: 112 mg/dL — ABNORMAL HIGH (ref 70–99)
Potassium: 3.1 mmol/L — ABNORMAL LOW (ref 3.5–5.1)
Sodium: 138 mmol/L (ref 135–145)
Total Bilirubin: 1 mg/dL (ref 0.3–1.2)
Total Protein: 5.3 g/dL — ABNORMAL LOW (ref 6.5–8.1)

## 2021-03-04 LAB — CBC WITH DIFFERENTIAL/PLATELET
Abs Immature Granulocytes: 0.02 10*3/uL (ref 0.00–0.07)
Basophils Absolute: 0 10*3/uL (ref 0.0–0.1)
Basophils Relative: 0 %
Eosinophils Absolute: 0.1 10*3/uL (ref 0.0–0.5)
Eosinophils Relative: 1 %
HCT: 36.8 % — ABNORMAL LOW (ref 39.0–52.0)
Hemoglobin: 12.4 g/dL — ABNORMAL LOW (ref 13.0–17.0)
Immature Granulocytes: 0 %
Lymphocytes Relative: 11 %
Lymphs Abs: 0.7 10*3/uL (ref 0.7–4.0)
MCH: 30.2 pg (ref 26.0–34.0)
MCHC: 33.7 g/dL (ref 30.0–36.0)
MCV: 89.5 fL (ref 80.0–100.0)
Monocytes Absolute: 0.6 10*3/uL (ref 0.1–1.0)
Monocytes Relative: 10 %
Neutro Abs: 4.8 10*3/uL (ref 1.7–7.7)
Neutrophils Relative %: 78 %
Platelets: 153 10*3/uL (ref 150–400)
RBC: 4.11 MIL/uL — ABNORMAL LOW (ref 4.22–5.81)
RDW: 13.5 % (ref 11.5–15.5)
WBC: 6.1 10*3/uL (ref 4.0–10.5)
nRBC: 0 % (ref 0.0–0.2)

## 2021-03-04 LAB — MAGNESIUM: Magnesium: 2 mg/dL (ref 1.7–2.4)

## 2021-03-04 LAB — T3: T3, Total: 57 ng/dL — ABNORMAL LOW (ref 71–180)

## 2021-03-04 LAB — GLUCOSE, CAPILLARY
Glucose-Capillary: 97 mg/dL (ref 70–99)
Glucose-Capillary: 161 mg/dL — ABNORMAL HIGH (ref 70–99)
Glucose-Capillary: 123 mg/dL — ABNORMAL HIGH (ref 70–99)
Glucose-Capillary: 104 mg/dL — ABNORMAL HIGH (ref 70–99)

## 2021-03-04 LAB — LACTATE DEHYDROGENASE: LDH: 177 U/L (ref 98–192)

## 2021-03-04 LAB — D-DIMER, QUANTITATIVE: D-Dimer, Quant: 1.59 ug/mL-FEU — ABNORMAL HIGH (ref 0.00–0.50)

## 2021-03-04 LAB — C-REACTIVE PROTEIN: CRP: 6.1 mg/dL — ABNORMAL HIGH (ref <1.0)

## 2021-03-04 MED ORDER — POTASSIUM CHLORIDE 20 MEQ PO PACK
40.0000 meq | PACK | ORAL | Status: AC
  Administered 2021-03-04 (×2): 40 meq via ORAL
  Filled 2021-03-04 (×2): qty 2

## 2021-03-04 NOTE — Progress Notes (Signed)
PT Cancellation Note  Patient Details Name: Erik Lara MRN: [id number] DOB: 1936/01/14   Cancelled Treatment:     limited participation during OT session due to level of alertness/cognition.  Also awaiting VAS [place] completion.  Pt has been evaluated with rec for SNF.  Will continue to follow during Acute care stay and attempt to see another day as schedule permits.  Rica Koyanagi  PTA Acute  Rehabilitation Services Pager      [phone] Office      [phone]

## 2021-03-04 NOTE — Progress Notes (Addendum)
°  RN info Noted   IV amiodarone was D/C'd on 12/21 at 1424. Heparin D/C'd 12/21 at 1025 due to hematuria   Telemetry reviewed, patient remains in sinus rhythm. HR in the 80s while awake and the 60s while sleeping. Occasional PVCs and PACs   Patient was given hydralazine 25mg  for blood pressure twice on 12/21, once at 1228 and once at 2019. Metoprolol tartrate 2.5 mg IV once on 12/21 at 0307. Patient has not been given any medications for their blood pressure on 12/22. BP currently 150/75  Margie Billet, PA-C 03/04/2021 10:53 AM Patient remains in sinus rhythm.  Amiodarone discontinued.  We will follow [facility]

## 2021-03-04 NOTE — Progress Notes (Signed)
Progress Note    Erik Lara   UKG:[id number]  DOB: Jun 11, 1935  DOA: 02/26/2021     5 PCP: Dorothyann Peng, NP  Initial CC: Weakness, confusion  Hospital Course: Erik Lara is an 85 yo male with PMH newly diagnosed Lewy body dementia, Parkinson disease, hearing impairment, hepatic cyst, HLD, hypothyroidism, mild neurocognitive disorder, prostatitis, type II DM who presented to [facility] ED from home where he lives with his wife due to a fall, generalized weakness, and confusion x1 day.    Workup revealed Covid-19 viral infection.  CT head/cervical spine were non acute.  UA was equivocal.  He was started on antiviral Paxlovid.  He was not hypoxic. Due to concern for aspiration pneumonia was started on Unasyn on 02/28/2021, switched to Augmentin on 03/02/21.   Hospital course complicated by agitation delirium and new onset A. fib with RVR requiring amiodarone drip and heparin drip for CHA2DS2-VASc of 3.  Transferred to stepdown unit on 02/28/2021 for cardizem drip and closer monitoring.   Interval History:  No events overnight.   Left arm is swollen and red this morning.  Duplex ordered for further evaluation of it. He was otherwise resting in bed comfortable about the same as yesterday.  His appetite has still been "fair".  Assessment & Plan: * COVID-19 virus infection- (present on admission) - no hypoxia  - was originally started on paxlovid but his intake has been poor and he's missed several doses; changed to remdesivir on 12/21 and complete at least 3 day course - trend inflammatory markers; if remains stable/improving likely will do 3 day course -Continue Augmentin for possible aspiration as well  Atrial fibrillation with RVR ([place]) - No prior known history of similar.  Agree likely precipitated due to underlying COVID-19 infection - Has now converted back to normal sinus rhythm.  He is also having significant amount of hematuria since heparin drip started; therefore, discontinued  heparin drip 12/21 - d/c'd amio on 12/21 as well; remains sinus  Left arm swelling - possible DVT or SVT; forearm has palable cord and is erythematous, warm, and edematous - check LUE duplex  Physical deconditioning - Per son, patient is typically ambulatory at home, using some assistance from a cane at times - Likely deconditioned in setting of prolonged hospitalization and COVID infection - PT/OT eval's: SNF recommended   Hypocalcemia- (present on admission) - Replete as needed  Mild neurocognitive disorder- (present on admission) - Likely due to underlying recent diagnosis of Lewy body dementia - Continue delirium precautions  Normocytic anemia- (present on admission) - Baseline hemoglobin 12 to 13 g/dL - Currently at baseline - Monitor CBC while inpatient  Mild protein malnutrition ([place])- (present on admission) - continue dysphagia diet  Type 2 diabetes mellitus without complication, without long-term current use of insulin ([facility]) - Last A1c 5.7% on 02/27/2021 - Continue diet control  Hyperthyroidism- (present on admission) - TSH suppressed on admission, 0.143 - check FT4 (elevated 1.39) -Possibly contributing to precipitating his A. fib - Will need outpatient follow-up for ongoing monitoring - Check thyroid ultrasound  Allergic rhinitis- (present on admission) - Continue treatment for COVID  Hyperlipidemia- (present on admission) - Statin on hold for now    Old records reviewed in assessment of this patient  Antimicrobials: Remdesivir 03/03/2021 >> current Unasyn 02/28/2021 >> 03/01/2021 Augmentin 03/02/2021 >> current  DVT prophylaxis: d/c heparin drip; use SCD for now  Code Status:   Code Status: Full Code  Disposition Plan:  SNF Status is: Inpt  Objective:  Blood pressure (!) 144/101, pulse 69, temperature 97.7 F (36.5 C), temperature source Oral, resp. rate 20, height 6\' 1"  (1.854 m), weight 81.6 kg, SpO2 100 %.  Examination:  Physical  Exam Constitutional:      General: He is not in acute distress.    Comments: Pleasant chronically ill-appearing elderly gentleman laying in bed in no distress  HENT:     Head: Normocephalic and atraumatic.     Mouth/Throat:     Mouth: Mucous membranes are moist.  Eyes:     Extraocular Movements: Extraocular movements intact.  Cardiovascular:     Rate and Rhythm: Regular rhythm. Bradycardia present.     Heart sounds: Normal heart sounds.  Pulmonary:     Comments: No wheezing, coarse breath sounds bilaterally Abdominal:     General: Bowel sounds are normal. There is no distension.     Palpations: Abdomen is soft.     Tenderness: There is no abdominal tenderness.  Musculoskeletal:        General: Normal range of motion.     Cervical back: Normal range of motion and neck supple.  Skin:    General: Skin is warm and dry.  Neurological:     General: No focal deficit present.  Psychiatric:        Mood and Affect: Mood normal.        Behavior: Behavior normal.     Consultants:  Cardiology  Procedures:    Data Reviewed: I have personally reviewed labs and imaging studies    LOS: 5 days  Time spent: Greater than 50% of the 35 minute visit was spent in counseling/coordination of care for the patient as laid out in the A&P.   Erik Dee, MD [facility] 03/04/2021, 1:00 PM

## 2021-03-04 NOTE — Assessment & Plan Note (Addendum)
-   SVT found in left basilic and left cephalic vein - Continue supportive care with ice or heat as needed, elevation

## 2021-03-04 NOTE — Progress Notes (Signed)
Occupational Therapy Treatment Patient Details Name: Erik Lara MRN: [id number] DOB: [birth year] Today's Date: 03/04/2021   History of present illness Patient is a 85 year old male who presented to the hosptial after a fall at home with weakness and confusion. patient was found to have COVID 19, a fib with RVR, acute metabolic encephalopathy, and suspected aspiration pneumonia. PMH: lewy body dementia, parkinsons, hearing impairment.   OT comments  Patient exhibited improved ability to participate in ADLs ain bed including washing his face, brushing his teeth and drinking from a cup. He had to be taken out of safety mittens to participate. He is overall limited by continued altered mental status and lethargy. Continue to recommend short term rehab at discharge.   Recommendations for follow up therapy are one component of a multi-disciplinary discharge planning process, led by the attending physician.  Recommendations may be updated based on patient status, additional functional criteria and insurance authorization.    Follow Up Recommendations  Skilled nursing-short term rehab (<3 hours/day)    Assistance Recommended at Discharge Frequent or constant Supervision/Assistance  Equipment Recommendations  None recommended by OT    Recommendations for Other Services      Precautions / Restrictions Precautions Precautions: Fall Restrictions Weight Bearing Restrictions: No       Mobility Bed Mobility                    Transfers                         Balance                                           ADL either performed or assessed with clinical judgement   ADL Overall ADL's : Needs assistance/impaired Eating/Feeding: Maximal assistance Eating/Feeding Details (indicate cue type and reason): max assist for feeding. Attempted to have patient drink from cup and patient squeezed cup so hard he collapsed it and caused it to overrun. Patient  able to initially drink from straw. needed hand over hand for second bout of drinking - then had difficulty drinking from straw (possibly due to getting sleepy). Grooming: Oral care;Wash/dry face;Moderate assistance Grooming Details (indicate cue type and reason): able to wash his face with thoroughness. Able to grossly brush teeth though poor quality (managing tooth brush. Needs assistance to bring cup to mouth and hold basin). Neesd verbal and tactile cues to swish and spit.                                    Extremity/Trunk Assessment              Vision       Perception     Praxis      Cognition Arousal/Alertness: Lethargic Behavior During Therapy: Flat affect Overall Cognitive Status: Impaired/Different from baseline                                 General Comments: Able to follow simple commands with verbal and tactile cues.          Exercises Other Exercises Other Exercises: AAROM of bilateral shoulders to promote ROM   Shoulder Instructions       General Comments  Pertinent Vitals/ Pain       Pain Assessment: Faces Faces Pain Scale: Hurts a little bit Breathing: normal Negative Vocalization: none Facial Expression: smiling or inexpressive Body Language: relaxed Consolability: no need to console PAINAD Score: 0 Pain Location: Left shoulder Pain Descriptors / Indicators:  (reports pain when asked but doesn't exhibit any pain behaviors) Pain Intervention(s): Monitored during session  Home Living                                          Prior Functioning/Environment              Frequency  Min 2X/week        Progress Toward Goals  OT Goals(current goals can now be found in the care plan section)  Progress towards OT goals: Progressing toward goals  Acute Rehab OT Goals OT Goal Formulation: Patient unable to participate in goal setting Time For Goal Achievement: 03/15/21 Potential to  Achieve Goals: [place] Discharge plan remains appropriate    Co-evaluation                 AM-PAC OT "6 Clicks" Daily Activity     Outcome Measure   Help from another person eating meals?: A Lot Help from another person taking care of personal grooming?: A Lot Help from another person toileting, which includes using toliet, bedpan, or urinal?: Total Help from another person bathing (including washing, rinsing, drying)?: A Lot Help from another person to put on and taking off regular upper body clothing?: A Lot Help from another person to put on and taking off regular lower body clothing?: Total 6 Click Score: 10    End of Session    OT Visit Diagnosis: Unsteadiness on feet (R26.81);Muscle weakness (generalized) (M62.81);Other symptoms and signs involving cognitive function   Activity Tolerance Patient limited by lethargy   Patient Left in bed;with call bell/phone within reach;with bed alarm set   Nurse Communication  (okay to see)        Time: 1030-1051 OT Time Calculation (min): 21 min  Charges: OT General Charges $OT Visit: 1 Visit OT Treatments $Self Care/Home Management : 8-22 mins  Derl Barrow, OTR/L [place]  Office [phone] Pager: [place] 03/04/2021, 1:08 PM

## 2021-03-04 NOTE — Progress Notes (Signed)
L forearm noted to be red, hot to the touch and swollen. Radial and brachial pulses +2. NP notified. No new orders received at this time.

## 2021-03-04 NOTE — Plan of Care (Signed)
°  Problem: Safety: Goal: Ability to remain free from injury will improve Outcome: Progressing   Problem: Skin Integrity: Goal: Risk for impaired skin integrity will decrease Outcome: Progressing   Problem: Coping: Goal: Psychosocial and spiritual needs will be supported Outcome: Progressing   Problem: Respiratory: Goal: Will maintain a patent airway Outcome: Progressing Goal: Complications related to the disease process, condition or treatment will be avoided or minimized Outcome: Progressing   Problem: Coping: Goal: Level of anxiety will decrease Outcome: Not Progressing

## 2021-03-04 NOTE — Progress Notes (Signed)
Left upper extremity venous duplex completed. Refer to "CV Proc" under chart review to view preliminary results.  03/04/2021 2:12 PM Kelby Aline., MHA, RVT, RDCS, RDMS

## 2021-03-04 NOTE — Progress Notes (Signed)
Speech Language Pathology Treatment: Dysphagia  Patient Details Name: Erik Lara MRN: [id number] DOB: 1935/05/03 Today's Date: 03/04/2021 Time: 2992-4268 SLP Time Calculation (min) (ACUTE ONLY): 22 min  Assessment / Plan / Recommendation Clinical Impression  Pt seen today to assess dysphagia goals - including po tolerance and readiness for dietary advancement.  Upon entrance to room, pt found with IV out of his left arm - informed RN.  Pt alert, dysarthric and stated he wanted "water".  He refused to consume any solids with this SLP even with offering several options.  Pt consumed approximately 6 ounces of water and 3 ounces cranberry juice.  Cough (subtle without distress) observed x3 during intake - decreased significantly with providing control of bolus amounts to single sips. Given his cognitive deficits, consern for adequacy of nutrition and hydration is present, as well as pt inability to generalize precautions.  However last CXR showed low lung volumes without acute pulmonary disease on 12/18, WBC is 6.1 - thus pt appears to be clinically tolerating minimal amount of aspiration likely occuring with thin liquids.  RN denies pt having overt coughing episodes and advised pt has clear lung sounds.     Continue dys3/thin- consider water with meals - with full supervision.  Do not recommend to restrict diet beyond current regimen due to pt's poor intake and tolerance.    SLP will follow up next week, barring any negative acute changes over the weekend.   RN can page [phone] if there are concerns over the weekend.   No family present at this time. Thank you.    HPI HPI: Patient is a 85 year old male who presented to the hosptial after a fall at home with weakness and confusion. patient was found to have COVID 19, a fib with RVR, acute metabolic encephalopathy, and suspected aspiration pneumonia. PMH: lewy body dementia, parkinsons, hearing impairment.  Pt has been in the hospital for six  days- follow up for swallow function indicated.  Palliative has been following and plan is full code with treatment per notes. RN reports pt tolerating po but with poor intake.      SLP Plan  Continue with current plan of care      Recommendations for follow up therapy are one component of a multi-disciplinary discharge planning process, led by the attending physician.  Recommendations may be updated based on patient status, additional functional criteria and insurance authorization.    Recommendations  Diet recommendations: Dysphagia 3 (mechanical soft);Thin liquid (consider water with meals) Liquids provided via: Straw;Cup;Teaspoon Medication Administration:  (crush with puree - whole with puree and tolerates) Supervision: Full supervision/cueing for compensatory strategies Compensations: Minimize environmental distractions;Slow rate;Small sips/bites Postural Changes and/or Swallow Maneuvers: Seated upright 90 degrees;Upright 30-60 min after meal                Oral Care Recommendations: Oral care BID Follow Up Recommendations: Skilled nursing-short term rehab (<3 hours/day) Assistance recommended at discharge: Frequent or constant Supervision/Assistance SLP Visit Diagnosis: Dysphagia, unspecified (R13.10) Plan: Continue with current plan of care         Kathleen Lime, MS [place] Office [phone] Pager [phone]   Erik Lara  03/04/2021, 4:47 PM

## 2021-03-05 ENCOUNTER — Inpatient Hospital Stay (HOSPITAL_COMMUNITY): Payer: PPO

## 2021-03-05 DIAGNOSIS — I4891 Unspecified atrial fibrillation: Secondary | ICD-10-CM | POA: Diagnosis not present

## 2021-03-05 DIAGNOSIS — R319 Hematuria, unspecified: Secondary | ICD-10-CM

## 2021-03-05 DIAGNOSIS — K6389 Other specified diseases of intestine: Secondary | ICD-10-CM | POA: Diagnosis not present

## 2021-03-05 DIAGNOSIS — U071 COVID-19: Secondary | ICD-10-CM | POA: Diagnosis not present

## 2021-03-05 DIAGNOSIS — K3189 Other diseases of stomach and duodenum: Secondary | ICD-10-CM | POA: Diagnosis not present

## 2021-03-05 DIAGNOSIS — L8990 Pressure ulcer of unspecified site, unspecified stage: Secondary | ICD-10-CM | POA: Insufficient documentation

## 2021-03-05 LAB — COMPREHENSIVE METABOLIC PANEL
ALT: 33 U/L (ref 0–44)
AST: 28 U/L (ref 15–41)
Albumin: 2.6 g/dL — ABNORMAL LOW (ref 3.5–5.0)
Alkaline Phosphatase: 46 U/L (ref 38–126)
Anion gap: 5 (ref 5–15)
BUN: 18 mg/dL (ref 8–23)
CO2: 24 mmol/L (ref 22–32)
Calcium: 8.9 mg/dL (ref 8.9–10.3)
Chloride: 114 mmol/L — ABNORMAL HIGH (ref 98–111)
Creatinine, Ser: 0.78 mg/dL (ref 0.61–1.24)
GFR, Estimated: >60 mL/min (ref >60)
Glucose, Bld: 118 mg/dL — ABNORMAL HIGH (ref 70–99)
Potassium: 3.4 mmol/L — ABNORMAL LOW (ref 3.5–5.1)
Sodium: 143 mmol/L (ref 135–145)
Total Bilirubin: 1.2 mg/dL (ref 0.3–1.2)
Total Protein: 5.3 g/dL — ABNORMAL LOW (ref 6.5–8.1)

## 2021-03-05 LAB — GLUCOSE, CAPILLARY
Glucose-Capillary: 103 mg/dL — ABNORMAL HIGH (ref 70–99)
Glucose-Capillary: 124 mg/dL — ABNORMAL HIGH (ref 70–99)
Glucose-Capillary: 138 mg/dL — ABNORMAL HIGH (ref 70–99)
Glucose-Capillary: 114 mg/dL — ABNORMAL HIGH (ref 70–99)

## 2021-03-05 LAB — CBC WITH DIFFERENTIAL/PLATELET
Abs Immature Granulocytes: 0.04 10*3/uL (ref 0.00–0.07)
Basophils Absolute: 0 10*3/uL (ref 0.0–0.1)
Basophils Relative: 0 %
Eosinophils Absolute: 0 10*3/uL (ref 0.0–0.5)
Eosinophils Relative: 1 %
HCT: 36.1 % — ABNORMAL LOW (ref 39.0–52.0)
Hemoglobin: 11.9 g/dL — ABNORMAL LOW (ref 13.0–17.0)
Immature Granulocytes: 1 %
Lymphocytes Relative: 10 %
Lymphs Abs: 0.7 10*3/uL (ref 0.7–4.0)
MCH: 30.1 pg (ref 26.0–34.0)
MCHC: 33 g/dL (ref 30.0–36.0)
MCV: 91.4 fL (ref 80.0–100.0)
Monocytes Absolute: 1 10*3/uL (ref 0.1–1.0)
Monocytes Relative: 13 %
Neutro Abs: 5.9 10*3/uL (ref 1.7–7.7)
Neutrophils Relative %: 75 %
Platelets: 178 10*3/uL (ref 150–400)
RBC: 3.95 MIL/uL — ABNORMAL LOW (ref 4.22–5.81)
RDW: 13.6 % (ref 11.5–15.5)
WBC: 7.7 10*3/uL (ref 4.0–10.5)
nRBC: 0 % (ref 0.0–0.2)

## 2021-03-05 LAB — C-REACTIVE PROTEIN: CRP: 6.7 mg/dL — ABNORMAL HIGH (ref <1.0)

## 2021-03-05 LAB — MAGNESIUM: Magnesium: 2 mg/dL (ref 1.7–2.4)

## 2021-03-05 LAB — D-DIMER, QUANTITATIVE: D-Dimer, Quant: 5.93 ug/mL-FEU — ABNORMAL HIGH (ref 0.00–0.50)

## 2021-03-05 LAB — LACTATE DEHYDROGENASE: LDH: 179 U/L (ref 98–192)

## 2021-03-05 MED ORDER — HYDRALAZINE HCL 20 MG/ML IJ SOLN
10.0000 mg | INTRAMUSCULAR | Status: AC | PRN
  Filled 2021-03-05: qty 1

## 2021-03-05 MED ORDER — POTASSIUM CHLORIDE 20 MEQ PO PACK
40.0000 meq | PACK | Freq: Once | ORAL | Status: AC
  Administered 2021-03-05: 08:00:00 40 meq via ORAL
  Filled 2021-03-05: qty 2

## 2021-03-05 MED ORDER — HYDRALAZINE HCL 25 MG PO TABS
25.0000 mg | ORAL_TABLET | ORAL | Status: DC | PRN
  Administered 2021-03-05 (×2): 25 mg via ORAL
  Filled 2021-03-05 (×2): qty 1

## 2021-03-05 NOTE — Progress Notes (Addendum)
Progress Note    Erik Lara   [id number]  DOB: 1935/08/19  DOA: 02/26/2021     6 PCP: Dorothyann Peng, NP  Initial CC: Weakness, confusion  Hospital Course: Erik Lara is an 85 yo male with PMH newly diagnosed Lewy body dementia, Parkinson disease, hearing impairment, hepatic cyst, HLD, hypothyroidism, mild neurocognitive disorder, prostatitis, type II DM who presented to [facility] ED from home where he lives with his wife due to a fall, generalized weakness, and confusion x1 day.    Workup revealed Covid-19 viral infection.  CT head/cervical spine were non acute.  UA was equivocal.  He was started on antiviral Paxlovid.  He was not hypoxic. Due to concern for aspiration pneumonia was started on Unasyn on 02/28/2021, switched to Augmentin on 03/02/21.   Hospital course complicated by agitation delirium and new onset A. fib with RVR requiring amiodarone drip and heparin drip for CHA2DS2-VASc of 3.  Transferred to stepdown unit on 02/28/2021 for cardizem drip and closer monitoring.   Interval History:  No events overnight. A little more talkative today when seen.  Still not eating more than 50% of his meals.  Assessment & Plan: * COVID-19 virus infection- (present on admission) - no hypoxia  - was originally started on paxlovid but his intake has been poor and he's missed several doses; changed to remdesivir on 12/21 and complete at least 3 day course - trend inflammatory markers; if remains stable/improving likely will do 3 day course -Continue Augmentin for possible aspiration as well  Atrial fibrillation with RVR ([place]) - No prior known history of similar.  Agree likely precipitated due to underlying COVID-19 infection - Has now converted back to normal sinus rhythm.  He is also having significant amount of hematuria since heparin drip started; therefore, discontinued heparin drip 12/21 - d/c'd amio on 12/21 as well; remains sinus  Hematuria - Developed after initiation of  heparin drip which was then discontinued - Still having ongoing hematuria but no clots noted in container - Hemoglobin still relatively stable; slight drop but not substantial to be explained by hematuria -Continue monitoring and if still no improvement in the next couple days may consider some irrigation or if develops clots  Superficial venous thrombosis of arm, left - SVT found in left basilic and left cephalic vein - Continue supportive care with ice or heat as needed, elevation  Physical deconditioning - Per son, patient is typically ambulatory at home, using some assistance from a cane at times - Likely deconditioned in setting of prolonged hospitalization and COVID infection - PT/OT eval's: SNF recommended   Hypocalcemia- (present on admission) - Replete as needed  Mild neurocognitive disorder- (present on admission) - Likely due to underlying recent diagnosis of Lewy body dementia - Continue delirium precautions  Normocytic anemia- (present on admission) - Baseline hemoglobin 12 to 13 g/dL - Currently at baseline - Monitor CBC while inpatient  Mild protein malnutrition ([place])- (present on admission) - continue dysphagia diet  Type 2 diabetes mellitus without complication, without long-term current use of insulin ([place]) - Last A1c 5.7% on 02/27/2021 - Continue diet control  Hyperthyroidism- (present on admission) - TSH suppressed on admission, 0.143 - check FT4 (elevated 1.39) -Possibly contributing to precipitating his A. fib - Will need outpatient follow-up for ongoing monitoring - Check thyroid ultrasound: 4 nodules and 2 complex cysts noted (none were recommended for biopsy or follow up per TI-RADS criteria) - may need TFTs repeated in 4-6 weeks after acute illness resolves as well  Allergic rhinitis- (present on admission) - Continue treatment for COVID  Hyperlipidemia- (present on admission) - Statin on hold for now    Old records reviewed in assessment of  this patient  Antimicrobials: Remdesivir 03/03/2021 >> current Unasyn 02/28/2021 >> 03/01/2021 Augmentin 03/02/2021 >> current  DVT prophylaxis: d/c'd heparin drip; use SCD for now  Code Status:   Code Status: Full Code  Disposition Plan:  SNF Status is: Inpt  Objective: Blood pressure (!) 162/71, pulse 85, temperature 98.5 F (36.9 C), temperature source Oral, resp. rate 20, height 6\' 1"  (1.854 m), weight 81.6 kg, SpO2 94 %.  Examination:  Physical Exam Constitutional:      General: He is not in acute distress.    Comments: Pleasant chronically ill-appearing elderly gentleman laying in bed in no distress  HENT:     Head: Normocephalic and atraumatic.     Mouth/Throat:     Mouth: Mucous membranes are moist.  Eyes:     Extraocular Movements: Extraocular movements intact.  Cardiovascular:     Rate and Rhythm: Regular rhythm. Bradycardia present.     Heart sounds: Normal heart sounds.  Pulmonary:     Comments: No wheezing, coarse breath sounds bilaterally Abdominal:     General: Bowel sounds are normal. There is no distension.     Palpations: Abdomen is soft.     Tenderness: There is no abdominal tenderness.  Musculoskeletal:        General: Normal range of motion.     Cervical back: Normal range of motion and neck supple.  Skin:    General: Skin is warm and dry.  Neurological:     General: No focal deficit present.  Psychiatric:        Mood and Affect: Mood normal.        Behavior: Behavior normal.     Consultants:  Cardiology  Procedures:    Data Reviewed: I have personally reviewed labs and imaging studies    LOS: 6 days  Time spent: Greater than 50% of the 35 minute visit was spent in counseling/coordination of care for the patient as laid out in the A&P.   Dwyane Dee, MD [facility] 03/05/2021, 4:56 PM

## 2021-03-05 NOTE — Plan of Care (Signed)
Problem: Clinical Measurements: Goal: Ability to maintain clinical measurements within normal limits will improve Outcome: Progressing Goal: Cardiovascular complication will be avoided Outcome: Progressing   Problem: Coping: Goal: Level of anxiety will decrease Outcome: Progressing   Problem: Skin Integrity: Goal: Risk for impaired skin integrity will decrease Outcome: Not Progressing   Problem: Clinical Measurements: Goal: Respiratory complications will improve Outcome: Completed/Met

## 2021-03-05 NOTE — Assessment & Plan Note (Addendum)
-   Developed after initiation of heparin drip which was then discontinued -Foley catheter placed on 03/06/2021, hematuria following resolved after this time - Continue Foley for now and possibly will attempt trial of void in 2- 3 days; but his deconditioning puts him at risk for further urinary retention

## 2021-03-05 NOTE — Progress Notes (Addendum)
°  Telemetry reviewed. Patient remains in sinus rhythm with a heart rate around 70 bpm without any new episodes of A fib. Patient had a 14 beat episode of v tach at 1033 that spontaneously reverted to sinus rhythm. Nurse was in the room with the patient at the time feeding patient breakfast.    Last dose of amiodarone was on 03/03/2021 at 1045.  Heparin remains held due to hematuria.   Patient was giving 1 dose of hydralazine for blood pressure on 03/04/21.  No blood pressure medicines have been given on 03/05/2021.   Other issues per primary service.   Margie Billet, PA-C 03/05/2021 10:37 AM As above.  Telemetry reviewed.  Amiodarone was discontinued.  Patient remains in sinus rhythm with 14 beats nonsustained ventricular tachycardia.  Previous atrial fibrillation likely secondary to the hyperadrenergic state associated with COVID infection.  Therefore I think it is reasonable to follow off of amiodarone.  LV function is normal.  Anticoagulation discontinued due to hematuria.  Therefore would not resume. Cardiology will sign off.  Please call with questions. Kirk Ruths

## 2021-03-06 DIAGNOSIS — R338 Other retention of urine: Secondary | ICD-10-CM

## 2021-03-06 DIAGNOSIS — R5381 Other malaise: Secondary | ICD-10-CM | POA: Diagnosis not present

## 2021-03-06 DIAGNOSIS — R319 Hematuria, unspecified: Secondary | ICD-10-CM

## 2021-03-06 DIAGNOSIS — I4891 Unspecified atrial fibrillation: Secondary | ICD-10-CM | POA: Diagnosis not present

## 2021-03-06 DIAGNOSIS — U071 COVID-19: Secondary | ICD-10-CM | POA: Diagnosis not present

## 2021-03-06 LAB — COMPREHENSIVE METABOLIC PANEL
ALT: 36 U/L (ref 0–44)
AST: 28 U/L (ref 15–41)
Albumin: 2.4 g/dL — ABNORMAL LOW (ref 3.5–5.0)
Alkaline Phosphatase: 50 U/L (ref 38–126)
Anion gap: 6 (ref 5–15)
BUN: 27 mg/dL — ABNORMAL HIGH (ref 8–23)
CO2: 25 mmol/L (ref 22–32)
Calcium: 8.9 mg/dL (ref 8.9–10.3)
Chloride: 112 mmol/L — ABNORMAL HIGH (ref 98–111)
Creatinine, Ser: 1.04 mg/dL (ref 0.61–1.24)
GFR, Estimated: >60 mL/min (ref >60)
Glucose, Bld: 125 mg/dL — ABNORMAL HIGH (ref 70–99)
Potassium: 3.4 mmol/L — ABNORMAL LOW (ref 3.5–5.1)
Sodium: 143 mmol/L (ref 135–145)
Total Bilirubin: 1.2 mg/dL (ref 0.3–1.2)
Total Protein: 5.3 g/dL — ABNORMAL LOW (ref 6.5–8.1)

## 2021-03-06 LAB — GLUCOSE, CAPILLARY
Glucose-Capillary: 102 mg/dL — ABNORMAL HIGH (ref 70–99)
Glucose-Capillary: 146 mg/dL — ABNORMAL HIGH (ref 70–99)
Glucose-Capillary: 105 mg/dL — ABNORMAL HIGH (ref 70–99)
Glucose-Capillary: 128 mg/dL — ABNORMAL HIGH (ref 70–99)

## 2021-03-06 LAB — CBC WITH DIFFERENTIAL/PLATELET
Abs Immature Granulocytes: 0.05 10*3/uL (ref 0.00–0.07)
Basophils Absolute: 0 10*3/uL (ref 0.0–0.1)
Basophils Relative: 0 %
Eosinophils Absolute: 0.1 10*3/uL (ref 0.0–0.5)
Eosinophils Relative: 1 %
HCT: 36.8 % — ABNORMAL LOW (ref 39.0–52.0)
Hemoglobin: 11.7 g/dL — ABNORMAL LOW (ref 13.0–17.0)
Immature Granulocytes: 1 %
Lymphocytes Relative: 11 %
Lymphs Abs: 0.8 10*3/uL (ref 0.7–4.0)
MCH: 29.9 pg (ref 26.0–34.0)
MCHC: 31.8 g/dL (ref 30.0–36.0)
MCV: 94.1 fL (ref 80.0–100.0)
Monocytes Absolute: 1.1 10*3/uL — ABNORMAL HIGH (ref 0.1–1.0)
Monocytes Relative: 14 %
Neutro Abs: 5.6 10*3/uL (ref 1.7–7.7)
Neutrophils Relative %: 73 %
Platelets: 197 10*3/uL (ref 150–400)
RBC: 3.91 MIL/uL — ABNORMAL LOW (ref 4.22–5.81)
RDW: 14 % (ref 11.5–15.5)
WBC: 7.6 10*3/uL (ref 4.0–10.5)
nRBC: 0 % (ref 0.0–0.2)

## 2021-03-06 LAB — D-DIMER, QUANTITATIVE: D-Dimer, Quant: >20 ug/mL-FEU — ABNORMAL HIGH (ref 0.00–0.50)

## 2021-03-06 LAB — C-REACTIVE PROTEIN: CRP: 7.5 mg/dL — ABNORMAL HIGH (ref <1.0)

## 2021-03-06 LAB — MAGNESIUM: Magnesium: 2.2 mg/dL (ref 1.7–2.4)

## 2021-03-06 LAB — LACTATE DEHYDROGENASE: LDH: 163 U/L (ref 98–192)

## 2021-03-06 MED ORDER — POLYETHYLENE GLYCOL 3350 17 G PO PACK
17.0000 g | PACK | Freq: Two times a day (BID) | ORAL | Status: DC
  Administered 2021-03-06 – 2021-03-13 (×13): 17 g via ORAL
  Filled 2021-03-06 (×14): qty 1

## 2021-03-06 MED ORDER — SENNOSIDES-DOCUSATE SODIUM 8.6-50 MG PO TABS
1.0000 | ORAL_TABLET | Freq: Two times a day (BID) | ORAL | Status: DC
  Administered 2021-03-06 – 2021-03-13 (×14): 1 via ORAL
  Filled 2021-03-06 (×14): qty 1

## 2021-03-06 MED ORDER — POTASSIUM CHLORIDE 20 MEQ PO PACK
40.0000 meq | PACK | Freq: Once | ORAL | Status: AC
  Administered 2021-03-06: 11:00:00 40 meq via ORAL
  Filled 2021-03-06: qty 2

## 2021-03-06 NOTE — Progress Notes (Signed)
Progress Note    Erik Lara   URK:[id number]  DOB: [birth year]  DOA: 02/26/2021     7 PCP: Dorothyann Peng, NP  Initial CC: Weakness, confusion  Hospital Course: Erik Lara is an 85 yo male with PMH newly diagnosed Lewy body dementia, Parkinson disease, hearing impairment, hepatic cyst, HLD, hypothyroidism, mild neurocognitive disorder, prostatitis, type II DM who presented to [facility] ED from home where he lives with his wife due to a fall, generalized weakness, and confusion x1 day.    Workup revealed Covid-19 viral infection.  CT head/cervical spine were non acute.  UA was equivocal.  He was started on antiviral Paxlovid.  He was not hypoxic. Due to concern for aspiration pneumonia was started on Unasyn on 02/28/2021, switched to Augmentin on 03/02/21.   Hospital course complicated by agitation delirium and new onset A. fib with RVR requiring amiodarone drip and heparin drip for CHA2DS2-VASc of 3.  Transferred to stepdown unit on 02/28/2021 for cardizem drip and closer monitoring.   Interval History:  No events overnight.  A little more awake and alert this morning.  Following commands easily and was asking for water.  Denied any chest pain, palpitations, shortness of breath. Abdomen was more distended this morning and we did a bladder scan, he had a significant amount of residual urine and indwelling Foley catheter was placed.  Gross hematuria still appreciated in basin.   Assessment & Plan: * COVID-19 virus infection- (present on admission) - no hypoxia  - was originally started on paxlovid but his intake has been poor and he's missed several doses; changed to remdesivir on 12/21 and complete at least 3 day course -3-day course remdesivir completed on 03/05/2021 -7-day course completed of Unasyn followed by Augmentin, okay to discontinue at this time  Atrial fibrillation with RVR ([place]) - No prior known history of similar.  Agree likely precipitated due to underlying COVID-19  infection - Has now converted back to normal sinus rhythm.  He is also having significant amount of hematuria since heparin drip started; therefore, discontinued heparin drip 12/21 - d/c'd amio on 12/21 as well; remains sinus  Hematuria - Developed after initiation of heparin drip which was then discontinued - Still having ongoing hematuria but no clots noted in container - Hemoglobin still relatively stable; slight drop but not substantial to be explained by hematuria -abdominal distension noted on 12/24; bladder scan showed ~1L urine, therefore will place indwelling foley for retention and in case of need for bladder irrigation   Superficial venous thrombosis of arm, left - SVT found in left basilic and left cephalic vein - Continue supportive care with ice or heat as needed, elevation  Physical deconditioning - Per son, patient is typically ambulatory at home, using some assistance from a cane at times - Likely deconditioned in setting of prolonged hospitalization and COVID infection - PT/OT eval's: SNF recommended   Hypocalcemia- (present on admission) - Replete as needed  Mild neurocognitive disorder- (present on admission) - Likely due to underlying recent diagnosis of Lewy body dementia - Continue delirium precautions  Normocytic anemia- (present on admission) - Baseline hemoglobin 12 to 13 g/dL - Currently at baseline - Monitor CBC while inpatient  Mild protein malnutrition ([place])- (present on admission) - continue dysphagia diet  Type 2 diabetes mellitus without complication, without long-term current use of insulin ([place]) - Last A1c 5.7% on 02/27/2021 - Continue diet control  Hyperthyroidism- (present on admission) - TSH suppressed on admission, 0.143 - check FT4 (elevated 1.39) -Possibly  contributing to precipitating his A. fib - Will need outpatient follow-up for ongoing monitoring - Check thyroid ultrasound: 4 nodules and 2 complex cysts noted (none were  recommended for biopsy or follow up per TI-RADS criteria) - may need TFTs repeated in 4-6 weeks after acute illness resolves as well   Allergic rhinitis- (present on admission) - Continue treatment for COVID  Hyperlipidemia- (present on admission) - Statin on hold for now    Old records reviewed in assessment of this patient  Antimicrobials: Remdesivir 03/03/2021 >> 12/23 Unasyn 02/28/2021 >> 03/01/2021 Augmentin 03/02/2021 >> 12/23  DVT prophylaxis: d/c'd heparin drip; use SCD for now  Code Status:   Code Status: Full Code  Disposition Plan:  SNF Status is: Inpt  Objective: Blood pressure (!) 128/56, pulse 68, temperature 98.3 F (36.8 C), temperature source Oral, resp. rate 15, height 6\' 1"  (1.854 m), weight 81.6 kg, SpO2 96 %.  Examination:  Physical Exam Constitutional:      General: He is not in acute distress.    Comments: Pleasant chronically ill-appearing elderly gentleman laying in bed in no distress  HENT:     Head: Normocephalic and atraumatic.     Mouth/Throat:     Mouth: Mucous membranes are moist.  Eyes:     Extraocular Movements: Extraocular movements intact.  Cardiovascular:     Rate and Rhythm: Normal rate and regular rhythm.     Heart sounds: Normal heart sounds.  Pulmonary:     Comments: No wheezing, coarse breath sounds bilaterally Abdominal:     General: Bowel sounds are normal. There is no distension.     Palpations: Abdomen is soft.     Tenderness: There is no abdominal tenderness.  Genitourinary:    Comments: Ongoing hematuria noted in basin  Musculoskeletal:        General: Normal range of motion.     Cervical back: Normal range of motion and neck supple.  Skin:    General: Skin is warm and dry.  Neurological:     General: No focal deficit present.  Psychiatric:        Mood and Affect: Mood normal.        Behavior: Behavior normal.     Consultants:  Cardiology  Procedures:    Data Reviewed: I have personally reviewed labs  and imaging studies    LOS: 7 days  Time spent: Greater than 50% of the 35 minute visit was spent in counseling/coordination of care for the patient as laid out in the A&P.   Dwyane Dee, MD [facility] 03/06/2021, 1:35 PM

## 2021-03-07 DIAGNOSIS — U071 COVID-19: Secondary | ICD-10-CM | POA: Diagnosis not present

## 2021-03-07 DIAGNOSIS — R319 Hematuria, unspecified: Secondary | ICD-10-CM | POA: Diagnosis not present

## 2021-03-07 DIAGNOSIS — I4891 Unspecified atrial fibrillation: Secondary | ICD-10-CM | POA: Diagnosis not present

## 2021-03-07 LAB — COMPREHENSIVE METABOLIC PANEL
ALT: 31 U/L (ref 0–44)
AST: 22 U/L (ref 15–41)
Albumin: 2.4 g/dL — ABNORMAL LOW (ref 3.5–5.0)
Alkaline Phosphatase: 47 U/L (ref 38–126)
Anion gap: 3 — ABNORMAL LOW (ref 5–15)
BUN: 20 mg/dL (ref 8–23)
CO2: 27 mmol/L (ref 22–32)
Calcium: 8.8 mg/dL — ABNORMAL LOW (ref 8.9–10.3)
Chloride: 113 mmol/L — ABNORMAL HIGH (ref 98–111)
Creatinine, Ser: 0.76 mg/dL (ref 0.61–1.24)
GFR, Estimated: >60 mL/min (ref >60)
Glucose, Bld: 119 mg/dL — ABNORMAL HIGH (ref 70–99)
Potassium: 3.3 mmol/L — ABNORMAL LOW (ref 3.5–5.1)
Sodium: 143 mmol/L (ref 135–145)
Total Bilirubin: 1.1 mg/dL (ref 0.3–1.2)
Total Protein: 5.1 g/dL — ABNORMAL LOW (ref 6.5–8.1)

## 2021-03-07 LAB — CBC WITH DIFFERENTIAL/PLATELET
Abs Immature Granulocytes: 0.05 10*3/uL (ref 0.00–0.07)
Basophils Absolute: 0 10*3/uL (ref 0.0–0.1)
Basophils Relative: 0 %
Eosinophils Absolute: 0.2 10*3/uL (ref 0.0–0.5)
Eosinophils Relative: 3 %
HCT: 33.6 % — ABNORMAL LOW (ref 39.0–52.0)
Hemoglobin: 10.9 g/dL — ABNORMAL LOW (ref 13.0–17.0)
Immature Granulocytes: 1 %
Lymphocytes Relative: 17 %
Lymphs Abs: 0.9 10*3/uL (ref 0.7–4.0)
MCH: 29.9 pg (ref 26.0–34.0)
MCHC: 32.4 g/dL (ref 30.0–36.0)
MCV: 92.3 fL (ref 80.0–100.0)
Monocytes Absolute: 0.6 10*3/uL (ref 0.1–1.0)
Monocytes Relative: 11 %
Neutro Abs: 3.7 10*3/uL (ref 1.7–7.7)
Neutrophils Relative %: 68 %
Platelets: 190 10*3/uL (ref 150–400)
RBC: 3.64 MIL/uL — ABNORMAL LOW (ref 4.22–5.81)
RDW: 14 % (ref 11.5–15.5)
WBC: 5.4 10*3/uL (ref 4.0–10.5)
nRBC: 0 % (ref 0.0–0.2)

## 2021-03-07 LAB — LACTATE DEHYDROGENASE: LDH: 152 U/L (ref 98–192)

## 2021-03-07 LAB — GLUCOSE, CAPILLARY
Glucose-Capillary: 111 mg/dL — ABNORMAL HIGH (ref 70–99)
Glucose-Capillary: 155 mg/dL — ABNORMAL HIGH (ref 70–99)
Glucose-Capillary: 109 mg/dL — ABNORMAL HIGH (ref 70–99)
Glucose-Capillary: 95 mg/dL (ref 70–99)

## 2021-03-07 LAB — C-REACTIVE PROTEIN: CRP: 6.2 mg/dL — ABNORMAL HIGH (ref <1.0)

## 2021-03-07 LAB — D-DIMER, QUANTITATIVE: D-Dimer, Quant: >20 ug/mL-FEU — ABNORMAL HIGH (ref 0.00–0.50)

## 2021-03-07 LAB — MAGNESIUM: Magnesium: 2 mg/dL (ref 1.7–2.4)

## 2021-03-07 MED ORDER — ENOXAPARIN SODIUM 40 MG/0.4ML IJ SOSY
40.0000 mg | PREFILLED_SYRINGE | INTRAMUSCULAR | Status: DC
  Administered 2021-03-07 – 2021-03-13 (×7): 40 mg via SUBCUTANEOUS
  Filled 2021-03-07 (×7): qty 0.4

## 2021-03-07 MED ORDER — POTASSIUM CHLORIDE 20 MEQ PO PACK
40.0000 meq | PACK | Freq: Once | ORAL | Status: AC
  Administered 2021-03-07: 09:00:00 40 meq via ORAL
  Filled 2021-03-07: qty 2

## 2021-03-07 NOTE — Progress Notes (Signed)
Progress Note    Erik Lara   OZD:[id number]  DOB: November 27, 1935  DOA: 02/26/2021     8 PCP: Dorothyann Peng, NP  Initial CC: Weakness, confusion  Hospital Course: Erik Lara is an 85 yo male with PMH newly diagnosed Lewy body dementia, Parkinson disease, hearing impairment, hepatic cyst, HLD, hypothyroidism, mild neurocognitive disorder, prostatitis, type II DM who presented to [facility] ED from home where he lives with his wife due to a fall, generalized weakness, and confusion x1 day.    Workup revealed Covid-19 viral infection.  CT head/cervical spine were non acute.  UA was equivocal.  He was started on antiviral Paxlovid.  He was not hypoxic. Due to concern for aspiration pneumonia was started on Unasyn on 02/28/2021, switched to Augmentin on 03/02/21.   Hospital course complicated by agitation delirium and new onset A. fib with RVR requiring amiodarone drip and heparin drip for CHA2DS2-VASc of 3.  Transferred to stepdown unit on 02/28/2021 for closer monitoring.  He was able to be weaned off of heparin and amiodarone drips.  Interval History:  No events overnight.   Hematuria has resolved after Foley placement yesterday.  He is awake and alert speaking in his typical fashion this morning.  Follows commands, still just very deconditioned.  Eating about 40 to 50% of his meals.  Assessment & Plan: * COVID-19 virus infection- (present on admission) - no hypoxia  - was originally started on paxlovid but his intake has been poor and he's missed several doses; changed to remdesivir on 12/21 and complete at least 3 day course -3-day course remdesivir completed on 03/05/2021 -7-day course completed of Unasyn followed by Augmentin, okay to discontinue at this time  Atrial fibrillation with RVR ([place]) - No prior known history of similar.  Agree likely precipitated due to underlying COVID-19 infection - Has now converted back to normal sinus rhythm.  He is also having significant amount of  hematuria since heparin drip started; therefore, discontinued heparin drip 12/21 - d/c'd amio on 12/21 as well; remains sinus  Superficial venous thrombosis of arm, left - SVT found in left basilic and left cephalic vein - Continue supportive care with ice or heat as needed, elevation  Hematuria-resolved as of 03/07/2021 - Developed after initiation of heparin drip which was then discontinued -Foley catheter placed on 03/06/2021, hematuria following resolved after this time - Continue Foley for now and will attempt trial of void in 1 to 2 days; but his deconditioning puts him at risk for further urinary retention still  Physical deconditioning - Per son, patient is typically ambulatory at home, using some assistance from a cane at times - Likely deconditioned in setting of prolonged hospitalization and COVID infection - PT/OT eval's: SNF recommended   Hypocalcemia- (present on admission) - Replete as needed  Mild neurocognitive disorder- (present on admission) - Likely due to underlying recent diagnosis of Lewy body dementia - Continue delirium precautions  Normocytic anemia- (present on admission) - Baseline hemoglobin 12 to 13 g/dL - Currently at baseline - Monitor CBC while inpatient  Mild protein malnutrition ([place])- (present on admission) - continue dysphagia diet  Type 2 diabetes mellitus without complication, without long-term current use of insulin ([place]) - Last A1c 5.7% on 02/27/2021 - Continue diet control  Hyperthyroidism- (present on admission) - TSH suppressed on admission, 0.143 - check FT4 (elevated 1.39) -Possibly contributing to precipitating his A. fib - Will need outpatient follow-up for ongoing monitoring - Check thyroid ultrasound: 4 nodules and 2 complex cysts  noted (none were recommended for biopsy or follow up per TI-RADS criteria) - may need TFTs repeated in 4-6 weeks after acute illness resolves as well   Allergic rhinitis- (present on admission) -  Continue treatment for COVID  Hyperlipidemia- (present on admission) - Statin on hold for now    Old records reviewed in assessment of this patient  Antimicrobials: Remdesivir 03/03/2021 >> 12/23 Unasyn 02/28/2021 >> 03/01/2021 Augmentin 03/02/2021 >> 12/23  DVT prophylaxis: d/c'd heparin drip; use SCD for now  Code Status:   Code Status: Full Code  Disposition Plan:  SNF Status is: Inpt  Objective: Blood pressure (!) 113/57, pulse 70, temperature 98.5 F (36.9 C), temperature source Oral, resp. rate 16, height 6\' 1"  (1.854 m), weight 81.6 kg, SpO2 95 %.  Examination:  Physical Exam Constitutional:      General: He is not in acute distress.    Comments: Pleasant chronically ill-appearing elderly gentleman laying in bed in no distress  HENT:     Head: Normocephalic and atraumatic.     Mouth/Throat:     Mouth: Mucous membranes are moist.  Eyes:     Extraocular Movements: Extraocular movements intact.  Cardiovascular:     Rate and Rhythm: Normal rate and regular rhythm.     Heart sounds: Normal heart sounds.  Pulmonary:     Comments: No wheezing, coarse breath sounds bilaterally Abdominal:     General: Bowel sounds are normal. There is no distension.     Palpations: Abdomen is soft.     Tenderness: There is no abdominal tenderness.  Genitourinary:    Comments: Urine in Foley bag now clear yellow Musculoskeletal:        General: Normal range of motion.     Cervical back: Normal range of motion and neck supple.  Skin:    General: Skin is warm and dry.  Neurological:     General: No focal deficit present.  Psychiatric:        Mood and Affect: Mood normal.        Behavior: Behavior normal.     Consultants:  Cardiology  Procedures:    Data Reviewed: I have personally reviewed labs and imaging studies    LOS: 8 days  Time spent: Greater than 50% of the 35 minute visit was spent in counseling/coordination of care for the patient as laid out in the A&P.    Dwyane Dee, MD [facility] 03/07/2021, 11:26 AM

## 2021-03-07 NOTE — Plan of Care (Signed)
°  Problem: Coping: Goal: Level of anxiety will decrease Outcome: Progressing   Problem: Elimination: Goal: Will not experience complications related to urinary retention Outcome: Progressing   Problem: Pain Managment: Goal: General experience of comfort will improve Outcome: Progressing   Problem: Safety: Goal: Ability to remain free from injury will improve Outcome: Progressing   Problem: Skin Integrity: Goal: Risk for impaired skin integrity will decrease Outcome: Progressing   Problem: Coping: Goal: Psychosocial and spiritual needs will be supported Outcome: Progressing   Problem: Respiratory: Goal: Will maintain a patent airway Outcome: Progressing   

## 2021-03-08 DIAGNOSIS — U071 COVID-19: Secondary | ICD-10-CM | POA: Diagnosis not present

## 2021-03-08 DIAGNOSIS — I4891 Unspecified atrial fibrillation: Secondary | ICD-10-CM | POA: Diagnosis not present

## 2021-03-08 DIAGNOSIS — R319 Hematuria, unspecified: Secondary | ICD-10-CM | POA: Diagnosis not present

## 2021-03-08 LAB — CBC WITH DIFFERENTIAL/PLATELET
Abs Immature Granulocytes: 0.05 10*3/uL (ref 0.00–0.07)
Basophils Absolute: 0 10*3/uL (ref 0.0–0.1)
Basophils Relative: 1 %
Eosinophils Absolute: 0.1 10*3/uL (ref 0.0–0.5)
Eosinophils Relative: 2 %
HCT: 33.2 % — ABNORMAL LOW (ref 39.0–52.0)
Hemoglobin: 10.6 g/dL — ABNORMAL LOW (ref 13.0–17.0)
Immature Granulocytes: 1 %
Lymphocytes Relative: 20 %
Lymphs Abs: 1.1 10*3/uL (ref 0.7–4.0)
MCH: 30.2 pg (ref 26.0–34.0)
MCHC: 31.9 g/dL (ref 30.0–36.0)
MCV: 94.6 fL (ref 80.0–100.0)
Monocytes Absolute: 0.6 10*3/uL (ref 0.1–1.0)
Monocytes Relative: 10 %
Neutro Abs: 3.9 10*3/uL (ref 1.7–7.7)
Neutrophils Relative %: 66 %
Platelets: 205 10*3/uL (ref 150–400)
RBC: 3.51 MIL/uL — ABNORMAL LOW (ref 4.22–5.81)
RDW: 14.1 % (ref 11.5–15.5)
WBC: 5.8 10*3/uL (ref 4.0–10.5)
nRBC: 0 % (ref 0.0–0.2)

## 2021-03-08 LAB — GLUCOSE, CAPILLARY
Glucose-Capillary: 94 mg/dL (ref 70–99)
Glucose-Capillary: 82 mg/dL (ref 70–99)
Glucose-Capillary: 96 mg/dL (ref 70–99)
Glucose-Capillary: 88 mg/dL (ref 70–99)

## 2021-03-08 LAB — COMPREHENSIVE METABOLIC PANEL
ALT: 27 U/L (ref 0–44)
AST: 19 U/L (ref 15–41)
Albumin: 2.2 g/dL — ABNORMAL LOW (ref 3.5–5.0)
Alkaline Phosphatase: 45 U/L (ref 38–126)
Anion gap: 3 — ABNORMAL LOW (ref 5–15)
BUN: 19 mg/dL (ref 8–23)
CO2: 29 mmol/L (ref 22–32)
Calcium: 8.7 mg/dL — ABNORMAL LOW (ref 8.9–10.3)
Chloride: 112 mmol/L — ABNORMAL HIGH (ref 98–111)
Creatinine, Ser: 0.71 mg/dL (ref 0.61–1.24)
GFR, Estimated: >60 mL/min (ref >60)
Glucose, Bld: 103 mg/dL — ABNORMAL HIGH (ref 70–99)
Potassium: 3.7 mmol/L (ref 3.5–5.1)
Sodium: 144 mmol/L (ref 135–145)
Total Bilirubin: 1 mg/dL (ref 0.3–1.2)
Total Protein: 4.9 g/dL — ABNORMAL LOW (ref 6.5–8.1)

## 2021-03-08 LAB — D-DIMER, QUANTITATIVE: D-Dimer, Quant: >20 ug/mL-FEU — ABNORMAL HIGH (ref 0.00–0.50)

## 2021-03-08 LAB — LACTATE DEHYDROGENASE: LDH: 151 U/L (ref 98–192)

## 2021-03-08 LAB — C-REACTIVE PROTEIN: CRP: 2.5 mg/dL — ABNORMAL HIGH (ref <1.0)

## 2021-03-08 LAB — MAGNESIUM: Magnesium: 2 mg/dL (ref 1.7–2.4)

## 2021-03-08 MED ORDER — JUVEN PO PACK
1.0000 | PACK | Freq: Two times a day (BID) | ORAL | Status: DC
  Administered 2021-03-09 – 2021-03-13 (×10): 1 via ORAL
  Filled 2021-03-08 (×10): qty 1

## 2021-03-08 MED ORDER — ADULT MULTIVITAMIN W/MINERALS CH
1.0000 | ORAL_TABLET | Freq: Every day | ORAL | Status: DC
  Administered 2021-03-08 – 2021-03-13 (×6): 1 via ORAL
  Filled 2021-03-08 (×6): qty 1

## 2021-03-08 MED ORDER — ENSURE ENLIVE PO LIQD
237.0000 mL | Freq: Three times a day (TID) | ORAL | Status: DC
  Administered 2021-03-08 – 2021-03-13 (×13): 237 mL via ORAL

## 2021-03-08 NOTE — Progress Notes (Signed)
Nutrition Follow-up  DOCUMENTATION CODES:  Not applicable  INTERVENTION:  Continue diet order per SLP.  Add Ensure Plus High Protein po TID, each supplement provides 350 kcal and 20 grams of protein.   Add 1 packet Juven BID, each packet provides 95 calories, 2.5 grams of protein (collagen), and 9.8 grams of carbohydrate (3 grams sugar); also contains 7 grams of L-arginine and L-glutamine, 300 mg vitamin C, 15 mg vitamin E, 1.2 mcg vitamin B-12, 9.5 mg zinc, 200 mg calcium, and 1.5 g  Calcium Beta-hydroxy-Beta-methylbutyrate to support wound healing.  Add MVI with minerals daily.  Obtain updated weight.  Encourage PO and supplement intake.   NUTRITION DIAGNOSIS:  Increased nutrient needs related to acute illness, catabolic illness ([id number] infection) as evidenced by estimated needs. - ongoing  GOAL:  Patient will meet greater than or equal to 90% of their needs - progressing  MONITOR:  PO intake, Supplement acceptance, Labs, Weight trends, Skin, I & O's  REASON FOR ASSESSMENT:  Malnutrition Screening Tool    ASSESSMENT:  85 y.o. male with medical history of symptomatic gallstones, R ear hearing loss, hepatic cyst, HLD, hypothyroidism, mild neurocognitive disorder, hx of prostatitis, and type 2 DM. He presented to the ED due to worsening generalized weakness, worsening dementia symptoms, and a fall the night prior. Family reported patient with new-onset incontinence of urine and stool and decreased appetite.  Per Epic, pt eating a varied amount, an average of 59% of meals.  Pt needs new weight. RD to order.  Pt with new pressure wound on anus. RD to order Juven BID to aid in wound healing.  Medications: reviewed; Vitamin C, Vitamin D3, Haldol BID, miralax BID, Senokot BID, zinc sulfate  Labs: reviewed; CBG 82-155 HbA1c: 5.7% (02/27/2021)  Diet Order:   Diet Order             DIET DYS 3 Room service appropriate? Yes; Fluid consistency: Thin  Diet effective now                   EDUCATION NEEDS:  No education needs have been identified at this time  Skin:  Skin Assessment: Skin Integrity Issues: Skin Integrity Issues:: Stage II Stage II: Anus  Last BM:  03/03/21  Height:  Ht Readings from Last 1 Encounters:  02/26/21 6\' 1"  (1.854 m)   Weight:  Wt Readings from Last 1 Encounters:  02/26/21 81.6 kg   BMI:  Body mass index is 23.75 kg/m.  Estimated Nutritional Needs:  Kcal:  2040-2280 kcal Protein:  105-115 grams Fluid:  >/= 2.3 L/day  Derrel Nip, RD, LDN (she/her/hers) Clinical Inpatient Dietitian RD Pager/After-Hours/Weekend Pager # in [place]

## 2021-03-08 NOTE — Progress Notes (Signed)
Progress Note    Erik Lara   VFI:[id number]  DOB: Aug 27, 1935  DOA: 02/26/2021     9 PCP: Dorothyann Peng, NP  Initial CC: Weakness, confusion  Hospital Course: Erik Lara is an 85 yo male with PMH newly diagnosed Lewy body dementia, Parkinson disease, hearing impairment, hepatic cyst, HLD, hypothyroidism, mild neurocognitive disorder, prostatitis, type II DM who presented to [facility] ED from home where he lives with his wife due to a fall, generalized weakness, and confusion x1 day.    Workup revealed Covid-19 viral infection.  CT head/cervical spine were non acute.  UA was equivocal.  He was started on antiviral Paxlovid.  He was not hypoxic. Due to concern for aspiration pneumonia was started on Unasyn on 02/28/2021, switched to Augmentin on 03/02/21.   Hospital course complicated by agitation delirium and new onset A. fib with RVR requiring amiodarone drip and heparin drip for CHA2DS2-VASc of 3.  Transferred to stepdown unit on 02/28/2021 for closer monitoring.  He was able to be weaned off of heparin and amiodarone drips.  Interval History:  No events overnight.   Hematuria has resolved. Foley remains in place.  Mentation still poor and he's having more difficulty staying awake for me in the mornings on rounds and carrying on any conversation.   Assessment & Plan: * COVID-19 virus infection- (present on admission) - no hypoxia  - was originally started on paxlovid but his intake has been poor and he's missed several doses; changed to remdesivir on 12/21 and complete at least 3 day course -3-day course remdesivir completed on 03/05/2021 -7-day course completed of Unasyn followed by Augmentin, okay to discontinue at this time  Atrial fibrillation with RVR ([place]) - No prior known history of similar.  Agree likely precipitated due to underlying COVID-19 infection - Has now converted back to normal sinus rhythm.  He is also having significant amount of hematuria since heparin drip  started; therefore, discontinued heparin drip 12/21 - d/c'd amio on 12/21 as well; remains sinus  Superficial venous thrombosis of arm, left - SVT found in left basilic and left cephalic vein - Continue supportive care with ice or heat as needed, elevation  Hematuria-resolved as of 03/07/2021 - Developed after initiation of heparin drip which was then discontinued -Foley catheter placed on 03/06/2021, hematuria following resolved after this time - Continue Foley for now and will attempt trial of void in 1 to 2 days; but his deconditioning puts him at risk for further urinary retention still  Physical deconditioning - Per son, patient is typically ambulatory at home, using some assistance from a cane at times - Likely deconditioned in setting of prolonged hospitalization and COVID infection - PT/OT eval's: SNF recommended   Hypocalcemia- (present on admission) - Replete as needed  Mild neurocognitive disorder- (present on admission) - Likely due to underlying recent diagnosis of Lewy body dementia - Continue delirium precautions  Normocytic anemia- (present on admission) - Baseline hemoglobin 12 to 13 g/dL - Currently at baseline - Monitor CBC while inpatient  Mild protein malnutrition ([place])- (present on admission) - continue dysphagia diet  Type 2 diabetes mellitus without complication, without long-term current use of insulin ([place]) - Last A1c 5.7% on 02/27/2021 - Continue diet control  Hyperthyroidism- (present on admission) - TSH suppressed on admission, 0.143 - check FT4 (elevated 1.39) -Possibly contributing to precipitating his A. fib - Will need outpatient follow-up for ongoing monitoring - Check thyroid ultrasound: 4 nodules and 2 complex cysts noted (none were recommended for  biopsy or follow up per TI-RADS criteria) - may need TFTs repeated in 4-6 weeks after acute illness resolves as well   Allergic rhinitis- (present on admission) - Continue treatment for  COVID  Hyperlipidemia- (present on admission) - Statin on hold for now    Old records reviewed in assessment of this patient  Antimicrobials: Remdesivir 03/03/2021 >> 12/23 Unasyn 02/28/2021 >> 03/01/2021 Augmentin 03/02/2021 >> 12/23  DVT prophylaxis: Lovenox   Code Status:   Code Status: Full Code  Disposition Plan:  SNF Status is: Inpt  Objective: Blood pressure 132/66, pulse 63, temperature 98.7 F (37.1 C), temperature source Oral, resp. rate 18, height 6\' 1"  (1.854 m), weight 81.6 kg, SpO2 97 %.  Examination:  Physical Exam Constitutional:      General: He is not in acute distress.    Comments: Pleasant chronically ill-appearing elderly gentleman laying in bed in no distress. Speech very difficult to understand and more somnolence   HENT:     Head: Normocephalic and atraumatic.     Mouth/Throat:     Mouth: Mucous membranes are moist.  Eyes:     Extraocular Movements: Extraocular movements intact.  Cardiovascular:     Rate and Rhythm: Normal rate and regular rhythm.     Heart sounds: Normal heart sounds.  Pulmonary:     Comments: No wheezing, coarse breath sounds bilaterally Abdominal:     General: Bowel sounds are normal. There is no distension.     Palpations: Abdomen is soft.     Tenderness: There is no abdominal tenderness.  Genitourinary:    Comments: Urine in Foley bag now clear yellow Musculoskeletal:        General: Normal range of motion.     Cervical back: Normal range of motion and neck supple.  Skin:    General: Skin is warm and dry.  Neurological:     Comments: Slowed mentation; difficulty following commands      Consultants:  Cardiology  Procedures:    Data Reviewed: I have personally reviewed labs and imaging studies    LOS: 9 days  Time spent: Greater than 50% of the 35 minute visit was spent in counseling/coordination of care for the patient as laid out in the A&P.   Dwyane Dee, MD [facility] 03/08/2021, 1:41 PM

## 2021-03-08 NOTE — Progress Notes (Signed)
Physical Therapy Treatment Patient Details Name: Erik Lara MRN: [id number] DOB: 01-Oct-1935 Today's Date: 03/08/2021   History of Present Illness Patient is a 85 year old male who presented to the hosptial after a fall at home with weakness and confusion. patient was found to have COVID 19, a fib with RVR, acute metabolic encephalopathy, and suspected aspiration pneumonia. PMH: lewy body dementia, parkinsons, hearing impairment.    PT Comments    General Comments: eyes closed all of session, able to answer <25% of questions.  He did request some "cold water" and he indicated he was "cold".  Otherwise catatonic. B mittens removed.  Pt unable to hold a cup of water.  Hand held assist to mouth to take a few sips through straw.  Delayed swallow/holding prob due to lethargy state.  Assisted OOB to recliner was difficult.  Supine to sit: Max assist;Total assist;+2 for physical assistance;+2 for safety/equipment.  Poor to no self correction.  Rigid upright while Therapist positioned B feet on floor.  No c/o pain.  VERY groggy but did respond to stimuli.  Assisted to recliner was very difficult.  General transfer comment: pt unable to self rise from elevated so assisted to recliner vis "Bear Hug" 1/4 pivot from elevated bed.  Rec staff use STEDY/LIFT to assist back to bed. Positioned in recliner with multiple pillows.  Assisted with drinking more water per pt request.  Staff is having to assist with feeding pt.   Pt will need ST Rehab at SNF prior to returning home.    Recommendations for follow up therapy are one component of a multi-disciplinary discharge planning process, led by the attending physician.  Recommendations may be updated based on patient status, additional functional criteria and insurance authorization.  Follow Up Recommendations  Skilled nursing-short term rehab (<3 hours/day)     Assistance Recommended at Discharge Frequent or constant Supervision/Assistance  Equipment  Recommendations  None recommended by PT    Recommendations for Other Services       Precautions / Restrictions Precautions Precautions: Fall Precaution Comments: Dx Lewey Body Dementia     Mobility  Bed Mobility Overal bed mobility: Needs Assistance Bed Mobility: Supine to Sit     Supine to sit: Max assist;Total assist;+2 for physical assistance;+2 for safety/equipment     General bed mobility comments: required increased assist to transition to EOB unaware and rigid.    Transfers Overall transfer level: Needs assistance Equipment used: 2 person hand held assist     Stand pivot transfers: Max assist;Total assist         General transfer comment: pt unable to self rise from elevated so assisted to recliner vis "Bear Hug" 1/4 pivot from elevated bed.  Rec staff use STEDY/LIFT to assist back to bed.    Ambulation/Gait               General Gait Details: pt was unable to support hius weight andf unable to take uny functional steps during transfer.   Stairs             Wheelchair Mobility    Modified Rankin (Stroke Patients Only)       Balance                                            Cognition Arousal/Alertness: Lethargic   Overall Cognitive Status: Impaired/Different from baseline  General Comments: eyes closed all of session, able to answer <25% of questions.  He did request some "cold water" and he indicated he was "cold".  Otherwise catatonic.        Exercises      General Comments        Pertinent Vitals/Pain Pain Assessment: No/denies pain    Home Living                          Prior Function            PT Goals (current goals can now be found in the care plan section) Progress towards PT goals: Progressing toward goals    Frequency    Min 2X/week      PT Plan Current plan remains appropriate    Co-evaluation               AM-PAC PT "6 Clicks" Mobility   Outcome Measure  Help needed turning from your back to your side while in a flat bed without using bedrails?: Total Help needed moving from lying on your back to sitting on the side of a flat bed without using bedrails?: Total Help needed moving to and from a bed to a chair (including a wheelchair)?: Total Help needed standing up from a chair using your arms (e.g., wheelchair or bedside chair)?: Total Help needed to walk in hospital room?: Total Help needed climbing 3-5 steps with a railing? : Total 6 Click Score: 6    End of Session Equipment Utilized During Treatment: Gait belt Activity Tolerance: Other (comment) (lethargy) Patient left: in chair;with call bell/phone within reach Nurse Communication: Mobility status PT Visit Diagnosis: Other abnormalities of gait and mobility (R26.89);Muscle weakness (generalized) (M62.81);Difficulty in walking, not elsewhere classified (R26.2)     Time: 1352-1410 PT Time Calculation (min) (ACUTE ONLY): 18 min  Charges:  $Therapeutic Activity: 8-22 mins                     Rica Koyanagi  PTA Acute  Rehabilitation Services Pager      [phone] Office      [phone]

## 2021-03-09 DIAGNOSIS — U071 COVID-19: Secondary | ICD-10-CM | POA: Diagnosis not present

## 2021-03-09 DIAGNOSIS — I4891 Unspecified atrial fibrillation: Secondary | ICD-10-CM | POA: Diagnosis not present

## 2021-03-09 DIAGNOSIS — G3184 Mild cognitive impairment, so stated: Secondary | ICD-10-CM

## 2021-03-09 LAB — GLUCOSE, CAPILLARY
Glucose-Capillary: 81 mg/dL (ref 70–99)
Glucose-Capillary: 107 mg/dL — ABNORMAL HIGH (ref 70–99)
Glucose-Capillary: 143 mg/dL — ABNORMAL HIGH (ref 70–99)
Glucose-Capillary: 84 mg/dL (ref 70–99)

## 2021-03-09 LAB — CBC WITH DIFFERENTIAL/PLATELET
Abs Immature Granulocytes: 0.04 10*3/uL (ref 0.00–0.07)
Basophils Absolute: 0 10*3/uL (ref 0.0–0.1)
Basophils Relative: 0 %
Eosinophils Absolute: 0.1 10*3/uL (ref 0.0–0.5)
Eosinophils Relative: 2 %
HCT: 36.3 % — ABNORMAL LOW (ref 39.0–52.0)
Hemoglobin: 11.7 g/dL — ABNORMAL LOW (ref 13.0–17.0)
Immature Granulocytes: 1 %
Lymphocytes Relative: 12 %
Lymphs Abs: 0.9 10*3/uL (ref 0.7–4.0)
MCH: 30.7 pg (ref 26.0–34.0)
MCHC: 32.2 g/dL (ref 30.0–36.0)
MCV: 95.3 fL (ref 80.0–100.0)
Monocytes Absolute: 0.5 10*3/uL (ref 0.1–1.0)
Monocytes Relative: 7 %
Neutro Abs: 5.5 10*3/uL (ref 1.7–7.7)
Neutrophils Relative %: 78 %
Platelets: 201 10*3/uL (ref 150–400)
RBC: 3.81 MIL/uL — ABNORMAL LOW (ref 4.22–5.81)
RDW: 13.7 % (ref 11.5–15.5)
WBC: 7 10*3/uL (ref 4.0–10.5)
nRBC: 0 % (ref 0.0–0.2)

## 2021-03-09 LAB — BASIC METABOLIC PANEL
Anion gap: 4 — ABNORMAL LOW (ref 5–15)
BUN: 17 mg/dL (ref 8–23)
CO2: 30 mmol/L (ref 22–32)
Calcium: 8.7 mg/dL — ABNORMAL LOW (ref 8.9–10.3)
Chloride: 108 mmol/L (ref 98–111)
Creatinine, Ser: 0.83 mg/dL (ref 0.61–1.24)
GFR, Estimated: >60 mL/min (ref >60)
Glucose, Bld: 120 mg/dL — ABNORMAL HIGH (ref 70–99)
Potassium: 3.5 mmol/L (ref 3.5–5.1)
Sodium: 142 mmol/L (ref 135–145)

## 2021-03-09 LAB — MAGNESIUM: Magnesium: 1.9 mg/dL (ref 1.7–2.4)

## 2021-03-09 NOTE — Care Management Important Message (Signed)
Important Message  Patient Details IM Letter placed in the Patients room. Name: Erik Lara MRN: [id number] Date of Birth: [birth year]   Medicare Important Message Given:  Yes     Kerin Salen 03/09/2021, 12:28 PM

## 2021-03-09 NOTE — Progress Notes (Signed)
Speech Language Pathology Treatment: Dysphagia  Patient Details Name: TORENCE PALMERI MRN: [id number] DOB: [birth year] Today's Date: 03/09/2021 Time: 6438-3779 SLP Time Calculation (min) (ACUTE ONLY): 11 min  Assessment / Plan / Recommendation Clinical Impression  Mr. Weatherholtz is doing better with swallowing, with improved attention to POs and no s/s of aspiration today during our session.  He is able to engage in self-feeding with assist. Consumed multiple boluses of thin water in quick sequence with no s/s of aspiration.  Ate several bites of applesauce with functional oral phase.  Swallowing has stabilized. Recommend advancing diet to regular solids, thin liquids. Continue to give meds whole vs crushed in puree.  Primary barrier is intake, but not a mechanical dysphagia.  No further acute care SLP f/u is warranted. Our service will sign off,.   HPI HPI: Patient is a 85 year old male who presented to the hosptial after a fall at home with weakness and confusion. patient was found to have COVID 19, a fib with RVR, acute metabolic encephalopathy, and suspected aspiration pneumonia. PMH: lewy body dementia, parkinsons, hearing impairment.  Pt has been in the hospital for six days- follow up for swallow function indicated.  Palliative has been following and plan is full code with treatment per notes. RN reports pt tolerating po but with poor intake.      SLP Plan  All goals met      Recommendations for follow up therapy are one component of a multi-disciplinary discharge planning process, led by the attending physician.  Recommendations may be updated based on patient status, additional functional criteria and insurance authorization.    Recommendations  Diet recommendations: regular solids;Thin liquid Liquids provided via: Straw;Cup;Teaspoon Medication Administration: Crushed with puree Supervision: Staff to assist with self feeding Compensations: Minimize environmental distractions                 Oral Care Recommendations: Oral care BID Follow Up Recommendations: Skilled nursing-short term rehab (<3 hours/day) Assistance recommended at discharge: Frequent or constant Supervision/Assistance SLP Visit Diagnosis: Dysphagia, unspecified (R13.10) Plan: All goals met         Orey Moure L. Tivis Ringer, [place] Office number [phone] Pager [phone]   Juan Quam Laurice  03/09/2021, 9:16 AM

## 2021-03-09 NOTE — Progress Notes (Signed)
Occupational Therapy Treatment Patient Details Name: Erik Lara MRN: [id number] DOB: 16-Mar-1935 Today's Date: 03/09/2021   History of present illness Patient is a 85 year old male who presented to the hosptial after a fall at home with weakness and confusion. patient was found to have COVID 19, a fib with RVR, acute metabolic encephalopathy, and suspected aspiration pneumonia. PMH: lewy body dementia, parkinsons, hearing impairment.   OT comments  Patient was noted to have increased need for assistance on this date for oral care than evaluation. Patient was able to hold cup and take small sips of water when requested with noted drool from most of sips. Patient was noted to have increased edema in LUE with pillows positioned to elevate arm and reduce edema. Patient was TD to attempt sitting on edge of bed on this date with strong posterior leaning. Patient's discharge plan remains appropriate at this time. OT will continue to follow acutely. Nurse consulted on patients presentation on this date compared to eval.     Recommendations for follow up therapy are one component of a multi-disciplinary discharge planning process, led by the attending physician.  Recommendations may be updated based on patient status, additional functional criteria and insurance authorization.    Follow Up Recommendations  Skilled nursing-short term rehab (<3 hours/day)    Assistance Recommended at Discharge Frequent or constant Supervision/Assistance  Equipment Recommendations  None recommended by OT    Recommendations for Other Services      Precautions / Restrictions Precautions Precautions: Fall Precaution Comments: Dx Lewey Body Dementia Restrictions Weight Bearing Restrictions: No       Mobility Bed Mobility Overal bed mobility: Needs Assistance             General bed mobility comments: attempted sitting on EOB with TD needed with strong posterior leaning unable to sustain +1 on this  date.    Transfers                         Balance Overall balance assessment: Needs assistance Sitting-balance support: Bilateral upper extremity supported;Feet supported Sitting balance-Leahy Scale: Zero                                     ADL either performed or assessed with clinical judgement   ADL Overall ADL's : Needs assistance/impaired Eating/Feeding: Minimal assistance Eating/Feeding Details (indicate cue type and reason): patient was sitting up in bed with min A to take small sips of water per patient request. patietn was able to get about 3 of 10 sips in mouth swallowed. noted to have increased drool on this date. Grooming: Oral care;Wash/dry face;Moderate assistance Grooming Details (indicate cue type and reason): patient needed hand over hand to attempt to brush teeth with tooth brush. patient was noted to have had decline since eval on participation in brushing teeth. consulted with nursing after session on changes from eval.                                    Extremity/Trunk Assessment Upper Extremity Assessment Upper Extremity Assessment: LUE deficits/detail LUE Deficits / Details: noted to have increased edmea and redness in UE on this date. nursing made aware. h/o DVT in this UE            Vision  Perception     Praxis      Cognition Arousal/Alertness: Lethargic Behavior During Therapy: Flat affect Overall Cognitive Status: Impaired/Different from baseline                                 General Comments: patient was lethargic with increased cues to attend to tasks. noted to have drool coming out of mouth. minimal verbalizations.          Exercises     Shoulder Instructions       General Comments      Pertinent Vitals/ Pain       Pain Assessment: No/denies pain  Home Living                                          Prior Functioning/Environment               Frequency  Min 2X/week        Progress Toward Goals  OT Goals(current goals can now be found in the care plan section)  Progress towards OT goals: Not progressing toward goals - comment     Plan Discharge plan remains appropriate    Co-evaluation                 AM-PAC OT "6 Clicks" Daily Activity     Outcome Measure   Help from another person eating meals?: A Lot Help from another person taking care of personal grooming?: A Lot Help from another person toileting, which includes using toliet, bedpan, or urinal?: Total Help from another person bathing (including washing, rinsing, drying)?: A Lot Help from another person to put on and taking off regular upper body clothing?: A Lot Help from another person to put on and taking off regular lower body clothing?: Total 6 Click Score: 10    End of Session    OT Visit Diagnosis: Unsteadiness on feet (R26.81);Muscle weakness (generalized) (M62.81);Other symptoms and signs involving cognitive function   Activity Tolerance Patient limited by lethargy   Patient Left in bed;with call bell/phone within reach;with bed alarm set   Nurse Communication Other (comment) (OK to see for therapy. updated on participation and changes since last OT session)        Time: 2694-8546 OT Time Calculation (min): 25 min  Charges: OT General Charges $OT Visit: 1 Visit OT Treatments $Self Care/Home Management : 23-37 mins  Jackelyn Poling OTR/L, MS Acute Rehabilitation Department Office# [phone] Pager# [phone]   [name] 03/09/2021, 1:37 PM

## 2021-03-09 NOTE — Progress Notes (Signed)
Progress Note    Erik Lara   TML:[id number]  DOB: 02-12-1936  DOA: 02/26/2021     10 PCP: Dorothyann Peng, NP  Initial CC: Weakness, confusion  Hospital Course: Erik Lara is an 85 yo male with PMH newly diagnosed Lewy body dementia, Parkinson disease, hearing impairment, hepatic cyst, HLD, hypothyroidism, mild neurocognitive disorder, prostatitis, type II DM who presented to [facility] ED from home where he lives with his wife due to a fall, generalized weakness, and confusion x1 day.    Workup revealed Covid-19 viral infection.  CT head/cervical spine were non acute.  UA was equivocal.  He was started on antiviral Paxlovid.  He was not hypoxic. Due to concern for aspiration pneumonia was started on Unasyn on 02/28/2021, switched to Augmentin on 03/02/21.   Hospital course complicated by agitation delirium and new onset A. fib with RVR requiring amiodarone drip and heparin drip for CHA2DS2-VASc of 3.  Transferred to stepdown unit on 02/28/2021 for closer monitoring.  He was able to be weaned off of heparin and amiodarone drips.  Interval History:  No events overnight.   He is actually more awake and alert today than he has been in the past couple days.  Speaking more clearly and following all commands easily. Called and also updated his son, Erik Lara this morning.  He still appears to deconditioned for trial of void and Foley needs to be continued at this time.  Assessment & Plan: * COVID-19 virus infection- (present on admission) - no hypoxia  - was originally started on paxlovid but his intake has been poor and he's missed several doses; changed to remdesivir on 12/21 and complete at least 3 day course -3-day course remdesivir completed on 03/05/2021 -7-day course completed of Unasyn followed by Augmentin, okay to discontinue at this time  Atrial fibrillation with RVR ([place]) - No prior known history of similar.  Agree likely precipitated due to underlying COVID-19 infection - Has  now converted back to normal sinus rhythm.  He is also having significant amount of hematuria since heparin drip started; therefore, discontinued heparin drip 12/21 - d/c'd amio on 12/21 as well; remains sinus  Superficial venous thrombosis of arm, left - SVT found in left basilic and left cephalic vein - Continue supportive care with ice or heat as needed, elevation  Hematuria-resolved as of 03/07/2021 - Developed after initiation of heparin drip which was then discontinued -Foley catheter placed on 03/06/2021, hematuria following resolved after this time - Continue Foley for now and will attempt trial of void in 1 to 2 days; but his deconditioning puts him at risk for further urinary retention still  Physical deconditioning - Per son, patient is typically ambulatory at home, using some assistance from a cane at times - Likely deconditioned in setting of prolonged hospitalization and COVID infection - PT/OT eval's: SNF recommended   Hypocalcemia- (present on admission) - Replete as needed  Mild neurocognitive disorder- (present on admission) - Likely due to underlying recent diagnosis of Lewy body dementia - Continue delirium precautions  Normocytic anemia- (present on admission) - Baseline hemoglobin 12 to 13 g/dL - Currently at baseline - Monitor CBC while inpatient  Mild protein malnutrition ([place])- (present on admission) - continue dysphagia diet  Type 2 diabetes mellitus without complication, without long-term current use of insulin ([place]) - Last A1c 5.7% on 02/27/2021 - Continue diet control  Hyperthyroidism- (present on admission) - TSH suppressed on admission, 0.143 - check FT4 (elevated 1.39) -Possibly contributing to precipitating his A. fib -  Will need outpatient follow-up for ongoing monitoring - Check thyroid ultrasound: 4 nodules and 2 complex cysts noted (none were recommended for biopsy or follow up per TI-RADS criteria) - may need TFTs repeated in 4-6 weeks  after acute illness resolves as well   Allergic rhinitis- (present on admission) - Continue treatment for COVID  Hyperlipidemia- (present on admission) - Statin on hold for now    Old records reviewed in assessment of this patient  Antimicrobials: Remdesivir 03/03/2021 >> 12/23 Unasyn 02/28/2021 >> 03/01/2021 Augmentin 03/02/2021 >> 12/23  DVT prophylaxis: Lovenox   Code Status:   Code Status: Full Code  Disposition Plan:  SNF Status is: Inpt  Objective: Blood pressure (!) 150/58, pulse (!) 51, temperature 98.6 F (37 C), resp. rate 17, height 6\' 1"  (1.854 m), weight 79.4 kg, SpO2 100 %.  Examination:  Physical Exam Constitutional:      General: He is not in acute distress.    Comments: Pleasant chronically ill-appearing elderly gentleman laying in bed in no distress. Speech easier to understand and he is more awake today  HENT:     Head: Normocephalic and atraumatic.     Mouth/Throat:     Mouth: Mucous membranes are moist.  Eyes:     Extraocular Movements: Extraocular movements intact.  Cardiovascular:     Rate and Rhythm: Normal rate and regular rhythm.     Heart sounds: Normal heart sounds.  Pulmonary:     Comments: No wheezing, coarse breath sounds bilaterally Abdominal:     General: Bowel sounds are normal. There is no distension.     Palpations: Abdomen is soft.     Tenderness: There is no abdominal tenderness.  Genitourinary:    Comments: Urine in Foley bag now clear yellow Musculoskeletal:        General: Normal range of motion.     Cervical back: Normal range of motion and neck supple.  Skin:    General: Skin is warm and dry.  Neurological:     Comments: Slowed but improved mentation and following all commands easily     Consultants:  Cardiology  Procedures:    Data Reviewed: I have personally reviewed labs and imaging studies    LOS: 10 days  Time spent: Greater than 50% of the 35 minute visit was spent in counseling/coordination of care  for the patient as laid out in the A&P.   Dwyane Dee, MD [facility] 03/09/2021, 1:22 PM

## 2021-03-10 DIAGNOSIS — R5381 Other malaise: Secondary | ICD-10-CM | POA: Diagnosis not present

## 2021-03-10 DIAGNOSIS — U071 COVID-19: Secondary | ICD-10-CM | POA: Diagnosis not present

## 2021-03-10 DIAGNOSIS — R319 Hematuria, unspecified: Secondary | ICD-10-CM | POA: Diagnosis not present

## 2021-03-10 DIAGNOSIS — G3184 Mild cognitive impairment, so stated: Secondary | ICD-10-CM | POA: Diagnosis not present

## 2021-03-10 LAB — BASIC METABOLIC PANEL
Anion gap: 4 — ABNORMAL LOW (ref 5–15)
BUN: 16 mg/dL (ref 8–23)
CO2: 27 mmol/L (ref 22–32)
Calcium: 8.7 mg/dL — ABNORMAL LOW (ref 8.9–10.3)
Chloride: 108 mmol/L (ref 98–111)
Creatinine, Ser: 0.64 mg/dL (ref 0.61–1.24)
GFR, Estimated: >60 mL/min (ref >60)
Glucose, Bld: 107 mg/dL — ABNORMAL HIGH (ref 70–99)
Potassium: 3.9 mmol/L (ref 3.5–5.1)
Sodium: 139 mmol/L (ref 135–145)

## 2021-03-10 LAB — GLUCOSE, CAPILLARY
Glucose-Capillary: 96 mg/dL (ref 70–99)
Glucose-Capillary: 80 mg/dL (ref 70–99)
Glucose-Capillary: 95 mg/dL (ref 70–99)

## 2021-03-10 LAB — CBC WITH DIFFERENTIAL/PLATELET
Abs Immature Granulocytes: 0.02 10*3/uL (ref 0.00–0.07)
Basophils Absolute: 0 10*3/uL (ref 0.0–0.1)
Basophils Relative: 0 %
Eosinophils Absolute: 0.2 10*3/uL (ref 0.0–0.5)
Eosinophils Relative: 2 %
HCT: 37.6 % — ABNORMAL LOW (ref 39.0–52.0)
Hemoglobin: 12.2 g/dL — ABNORMAL LOW (ref 13.0–17.0)
Immature Granulocytes: 0 %
Lymphocytes Relative: 18 %
Lymphs Abs: 1.3 10*3/uL (ref 0.7–4.0)
MCH: 30.4 pg (ref 26.0–34.0)
MCHC: 32.4 g/dL (ref 30.0–36.0)
MCV: 93.8 fL (ref 80.0–100.0)
Monocytes Absolute: 0.5 10*3/uL (ref 0.1–1.0)
Monocytes Relative: 8 %
Neutro Abs: 4.8 10*3/uL (ref 1.7–7.7)
Neutrophils Relative %: 72 %
Platelets: 210 10*3/uL (ref 150–400)
RBC: 4.01 MIL/uL — ABNORMAL LOW (ref 4.22–5.81)
RDW: 13.6 % (ref 11.5–15.5)
WBC: 6.8 10*3/uL (ref 4.0–10.5)
nRBC: 0 % (ref 0.0–0.2)

## 2021-03-10 LAB — MAGNESIUM: Magnesium: 2 mg/dL (ref 1.7–2.4)

## 2021-03-10 NOTE — Care Management (Signed)
Transition of Care (TOC) -30 day Note       Patient Details   Name: Erik Lara, Erik Lara  Date of Birth:06-08-1935     Transition of Care [facility]) CM/SW Contact   Name:Kizzie Cotten [place]   Phone Number:[phone]  Date:03/10/2021  Time:1:33p     MUST [id number]     To Whom it May Concern:     Please be advised that the above patient will require a short-term nursing home stay, anticipated 30 days or less rehabilitation and strengthening. The plan is for return home.

## 2021-03-10 NOTE — Progress Notes (Signed)
Progress Note    Erik Lara   MOQ:[id number]  DOB: 09-May-1935  DOA: 02/26/2021     11 PCP: Dorothyann Peng, NP  Initial CC: Weakness, confusion  Hospital Course: Erik Lara is an 85 yo male with PMH newly diagnosed Lewy body dementia, Parkinson disease, hearing impairment, hepatic cyst, HLD, hypothyroidism, mild neurocognitive disorder, prostatitis, type II DM who presented to [facility] ED from home where he lives with his wife due to a fall, generalized weakness, and confusion x1 day.    Workup revealed Covid-19 viral infection.  CT head/cervical spine were non acute.  UA was equivocal.  He was started on antiviral Paxlovid.  He was not hypoxic. Due to concern for aspiration pneumonia was started on Unasyn on 02/28/2021, switched to Augmentin on 03/02/21.   Hospital course complicated by agitation delirium and new onset A. fib with RVR requiring amiodarone drip and heparin drip for CHA2DS2-VASc of 3.  Transferred to stepdown unit on 02/28/2021 for closer monitoring.  He was able to be weaned off of heparin and amiodarone drips.  Interval History:  No events overnight.   Awakens easily in bed and able to follow all my commands this morning.  Still extremely weak and deconditioned however.  Assessment & Plan: * COVID-19 virus infection- (present on admission) - no hypoxia  - was originally started on paxlovid but his intake has been poor and he's missed several doses; changed to remdesivir on 12/21 and complete at least 3 day course -3-day course remdesivir completed on 03/05/2021 -7-day course completed of Unasyn followed by Augmentin, okay to discontinue at this time  Atrial fibrillation with RVR ([place]) - No prior known history of similar.  Agree likely precipitated due to underlying COVID-19 infection - Has now converted back to normal sinus rhythm.  He is also having significant amount of hematuria since heparin drip started; therefore, discontinued heparin drip 12/21 - d/c'd amio  on 12/21 as well; remains sinus  Superficial venous thrombosis of arm, left - SVT found in left basilic and left cephalic vein - Continue supportive care with ice or heat as needed, elevation  Hematuria-resolved as of 03/07/2021 - Developed after initiation of heparin drip which was then discontinued -Foley catheter placed on 03/06/2021, hematuria following resolved after this time - Continue Foley for now and possibly will attempt trial of void in 2- 3 days; but his deconditioning puts him at risk for further urinary retention  Physical deconditioning - Per son, patient is typically ambulatory at home, using some assistance from a cane at times - Likely deconditioned in setting of prolonged hospitalization and COVID infection - PT/OT eval's: SNF recommended   Hypocalcemia- (present on admission) - Replete as needed  Hyperthyroidism- (present on admission) - TSH suppressed on admission, 0.143 - check FT4 (elevated 1.39) -Possibly contributing to precipitating his A. fib - Will need outpatient follow-up for ongoing monitoring - Check thyroid ultrasound: 4 nodules and 2 complex cysts noted (none were recommended for biopsy or follow up per TI-RADS criteria) - may need TFTs repeated in 4-6 weeks after acute illness resolves as well   Mild neurocognitive disorder- (present on admission) - Likely due to underlying recent diagnosis of Lewy body dementia - Continue delirium precautions  Normocytic anemia- (present on admission) - Baseline hemoglobin 12 to 13 g/dL - Currently at baseline - Monitor CBC while inpatient  Mild protein malnutrition ([place])- (present on admission) - continue dysphagia diet  Type 2 diabetes mellitus without complication, without long-term current use of insulin ([place]) -  Last A1c 5.7% on 02/27/2021 - Continue diet control  Allergic rhinitis- (present on admission) - Continue treatment for COVID  Hyperlipidemia- (present on admission) - Statin on hold for  now    Old records reviewed in assessment of this patient  Antimicrobials: Remdesivir 03/03/2021 >> 12/23 Unasyn 02/28/2021 >> 03/01/2021 Augmentin 03/02/2021 >> 12/23  DVT prophylaxis: Lovenox   Code Status:   Code Status: Full Code  Disposition Plan:  SNF Status is: Inpt  Objective: Blood pressure (!) 134/57, pulse (!) 53, temperature (!) 97.5 F (36.4 C), temperature source Oral, resp. rate 18, height 6\' 1"  (1.854 m), weight 76.9 kg, SpO2 100 %.  Examination:  Physical Exam Constitutional:      General: He is not in acute distress.    Comments: Pleasant chronically ill-appearing elderly gentleman laying in bed in no distress. Speech easier to understand and he is more awake today  HENT:     Head: Normocephalic and atraumatic.     Mouth/Throat:     Mouth: Mucous membranes are moist.  Eyes:     Extraocular Movements: Extraocular movements intact.  Cardiovascular:     Rate and Rhythm: Normal rate and regular rhythm.     Heart sounds: Normal heart sounds.  Pulmonary:     Comments: No wheezing, coarse breath sounds bilaterally Abdominal:     General: Bowel sounds are normal. There is no distension.     Palpations: Abdomen is soft.     Tenderness: There is no abdominal tenderness.  Genitourinary:    Comments: Urine in Foley bag now clear yellow Musculoskeletal:        General: Normal range of motion.     Cervical back: Normal range of motion and neck supple.  Skin:    General: Skin is warm and dry.  Neurological:     Comments: Slowed but improved mentation and following all commands easily     Consultants:  Cardiology  Procedures:    Data Reviewed: I have personally reviewed labs and imaging studies    LOS: 11 days  Time spent: Greater than 50% of the 35 minute visit was spent in counseling/coordination of care for the patient as laid out in the A&P.   Dwyane Dee, MD [facility] 03/10/2021, 11:30 AM

## 2021-03-10 NOTE — NC FL2 (Addendum)
[place] MEDICAID FL2 LEVEL OF CARE SCREENING TOOL     IDENTIFICATION  Patient Name: Erik Lara Birthdate: 12-17-1935 Sex: male Admission Date (Current Location): 02/26/2021  [facility] and [place] Number:  Herbalist and Address:  [facility],  [place], [place]      Provider Number: [id number]  Attending Physician Name and Address:  Dwyane Dee, MD  Relative Name and Phone Number:  Dorian Pod spouse [phone]    Current Level of Care: Hospital Recommended Level of Care: [place] Prior Approval Number:    Date Approved/Denied:   PASRR Number:    Discharge Plan: SNF    Current Diagnoses: Patient Active Problem List   Diagnosis Date Noted   Pressure injury of skin 03/05/2021   Superficial venous thrombosis of arm, left 03/04/2021   Atrial fibrillation with RVR ([place]) 03/03/2021   Physical deconditioning 03/03/2021   COVID-19 virus infection 02/27/2021   2019 novel coronavirus disease (COVID-19) 02/26/2021   Hypocalcemia 02/26/2021   Mild protein malnutrition ([place]) 02/26/2021   Normocytic anemia 02/26/2021   Hearing loss of right ear 01/23/2017   Mild neurocognitive disorder 09/13/2016   Type 2 diabetes mellitus without complication, without long-term current use of insulin ([place]) 03/30/2015   Hyperthyroidism 01/28/2013   Erectile Dysfunction 02/19/2008   Allergic rhinitis 02/19/2008   Hyperlipidemia 02/15/2007   Hepatic Cyst 02/15/2007    Orientation RESPIRATION BLADDER Height & Weight     Self  Normal Indwelling catheter Weight: 76.9 kg Height:  6\' 1"  (185.4 cm)  BEHAVIORAL SYMPTOMS/MOOD NEUROLOGICAL BOWEL NUTRITION STATUS      Incontinent Diet (regular)  AMBULATORY STATUS COMMUNICATION OF NEEDS Skin   Limited Assist Verbally Normal                       Personal Care Assistance Level of Assistance              Functional Limitations Info  Sight, Hearing, Speech Sight Info: Impaired  (eyeglasses) Hearing Info: Adequate Speech Info: Adequate    SPECIAL CARE FACTORS FREQUENCY  PT (By licensed PT), OT (By licensed OT)     PT Frequency:  (5x week) OT Frequency:  (5x week)            Contractures Contractures Info: Not present    Additional Factors Info  Code Status, Allergies, Psychotropic Code Status Info:  (Full) Allergies Info:  (NKA) Psychotropic Info:  (Namenda;Haldol see MAR)         Current Medications (03/10/2021):  This is the current hospital active medication list Current Facility-Administered Medications  Medication Dose Route Frequency Provider Last Rate Last Admin   0.9 %  sodium chloride infusion   Intravenous PRN Dwyane Dee, MD   Stopped at 03/02/21 0425   acetaminophen (TYLENOL) tablet 650 mg  650 mg Oral Q6H PRN Dwyane Dee, MD   650 mg at 03/05/21 2122   Or   acetaminophen (TYLENOL) suppository 650 mg  650 mg Rectal Q6H PRN Dwyane Dee, MD       albuterol (PROVENTIL) (2.5 MG/3ML) 0.083% nebulizer solution 2.5 mg  2.5 mg Nebulization Q6H PRN Dwyane Dee, MD       ascorbic acid (VITAMIN C) tablet 500 mg  500 mg Oral Daily Dwyane Dee, MD   500 mg at 03/09/21 1028   chlorhexidine (PERIDEX) 0.12 % solution 15 mL  15 mL Mouth Rinse BID Dwyane Dee, MD   15 mL at 03/09/21 2143  Chlorhexidine Gluconate Cloth 2 % PADS 6 each  6 each Topical Daily Dwyane Dee, MD   6 each at 03/09/21 0930   cholecalciferol (VITAMIN D3) tablet 1,000 Units  1,000 Units Oral Daily Dwyane Dee, MD   1,000 Units at 03/09/21 1028   donepezil (ARICEPT) tablet 10 mg  10 mg Oral Standley Brooking, MD   10 mg at 03/09/21 2143   enoxaparin (LOVENOX) injection 40 mg  40 mg Subcutaneous Q24H Dwyane Dee, MD   40 mg at 03/09/21 1503   feeding supplement (ENSURE ENLIVE / ENSURE PLUS) liquid 237 mL  237 mL Oral TID BM Dwyane Dee, MD   237 mL at 03/09/21 2050   guaiFENesin-dextromethorphan (ROBITUSSIN DM) 100-10 MG/5ML syrup 10 mL  10 mL Oral Q4H  PRN Dwyane Dee, MD       haloperidol lactate (HALDOL) injection 2 mg  2 mg Intravenous Q6H PRN Dwyane Dee, MD   2 mg at 03/04/21 1954   hydrALAZINE (APRESOLINE) tablet 25 mg  25 mg Oral Q4H PRN Kathryne Eriksson, NP   25 mg at 03/05/21 1632   lip balm (CARMEX) ointment   Topical PRN Dwyane Dee, MD   Given at 03/02/21 1134   MEDLINE mouth rinse  15 mL Mouth Rinse q12n4p Dwyane Dee, MD   15 mL at 03/09/21 1749   melatonin tablet 3 mg  3 mg Oral QHS Dwyane Dee, MD   3 mg at 03/09/21 2143   memantine (NAMENDA) tablet 10 mg  10 mg Oral BID Dwyane Dee, MD   10 mg at 03/09/21 2143   metoprolol tartrate (LOPRESSOR) injection 2.5 mg  2.5 mg Intravenous Q6H PRN Dwyane Dee, MD   2.5 mg at 03/03/21 7628   multivitamin with minerals tablet 1 tablet  1 tablet Oral Daily Dwyane Dee, MD   1 tablet at 03/09/21 1028   nutrition supplement (JUVEN) (JUVEN) powder packet 1 packet  1 packet Oral BID BM Dwyane Dee, MD   1 packet at 03/09/21 1504   ondansetron (ZOFRAN) tablet 4 mg  4 mg Oral Q6H PRN Dwyane Dee, MD       Or   ondansetron [facility]) injection 4 mg  4 mg Intravenous Q6H PRN Dwyane Dee, MD       polyethylene glycol (MIRALAX / GLYCOLAX) packet 17 g  17 g Oral BID Dwyane Dee, MD   17 g at 03/09/21 1030   senna-docusate (Senokot-S) tablet 1 tablet  1 tablet Oral BID Dwyane Dee, MD   1 tablet at 03/09/21 2143   traZODone (DESYREL) tablet 50 mg  50 mg Oral Standley Brooking, MD   50 mg at 03/09/21 2143   zinc sulfate capsule 220 mg  220 mg Oral Daily Dwyane Dee, MD   220 mg at 03/09/21 1028     Discharge Medications: Please see discharge summary for a list of discharge medications.  Relevant Imaging Results:  Relevant Lab Results:   Psychologist, forensic;Booster x1;SS#[phone], [place] Pulse, Therapist, sports

## 2021-03-10 NOTE — TOC Progression Note (Signed)
Transition of Care [facility]) - Progression Note    Patient Details  Name: Erik Lara MRN: [id number] Date of Birth: 1935/10/13  Transition of [facility]) CM/SW Contact  Zohra Clavel, Juliann Pulse, RN Phone Number: 03/10/2021, 1:36 PM  Clinical Narrative:  Dementia.Spoke to [place] (spouse )-agree to SNF;faxed out-await bed [employer].Will need auth for SNF, & PTAR.    Expected Discharge Plan: Skilled Nursing Facility Barriers to Discharge: Continued Medical Work up  Expected Discharge Plan and Services Expected Discharge Plan: [place]                                               Social Determinants of Health (SDOH) Interventions    Readmission Risk Interventions No flowsheet data found.

## 2021-03-10 NOTE — [place] FL2 (Signed)
[place] MEDICAID FL2 LEVEL OF CARE SCREENING TOOL     IDENTIFICATION  Patient Name: Erik Lara Birthdate: [birth year] Sex: male Admission Date (Current Location): 02/26/2021  [facility] and [place] Number:  Herbalist and Address:  [facility],  [place], [place]      Provider Number: [id number]  Attending Physician Name and Address:  Dwyane Dee, MD  Relative Name and Phone Number:  Dorian Pod spouse [phone]    Current Level of Care: Hospital Recommended Level of Care: [place] Prior Approval Number:    Date Approved/Denied:   PASRR Number:    Discharge Plan: SNF    Current Diagnoses: Patient Active Problem List   Diagnosis Date Noted   Pressure injury of skin 03/05/2021   Superficial venous thrombosis of arm, left 03/04/2021   Atrial fibrillation with RVR ([place]) 03/03/2021   Physical deconditioning 03/03/2021   COVID-19 virus infection 02/27/2021   2019 novel coronavirus disease (COVID-19) 02/26/2021   Hypocalcemia 02/26/2021   Mild protein malnutrition ([place]) 02/26/2021   Normocytic anemia 02/26/2021   Hearing loss of right ear 01/23/2017   Mild neurocognitive disorder 09/13/2016   Type 2 diabetes mellitus without complication, without long-term current use of insulin ([place]) 03/30/2015   Hyperthyroidism 01/28/2013   Erectile Dysfunction 02/19/2008   Allergic rhinitis 02/19/2008   Hyperlipidemia 02/15/2007   Hepatic Cyst 02/15/2007    Orientation RESPIRATION BLADDER Height & Weight     Self  Normal Indwelling catheter Weight: 76.9 kg Height:  6\' 1"  (185.4 cm)  BEHAVIORAL SYMPTOMS/MOOD NEUROLOGICAL BOWEL NUTRITION STATUS      Incontinent Diet (regular)  AMBULATORY STATUS COMMUNICATION OF NEEDS Skin   Limited Assist Verbally Normal                       Personal Care Assistance Level of Assistance              Functional Limitations Info  Sight, Hearing, Speech Sight Info: Impaired  (eyeglasses) Hearing Info: Adequate Speech Info: Adequate    SPECIAL CARE FACTORS FREQUENCY  PT (By licensed PT), OT (By licensed OT)     PT Frequency:  (5x week) OT Frequency:  (5x week)            Contractures Contractures Info: Not present    Additional Factors Info  Code Status, Allergies, Psychotropic Code Status Info:  (Full) Allergies Info:  (NKA) Psychotropic Info:  (Namenda;Haldol see MAR)         Current Medications (03/10/2021):  This is the current hospital active medication list Current Facility-Administered Medications  Medication Dose Route Frequency Provider Last Rate Last Admin   0.9 %  sodium chloride infusion   Intravenous PRN Dwyane Dee, MD   Stopped at 03/02/21 0425   acetaminophen (TYLENOL) tablet 650 mg  650 mg Oral Q6H PRN Dwyane Dee, MD   650 mg at 03/05/21 2122   Or   acetaminophen (TYLENOL) suppository 650 mg  650 mg Rectal Q6H PRN Dwyane Dee, MD       albuterol (PROVENTIL) (2.5 MG/3ML) 0.083% nebulizer solution 2.5 mg  2.5 mg Nebulization Q6H PRN Dwyane Dee, MD       ascorbic acid (VITAMIN C) tablet 500 mg  500 mg Oral Daily Dwyane Dee, MD   500 mg at 03/09/21 1028   chlorhexidine (PERIDEX) 0.12 % solution 15 mL  15 mL Mouth Rinse BID Dwyane Dee, MD   15 mL at 03/09/21 2143  Chlorhexidine Gluconate Cloth 2 % PADS 6 each  6 each Topical Daily Dwyane Dee, MD   6 each at 03/09/21 0930   cholecalciferol (VITAMIN D3) tablet 1,000 Units  1,000 Units Oral Daily Dwyane Dee, MD   1,000 Units at 03/09/21 1028   donepezil (ARICEPT) tablet 10 mg  10 mg Oral Standley Brooking, MD   10 mg at 03/09/21 2143   enoxaparin (LOVENOX) injection 40 mg  40 mg Subcutaneous Q24H Dwyane Dee, MD   40 mg at 03/09/21 1503   feeding supplement (ENSURE ENLIVE / ENSURE PLUS) liquid 237 mL  237 mL Oral TID BM Dwyane Dee, MD   237 mL at 03/09/21 2050   guaiFENesin-dextromethorphan (ROBITUSSIN DM) 100-10 MG/5ML syrup 10 mL  10 mL Oral Q4H  PRN Dwyane Dee, MD       haloperidol lactate (HALDOL) injection 2 mg  2 mg Intravenous Q6H PRN Dwyane Dee, MD   2 mg at 03/04/21 1954   hydrALAZINE (APRESOLINE) tablet 25 mg  25 mg Oral Q4H PRN Kathryne Eriksson, NP   25 mg at 03/05/21 1632   lip balm (CARMEX) ointment   Topical PRN Dwyane Dee, MD   Given at 03/02/21 1134   MEDLINE mouth rinse  15 mL Mouth Rinse q12n4p Dwyane Dee, MD   15 mL at 03/09/21 1749   melatonin tablet 3 mg  3 mg Oral QHS Dwyane Dee, MD   3 mg at 03/09/21 2143   memantine (NAMENDA) tablet 10 mg  10 mg Oral BID Dwyane Dee, MD   10 mg at 03/09/21 2143   metoprolol tartrate (LOPRESSOR) injection 2.5 mg  2.5 mg Intravenous Q6H PRN Dwyane Dee, MD   2.5 mg at 03/03/21 3086   multivitamin with minerals tablet 1 tablet  1 tablet Oral Daily Dwyane Dee, MD   1 tablet at 03/09/21 1028   nutrition supplement (JUVEN) (JUVEN) powder packet 1 packet  1 packet Oral BID BM Dwyane Dee, MD   1 packet at 03/09/21 1504   ondansetron (ZOFRAN) tablet 4 mg  4 mg Oral Q6H PRN Dwyane Dee, MD       Or   ondansetron [facility]) injection 4 mg  4 mg Intravenous Q6H PRN Dwyane Dee, MD       polyethylene glycol (MIRALAX / GLYCOLAX) packet 17 g  17 g Oral BID Dwyane Dee, MD   17 g at 03/09/21 1030   senna-docusate (Senokot-S) tablet 1 tablet  1 tablet Oral BID Dwyane Dee, MD   1 tablet at 03/09/21 2143   traZODone (DESYREL) tablet 50 mg  50 mg Oral Standley Brooking, MD   50 mg at 03/09/21 2143   zinc sulfate capsule 220 mg  220 mg Oral Daily Dwyane Dee, MD   220 mg at 03/09/21 1028     Discharge Medications: Please see discharge summary for a list of discharge medications.  Relevant Imaging Results:  Relevant Lab Results:   Psychologist, forensic;Booster x1;SS#[phone] Juliann Pulse, Therapist, sports

## 2021-03-11 DIAGNOSIS — E0590 Thyrotoxicosis, unspecified without thyrotoxic crisis or storm: Secondary | ICD-10-CM

## 2021-03-11 DIAGNOSIS — E785 Hyperlipidemia, unspecified: Secondary | ICD-10-CM

## 2021-03-11 DIAGNOSIS — I4891 Unspecified atrial fibrillation: Secondary | ICD-10-CM | POA: Diagnosis not present

## 2021-03-11 DIAGNOSIS — R531 Weakness: Secondary | ICD-10-CM

## 2021-03-11 DIAGNOSIS — R7989 Other specified abnormal findings of blood chemistry: Secondary | ICD-10-CM | POA: Diagnosis not present

## 2021-03-11 DIAGNOSIS — J309 Allergic rhinitis, unspecified: Secondary | ICD-10-CM

## 2021-03-11 DIAGNOSIS — I82612 Acute embolism and thrombosis of superficial veins of left upper extremity: Secondary | ICD-10-CM

## 2021-03-11 DIAGNOSIS — U071 COVID-19: Secondary | ICD-10-CM | POA: Diagnosis not present

## 2021-03-11 DIAGNOSIS — D649 Anemia, unspecified: Secondary | ICD-10-CM

## 2021-03-11 DIAGNOSIS — E119 Type 2 diabetes mellitus without complications: Secondary | ICD-10-CM

## 2021-03-11 LAB — CBC WITH DIFFERENTIAL/PLATELET
Abs Immature Granulocytes: 0.03 10*3/uL (ref 0.00–0.07)
Basophils Absolute: 0 10*3/uL (ref 0.0–0.1)
Basophils Relative: 0 %
Eosinophils Absolute: 0.1 10*3/uL (ref 0.0–0.5)
Eosinophils Relative: 2 %
HCT: 38.1 % — ABNORMAL LOW (ref 39.0–52.0)
Hemoglobin: 12.3 g/dL — ABNORMAL LOW (ref 13.0–17.0)
Immature Granulocytes: 0 %
Lymphocytes Relative: 17 %
Lymphs Abs: 1.2 10*3/uL (ref 0.7–4.0)
MCH: 30.2 pg (ref 26.0–34.0)
MCHC: 32.3 g/dL (ref 30.0–36.0)
MCV: 93.6 fL (ref 80.0–100.0)
Monocytes Absolute: 0.6 10*3/uL (ref 0.1–1.0)
Monocytes Relative: 8 %
Neutro Abs: 5.3 10*3/uL (ref 1.7–7.7)
Neutrophils Relative %: 73 %
Platelets: 187 10*3/uL (ref 150–400)
RBC: 4.07 MIL/uL — ABNORMAL LOW (ref 4.22–5.81)
RDW: 13.2 % (ref 11.5–15.5)
WBC: 7.3 10*3/uL (ref 4.0–10.5)
nRBC: 0 % (ref 0.0–0.2)

## 2021-03-11 LAB — BASIC METABOLIC PANEL
Anion gap: 6 (ref 5–15)
BUN: 12 mg/dL (ref 8–23)
CO2: 26 mmol/L (ref 22–32)
Calcium: 8.7 mg/dL — ABNORMAL LOW (ref 8.9–10.3)
Chloride: 105 mmol/L (ref 98–111)
Creatinine, Ser: 0.58 mg/dL — ABNORMAL LOW (ref 0.61–1.24)
GFR, Estimated: >60 mL/min (ref >60)
Glucose, Bld: 85 mg/dL (ref 70–99)
Potassium: 3.5 mmol/L (ref 3.5–5.1)
Sodium: 137 mmol/L (ref 135–145)

## 2021-03-11 LAB — GLUCOSE, CAPILLARY
Glucose-Capillary: 83 mg/dL (ref 70–99)
Glucose-Capillary: 106 mg/dL — ABNORMAL HIGH (ref 70–99)
Glucose-Capillary: 108 mg/dL — ABNORMAL HIGH (ref 70–99)
Glucose-Capillary: 110 mg/dL — ABNORMAL HIGH (ref 70–99)

## 2021-03-11 LAB — MAGNESIUM: Magnesium: 1.9 mg/dL (ref 1.7–2.4)

## 2021-03-11 NOTE — NC FL2 (Signed)
[place] MEDICAID FL2 LEVEL OF CARE SCREENING TOOL     IDENTIFICATION  Patient Name: Erik Lara Birthdate: September 11, 1935 Sex: male Admission Date (Current Location): 02/26/2021  [facility] and [place] Number:  Herbalist and Address:  [facility],  [place], [place]      Provider Number: [id number]  Attending Physician Name and Address:  Eugenie Filler, MD  Relative Name and Phone Number:  Dorian Pod spouse [phone]    Current Level of Care: Hospital Recommended Level of Care: [place] Prior Approval Number:    Date Approved/Denied:   PASRR Number: [id number]  Discharge Plan: SNF    Current Diagnoses: Patient Active Problem List   Diagnosis Date Noted   Pressure injury of skin 03/05/2021   Superficial venous thrombosis of arm, left 03/04/2021   Atrial fibrillation with RVR ([place]) 03/03/2021   Physical deconditioning 03/03/2021   COVID-19 virus infection 02/27/2021   2019 novel coronavirus disease (COVID-19) 02/26/2021   Hypocalcemia 02/26/2021   Mild protein malnutrition ([place]) 02/26/2021   Normocytic anemia 02/26/2021   Hearing loss of right ear 01/23/2017   Mild neurocognitive disorder 09/13/2016   Type 2 diabetes mellitus without complication, without long-term current use of insulin ([place]) 03/30/2015   Hyperthyroidism 01/28/2013   Erectile Dysfunction 02/19/2008   Allergic rhinitis 02/19/2008   Hyperlipidemia 02/15/2007   Hepatic Cyst 02/15/2007    Orientation RESPIRATION BLADDER Height & Weight     Self  Normal Indwelling catheter Weight: 76.9 kg Height:  6\' 1"  (185.4 cm)  BEHAVIORAL SYMPTOMS/MOOD NEUROLOGICAL BOWEL NUTRITION STATUS      Incontinent Diet (regular)  AMBULATORY STATUS COMMUNICATION OF NEEDS Skin   Limited Assist Verbally Normal                       Personal Care Assistance Level of Assistance              Functional Limitations Info  Sight, Hearing, Speech Sight  Info: Impaired (eyeglasses) Hearing Info: Adequate Speech Info: Adequate    SPECIAL CARE FACTORS FREQUENCY  PT (By licensed PT), OT (By licensed OT)     PT Frequency:  (5x week) OT Frequency:  (5x week)            Contractures Contractures Info: Not present    Additional Factors Info  Code Status, Allergies, Psychotropic Code Status Info:  (Full) Allergies Info:  (NKA) Psychotropic Info:  (Namenda;Haldol see MAR)         Current Medications (03/11/2021):  This is the current hospital active medication list Current Facility-Administered Medications  Medication Dose Route Frequency Provider Last Rate Last Admin   0.9 %  sodium chloride infusion   Intravenous PRN Dwyane Dee, MD   Stopped at 03/02/21 0425   acetaminophen (TYLENOL) tablet 650 mg  650 mg Oral Q6H PRN Dwyane Dee, MD   650 mg at 03/05/21 2122   Or   acetaminophen (TYLENOL) suppository 650 mg  650 mg Rectal Q6H PRN Dwyane Dee, MD       albuterol (PROVENTIL) (2.5 MG/3ML) 0.083% nebulizer solution 2.5 mg  2.5 mg Nebulization Q6H PRN Dwyane Dee, MD       ascorbic acid (VITAMIN C) tablet 500 mg  500 mg Oral Daily Dwyane Dee, MD   500 mg at 03/11/21 1014   chlorhexidine (PERIDEX) 0.12 % solution 15 mL  15 mL Mouth Rinse BID Dwyane Dee, MD   15 mL at 03/11/21 1014  Chlorhexidine Gluconate Cloth 2 % PADS 6 each  6 each Topical Daily Dwyane Dee, MD   6 each at 03/11/21 1014   cholecalciferol (VITAMIN D3) tablet 1,000 Units  1,000 Units Oral Daily Dwyane Dee, MD   1,000 Units at 03/11/21 1019   donepezil (ARICEPT) tablet 10 mg  10 mg Oral Standley Brooking, MD   10 mg at 03/10/21 2136   enoxaparin (LOVENOX) injection 40 mg  40 mg Subcutaneous Q24H Dwyane Dee, MD   40 mg at 03/11/21 1209   feeding supplement (ENSURE ENLIVE / ENSURE PLUS) liquid 237 mL  237 mL Oral TID BM Dwyane Dee, MD   237 mL at 03/11/21 1410   guaiFENesin-dextromethorphan (ROBITUSSIN DM) 100-10 MG/5ML syrup 10 mL   10 mL Oral Q4H PRN Dwyane Dee, MD       haloperidol lactate (HALDOL) injection 2 mg  2 mg Intravenous Q6H PRN Dwyane Dee, MD   2 mg at 03/04/21 1954   hydrALAZINE (APRESOLINE) tablet 25 mg  25 mg Oral Q4H PRN Kathryne Eriksson, NP   25 mg at 03/05/21 1632   lip balm (CARMEX) ointment   Topical PRN Dwyane Dee, MD   Given at 03/02/21 1134   MEDLINE mouth rinse  15 mL Mouth Rinse q12n4p Dwyane Dee, MD   15 mL at 03/11/21 1210   melatonin tablet 3 mg  3 mg Oral QHS Dwyane Dee, MD   3 mg at 03/10/21 2135   memantine (NAMENDA) tablet 10 mg  10 mg Oral BID Dwyane Dee, MD   10 mg at 03/11/21 1014   metoprolol tartrate (LOPRESSOR) injection 2.5 mg  2.5 mg Intravenous Q6H PRN Dwyane Dee, MD   2.5 mg at 03/03/21 0307   multivitamin with minerals tablet 1 tablet  1 tablet Oral Daily Dwyane Dee, MD   1 tablet at 03/11/21 1014   nutrition supplement (JUVEN) (JUVEN) powder packet 1 packet  1 packet Oral BID BM Dwyane Dee, MD   1 packet at 03/11/21 1410   ondansetron (ZOFRAN) tablet 4 mg  4 mg Oral Q6H PRN Dwyane Dee, MD       Or   ondansetron [facility]) injection 4 mg  4 mg Intravenous Q6H PRN Dwyane Dee, MD       polyethylene glycol (MIRALAX / GLYCOLAX) packet 17 g  17 g Oral BID Dwyane Dee, MD   17 g at 03/11/21 1014   senna-docusate (Senokot-S) tablet 1 tablet  1 tablet Oral BID Dwyane Dee, MD   1 tablet at 03/11/21 1020   traZODone (DESYREL) tablet 50 mg  50 mg Oral Standley Brooking, MD   50 mg at 03/10/21 2135   zinc sulfate capsule 220 mg  220 mg Oral Daily Dwyane Dee, MD   220 mg at 03/11/21 1014     Discharge Medications: Please see discharge summary for a list of discharge medications.  Relevant Imaging Results:  Relevant Lab Results:   Psychologist, forensic;Booster x1;SS#[phone], Juliann Pulse, Therapist, sports

## 2021-03-11 NOTE — Progress Notes (Signed)
PROGRESS NOTE    LATREL SZYMCZAK  OFB:[id number] DOB: 06/16/1935 DOA: 02/26/2021 PCP: Dorothyann Peng, NP (Confirm with patient/family/NH records and if not entered, this HAS to be entered at [facility] point of entry. "No PCP" if truly none.)   Chief Complaint  Patient presents with   Fall    Brief Narrative:  Mr. Amborn is an 85 yo male with PMH newly diagnosed Lewy body dementia, Parkinson disease, hearing impairment, hepatic cyst, HLD, hypothyroidism, mild neurocognitive disorder, prostatitis, type II DM who presented to [facility] ED from home where he lives with his wife due to a fall, generalized weakness, and confusion x1 day.     Workup revealed Covid-19 viral infection.  CT head/cervical spine were non acute.  UA was equivocal.  He was started on antiviral Paxlovid.  He was not hypoxic. Due to concern for aspiration pneumonia was started on Unasyn on 02/28/2021, switched to Augmentin on 03/02/21.   Hospital course complicated by agitation delirium and new onset A. fib with RVR requiring amiodarone drip and heparin drip for CHA2DS2-VASc of 3.  Transferred to stepdown unit on 02/28/2021 for closer monitoring.  He was able to be weaned off of heparin and amiodarone drips.   Assessment & Plan:   Principal Problem:   COVID-19 virus infection Active Problems:   Hyperlipidemia   Allergic rhinitis   Hyperthyroidism   Type 2 diabetes mellitus without complication, without long-term current use of insulin (HCC)   Mild neurocognitive disorder   Hypocalcemia   Mild protein malnutrition (HCC)   Normocytic anemia   Atrial fibrillation with RVR (HCC)   Physical deconditioning   Superficial venous thrombosis of arm, left   Pressure injury of skin  #1 COVID-19 virus infection, POA -Patient with no noted hypoxia with sats of 99-100% on room air. -Initially started on Paxil over but due to poor oral intake missed several doses and subsequently transition to IV remdesivir on 03/03/2021 and  completed 3-day course of treatment on 03/05/2021. -Status post 7 days of Unasyn followed by Augmentin. -Antibiotic course completed.  2.  New onset A. fib with RVR -No prior history noted. -Likely precipitated by underlying COVID-19 infection. -Patient back in normal sinus rhythm and noted to have significant hematuria while on heparin drip which was subsequently discontinued on 03/03/2021. -Amiodarone discontinued 03/03/2021 patient currently in sinus rhythm. -Some hematuria noted in Foley bag per RN this morning.  3.  Superficial vein thrombosis left upper extremity -SVT noted in left basilic and left cephalic vein.  Doppler ultrasound. -Supportive care with ice/heat as needed, elevation.  4.  Hematuria- -Patient noted to have hematuria developed after initiation of heparin drip which was subsequently discontinued. -Foley catheter placed 03/06/2021, hematuria following subsequently resolved. -Patient noted to have some hematuria today concerned that patient may have pulled on Foley catheter. -Irrigate Foley catheter every 4-6 hours. -Once hematuria is resolved may consider voiding trial in the next 2 to 3 days however deconditioning likely places patient at increased risk for urinary retention. -Follow.  5.  Physical deconditioning -Per Dr.Girguis, patient son noted patient typically ambulatory at home with some assistance from cane occasionally. -Patient deconditioning in the setting of prolonged hospitalization COVID-19 infection. -Seen by PT/OT who are recommending SNF placement. -TOC following.  6.  Hypocalcemia -Replete.  7.  Abnormal TSH versus hyperthyroidism, POA -TSH suppressed on admission at 0.143. -Free T4 noted elevated at 1.39. -Thyroid ultrasound with 4 nodules in 2 complexes noted none recommended for biopsy on follow-up per TI-RADS criteria. -We  will need repeat thyroid function studies in about 4 to 6 weeks after acute illness has resolved in the outpatient  setting.  8.  Mild neurocognitive disorder -Likely secondary to recent diagnosis of Lewy body dementia. -Delirium precautions. -Supportive care. -Outpatient follow-up.  9.  Normocytic anemia -H&H stable.  10.  Well-controlled type 2 diabetes mellitus -Hemoglobin A1c 5.7% on 02/27/2021 -Continue diet control.  11.  Allergic rhinitis -Stable.  12.  Hyperlipidemia -Statin on hold. -Could likely resume on discharge.   DVT prophylaxis: Lovenox Code Status: Full Family Communication: No family at bedside. Disposition:   Status is: Inpatient  Remains inpatient appropriate because: Unsafe disposition     Consultants:  Palliative care: Dr. Rowe Pavy 03/03/2021 Cardiology: Dr. Sallyanne Kuster 02/28/2021  Procedures:  CT head CT C-spine 02/26/2021 Chest x-ray 02/26/2021, 02/28/2021, 03/05/2021 2D echo 02/28/2021 Upper extremity ultrasound 03/04/2021   Antimicrobials:  Anti-infectives (From admission, onward)    Start     Dose/Rate Route Frequency Ordered Stop   03/04/21 1000  remdesivir 100 mg in sodium chloride 0.9 % 100 mL IVPB        100 mg 200 mL/hr over 30 Minutes Intravenous Daily 03/03/21 0810 03/05/21 1053   03/03/21 1000  remdesivir 200 mg in sodium chloride 0.9% 250 mL IVPB        200 mg 580 mL/hr over 30 Minutes Intravenous Once 03/03/21 0810 03/03/21 1011   03/02/21 2200  amoxicillin-clavulanate (AUGMENTIN) 875-125 MG per tablet 1 tablet  Status:  Discontinued        1 tablet Oral Every 12 hours 03/02/21 1535 03/02/21 1541   03/02/21 1600  amoxicillin-clavulanate (AUGMENTIN) 875-125 MG per tablet 1 tablet        1 tablet Oral Every 12 hours 03/02/21 1541 03/05/21 1016   03/01/21 2200  Ampicillin-Sulbactam (UNASYN) 3 g in sodium chloride 0.9 % 100 mL IVPB  Status:  Discontinued        3 g 200 mL/hr over 30 Minutes Intravenous Every 6 hours 03/01/21 1701 03/02/21 1535   02/28/21 1200  Ampicillin-Sulbactam (UNASYN) 3 g in sodium chloride 0.9 % 100 mL IVPB  Status:   Discontinued        3 g 200 mL/hr over 30 Minutes Intravenous Every 6 hours 02/28/21 1114 03/01/21 1701   02/27/21 1000  remdesivir 100 mg in sodium chloride 0.9 % 100 mL IVPB  Status:  Discontinued       See Hyperspace for full Linked Orders Report.   100 mg 200 mL/hr over 30 Minutes Intravenous Daily 02/26/21 1328 02/26/21 1329   02/27/21 1000  remdesivir 100 mg in sodium chloride 0.9 % 100 mL IVPB  Status:  Discontinued       See Hyperspace for full Linked Orders Report.   100 mg 200 mL/hr over 30 Minutes Intravenous Daily 02/26/21 1331 02/26/21 1357   02/26/21 1600  nirmatrelvir/ritonavir EUA (PAXLOVID) 3 tablet  Status:  Discontinued        3 tablet Oral 2 times daily 02/26/21 1359 03/03/21 0809   02/26/21 1400  remdesivir 100 mg in sodium chloride 0.9 % 100 mL IVPB  Status:  Discontinued       See Hyperspace for full Linked Orders Report.   100 mg 200 mL/hr over 30 Minutes Intravenous  Once 02/26/21 1331 02/26/21 1357   02/26/21 1345  remdesivir 100 mg in sodium chloride 0.9 % 100 mL IVPB  Status:  Discontinued       See Hyperspace for full Linked Orders Report.  100 mg 200 mL/hr over 30 Minutes Intravenous  Once 02/26/21 1331 02/26/21 1357   02/26/21 1330  remdesivir 200 mg in sodium chloride 0.9% 250 mL IVPB  Status:  Discontinued       See Hyperspace for full Linked Orders Report.   200 mg 580 mL/hr over 30 Minutes Intravenous Once 02/26/21 1328 02/26/21 1329         Subjective: Sleeping but arousable.  Patient noted per RN to have some bloody urine in Foley catheter  Objective: Vitals:   03/10/21 1700 03/10/21 2012 03/11/21 0517 03/11/21 1347  BP: (!) 132/57 137/72 (!) 144/60 114/60  Pulse: (!) 59 63 (!) 57 (!) 59  Resp: 16 18 14 14   Temp: 98.2 F (36.8 C) 97.7 F (36.5 C) 98.1 F (36.7 C) 98 F (36.7 C)  TempSrc: Axillary Oral  Oral  SpO2: 99% 99% 100% 99%  Weight:      Height:        Intake/Output Summary (Last 24 hours) at 03/11/2021 2003 Last data  filed at 03/11/2021 1800 Gross per 24 hour  Intake 480 ml  Output 1700 ml  Net -1220 ml   Filed Weights   02/26/21 1214 03/09/21 0500 03/10/21 0500  Weight: 81.6 kg 79.4 kg 76.9 kg    Examination:  General exam: Appears calm and comfortable  Respiratory system: Clear to auscultation anterior lung fields.Marland Kitchen Respiratory effort normal. Cardiovascular system: S1 & S2 heard, RRR. No JVD, murmurs, rubs, gallops or clicks. No pedal edema. Gastrointestinal system: Abdomen is nondistended, soft and nontender. No organomegaly or masses felt. Normal bowel sounds heard. Central nervous system: Alert and oriented. No focal neurological deficits. Extremities: Symmetric 5 x 5 power. Skin: No rashes, lesions or ulcers Psychiatry: Judgement and insight appear poor. Mood & affect appropriate.     Data Reviewed: I have personally reviewed following labs and imaging studies  CBC: Recent Labs  Lab 03/07/21 0519 03/08/21 0453 03/09/21 0901 03/10/21 0453 03/11/21 0441  WBC 5.4 5.8 7.0 6.8 7.3  NEUTROABS 3.7 3.9 5.5 4.8 5.3  HGB 10.9* 10.6* 11.7* 12.2* 12.3*  HCT 33.6* 33.2* 36.3* 37.6* 38.1*  MCV 92.3 94.6 95.3 93.8 93.6  PLT 190 205 201 210 673    Basic Metabolic Panel: Recent Labs  Lab 03/07/21 0519 03/08/21 0453 03/09/21 0901 03/10/21 0453 03/11/21 0441  NA 143 144 142 139 137  K 3.3* 3.7 3.5 3.9 3.5  CL 113* 112* 108 108 105  CO2 27 29 30 27 26   GLUCOSE 119* 103* 120* 107* 85  BUN 20 19 17 16 12   CREATININE 0.76 0.71 0.83 0.64 0.58*  CALCIUM 8.8* 8.7* 8.7* 8.7* 8.7*  MG 2.0 2.0 1.9 2.0 1.9    GFR: Estimated Creatinine Clearance: 76.1 mL/min (A) (by C-G formula based on SCr of 0.58 mg/dL (L)).  Liver Function Tests: Recent Labs  Lab 03/05/21 0246 03/06/21 0451 03/07/21 0519 03/08/21 0453  AST 28 28 22 19   ALT 33 36 31 27  ALKPHOS 46 50 47 45  BILITOT 1.2 1.2 1.1 1.0  PROT 5.3* 5.3* 5.1* 4.9*  ALBUMIN 2.6* 2.4* 2.4* 2.2*    CBG: Recent Labs  Lab  03/10/21 1709 03/10/21 2031 03/11/21 0734 03/11/21 1133 03/11/21 1640  GLUCAP 80 95 83 106* 108*     No results found for this or any previous visit (from the past 240 hour(s)).       Radiology Studies: No results found.      Scheduled Meds:  vitamin C  500 mg Oral Daily   chlorhexidine  15 mL Mouth Rinse BID   Chlorhexidine Gluconate Cloth  6 each Topical Daily   cholecalciferol  1,000 Units Oral Daily   donepezil  10 mg Oral QHS   enoxaparin (LOVENOX) injection  40 mg Subcutaneous Q24H   feeding supplement  237 mL Oral TID BM   mouth rinse  15 mL Mouth Rinse q12n4p   melatonin  3 mg Oral QHS   memantine  10 mg Oral BID   multivitamin with minerals  1 tablet Oral Daily   nutrition supplement (JUVEN)  1 packet Oral BID BM   polyethylene glycol  17 g Oral BID   senna-docusate  1 tablet Oral BID   traZODone  50 mg Oral QHS   zinc sulfate  220 mg Oral Daily   Continuous Infusions:  sodium chloride Stopped (03/02/21 0425)     LOS: 12 days    Time spent: 35 minutes    Irine Seal, MD [facility]   To contact the attending provider between 7A-7P or the covering provider during after hours 7P-7A, please log into the web site [email or web address] and access using universal [place] password for that web site. If you do not have the password, please call the hospital operator.  03/11/2021, 8:03 PM

## 2021-03-11 NOTE — TOC Progression Note (Signed)
Transition of Care [facility]) - Progression Note    Patient Details  Name: Erik Lara MRN: [id number] Date of Birth: June 28, 1935  Transition of Care [facility]) CM/SW Contact  Bush Murdoch, Juliann Pulse, RN Phone Number: 03/11/2021, 3:38 PM  Clinical Narrative:   Left vm w/Ellen, & list of bed offers left in rm--await bed choice prior starting auth.   1. 1.3 mi [facility] A [facility] and Craig [place], [place] [address] [phone] Overall rating Above average 2. 1.6 mi [facility] at [place], [place] [address] [phone] Overall rating Much below average 3. 2.1 mi [place], [place] [address] [phone] Overall rating Much below average 4. 2.5 mi [facility] [place], [place] [address] [phone] Overall rating Below average 5. 2.8 mi [facility] at the [place], [place] [address] [phone] Overall rating Average 6. 2.8 mi [place] [address] [place], [place] [address] [phone] Overall rating Much below average 7. 3 mi [facility] [place], [place] [address] [phone] Overall rating Above average 8. 3.6 [place] [address] [place], [place] [address] [phone] Overall rating Average 9. 3.6 mi [facility] [address], [place] [address] [phone] Overall rating Much below average 10. 3.9 mi [facility] [place], [place] [address] [phone] Overall rating Much below average 11. 4.4 mi [facility] at [place], [place] [address] [phone] Overall rating Much above average 12. 4.6  mi [facility] [address] [place], [place] [address] [phone] Overall rating Much above average 13. 5.5 mi [facility] [address] [place], [place] [address] [phone] Overall rating Above average 14. 8.2 [facility] [place], [place] [address] [phone] Overall rating Much above average 15. 9 mi [facility] [address] [place], [place] [address] [phone] Overall rating Below average 16. 9.1 [place] and Nelson Mathews [place], [place] [address] [phone] Overall rating Much below average 17. 9.2 mi [facility] [address] [place], [place] [address] [phone] Overall rating Much above average 18. 10.8 mi [place] at [facility] [address] [place], [place] [address] [phone] Overall rating Much above average 19. 12.6 mi [facility] [address] [place], [place] [address] [phone] Overall rating Much below average 20. 12.8 [facility] [place], [place] [address] [phone] Overall rating Much below average 21. 14.2 mi The [place] [address] [address] [place], [place] [address] [phone] Overall rating Below average 22. 14.4 mi [facility] at [place], [place] [address] [phone] Overall rating Above average 23. 14.8 mi [place] and [facility] [place], [place] [address] [phone] Overall rating Much above average 24. 14.9 [place] [address] [place], [place] [address] [phone] Overall rating Much below average 25. 16.5 mi [place] [address] [place], [place] [address] [phone] Overall rating Average 26. 16.7  mi [facility] [place], [place] [address] [phone] Overall rating Above average 27. 17.9 mi [place], [place] [address] [phone] Overall rating Average 28. 44.9 [facility] [address] [place], [place] [address] [phone] Overall rating Much below average 29. 19.7 mi [place] [facility] [facility] [address] [place], [place] [address] [phone] Overall rating Much below average 30. 20 mi [address] at [employer] at [facility], [place] [address] [phone] Overall rating Much above average 31. 21.1 mi [place], [place] [address] [phone] Overall rating Much below average 32. 21.6 [address] [place], [place] [address] [phone] Overall rating Below average 33. 16.3 [facility] [address] [place], [place] [address] [phone] Overall rating Below average 34. 21.8 [place], [place] [address] [phone] Overall rating Above average 35. [address] [place], [place] [address] [phone] Overall rating Much above average 36. 22.6 mi [facility] [address] [place], [place] [address] [phone] Overall rating Average 37. 22.7 mi [facility] [place], [place] [address] [phone] Overall rating Much below average 38. 23.3 mi [facility] [address] [place], [place] [address] [phone] Overall rating Above average 39. 23.5 [place], [place] [address] [phone] Overall rating Not available18 40. 24.1 mi [place] [address] [place], [place] [address] [phone] Overall rating Much below average 41. 24.2 mi [place] [address] [place], [place] [address] [phone] Overall rating Below average 42. 24.4 [facility] Care/[place] [address] [place], [place] [address] [phone] Overall rating Much below average 43. 24.5 mi [facility] [place], [place] [address] [phone] Overall rating Above average To explore and download nursing home Expected Discharge Plan: [place] Barriers to Discharge: Continued Medical Work up  Expected Discharge Plan and Services Expected Discharge Plan: [place] Determinants of Health (SDOH) Interventions    Readmission Risk Interventions No flowsheet data found.

## 2021-03-12 DIAGNOSIS — R7989 Other specified abnormal findings of blood chemistry: Secondary | ICD-10-CM | POA: Diagnosis not present

## 2021-03-12 DIAGNOSIS — L89302 Pressure ulcer of unspecified buttock, stage 2: Secondary | ICD-10-CM

## 2021-03-12 DIAGNOSIS — I4891 Unspecified atrial fibrillation: Secondary | ICD-10-CM | POA: Diagnosis not present

## 2021-03-12 DIAGNOSIS — J309 Allergic rhinitis, unspecified: Secondary | ICD-10-CM | POA: Diagnosis not present

## 2021-03-12 DIAGNOSIS — U071 COVID-19: Secondary | ICD-10-CM | POA: Diagnosis not present

## 2021-03-12 LAB — BASIC METABOLIC PANEL
Anion gap: 3 — ABNORMAL LOW (ref 5–15)
BUN: 12 mg/dL (ref 8–23)
CO2: 28 mmol/L (ref 22–32)
Calcium: 8.8 mg/dL — ABNORMAL LOW (ref 8.9–10.3)
Chloride: 105 mmol/L (ref 98–111)
Creatinine, Ser: 0.67 mg/dL (ref 0.61–1.24)
GFR, Estimated: >60 mL/min (ref >60)
Glucose, Bld: 89 mg/dL (ref 70–99)
Potassium: 3.7 mmol/L (ref 3.5–5.1)
Sodium: 136 mmol/L (ref 135–145)

## 2021-03-12 LAB — CBC WITH DIFFERENTIAL/PLATELET
Abs Immature Granulocytes: 0.04 10*3/uL (ref 0.00–0.07)
Basophils Absolute: 0 10*3/uL (ref 0.0–0.1)
Basophils Relative: 0 %
Eosinophils Absolute: 0.1 10*3/uL (ref 0.0–0.5)
Eosinophils Relative: 2 %
HCT: 34.7 % — ABNORMAL LOW (ref 39.0–52.0)
Hemoglobin: 11.8 g/dL — ABNORMAL LOW (ref 13.0–17.0)
Immature Granulocytes: 1 %
Lymphocytes Relative: 17 %
Lymphs Abs: 1.4 10*3/uL (ref 0.7–4.0)
MCH: 31.3 pg (ref 26.0–34.0)
MCHC: 34 g/dL (ref 30.0–36.0)
MCV: 92 fL (ref 80.0–100.0)
Monocytes Absolute: 0.7 10*3/uL (ref 0.1–1.0)
Monocytes Relative: 9 %
Neutro Abs: 5.6 10*3/uL (ref 1.7–7.7)
Neutrophils Relative %: 71 %
Platelets: 206 10*3/uL (ref 150–400)
RBC: 3.77 MIL/uL — ABNORMAL LOW (ref 4.22–5.81)
RDW: 13.2 % (ref 11.5–15.5)
WBC: 7.8 10*3/uL (ref 4.0–10.5)
nRBC: 0 % (ref 0.0–0.2)

## 2021-03-12 LAB — GLUCOSE, CAPILLARY
Glucose-Capillary: 86 mg/dL (ref 70–99)
Glucose-Capillary: 126 mg/dL — ABNORMAL HIGH (ref 70–99)
Glucose-Capillary: 122 mg/dL — ABNORMAL HIGH (ref 70–99)
Glucose-Capillary: 117 mg/dL — ABNORMAL HIGH (ref 70–99)

## 2021-03-12 LAB — MAGNESIUM: Magnesium: 2.1 mg/dL (ref 1.7–2.4)

## 2021-03-12 MED ORDER — MELATONIN 3 MG PO TABS: 3.0000 mg | ORAL_TABLET | Freq: Every day | ORAL | 0 refills | Status: AC

## 2021-03-12 MED ORDER — SENNOSIDES-DOCUSATE SODIUM 8.6-50 MG PO TABS: 1.0000 | ORAL_TABLET | Freq: Two times a day (BID) | ORAL | Status: AC

## 2021-03-12 MED ORDER — ADULT MULTIVITAMIN W/MINERALS CH: 1.0000 | ORAL_TABLET | Freq: Every day | ORAL | Status: AC

## 2021-03-12 MED ORDER — ACETAMINOPHEN 325 MG PO TABS: 650.0000 mg | ORAL_TABLET | Freq: Four times a day (QID) | ORAL | Status: AC | PRN

## 2021-03-12 MED ORDER — POLYETHYLENE GLYCOL 3350 17 G PO PACK: 17.0000 g | PACK | Freq: Two times a day (BID) | ORAL | 0 refills | Status: AC

## 2021-03-12 MED ORDER — ENSURE ENLIVE PO LIQD: 237.0000 mL | Freq: Three times a day (TID) | ORAL | 12 refills | Status: AC

## 2021-03-12 MED ORDER — VITAMIN D3 25 MCG PO TABS: 1000.0000 [IU] | ORAL_TABLET | Freq: Every day | ORAL | Status: AC

## 2021-03-12 MED ORDER — ASCORBIC ACID 500 MG PO TABS: 500.0000 mg | ORAL_TABLET | Freq: Every day | ORAL | Status: AC

## 2021-03-12 MED ORDER — JUVEN PO PACK: 1.0000 | PACK | Freq: Two times a day (BID) | ORAL | 0 refills | Status: AC

## 2021-03-12 MED ORDER — ZINC SULFATE 220 (50 ZN) MG PO CAPS: 220.0000 mg | ORAL_CAPSULE | Freq: Every day | ORAL | Status: AC

## 2021-03-12 MED ORDER — TRAZODONE HCL 50 MG PO TABS: 50.0000 mg | ORAL_TABLET | Freq: Every day | ORAL | 0 refills | Status: AC

## 2021-03-12 NOTE — Progress Notes (Signed)
Physical Therapy Treatment Patient Details Name: Erik Lara MRN: [id number] DOB: May 18, 1935 Today's Date: 03/12/2021   History of Present Illness Patient is a 85 year old male who presented to the hosptial after a fall at home with weakness and confusion. patient was found to have COVID 19, a fib with RVR, acute metabolic encephalopathy, and suspected aspiration pneumonia. PMH: lewy body dementia, parkinsons, hearing impairment.    PT Comments    General Comments: AxO x 1 requiring repeat simple commands and increased time to process.  Pt with delayed verbal responce.  Able to express needs with prompting.Co Tx with OT due to complexity and safety.  Assisted to EOB and OOB was very difficult.  General bed mobility comments: required + 2 Total Assist to transition from supine to EOB.  Severe posterior lean.  Rigid throughout.  Poor self ability to correct.  Frozen. General transfer comment: attempted sit to stand twice using STEDY however pt was unable to rise erect enough to clear hips off bed.  VERY rigid and grossly weak.  Therapist performed a "Bear Hug" to SPS 1/4 turn from a very elevated bed to recliner.  Rec nursing use Maxi Move to assist back to bed. Positioned in recliner to comfort and performed a gentle static stretch B LE knee extension.   Recommendations for follow up therapy are one component of a multi-disciplinary discharge planning process, led by the attending physician.  Recommendations may be updated based on patient status, additional functional criteria and insurance authorization.  Follow Up Recommendations  Skilled nursing-short term rehab (<3 hours/day)     Assistance Recommended at Discharge    Equipment Recommendations       Recommendations for Other Services       Precautions / Restrictions Precautions Precautions: Fall Precaution Comments: Dx Lewey Body Dementia + Parkinson's Restrictions Weight Bearing Restrictions: No     Mobility  Bed  Mobility Overal bed mobility: Needs Assistance Bed Mobility: Supine to Sit     Supine to sit: Max assist;Total assist;+2 for physical assistance;+2 for safety/equipment     General bed mobility comments: required + 2 Total Assist to transition from supine to EOB.  Severe posterior lean.  Rigid throughout.  Poor self ability to correct.  Frozen.    Transfers Overall transfer level: Needs assistance   Transfers: Sit to/from Stand             General transfer comment: attempted sit to stand twice using STEDY however pt was unable to rise erect enough to clear hips off bed.  VERY rigid and grossly weak.  Therapist performed a "Bear Hug" to SPS 1/4 turn from a very elevated bed to recliner.  Rec nursing use Maxi Move to assist back to bed.    Ambulation/Gait                   Stairs             Wheelchair Mobility    Modified Rankin (Stroke Patients Only)       Balance                                            Cognition Arousal/Alertness: Awake/alert Behavior During Therapy: Flat affect Overall Cognitive Status: Impaired/Different from baseline  General Comments: AxO x 1 requiring repeat simple commands and increased time to process.  Pt with delayed verbal responce.  Able to express needs with prompting.        Exercises      General Comments        Pertinent Vitals/Pain Pain Assessment: Faces Pain Location: B knees during positioning in extension    Home Living                          Prior Function            PT Goals (current goals can now be found in the care plan section) Progress towards PT goals: Progressing toward goals    Frequency    Min 2X/week      PT Plan Current plan remains appropriate    Co-evaluation PT/OT/SLP Co-Evaluation/Treatment: Yes Reason for Co-Treatment: Complexity of the patient's impairments (multi-system involvement);For  patient/therapist safety   OT goals addressed during session: ADL's and self-care      AM-PAC PT "6 Clicks" Mobility   Outcome Measure  Help needed turning from your back to your side while in a flat bed without using bedrails?: Total Help needed moving from lying on your back to sitting on the side of a flat bed without using bedrails?: Total Help needed moving to and from a bed to a chair (including a wheelchair)?: Total Help needed standing up from a chair using your arms (e.g., wheelchair or bedside chair)?: Total Help needed to walk in hospital room?: Total Help needed climbing 3-5 steps with a railing? : Total 6 Click Score: 6    End of Session Equipment Utilized During Treatment: Gait belt Activity Tolerance: Patient limited by fatigue Patient left: in chair;with call bell/phone within reach Nurse Communication: Mobility status;Need for lift equipment PT Visit Diagnosis: Other abnormalities of gait and mobility (R26.89);Muscle weakness (generalized) (M62.81);Difficulty in walking, not elsewhere classified (R26.2)     Time: 2122-4825 PT Time Calculation (min) (ACUTE ONLY): 17 min  Charges:  $Therapeutic Activity: 8-22 mins                     Rica Koyanagi  PTA Acute  Rehabilitation Services Pager      [phone] Office      [phone]

## 2021-03-12 NOTE — Progress Notes (Addendum)
Occupational Therapy Treatment Patient Details Name: Erik Lara MRN: [id number] DOB: June 24, 1935 Today's Date: 03/12/2021   History of present illness Patient is a 85 year old male who presented to the hosptial after a fall at home with weakness and confusion. patient was found to have COVID 19, a fib with RVR, acute metabolic encephalopathy, and suspected aspiration pneumonia. PMH: lewy body dementia, parkinsons, hearing impairment.   OT comments  Treatment focused on use of upper extremities for functional tasks while seated at edge of bed and in recliner. OT/PT cotreat to work on transfers and standing. Patient exhibited improved alertness and engagement today compared to previous visits. Cont POC.    Recommendations for follow up therapy are one component of a multi-disciplinary discharge planning process, led by the attending physician.  Recommendations may be updated based on patient status, additional functional criteria and insurance authorization.    Follow Up Recommendations  Skilled nursing-short term rehab (<3 hours/day)    Assistance Recommended at Discharge Frequent or constant Supervision/Assistance  Equipment Recommendations  None recommended by OT    Recommendations for Other Services      Precautions / Restrictions Precautions Precautions: Fall Precaution Comments: Dx Lewey Body Dementia Restrictions Weight Bearing Restrictions: No              ADL either performed or assessed with clinical judgement   ADL Overall ADL's : Needs assistance/impaired     Grooming: Set up;Wash/dry face;Brushing hair Grooming Details (indicate cue type and reason): set up to wash her face and encouraged to wipe his mouth throughout sitting on side of bed. Patient drooling at edge of bed with a flexed neck position. Patient able topartially comb his hari with his right hand.                             Functional mobility during ADLs: Maximal assistance;+2 for  physical assistance General ADL Comments: Patient transferred to side of bed and worked on leaning forward, lifting head and wiping mouth.    Extremity/Trunk Assessment              Vision   Vision Assessment?: No apparent visual deficits          Cognition Arousal/Alertness: Awake/alert Behavior During Therapy: Flat affect Overall Cognitive Status: Impaired/Different from baseline                                 General Comments: able to follow most commands, answering questions                     Pertinent Vitals/ Pain       Pain Assessment: No/denies pain  Home Living                   Prior Functioning/Environment              Frequency  Min 2X/week        Progress Toward Goals  OT Goals(current goals can now be found in the care plan section)  Progress towards OT goals: Progressing toward goals  Acute Rehab OT Goals OT Goal Formulation: Patient unable to participate in goal setting Time For Goal Achievement: 03/15/21 Potential to Achieve Goals: [place] Discharge plan remains appropriate    Co-evaluation    PT/OT/SLP Co-Evaluation/Treatment: Yes Reason for Co-Treatment: For patient/therapist safety;To address functional/ADL transfers   OT  goals addressed during session: ADL's and self-care      AM-PAC OT "6 Clicks" Daily Activity     Outcome Measure   Help from another person eating meals?: A Lot Help from another person taking care of personal grooming?: A Lot Help from another person toileting, which includes using toliet, bedpan, or urinal?: Total Help from another person bathing (including washing, rinsing, drying)?: A Lot Help from another person to put on and taking off regular upper body clothing?: A Lot Help from another person to put on and taking off regular lower body clothing?: Total 6 Click Score: 10    End of Session Equipment Utilized During Treatment:  (stedy)  OT Visit Diagnosis:  Unsteadiness on feet (R26.81);Muscle weakness (generalized) (M62.81);Other symptoms and signs involving cognitive function   Activity Tolerance Patient tolerated treatment well   Patient Left in chair;with call bell/phone within reach;with chair alarm set   Nurse Communication Mobility status (hoyer back to bed)        Time: 5183-4373 OT Time Calculation (min): 27 min  Charges: OT General Charges $OT Visit: 1 Visit OT Treatments $Self Care/Home Management : 8-22 mins  Derl Barrow, OTR/L [place]  Office [phone] Pager: [place] 03/12/2021, 12:17 PM

## 2021-03-12 NOTE — TOC Progression Note (Addendum)
Transition of Care [facility]) - Progression Note    Patient Details  Name: Erik Lara MRN: [id number] Date of Birth: Feb 21, 1936  Transition of Care [facility]) CM/SW Contact  Leeroy Cha, RN Phone Number: 03/12/2021, 1:34 PM  Clinical Narrative:    Discussed placement options with the faily.  [facility] has a male bed will sent the fl2 . Tct-[facility] left message on voice mail that patient does want to come to this facility Tct-MD. D. Thompson-alerted that patient wants to go to [facility] will do dc summary. Tct-Gr[facility]. Will start auth through hta this afternoon hopefully can get it by tomorrow, md notified. Expected Discharge Plan: Skilled Nursing Facility Barriers to Discharge: Continued Medical Work up  Expected Discharge Plan and Services Expected Discharge Plan: [place]                                               Social Determinants of Health (SDOH) Interventions    Readmission Risk Interventions No flowsheet data found.

## 2021-03-12 NOTE — TOC Progression Note (Signed)
Transition of Care [facility]) - Progression Note    Patient Details  Name: Erik Lara MRN: [id number] Date of Birth: [birth year]  Transition of Care [facility]) CM/SW Contact  Natasha Paulson, Juliann Pulse, RN Phone Number: 03/12/2021, 8:15 AM  Clinical Narrative: Spoke to Ellen(spouse) informed of bed offers-list in rm-await choice prior auth for SNF, & PTAR.    1. 1.3 mi [facility] and [place], [place] [address] [phone] Overall rating Above average 2. 1.6 mi [place] at [place], [place] [address] [phone] Overall rating Much below average 3. 2.1 mi [place], [place] [address] [phone] Overall rating Much below average 4. 2.5 mi [facility] [place], [place] [address] [phone] Overall rating Below average 5. 2.8 mi [facility] at the [place], [place] [address] [phone] Overall rating Average 6. 2.8 mi [place] [address] [place], [place] [address] [phone] Overall rating Much below average 7. 3 mi [facility] [place], [place] [address] [phone] Overall rating Above average 8. 3.6 [place] [address] [place], [place] [address] [phone] Overall rating Average 9. 3.6 mi [facility] [address] [place], [place] [address] [phone] Overall rating Much below average 10. 3.9 mi [facility] [place], [place] [address] [phone] Overall rating Much below average 11. 4.4 mi [facility] at [place], [place] [address] [phone] Overall rating Much above average 12. 4.6  mi [facility] [address] [place], [place] [address] [phone] Overall rating Much above average 13. 5.5 mi [facility] [address] [place], [place] [address] [phone] Overall rating Above average 14. 8.2 [facility] [place], [place] [address] [phone] Overall rating Much above average 15. 9 mi [facility] [address] [place], [place] [address] [phone] Overall rating Below average 16. 9.1 [place] and [address] [facility] [place], [place] [address] [phone] Overall rating Much below average 17. 9.2 mi [facility] [address] [place], [place] [address] [phone] Overall rating Much above average 18. 10.8 mi [facility] [address] [place], [place] [address] [phone] Overall rating Much above average 19. 12.6 mi [facility] [address] [place], [place] [address] [phone] Overall rating Much below average 20. 12.8 [facility] [place], [place] [address] [phone] Overall rating Much below average 21. 14.2 mi The [address] [place], [place] [address] [phone] Overall rating Below average 22. 14.4 mi [facility] [place], [place] [address] [phone] Overall rating Above average 23. 14.8 mi [place] and [facility] [place], [place] [address] [phone] Overall rating Much above average 24. 14.9 [place] [address] [place], [place] [address] [phone] Overall rating Much below average 25. 16.5 mi [place] [address] [place],  [address] [phone] Overall rating Average 26. 16.7  mi [facility] [place],  [phone]  [phone] Overall rating Above average 27. 17.9 mi [place], [place] [address] [phone] Overall rating Average 28. 51.8 [facility] [address] [place], [place] [address] [phone] Overall rating Much below average 29. 19.7 mi [facility] [address] [place], [place] [address] [phone] Overall rating Much below average 30. [address] at [employer] at [facility], [place] [address] [phone] Overall rating Much above average 31. 21.1 mi [facility] [place], [address] [address] [phone] Overall rating Much below average 32. 21.6 [address] [place], [place] [address] [phone] Overall rating Below average 33. 93.7 [facility] [address] [place], [place] [address] [phone] Overall rating Below average 34. 21.8 [place], [place] [address] [phone] Overall rating Above average 35. [address] [place], [place] [address] [phone] Overall rating Much above average 36. 22.6 mi [facility] [address] [place], [place] [address] [phone] Overall rating Average 37. 22.7 mi [facility] [place], [place] [address] [phone] Overall rating Much below average 38. 23.3 mi [facility] [address] [place], [place] [address] [phone] Overall rating Above average 39. 23.5 [place], [place] [address] [phone] Overall rating Not available18 40. 24.1 mi [place] [address] [place], [place] [address] [phone] Overall rating Much below average 41. 24.2 mi [place] [address] [place], [place] [address] [phone] Overall rating Below average 42. 24.4 [facility] Care/[place] [address] [place],  [address] [phone] Overall rating Much below average 43. 24.5 mi [facility] [place],  [address] [phone] Overall rating Above average To explore and download nursing home  Expected Discharge Plan: [place] Barriers to Discharge: Continued Medical Work up  Expected Discharge Plan and Services Expected Discharge Plan: [place] Determinants of Health (SDOH) Interventions    Readmission Risk Interventions No flowsheet data found.

## 2021-03-12 NOTE — Discharge Summary (Signed)
Physician Discharge Summary  Erik Lara SEG:[id number] DOB: [birth year] DOA: 02/26/2021  PCP: Dorothyann Peng, NP  Admit date: 02/26/2021 Discharge date: 03/12/2021  Time spent: 60 minutes  Recommendations for Outpatient Follow-up:  Follow-up with MD at skilled nursing facility.  Patient will need a voiding trial done in 2 to 3 days and if fails voiding trial Foley catheter will need to be placed and patient will need to be set up in the outpatient setting to follow-up with urology. Follow-up with neurology, Dr. Delice Lesch as previously scheduled.   Discharge Diagnoses:  Principal Problem:   COVID-19 virus infection Active Problems:   Hyperlipidemia   Allergic rhinitis   Hyperthyroidism   Type 2 diabetes mellitus without complication, without long-term current use of insulin (HCC)   Mild neurocognitive disorder   Hypocalcemia   Mild protein malnutrition (HCC)   Normocytic anemia   Atrial fibrillation with RVR (HCC)   Physical deconditioning   Superficial venous thrombosis of arm, left   Pressure injury of skin   Abnormal TSH   Generalized weakness   Discharge Condition: Stable and improved  Diet recommendation: Regular diet  Filed Weights   03/09/21 0500 03/10/21 0500 03/12/21 0347  Weight: 79.4 kg 76.9 kg 81.4 kg    History of present illness:  HPI per Dr. Rogelio Seen Erik Lara is a 85 y.o. male with medical history significant of symptomatic gallstones, right ear hearing loss, hepatic cyst, hyperlipidemia, hypothyroidism, mild neurocognitive disorder, history of prostatitis, type II DM who is brought to the emergency department due to worsening generalized weakness, worsening dementia symptoms and having a fall last night.  EMS was called and help him get back in a recliner chair.  The patient stay there all night.  Family member stated earlier that he has been having urinary and fecal incontinence.  His appetite is decreased.  The patient is unable to provide further  history due to his dementia symptoms.   ED Course: Initial vital signs were temperature 98.5 F, pulse 74, respiration 18, BP 142/66 mmHg and O2 sat 99% on room air.  The patient received a 500 mL NS bolus in the emergency department.   Lab work: His CBC showed a white count of 5.7, hemoglobin 12.9 g/dL platelets 138.  CMP showed a CO2 of 20 mmol/L with a normal anion gap.  Corrected calcium is 8.9 and bilirubin 2.1 mg/dL.  Renal function, the rest of the electrolytes, glucose and the rest of the LFTs were within normal limits.  Coronavirus PCR was positive.   Imaging: A 1 view chest radiograph did not show any acute cardiopulmonary pathology.  CT head without acute intracranial pathology.  CT cervical spine with no acute findings.  Please see images and full radiology report for further details.  Hospital Course:  #1 COVID-19 virus infection, POA -Patient with no noted hypoxia with sats of 99-100% on room air. -Initially started on Paxlovid but due to poor oral intake missed several doses and subsequently transition to IV remdesivir on 03/03/2021 and completed 3-day course of treatment on 03/05/2021. -Status post 7 days of Unasyn followed by Augmentin. -Antibiotic course completed. -Patient completed quarantine and airborne precautions were subsequently discontinued.  2.  New onset A. fib with RVR -No prior history noted. -Likely precipitated by underlying COVID-19 infection. -Patient back in normal sinus rhythm and noted to have significant hematuria while on heparin drip which was subsequently discontinued on 03/03/2021. -Amiodarone discontinued 03/03/2021 patient remained in normal sinus rhythm.   -Outpatient follow-up with PCP.  3.  Superficial vein thrombosis left upper extremity -SVT noted in left basilic and left cephalic vein per Doppler ultrasound. -Supportive care with ice/heat as needed, elevation.  4.  Hematuria- -Patient noted to have hematuria developed after initiation of  heparin drip which was subsequently discontinued. -Foley catheter placed 03/06/2021, hematuria following subsequently resolved. -Patient noted to have some hematuria on 03/11/2021 that subsequently resolved with irrigation of Foley catheter.  -Patient will be discharged with Foley catheter in place and will need a voiding trial in 2 to 3 days post discharge.   -If patient fails voiding trial Foley cath will need to be placed back in with outpatient follow-up with urology.    5.  Physical deconditioning -Per Dr.Girguis, patient son noted patient typically ambulatory at home with some assistance from cane occasionally. -Patient deconditioning in the setting of prolonged hospitalization COVID-19 infection. -Seen by PT/OT who recommended SNF placement. -TOC following.  6.  Hypocalcemia -Repleted.  7.  Abnormal TSH versus hyperthyroidism, POA -TSH suppressed on admission at 0.143. -Free T4 noted elevated at 1.39. -Thyroid ultrasound with 4 nodules in 2 complexes noted none recommended for biopsy on follow-up per TI-RADS criteria. -We will need repeat thyroid function studies in about 4 to 6 weeks after acute illness has resolved in the outpatient setting.  8.  Mild neurocognitive disorder -Likely secondary to recent diagnosis of Lewy body dementia. -Delirium precautions. -Supportive care. -Outpatient follow-up with neurology as previously scheduled..  9.  Normocytic anemia -H&H remained stable.  10.  Well-controlled type 2 diabetes mellitus -Hemoglobin A1c 5.7% on 02/27/2021 -Patient noted to be on diet control.    11.  Allergic rhinitis -Stable.  12.  Hyperlipidemia -Statin held during the hospitalization will be resumed on discharge.  13.  Pressure injury right anus stage II, POA Pressure Injury 03/04/21 Anus Right;Left Stage 2 -  Partial thickness loss of dermis presenting as a shallow open injury with a red, pink wound bed without slough. non-blanchable redness with small skin  tears (Active)  03/04/21 2000  Location: Anus  Location Orientation: Right;Left  Staging: Stage 2 -  Partial thickness loss of dermis presenting as a shallow open injury with a red, pink wound bed without slough.  Wound Description (Comments): non-blanchable redness with small skin tears  Present on Admission: No          Procedures: CT head CT C-spine 02/26/2021 Chest x-ray 02/26/2021, 02/28/2021, 03/05/2021 2D echo 02/28/2021 Upper extremity ultrasound 03/04/2021  Consultations: Palliative care: Dr. Rowe Pavy 03/03/2021 Cardiology: Dr. Sallyanne Kuster 02/28/2021  Discharge Exam: Vitals:   03/12/21 0602 03/12/21 1250  BP: (!) 133/56 (!) 97/51  Pulse: (!) 55 65  Resp: 12   Temp: (!) 97.5 F (36.4 C) (!) 97.4 F (36.3 C)  SpO2: 99% 100%    General: NAD. Cardiovascular: Regular rate and rhythm no murmurs rubs or gallops.  No JVD.  No lower extremity edema. Respiratory: Clear to auscultation bilaterally.  No wheezes, no crackles, no rhonchi.  Fair air movement.  Discharge Instructions   Discharge Instructions     Diet general   Complete by: As directed    Discharge wound care:   Complete by: As directed    As above   Increase activity slowly   Complete by: As directed       Allergies as of 03/12/2021   No Known Allergies      Medication List     TAKE these medications    acetaminophen 325 MG tablet Commonly known as: TYLENOL Take  2 tablets (650 mg total) by mouth every 6 (six) hours as needed for mild pain (or Fever >/= 101).   ascorbic acid 500 MG tablet Commonly known as: VITAMIN C Take 1 tablet (500 mg total) by mouth daily. Start taking on: March 13, 2021   cetirizine 10 MG tablet Commonly known as: ZYRTEC Take 10 mg by mouth daily.   donepezil 10 MG tablet Commonly known as: ARICEPT Take 1 tablet (10 mg total) by mouth at bedtime.   feeding supplement Liqd Take 237 mLs by mouth 3 (three) times daily between meals.   nutrition supplement  (JUVEN) Pack Take 1 packet by mouth 2 (two) times daily between meals. Start taking on: March 13, 2021   melatonin 3 MG Tabs tablet Take 1 tablet (3 mg total) by mouth at bedtime.   memantine 10 MG tablet Commonly known as: NAMENDA Take 1 tablet twice a day What changed:  how much to take how to take this when to take this additional instructions   multivitamin with minerals Tabs tablet Take 1 tablet by mouth daily. Start taking on: March 13, 2021   polyethylene glycol 17 g packet Commonly known as: MIRALAX / GLYCOLAX Take 17 g by mouth 2 (two) times daily.   senna-docusate 8.6-50 MG tablet Commonly known as: Senokot-S Take 1 tablet by mouth 2 (two) times daily.   simvastatin 20 MG tablet Commonly known as: ZOCOR TAKE ONE TABLET BY MOUTH EVERY NIGHT AT BEDTIME   traZODone 50 MG tablet Commonly known as: DESYREL Take 1 tablet (50 mg total) by mouth at bedtime.   Vitamin D3 25 MCG tablet Commonly known as: Vitamin D Take 1 tablet (1,000 Units total) by mouth daily. Start taking on: March 13, 2021   zinc sulfate 220 (50 Zn) MG capsule Take 1 capsule (220 mg total) by mouth daily. Start taking on: March 13, 2021               Discharge Care Instructions  (From admission, onward)           Start     Ordered   03/12/21 0000  Discharge wound care:       Comments: As above   03/12/21 1433           No Known Allergies  Follow-up Information     MD AT SNF Follow up.          Cameron Sprang, MD Follow up.   Specialty: Neurology Why: Follow-up as previously scheduled Contact information: [address] [place] [address] [phone]                  The results of significant diagnostics from this hospitalization (including imaging, microbiology, ancillary and laboratory) are listed below for reference.    Significant Diagnostic Studies: DG Chest 1 View  Result Date: 02/26/2021 CLINICAL DATA:   85 year old male with history of fall and weakness. EXAM: CHEST  1 VIEW COMPARISON:  No priors. FINDINGS: Skin fold artifact projecting over the lateral left hemithorax. Eventration of the right hemidiaphragm. Lung volumes are normal. No consolidative airspace disease. No pleural effusions. No pneumothorax. No pulmonary nodule or mass noted. Pulmonary vasculature and the cardiomediastinal silhouette are within normal limits. Atherosclerosis in the thoracic aorta. IMPRESSION: 1.  No radiographic evidence of acute cardiopulmonary disease. 2. Aortic atherosclerosis. Electronically Signed   By: Vinnie Langton M.D.   On: 02/26/2021 11:26   CT Head Wo Contrast  Result Date: 02/26/2021 CLINICAL  DATA:  Mental status change. Recent falls. New incontinence. EXAM: CT HEAD WITHOUT CONTRAST CT CERVICAL SPINE WITHOUT CONTRAST TECHNIQUE: Multidetector CT imaging of the head and cervical spine was performed following the standard protocol without intravenous contrast. Multiplanar CT image reconstructions of the cervical spine were also generated. COMPARISON:  Head MRI 09/13/2019 and. CT head and cervical spine 09/18/2017. FINDINGS: CT HEAD FINDINGS Brain: There is no evidence of an acute infarct, intracranial hemorrhage, mass, midline shift, or extra-axial fluid collection. Generalized cerebral atrophy is mild for age. Vascular: Calcified atherosclerosis at the skull base. No hyperdense vessel. Skull: No fracture or suspicious osseous lesion. Sinuses/Orbits: Paranasal sinuses and mastoid air cells are clear. Unremarkable orbits. Other: None. CT CERVICAL SPINE FINDINGS Alignment: Straightening of the normal cervical lordosis. No listhesis. Skull base and vertebrae: No acute fracture. Scattered small sclerotic foci in the cervical and included upper thoracic spine, chronic and largely unchanged although a focus in the left C7 transverse process has mildly enlarged. Given the stability of most of these foci for 2+ years, a  benign etiology such as multiple bone islands is strongly favored over metastatic disease. Soft tissues and spinal canal: No prevertebral fluid or swelling. No visible canal hematoma. Disc levels: Moderate cervical disc and facet degeneration without evidence of high-grade spinal canal stenosis. Uncovertebral and facet spurring results in multilevel neural foraminal stenosis, severe on the right at C3-4. Upper chest: Clear lung apices. Other: None. IMPRESSION: 1. No evidence of acute intracranial abnormality. 2. No evidence of acute fracture or subluxation in the cervical spine. Electronically Signed   By: Logan Bores M.D.   On: 02/26/2021 11:37   CT Cervical Spine Wo Contrast  Result Date: 02/26/2021 CLINICAL DATA:  Mental status change. Recent falls. New incontinence. EXAM: CT HEAD WITHOUT CONTRAST CT CERVICAL SPINE WITHOUT CONTRAST TECHNIQUE: Multidetector CT imaging of the head and cervical spine was performed following the standard protocol without intravenous contrast. Multiplanar CT image reconstructions of the cervical spine were also generated. COMPARISON:  Head MRI 09/13/2019 and. CT head and cervical spine 09/18/2017. FINDINGS: CT HEAD FINDINGS Brain: There is no evidence of an acute infarct, intracranial hemorrhage, mass, midline shift, or extra-axial fluid collection. Generalized cerebral atrophy is mild for age. Vascular: Calcified atherosclerosis at the skull base. No hyperdense vessel. Skull: No fracture or suspicious osseous lesion. Sinuses/Orbits: Paranasal sinuses and mastoid air cells are clear. Unremarkable orbits. Other: None. CT CERVICAL SPINE FINDINGS Alignment: Straightening of the normal cervical lordosis. No listhesis. Skull base and vertebrae: No acute fracture. Scattered small sclerotic foci in the cervical and included upper thoracic spine, chronic and largely unchanged although a focus in the left C7 transverse process has mildly enlarged. Given the stability of most of these  foci for 2+ years, a benign etiology such as multiple bone islands is strongly favored over metastatic disease. Soft tissues and spinal canal: No prevertebral fluid or swelling. No visible canal hematoma. Disc levels: Moderate cervical disc and facet degeneration without evidence of high-grade spinal canal stenosis. Uncovertebral and facet spurring results in multilevel neural foraminal stenosis, severe on the right at C3-4. Upper chest: Clear lung apices. Other: None. IMPRESSION: 1. No evidence of acute intracranial abnormality. 2. No evidence of acute fracture or subluxation in the cervical spine. Electronically Signed   By: Logan Bores M.D.   On: 02/26/2021 11:37   DG CHEST PORT 1 VIEW  Result Date: 02/28/2021 CLINICAL DATA:  85 year old male with history of altered mental status. EXAM: PORTABLE CHEST 1 VIEW COMPARISON:  Chest x-ray 02/26/2021. FINDINGS: Skin fold artifact projecting over the thorax bilaterally. Lung volumes are low. No consolidative airspace disease. No pleural effusions. No pneumothorax. No pulmonary nodule or mass noted. Pulmonary vasculature and the cardiomediastinal silhouette are within normal limits. Atherosclerotic calcifications are noted in the thoracic aorta. IMPRESSION: 1. Low lung volumes without radiographic evidence of acute cardiopulmonary disease. 2. Aortic atherosclerosis. Electronically Signed   By: Vinnie Langton M.D.   On: 02/28/2021 11:53   DG Abd Portable 1V  Result Date: 03/05/2021 CLINICAL DATA:  Abdominal distention EXAM: PORTABLE ABDOMEN - 1 VIEW COMPARISON:  None. FINDINGS: There is gaseous distention of the stomach and multiple loops of small and large bowel without evidence of mechanical obstruction. There is no definite free intraperitoneal air, within the confines of supine technique. Irregular curvilinear calcifications projecting over the right upper quadrant may reflect calcified cyst walls and/or gallstones, both seen on the remote CT  abdomen/pelvis from 02/22/2007. There is multilevel degenerative change of the spine. IMPRESSION: 1. Gaseous distention of the stomach and multiple loops of small and large bowel without evidence of mechanical obstruction. 2. Irregular curvilinear calcifications projecting over the right upper quadrant may reflect calcified cyst walls and/or gallstones, both seen on the remote CT abdomen/pelvis from 02/22/2007. Electronically Signed   By: Valetta Mole M.D.   On: 03/05/2021 10:37   US THYROID  Result Date: 03/05/2021 CLINICAL DATA:  Abnormal TSH, COVID EXAM: THYROID ULTRASOUND TECHNIQUE: Ultrasound examination of the thyroid gland and adjacent soft tissues was performed. Technologist describes technically difficult study secondary to unresponsive patient unable to fully extend neck. COMPARISON:  01/31/2005 FINDINGS: Parenchymal Echotexture: Markedly heterogenous Isthmus: 0.1 cm thickness, previously 0.2 Right lobe: 4.1 x 2.1 x 2 cm, previously 5.3 x 1.6 x 2.3 Left lobe: 6.6 x 2.8 x 2.5 cm, previously 6.5 x 1.8 x 2.5 _________________________________________________________ Estimated total number of nodules >/= 1 cm: 2 Number of spongiform nodules >/=  2 cm not described below (TR1): 0 Number of mixed cystic and solid nodules >/= 1.5 cm not described below ([place]): 0 _________________________________________________________ Nodule 1: 0.7 cm complex cyst, inferior right, previously 0.8; This nodule does NOT meet TI-RADS criteria for biopsy or dedicated follow-up. Nodule # 2: Location: Right; mid Maximum size: 0.8 cm; Other 2 dimensions: 0.5 x 0.7 cm Composition: solid/almost completely solid (2) Echogenicity: hypoechoic (2) Shape: not taller-than-wide (0) Margins: ill-defined (0) Echogenic foci: macrocalcifications (1) ACR TI-RADS total points: 5. ACR TI-RADS risk category: TR4 (4-6 points). ACR TI-RADS recommendations: Given size (<0.9 cm) and appearance, this nodule does NOT meet TI-RADS criteria for biopsy or  dedicated follow-up. _________________________________________________________ Nodule 3: 2.1 x 1 x 1.3 cm isoechoic superior left nodule, previously 2.4 x 1.3 x 1.7; stability for greater than 5 years implies benignity. This nodule does NOT meet TI-RADS criteria for biopsy or dedicated follow-up. Nodule 4: 0.7 cm hypoechoic nodule without calcifications, superior left; This nodule does NOT meet TI-RADS criteria for biopsy or dedicated follow-up. Nodule 5: 1 cm complex cyst, superior left; This nodule does NOT meet TI-RADS criteria for biopsy or dedicated follow-up. Nodule 6: 0.7 cm complex cyst, inferior left; This nodule does NOT meet TI-RADS criteria for biopsy or dedicated follow-up. IMPRESSION: 1. Stable asymmetric thyromegaly with bilateral nodules. None meets criteria for biopsy or follow-up. The above is in keeping with the ACR TI-RADS recommendations - J Am Coll Radiol 2017;14:587-595. Electronically Signed   By: Lucrezia Europe M.D.   On: 03/05/2021 09:09   VAS [place] UPPER EXTREMITY VENOUS  DUPLEX  Result Date: 03/05/2021 UPPER VENOUS STUDY  Patient Name:  DECKLYN HYDER  Date of Exam:   03/04/2021 Medical Rec #: [id number]         Accession #:    [id number] Date of Birth: 06-May-1935         Patient Gender: M Patient Age:   59 years Exam Location:  [facility] Procedure:      VAS [place] UPPER EXTREMITY VENOUS DUPLEX Referring Phys: Dwyane Dee --------------------------------------------------------------------------------  Indications: Edema, and Erythema Comparison Study: No prior study Performing Technologist: Maudry Mayhew MHA, RDMS, RVT, RDCS  Examination Guidelines: A complete evaluation includes B-mode imaging, spectral Doppler, color Doppler, and power Doppler as needed of all accessible portions of each vessel. Bilateral testing is considered an integral part of a complete examination. Limited examinations for reoccurring indications may be performed as noted.  Right Findings:  +----------+------------+---------+-----------+----------+-------+  RIGHT      Compressible Phasicity Spontaneous Properties Summary  +----------+------------+---------+-----------+----------+-------+  Subclavian                 Yes        Yes                         +----------+------------+---------+-----------+----------+-------+  Left Findings: +----------+------------+---------+-----------+----------+-------+  LEFT       Compressible Phasicity Spontaneous Properties Summary  +----------+------------+---------+-----------+----------+-------+  IJV            Full        Yes        Yes                         +----------+------------+---------+-----------+----------+-------+  Subclavian     Full        Yes        Yes                         +----------+------------+---------+-----------+----------+-------+  Axillary       Full        Yes        Yes                         +----------+------------+---------+-----------+----------+-------+  Brachial       Full        Yes        Yes                         +----------+------------+---------+-----------+----------+-------+  Radial         Full                                               +----------+------------+---------+-----------+----------+-------+  Ulnar          Full                                               +----------+------------+---------+-----------+----------+-------+  Cephalic       None  Acute   +----------+------------+---------+-----------+----------+-------+  Basilic        None                                       Acute   +----------+------------+---------+-----------+----------+-------+  Summary:  Right: No evidence of thrombosis in the subclavian.  Left: No evidence of deep vein thrombosis in the upper extremity. Findings consistent with acute superficial vein thrombosis involving the left basilic vein surrounding the IV in the upper arm, and left cephalic vein in the forearm.  *See table(s) above for  measurements and observations.  Diagnosing physician: Jamelle Haring Electronically signed by Jamelle Haring on 03/05/2021 at 2:36:55 PM.    Final    ECHOCARDIOGRAM LIMITED  Result Date: 02/28/2021    ECHOCARDIOGRAM LIMITED REPORT   Patient Name:   ALEXEY RHOADS Date of Exam: 02/28/2021 Medical Rec #:  [id number]        Height:       73.0 in Accession #:    [id number]       Weight:       180.0 lb Date of Birth:  07-09-1935        BSA:          2.057 m Patient Age:    52 years         BP:           119/73 mmHg Patient Gender: M                HR:           139 bpm. Exam Location:  Inpatient Procedure: Limited Echo, Color Doppler and Cardiac Doppler Indications:    I48.91* Unspecified atrial fibrillation  History:        Patient has no prior history of Echocardiogram examinations.                 Risk Factors:Diabetes and Dyslipidemia.  Sonographer:    Raquel Sarna Senior RDCS Referring Phys: [id number] [place]  Sonographer Comments: COVID+ Technically difficult study, patient confused and pushing away probe. IMPRESSIONS  1. Left ventricular ejection fraction, by estimation, is 60 to 65%. The left ventricle has normal function. Left ventricular endocardial border not optimally defined to evaluate regional wall motion. Left ventricular diastolic function could not be evaluated.  2. Right ventricular systolic function is normal. The right ventricular size is normal.  3. Multiple hypoechoic lesions are seen in the left lobe of the liver (probably cysts).  4. The mitral valve is normal in structure. No evidence of mitral valve regurgitation. No evidence of mitral stenosis.  5. The aortic valve is normal in structure. Aortic valve regurgitation is not visualized. Aortic valve sclerosis is present, with no evidence of aortic valve stenosis.  6. The inferior vena cava is normal in size with greater than 50% respiratory variability, suggesting right atrial pressure of 3 mmHg. Conclusion(s)/Recommendation(s): Limited study  due to lack of patient cooperation, but no significant cardiac abnormalities are identified. FINDINGS  Left Ventricle: Left ventricular ejection fraction, by estimation, is 60 to 65%. The left ventricle has normal function. Left ventricular endocardial border not optimally defined to evaluate regional wall motion. The left ventricular internal cavity size was normal in size. There is no left ventricular hypertrophy. Left ventricular diastolic function could not be evaluated. Right Ventricle: The right ventricular size is normal. No increase in right ventricular wall thickness. Right ventricular systolic  function is normal. Left Atrium: Left atrial size was normal in size. Right Atrium: Right atrial size was normal in size. Pericardium: Multiple hypoechoic lesions are seen in the left lobe of the liver (probably cysts). There is no evidence of pericardial effusion. Mitral Valve: The mitral valve is normal in structure. No evidence of mitral valve stenosis. Tricuspid Valve: The tricuspid valve is normal in structure. Tricuspid valve regurgitation is not demonstrated. No evidence of tricuspid stenosis. Aortic Valve: The aortic valve is normal in structure. Aortic valve regurgitation is not visualized. Aortic valve sclerosis is present, with no evidence of aortic valve stenosis. Pulmonic Valve: The pulmonic valve was normal in structure. Pulmonic valve regurgitation is not visualized. No evidence of pulmonic stenosis. Aorta: The aortic root is normal in size and structure. Venous: The inferior vena cava is normal in size with greater than 50% respiratory variability, suggesting right atrial pressure of 3 mmHg. IAS/Shunts: No atrial level shunt detected by color flow Doppler. AORTIC VALVE LVOT Vmax:   51.60 cm/s LVOT Vmean:  38.200 cm/s LVOT VTI:    0.098 m  SHUNTS Systemic VTI: 0.10 m Dani Gobble Croitoru MD Electronically signed by Sanda Klein MD Signature Date/Time: 02/28/2021/2:36:22 PM    Final     Microbiology: No  results found for this or any previous visit (from the past 240 hour(s)).   Labs: Basic Metabolic Panel: Recent Labs  Lab 03/08/21 0453 03/09/21 0901 03/10/21 0453 03/11/21 0441 03/12/21 0508  NA 144 142 139 137 136  K 3.7 3.5 3.9 3.5 3.7  CL 112* 108 108 105 105  CO2 29 30 27 26 28   GLUCOSE 103* 120* 107* 85 89  BUN 19 17 16 12 12   CREATININE 0.71 0.83 0.64 0.58* 0.67  CALCIUM 8.7* 8.7* 8.7* 8.7* 8.8*  MG 2.0 1.9 2.0 1.9 2.1   Liver Function Tests: Recent Labs  Lab 03/06/21 0451 03/07/21 0519 03/08/21 0453  AST 28 22 19   ALT 36 31 27  ALKPHOS 50 47 45  BILITOT 1.2 1.1 1.0  PROT 5.3* 5.1* 4.9*  ALBUMIN 2.4* 2.4* 2.2*   No results for input(s): LIPASE, AMYLASE in the last 168 hours. No results for input(s): AMMONIA in the last 168 hours. CBC: Recent Labs  Lab 03/08/21 0453 03/09/21 0901 03/10/21 0453 03/11/21 0441 03/12/21 0508  WBC 5.8 7.0 6.8 7.3 7.8  NEUTROABS 3.9 5.5 4.8 5.3 5.6  HGB 10.6* 11.7* 12.2* 12.3* 11.8*  HCT 33.2* 36.3* 37.6* 38.1* 34.7*  MCV 94.6 95.3 93.8 93.6 92.0  PLT 205 201 210 187 206   Cardiac Enzymes: No results for input(s): CKTOTAL, CKMB, CKMBINDEX, TROPONINI in the last 168 hours. BNP: BNP (last 3 results) No results for input(s): BNP in the last 8760 hours.  ProBNP (last 3 results) No results for input(s): PROBNP in the last 8760 hours.  CBG: Recent Labs  Lab 03/11/21 1133 03/11/21 1640 03/11/21 2036 03/12/21 0802 03/12/21 1139  GLUCAP 106* 108* 110* 86 126*       Signed:  Irine Seal MD.  [facility] 03/12/2021, 2:35 PM

## 2021-03-13 LAB — BASIC METABOLIC PANEL
Anion gap: 6 (ref 5–15)
BUN: 12 mg/dL (ref 8–23)
CO2: 30 mmol/L (ref 22–32)
Calcium: 9.1 mg/dL (ref 8.9–10.3)
Chloride: 104 mmol/L (ref 98–111)
Creatinine, Ser: 0.59 mg/dL — ABNORMAL LOW (ref 0.61–1.24)
GFR, Estimated: >60 mL/min (ref >60)
Glucose, Bld: 105 mg/dL — ABNORMAL HIGH (ref 70–99)
Potassium: 4 mmol/L (ref 3.5–5.1)
Sodium: 140 mmol/L (ref 135–145)

## 2021-03-13 LAB — CBC WITH DIFFERENTIAL/PLATELET
Abs Immature Granulocytes: 0.03 10*3/uL (ref 0.00–0.07)
Basophils Absolute: 0 10*3/uL (ref 0.0–0.1)
Basophils Relative: 1 %
Eosinophils Absolute: 0.1 10*3/uL (ref 0.0–0.5)
Eosinophils Relative: 2 %
HCT: 37.7 % — ABNORMAL LOW (ref 39.0–52.0)
Hemoglobin: 12 g/dL — ABNORMAL LOW (ref 13.0–17.0)
Immature Granulocytes: 0 %
Lymphocytes Relative: 14 %
Lymphs Abs: 1.1 10*3/uL (ref 0.7–4.0)
MCH: 29.9 pg (ref 26.0–34.0)
MCHC: 31.8 g/dL (ref 30.0–36.0)
MCV: 94 fL (ref 80.0–100.0)
Monocytes Absolute: 0.6 10*3/uL (ref 0.1–1.0)
Monocytes Relative: 8 %
Neutro Abs: 5.9 10*3/uL (ref 1.7–7.7)
Neutrophils Relative %: 75 %
Platelets: 232 10*3/uL (ref 150–400)
RBC: 4.01 MIL/uL — ABNORMAL LOW (ref 4.22–5.81)
RDW: 13.2 % (ref 11.5–15.5)
WBC: 7.8 10*3/uL (ref 4.0–10.5)
nRBC: 0 % (ref 0.0–0.2)

## 2021-03-13 LAB — GLUCOSE, CAPILLARY
Glucose-Capillary: 111 mg/dL — ABNORMAL HIGH (ref 70–99)
Glucose-Capillary: 137 mg/dL — ABNORMAL HIGH (ref 70–99)
Glucose-Capillary: 108 mg/dL — ABNORMAL HIGH (ref 70–99)

## 2021-03-13 LAB — MAGNESIUM: Magnesium: 2 mg/dL (ref 1.7–2.4)

## 2021-03-13 NOTE — Progress Notes (Signed)
Patient was to be discharged yesterday to skilled nursing facility however awaiting insurance approval.  Patient currently stable with stable vital signs.  No change in current course of treatment.  Awaiting placement in skilled nursing facility hopefully to be done today.  No further changes at this time.  Patient currently stable for discharge to SNF.  No charge.

## 2021-03-13 NOTE — Progress Notes (Signed)
Patient discharge and ready for transport to SNF.  Report called to RN.

## 2021-03-13 NOTE — Plan of Care (Signed)
°  Problem: Clinical Measurements: Goal: Ability to maintain clinical measurements within normal limits will improve 03/13/2021 1642 by Albina Billet, RN Outcome: Adequate for Discharge 03/13/2021 0820 by Albina Billet, RN Outcome: Progressing

## 2021-03-13 NOTE — Plan of Care (Signed)
?  Problem: Clinical Measurements: ?Goal: Will remain free from infection ?Outcome: Progressing ?  ?Problem: Clinical Measurements: ?Goal: Diagnostic test results will improve ?Outcome: Progressing ?  ?

## 2021-03-13 NOTE — TOC Progression Note (Addendum)
Transition of Care [facility]) - Progression Note    Patient Details  Name: Erik Lara MRN: [id number] Date of Birth: 1935/08/21  Transition of [facility]) CM/SW Contact  Leeroy Cha, RN Phone Number: 03/13/2021, 10:31 AM  Clinical Narrative:    Tct-health team advantage/ message left for tha call back. Tcf-HTA-auth process was started on 123022. Tcf- HTA-Patient is approved to go to [facility] x5 days before next review. Starting auth for ambulance for transport. Ambulance Josem Kaufmann is [phone]. Tct-lisa floor rn alerted that pt is for dc to [place] she will let the family know. Ptar packet doen and given to lisa. PTAR CALLED AT 1429 Expected Discharge Plan: Skilled Nursing Facility Barriers to Discharge: Continued Medical Work up  Expected Discharge Plan and Services Expected Discharge Plan: [place]         Expected Discharge Date: 03/12/21                                     Social Determinants of Health (SDOH) Interventions    Readmission Risk Interventions No flowsheet data found.

## 2021-03-14 DIAGNOSIS — E119 Type 2 diabetes mellitus without complications: Secondary | ICD-10-CM | POA: Diagnosis not present

## 2021-03-14 DIAGNOSIS — I959 Hypotension, unspecified: Secondary | ICD-10-CM | POA: Diagnosis not present

## 2021-03-14 DIAGNOSIS — R319 Hematuria, unspecified: Secondary | ICD-10-CM | POA: Diagnosis not present

## 2021-03-14 DIAGNOSIS — R627 Adult failure to thrive: Secondary | ICD-10-CM | POA: Diagnosis not present

## 2021-03-14 DIAGNOSIS — M160 Bilateral primary osteoarthritis of hip: Secondary | ICD-10-CM | POA: Diagnosis not present

## 2021-03-14 DIAGNOSIS — F0390 Unspecified dementia without behavioral disturbance: Secondary | ICD-10-CM | POA: Diagnosis not present

## 2021-03-14 DIAGNOSIS — L89150 Pressure ulcer of sacral region, unstageable: Secondary | ICD-10-CM | POA: Diagnosis not present

## 2021-03-14 DIAGNOSIS — N210 Calculus in bladder: Secondary | ICD-10-CM | POA: Diagnosis not present

## 2021-03-14 DIAGNOSIS — G3183 Dementia with Lewy bodies: Secondary | ICD-10-CM | POA: Diagnosis not present

## 2021-03-14 DIAGNOSIS — D649 Anemia, unspecified: Secondary | ICD-10-CM | POA: Diagnosis not present

## 2021-03-14 DIAGNOSIS — R262 Difficulty in walking, not elsewhere classified: Secondary | ICD-10-CM | POA: Diagnosis not present

## 2021-03-14 DIAGNOSIS — R0902 Hypoxemia: Secondary | ICD-10-CM | POA: Diagnosis not present

## 2021-03-14 DIAGNOSIS — Z7189 Other specified counseling: Secondary | ICD-10-CM | POA: Diagnosis not present

## 2021-03-14 DIAGNOSIS — R911 Solitary pulmonary nodule: Secondary | ICD-10-CM | POA: Diagnosis not present

## 2021-03-14 DIAGNOSIS — E872 Acidosis, unspecified: Secondary | ICD-10-CM | POA: Diagnosis not present

## 2021-03-14 DIAGNOSIS — Z20822 Contact with and (suspected) exposure to covid-19: Secondary | ICD-10-CM | POA: Diagnosis not present

## 2021-03-14 DIAGNOSIS — I96 Gangrene, not elsewhere classified: Secondary | ICD-10-CM | POA: Diagnosis not present

## 2021-03-14 DIAGNOSIS — G9341 Metabolic encephalopathy: Secondary | ICD-10-CM | POA: Diagnosis not present

## 2021-03-14 DIAGNOSIS — J9811 Atelectasis: Secondary | ICD-10-CM | POA: Diagnosis not present

## 2021-03-14 DIAGNOSIS — E0590 Thyrotoxicosis, unspecified without thyrotoxic crisis or storm: Secondary | ICD-10-CM | POA: Diagnosis not present

## 2021-03-14 DIAGNOSIS — E0530 Thyrotoxicosis from ectopic thyroid tissue without thyrotoxic crisis or storm: Secondary | ICD-10-CM | POA: Diagnosis not present

## 2021-03-14 DIAGNOSIS — R1312 Dysphagia, oropharyngeal phase: Secondary | ICD-10-CM | POA: Diagnosis not present

## 2021-03-14 DIAGNOSIS — R41841 Cognitive communication deficit: Secondary | ICD-10-CM | POA: Diagnosis not present

## 2021-03-14 DIAGNOSIS — E118 Type 2 diabetes mellitus with unspecified complications: Secondary | ICD-10-CM | POA: Diagnosis not present

## 2021-03-14 DIAGNOSIS — E870 Hyperosmolality and hypernatremia: Secondary | ICD-10-CM | POA: Diagnosis not present

## 2021-03-14 DIAGNOSIS — R279 Unspecified lack of coordination: Secondary | ICD-10-CM | POA: Diagnosis not present

## 2021-03-14 DIAGNOSIS — R0602 Shortness of breath: Secondary | ICD-10-CM | POA: Diagnosis not present

## 2021-03-14 DIAGNOSIS — E559 Vitamin D deficiency, unspecified: Secondary | ICD-10-CM | POA: Diagnosis not present

## 2021-03-14 DIAGNOSIS — R6520 Severe sepsis without septic shock: Secondary | ICD-10-CM | POA: Diagnosis not present

## 2021-03-14 DIAGNOSIS — U071 COVID-19: Secondary | ICD-10-CM | POA: Diagnosis not present

## 2021-03-14 DIAGNOSIS — E43 Unspecified severe protein-calorie malnutrition: Secondary | ICD-10-CM | POA: Diagnosis not present

## 2021-03-14 DIAGNOSIS — N3289 Other specified disorders of bladder: Secondary | ICD-10-CM | POA: Diagnosis not present

## 2021-03-14 DIAGNOSIS — G4700 Insomnia, unspecified: Secondary | ICD-10-CM | POA: Diagnosis not present

## 2021-03-14 DIAGNOSIS — Z8616 Personal history of COVID-19: Secondary | ICD-10-CM | POA: Diagnosis not present

## 2021-03-14 DIAGNOSIS — A419 Sepsis, unspecified organism: Secondary | ICD-10-CM | POA: Diagnosis not present

## 2021-03-14 DIAGNOSIS — E785 Hyperlipidemia, unspecified: Secondary | ICD-10-CM | POA: Diagnosis not present

## 2021-03-14 DIAGNOSIS — J309 Allergic rhinitis, unspecified: Secondary | ICD-10-CM | POA: Diagnosis not present

## 2021-03-14 DIAGNOSIS — L89616 Pressure-induced deep tissue damage of right heel: Secondary | ICD-10-CM | POA: Diagnosis not present

## 2021-03-14 DIAGNOSIS — I82612 Acute embolism and thrombosis of superficial veins of left upper extremity: Secondary | ICD-10-CM | POA: Diagnosis not present

## 2021-03-14 DIAGNOSIS — L89520 Pressure ulcer of left ankle, unstageable: Secondary | ICD-10-CM | POA: Diagnosis not present

## 2021-03-14 DIAGNOSIS — L89626 Pressure-induced deep tissue damage of left heel: Secondary | ICD-10-CM | POA: Diagnosis not present

## 2021-03-14 DIAGNOSIS — K5900 Constipation, unspecified: Secondary | ICD-10-CM | POA: Diagnosis not present

## 2021-03-14 DIAGNOSIS — I700 Atherosclerosis of aorta: Secondary | ICD-10-CM | POA: Diagnosis not present

## 2021-03-14 DIAGNOSIS — R7989 Other specified abnormal findings of blood chemistry: Secondary | ICD-10-CM | POA: Diagnosis not present

## 2021-03-14 DIAGNOSIS — Z9181 History of falling: Secondary | ICD-10-CM | POA: Diagnosis not present

## 2021-03-14 DIAGNOSIS — F0280 Dementia in other diseases classified elsewhere without behavioral disturbance: Secondary | ICD-10-CM | POA: Diagnosis not present

## 2021-03-14 DIAGNOSIS — I10 Essential (primary) hypertension: Secondary | ICD-10-CM | POA: Diagnosis not present

## 2021-03-14 DIAGNOSIS — Z79899 Other long term (current) drug therapy: Secondary | ICD-10-CM | POA: Diagnosis not present

## 2021-03-14 DIAGNOSIS — I4891 Unspecified atrial fibrillation: Secondary | ICD-10-CM | POA: Diagnosis not present

## 2021-03-14 DIAGNOSIS — L89159 Pressure ulcer of sacral region, unspecified stage: Secondary | ICD-10-CM | POA: Diagnosis not present

## 2021-03-14 DIAGNOSIS — L8990 Pressure ulcer of unspecified site, unspecified stage: Secondary | ICD-10-CM | POA: Diagnosis not present

## 2021-03-14 DIAGNOSIS — R338 Other retention of urine: Secondary | ICD-10-CM | POA: Diagnosis not present

## 2021-03-14 DIAGNOSIS — R059 Cough, unspecified: Secondary | ICD-10-CM | POA: Diagnosis not present

## 2021-03-14 DIAGNOSIS — Z66 Do not resuscitate: Secondary | ICD-10-CM | POA: Diagnosis not present

## 2021-03-14 DIAGNOSIS — H9191 Unspecified hearing loss, right ear: Secondary | ICD-10-CM | POA: Diagnosis not present

## 2021-03-14 DIAGNOSIS — M6281 Muscle weakness (generalized): Secondary | ICD-10-CM | POA: Diagnosis not present

## 2021-03-14 DIAGNOSIS — N1330 Unspecified hydronephrosis: Secondary | ICD-10-CM | POA: Diagnosis not present

## 2021-03-14 DIAGNOSIS — N179 Acute kidney failure, unspecified: Secondary | ICD-10-CM | POA: Diagnosis not present

## 2021-03-14 DIAGNOSIS — Z515 Encounter for palliative care: Secondary | ICD-10-CM | POA: Diagnosis not present

## 2021-03-15 DIAGNOSIS — I10 Essential (primary) hypertension: Secondary | ICD-10-CM | POA: Diagnosis not present

## 2021-03-15 DIAGNOSIS — D649 Anemia, unspecified: Secondary | ICD-10-CM | POA: Diagnosis not present

## 2021-03-17 DIAGNOSIS — I4891 Unspecified atrial fibrillation: Secondary | ICD-10-CM | POA: Diagnosis not present

## 2021-03-17 DIAGNOSIS — E559 Vitamin D deficiency, unspecified: Secondary | ICD-10-CM | POA: Diagnosis not present

## 2021-03-17 DIAGNOSIS — U071 COVID-19: Secondary | ICD-10-CM | POA: Diagnosis not present

## 2021-03-17 DIAGNOSIS — E0590 Thyrotoxicosis, unspecified without thyrotoxic crisis or storm: Secondary | ICD-10-CM | POA: Diagnosis not present

## 2021-03-17 DIAGNOSIS — E785 Hyperlipidemia, unspecified: Secondary | ICD-10-CM | POA: Diagnosis not present

## 2021-03-17 DIAGNOSIS — D649 Anemia, unspecified: Secondary | ICD-10-CM | POA: Diagnosis not present

## 2021-03-17 DIAGNOSIS — K5900 Constipation, unspecified: Secondary | ICD-10-CM | POA: Diagnosis not present

## 2021-03-17 DIAGNOSIS — R319 Hematuria, unspecified: Secondary | ICD-10-CM | POA: Diagnosis not present

## 2021-03-17 DIAGNOSIS — J309 Allergic rhinitis, unspecified: Secondary | ICD-10-CM | POA: Diagnosis not present

## 2021-03-17 DIAGNOSIS — G3183 Dementia with Lewy bodies: Secondary | ICD-10-CM | POA: Diagnosis not present

## 2021-03-17 DIAGNOSIS — E43 Unspecified severe protein-calorie malnutrition: Secondary | ICD-10-CM | POA: Diagnosis not present

## 2021-03-17 DIAGNOSIS — G4700 Insomnia, unspecified: Secondary | ICD-10-CM | POA: Diagnosis not present

## 2021-03-18 DIAGNOSIS — E43 Unspecified severe protein-calorie malnutrition: Secondary | ICD-10-CM | POA: Diagnosis not present

## 2021-03-18 DIAGNOSIS — K5900 Constipation, unspecified: Secondary | ICD-10-CM | POA: Diagnosis not present

## 2021-03-18 DIAGNOSIS — U071 COVID-19: Secondary | ICD-10-CM | POA: Diagnosis not present

## 2021-03-18 DIAGNOSIS — D649 Anemia, unspecified: Secondary | ICD-10-CM | POA: Diagnosis not present

## 2021-03-18 DIAGNOSIS — I4891 Unspecified atrial fibrillation: Secondary | ICD-10-CM | POA: Diagnosis not present

## 2021-03-18 DIAGNOSIS — E0590 Thyrotoxicosis, unspecified without thyrotoxic crisis or storm: Secondary | ICD-10-CM | POA: Diagnosis not present

## 2021-03-18 DIAGNOSIS — R319 Hematuria, unspecified: Secondary | ICD-10-CM | POA: Diagnosis not present

## 2021-03-18 DIAGNOSIS — E0530 Thyrotoxicosis from ectopic thyroid tissue without thyrotoxic crisis or storm: Secondary | ICD-10-CM | POA: Diagnosis not present

## 2021-03-18 DIAGNOSIS — J309 Allergic rhinitis, unspecified: Secondary | ICD-10-CM | POA: Diagnosis not present

## 2021-03-18 DIAGNOSIS — G4700 Insomnia, unspecified: Secondary | ICD-10-CM | POA: Diagnosis not present

## 2021-03-18 DIAGNOSIS — E785 Hyperlipidemia, unspecified: Secondary | ICD-10-CM | POA: Diagnosis not present

## 2021-03-18 DIAGNOSIS — L8990 Pressure ulcer of unspecified site, unspecified stage: Secondary | ICD-10-CM | POA: Diagnosis not present

## 2021-03-18 DIAGNOSIS — E559 Vitamin D deficiency, unspecified: Secondary | ICD-10-CM | POA: Diagnosis not present

## 2021-03-22 ENCOUNTER — Telehealth: Payer: Self-pay | Admitting: Adult Health

## 2021-03-22 NOTE — Telephone Encounter (Signed)
Tried calling patient to schedule Medicare Annual Wellness Visit (AWV) either virtually or in office.   No answer  Last AWV 08/09/11  please schedule at anytime with [facility] Nurse Health Advisor 1 or 2   This should be a 45 minute visit.

## 2021-03-31 ENCOUNTER — Emergency Department (HOSPITAL_COMMUNITY): Payer: PPO

## 2021-03-31 ENCOUNTER — Encounter (HOSPITAL_COMMUNITY): Payer: Self-pay | Admitting: Emergency Medicine

## 2021-03-31 ENCOUNTER — Inpatient Hospital Stay (HOSPITAL_COMMUNITY): Payer: PPO

## 2021-03-31 ENCOUNTER — Inpatient Hospital Stay (HOSPITAL_COMMUNITY)
Admission: EM | Admit: 2021-03-31 | Discharge: 2021-05-12 | DRG: 871 | Disposition: E | Payer: PPO | Source: Skilled Nursing Facility | Attending: Family Medicine | Admitting: Family Medicine

## 2021-03-31 ENCOUNTER — Other Ambulatory Visit: Payer: Self-pay

## 2021-03-31 DIAGNOSIS — I96 Gangrene, not elsewhere classified: Secondary | ICD-10-CM | POA: Diagnosis not present

## 2021-03-31 DIAGNOSIS — L89892 Pressure ulcer of other site, stage 2: Secondary | ICD-10-CM | POA: Diagnosis present

## 2021-03-31 DIAGNOSIS — I959 Hypotension, unspecified: Secondary | ICD-10-CM | POA: Diagnosis present

## 2021-03-31 DIAGNOSIS — R0902 Hypoxemia: Secondary | ICD-10-CM | POA: Diagnosis not present

## 2021-03-31 DIAGNOSIS — Z66 Do not resuscitate: Secondary | ICD-10-CM | POA: Diagnosis not present

## 2021-03-31 DIAGNOSIS — N281 Cyst of kidney, acquired: Secondary | ICD-10-CM | POA: Diagnosis not present

## 2021-03-31 DIAGNOSIS — E43 Unspecified severe protein-calorie malnutrition: Secondary | ICD-10-CM | POA: Diagnosis not present

## 2021-03-31 DIAGNOSIS — R0602 Shortness of breath: Secondary | ICD-10-CM | POA: Diagnosis not present

## 2021-03-31 DIAGNOSIS — I4891 Unspecified atrial fibrillation: Secondary | ICD-10-CM | POA: Diagnosis present

## 2021-03-31 DIAGNOSIS — F0280 Dementia in other diseases classified elsewhere without behavioral disturbance: Secondary | ICD-10-CM | POA: Diagnosis not present

## 2021-03-31 DIAGNOSIS — A419 Sepsis, unspecified organism: Principal | ICD-10-CM

## 2021-03-31 DIAGNOSIS — L89150 Pressure ulcer of sacral region, unstageable: Secondary | ICD-10-CM | POA: Diagnosis not present

## 2021-03-31 DIAGNOSIS — R7989 Other specified abnormal findings of blood chemistry: Secondary | ICD-10-CM | POA: Diagnosis not present

## 2021-03-31 DIAGNOSIS — N210 Calculus in bladder: Secondary | ICD-10-CM | POA: Diagnosis not present

## 2021-03-31 DIAGNOSIS — I10 Essential (primary) hypertension: Secondary | ICD-10-CM | POA: Diagnosis not present

## 2021-03-31 DIAGNOSIS — E870 Hyperosmolality and hypernatremia: Secondary | ICD-10-CM

## 2021-03-31 DIAGNOSIS — E119 Type 2 diabetes mellitus without complications: Secondary | ICD-10-CM | POA: Diagnosis not present

## 2021-03-31 DIAGNOSIS — L89626 Pressure-induced deep tissue damage of left heel: Secondary | ICD-10-CM | POA: Diagnosis not present

## 2021-03-31 DIAGNOSIS — G3183 Dementia with Lewy bodies: Secondary | ICD-10-CM | POA: Diagnosis present

## 2021-03-31 DIAGNOSIS — Z79899 Other long term (current) drug therapy: Secondary | ICD-10-CM | POA: Diagnosis not present

## 2021-03-31 DIAGNOSIS — H9191 Unspecified hearing loss, right ear: Secondary | ICD-10-CM | POA: Diagnosis present

## 2021-03-31 DIAGNOSIS — L089 Local infection of the skin and subcutaneous tissue, unspecified: Secondary | ICD-10-CM

## 2021-03-31 DIAGNOSIS — R338 Other retention of urine: Secondary | ICD-10-CM | POA: Diagnosis not present

## 2021-03-31 DIAGNOSIS — I700 Atherosclerosis of aorta: Secondary | ICD-10-CM | POA: Diagnosis not present

## 2021-03-31 DIAGNOSIS — R627 Adult failure to thrive: Secondary | ICD-10-CM | POA: Diagnosis not present

## 2021-03-31 DIAGNOSIS — U071 COVID-19: Secondary | ICD-10-CM | POA: Diagnosis present

## 2021-03-31 DIAGNOSIS — Z8616 Personal history of COVID-19: Secondary | ICD-10-CM

## 2021-03-31 DIAGNOSIS — L89520 Pressure ulcer of left ankle, unstageable: Secondary | ICD-10-CM | POA: Diagnosis not present

## 2021-03-31 DIAGNOSIS — E872 Acidosis, unspecified: Secondary | ICD-10-CM | POA: Diagnosis not present

## 2021-03-31 DIAGNOSIS — L89159 Pressure ulcer of sacral region, unspecified stage: Secondary | ICD-10-CM | POA: Diagnosis not present

## 2021-03-31 DIAGNOSIS — R339 Retention of urine, unspecified: Secondary | ICD-10-CM | POA: Diagnosis present

## 2021-03-31 DIAGNOSIS — R6520 Severe sepsis without septic shock: Secondary | ICD-10-CM | POA: Diagnosis not present

## 2021-03-31 DIAGNOSIS — Z515 Encounter for palliative care: Secondary | ICD-10-CM

## 2021-03-31 DIAGNOSIS — J9811 Atelectasis: Secondary | ICD-10-CM | POA: Diagnosis not present

## 2021-03-31 DIAGNOSIS — R059 Cough, unspecified: Secondary | ICD-10-CM | POA: Diagnosis not present

## 2021-03-31 DIAGNOSIS — N179 Acute kidney failure, unspecified: Secondary | ICD-10-CM | POA: Diagnosis not present

## 2021-03-31 DIAGNOSIS — Z6820 Body mass index (BMI) 20.0-20.9, adult: Secondary | ICD-10-CM

## 2021-03-31 DIAGNOSIS — G9341 Metabolic encephalopathy: Secondary | ICD-10-CM | POA: Diagnosis not present

## 2021-03-31 DIAGNOSIS — L89616 Pressure-induced deep tissue damage of right heel: Secondary | ICD-10-CM | POA: Diagnosis not present

## 2021-03-31 DIAGNOSIS — E0590 Thyrotoxicosis, unspecified without thyrotoxic crisis or storm: Secondary | ICD-10-CM | POA: Diagnosis present

## 2021-03-31 DIAGNOSIS — R911 Solitary pulmonary nodule: Secondary | ICD-10-CM | POA: Diagnosis not present

## 2021-03-31 DIAGNOSIS — L8990 Pressure ulcer of unspecified site, unspecified stage: Secondary | ICD-10-CM | POA: Diagnosis present

## 2021-03-31 DIAGNOSIS — Z20822 Contact with and (suspected) exposure to covid-19: Secondary | ICD-10-CM | POA: Diagnosis present

## 2021-03-31 DIAGNOSIS — N1330 Unspecified hydronephrosis: Secondary | ICD-10-CM | POA: Diagnosis not present

## 2021-03-31 DIAGNOSIS — M160 Bilateral primary osteoarthritis of hip: Secondary | ICD-10-CM | POA: Diagnosis not present

## 2021-03-31 DIAGNOSIS — E785 Hyperlipidemia, unspecified: Secondary | ICD-10-CM | POA: Diagnosis present

## 2021-03-31 DIAGNOSIS — N3289 Other specified disorders of bladder: Secondary | ICD-10-CM | POA: Diagnosis not present

## 2021-03-31 DIAGNOSIS — R4182 Altered mental status, unspecified: Secondary | ICD-10-CM | POA: Diagnosis not present

## 2021-03-31 DIAGNOSIS — Z7189 Other specified counseling: Secondary | ICD-10-CM

## 2021-03-31 DIAGNOSIS — L89890 Pressure ulcer of other site, unstageable: Secondary | ICD-10-CM | POA: Diagnosis present

## 2021-03-31 DIAGNOSIS — R058 Other specified cough: Secondary | ICD-10-CM

## 2021-03-31 DIAGNOSIS — E876 Hypokalemia: Secondary | ICD-10-CM | POA: Diagnosis present

## 2021-03-31 LAB — RESP PANEL BY RT-PCR (FLU A&B, COVID) ARPGX2
Influenza A by PCR: NEGATIVE
Influenza B by PCR: NEGATIVE
SARS Coronavirus 2 by RT PCR: POSITIVE — AB

## 2021-03-31 LAB — CBC WITH DIFFERENTIAL/PLATELET
Abs Immature Granulocytes: 0.22 10*3/uL — ABNORMAL HIGH (ref 0.00–0.07)
Basophils Absolute: 0.1 10*3/uL (ref 0.0–0.1)
Basophils Relative: 0 %
Eosinophils Absolute: 0 10*3/uL (ref 0.0–0.5)
Eosinophils Relative: 0 %
HCT: 44.5 % (ref 39.0–52.0)
Hemoglobin: 13.6 g/dL (ref 13.0–17.0)
Immature Granulocytes: 1 %
Lymphocytes Relative: 2 %
Lymphs Abs: 0.5 10*3/uL — ABNORMAL LOW (ref 0.7–4.0)
MCH: 29.6 pg (ref 26.0–34.0)
MCHC: 30.6 g/dL (ref 30.0–36.0)
MCV: 96.9 fL (ref 80.0–100.0)
Monocytes Absolute: 0.7 10*3/uL (ref 0.1–1.0)
Monocytes Relative: 3 %
Neutro Abs: 24.8 10*3/uL — ABNORMAL HIGH (ref 1.7–7.7)
Neutrophils Relative %: 94 %
Platelets: 337 10*3/uL (ref 150–400)
RBC: 4.59 MIL/uL (ref 4.22–5.81)
RDW: 14.1 % (ref 11.5–15.5)
WBC: 26.3 10*3/uL — ABNORMAL HIGH (ref 4.0–10.5)
nRBC: 0 % (ref 0.0–0.2)

## 2021-03-31 LAB — URINALYSIS, ROUTINE W REFLEX MICROSCOPIC
Bilirubin Urine: NEGATIVE
Glucose, UA: NEGATIVE mg/dL
Hgb urine dipstick: NEGATIVE
Ketones, ur: NEGATIVE mg/dL
Leukocytes,Ua: NEGATIVE
Nitrite: NEGATIVE
Protein, ur: NEGATIVE mg/dL
Specific Gravity, Urine: 1.015 (ref 1.005–1.030)
pH: 5 (ref 5.0–8.0)

## 2021-03-31 LAB — COMPREHENSIVE METABOLIC PANEL
ALT: 39 U/L (ref 0–44)
AST: 23 U/L (ref 15–41)
Albumin: 2.6 g/dL — ABNORMAL LOW (ref 3.5–5.0)
Alkaline Phosphatase: 89 U/L (ref 38–126)
Anion gap: 12 (ref 5–15)
BUN: 102 mg/dL — ABNORMAL HIGH (ref 8–23)
CO2: 24 mmol/L (ref 22–32)
Calcium: 9.8 mg/dL (ref 8.9–10.3)
Chloride: 114 mmol/L — ABNORMAL HIGH (ref 98–111)
Creatinine, Ser: 2.42 mg/dL — ABNORMAL HIGH (ref 0.61–1.24)
GFR, Estimated: 26 mL/min — ABNORMAL LOW (ref >60)
Glucose, Bld: 159 mg/dL — ABNORMAL HIGH (ref 70–99)
Potassium: 4.5 mmol/L (ref 3.5–5.1)
Sodium: 150 mmol/L — ABNORMAL HIGH (ref 135–145)
Total Bilirubin: 1.1 mg/dL (ref 0.3–1.2)
Total Protein: 6.8 g/dL (ref 6.5–8.1)

## 2021-03-31 LAB — CBC
HCT: 36.4 % — ABNORMAL LOW (ref 39.0–52.0)
Hemoglobin: 11.4 g/dL — ABNORMAL LOW (ref 13.0–17.0)
MCH: 29.8 pg (ref 26.0–34.0)
MCHC: 31.3 g/dL (ref 30.0–36.0)
MCV: 95.3 fL (ref 80.0–100.0)
Platelets: 229 10*3/uL (ref 150–400)
RBC: 3.82 MIL/uL — ABNORMAL LOW (ref 4.22–5.81)
RDW: 14.1 % (ref 11.5–15.5)
WBC: 27.4 10*3/uL — ABNORMAL HIGH (ref 4.0–10.5)
nRBC: 0 % (ref 0.0–0.2)

## 2021-03-31 LAB — PROTIME-INR
INR: 1.3 — ABNORMAL HIGH (ref 0.8–1.2)
Prothrombin Time: 15.7 seconds — ABNORMAL HIGH (ref 11.4–15.2)

## 2021-03-31 LAB — CREATININE, SERUM
Creatinine, Ser: 1.54 mg/dL — ABNORMAL HIGH (ref 0.61–1.24)
GFR, Estimated: 44 mL/min — ABNORMAL LOW (ref >60)

## 2021-03-31 LAB — GLUCOSE, CAPILLARY: Glucose-Capillary: 105 mg/dL — ABNORMAL HIGH (ref 70–99)

## 2021-03-31 LAB — LACTIC ACID, PLASMA
Lactic Acid, Venous: 3.5 mmol/L (ref 0.5–1.9)
Lactic Acid, Venous: 2.5 mmol/L (ref 0.5–1.9)
Lactic Acid, Venous: 2.6 mmol/L (ref 0.5–1.9)

## 2021-03-31 LAB — APTT: aPTT: 25 seconds (ref 24–36)

## 2021-03-31 MED ORDER — SODIUM CHLORIDE 0.9 % IV SOLN
2.0000 g | Freq: Once | INTRAVENOUS | Status: AC
  Administered 2021-03-31: 2 g via INTRAVENOUS
  Filled 2021-03-31: qty 20

## 2021-03-31 MED ORDER — LACTATED RINGERS IV BOLUS (SEPSIS)
1000.0000 mL | Freq: Once | INTRAVENOUS | Status: AC
  Administered 2021-03-31: 1000 mL via INTRAVENOUS

## 2021-03-31 MED ORDER — SODIUM CHLORIDE 0.9 % IV SOLN
2.0000 g | INTRAVENOUS | Status: DC
  Administered 2021-03-31: 2 g via INTRAVENOUS
  Filled 2021-03-31 (×2): qty 2

## 2021-03-31 MED ORDER — ONDANSETRON HCL 4 MG PO TABS: 4.0000 mg | ORAL_TABLET | Freq: Four times a day (QID) | ORAL | Status: DC | PRN

## 2021-03-31 MED ORDER — HEPARIN SODIUM (PORCINE) 5000 UNIT/ML IJ SOLN
5000.0000 [IU] | Freq: Three times a day (TID) | INTRAMUSCULAR | Status: DC
  Administered 2021-03-31 – 2021-04-05 (×13): 5000 [IU] via SUBCUTANEOUS
  Filled 2021-03-31 (×14): qty 1

## 2021-03-31 MED ORDER — LACTATED RINGERS IV BOLUS (SEPSIS)
500.0000 mL | Freq: Once | INTRAVENOUS | Status: AC
  Administered 2021-03-31: 500 mL via INTRAVENOUS

## 2021-03-31 MED ORDER — LACTATED RINGERS IV BOLUS
1000.0000 mL | Freq: Once | INTRAVENOUS | Status: AC
Start: 2021-03-31 — End: 2021-03-31
  Administered 2021-03-31: 1000 mL via INTRAVENOUS

## 2021-03-31 MED ORDER — LACTATED RINGERS IV SOLN: INTRAVENOUS | Status: AC

## 2021-03-31 MED ORDER — METRONIDAZOLE 500 MG/100ML IV SOLN
500.0000 mg | Freq: Two times a day (BID) | INTRAVENOUS | Status: DC
  Administered 2021-03-31 – 2021-04-03 (×6): 500 mg via INTRAVENOUS
  Filled 2021-03-31 (×6): qty 100

## 2021-03-31 MED ORDER — ONDANSETRON HCL 4 MG/2ML IJ SOLN: 4.0000 mg | Freq: Four times a day (QID) | INTRAMUSCULAR | Status: DC | PRN

## 2021-03-31 MED ORDER — CHLORHEXIDINE GLUCONATE CLOTH 2 % EX PADS
6.0000 | MEDICATED_PAD | Freq: Every day | CUTANEOUS | Status: DC
  Administered 2021-04-01 – 2021-04-11 (×10): 6 via TOPICAL

## 2021-03-31 MED ORDER — VANCOMYCIN HCL 1500 MG/300ML IV SOLN
1500.0000 mg | Freq: Once | INTRAVENOUS | Status: AC
Start: 2021-03-31 — End: 2021-03-31
  Administered 2021-03-31: 1500 mg via INTRAVENOUS
  Filled 2021-03-31: qty 300

## 2021-03-31 MED ORDER — IPRATROPIUM-ALBUTEROL 0.5-2.5 (3) MG/3ML IN SOLN: 3.0000 mL | Freq: Four times a day (QID) | RESPIRATORY_TRACT | Status: DC | PRN

## 2021-03-31 MED ORDER — VANCOMYCIN HCL 1500 MG/300ML IV SOLN: 1500.0000 mg | INTRAVENOUS | Status: DC

## 2021-03-31 NOTE — Progress Notes (Signed)
Pharmacy Antibiotic Note  Erik Lara is a 86 y.o. male with sacrum wound presented the ED on 04/01/2021 with c/o SOB. Pharmacy has been consulted to dose vancomycin, cefepime, and flagyl for wound infection.  - scr 2.42 (crcl~25)  Plan: - cefepime 2gm q24h for crcl 11-29 - flagyl 500 mg IV q12h - vanc 1500 mg IV q48h for est AUC 500  _____________________________________  Height: 6\' 1"  (185.4 cm) Weight: 81.4 kg (179 lb 7.3 oz) IBW/kg (Calculated) : 79.9  No data recorded.  Recent Labs  Lab 03/16/2021 0840 03/24/2021 0849 03/20/2021 1146  WBC 26.3*  --   --   CREATININE 2.42*  --   --   LATICACIDVEN  --  3.5* 2.5*    Estimated Creatinine Clearance: 25.7 mL/min (A) (by C-G formula based on SCr of 2.42 mg/dL (H)).    No Known Allergies   Thank you for allowing pharmacy to be a part of this patients care.  Lynelle Doctor 03/21/2021 1:10 PM

## 2021-03-31 NOTE — ED Notes (Signed)
Patient walked to bathroom without any difficulty.

## 2021-03-31 NOTE — ED Notes (Signed)
MD at bedside to assess pt sacrum wound.

## 2021-03-31 NOTE — Sepsis Progress Note (Signed)
eLink is following this Code Sepsis. °

## 2021-03-31 NOTE — ED Triage Notes (Signed)
Pt BIB EMS from [facility], staff reported SOB and copious amounts of foam/secretions from mouth. Placed on 3 liters from baseline RA. Productive cough with history of Covid. Pt alert and nonverbal, follows simple command.  BP 120/86 P 94 RR 20-22 T 96.9 CBG 196

## 2021-03-31 NOTE — Progress Notes (Signed)
Manufacturing engineer [facility]) Hospital Liaison note.    This patient is a new referral for [facility] hospice services at [facility]. He has not been admitted to hospice services prior to coming to the [facility] ED. Hospital liaison will follow for disposition and coordination of care.   Please do not hesitate to call with questions.   Thank you,   Farrel Gordon, RN, [place] Hospital Liaison   [phone]

## 2021-03-31 NOTE — Progress Notes (Signed)
A consult was received from an ED physician for vancomycin per pharmacy dosing.  The patient's profile has been reviewed for ht/wt/allergies/indication/available labs.    A one time order has been placed for vancomycin 1500 mg IV x1 .  Further antibiotics/pharmacy consults should be ordered by admitting physician if indicated.                       Thank you, Lynelle Doctor 03/27/2021  11:22 AM

## 2021-03-31 NOTE — ED Notes (Signed)
Update provided to pt's daughter via bedside at this time.

## 2021-03-31 NOTE — ED Notes (Signed)
Surgical consult at this time.

## 2021-03-31 NOTE — ED Notes (Addendum)
Patient transported to CT 

## 2021-03-31 NOTE — ED Notes (Signed)
Patient returned back from CT at this time.  °

## 2021-03-31 NOTE — Consult Note (Signed)
Erik Lara 05/10/1935  [id number].    Requesting MD: Eulis Foster MD Chief Complaint/Reason for Consult: sepsis, decubitus ulcer   HPI:  Erik Lara is an 86 y/o M with multiple medical problems including, but not limited to, advanced dementia and recent hospitalization 12/16-12/30/2022 for COVID-19 who presented from SNF due to hypoxia and shortness of breath. The patient himself is unable to provide history.   ED workup significant for hypotension (86/43), leukocytosis (26.3), hypernatremia (150), AKI w/ BUN 102/Cr 2.42, and lactic acid of 3.5. Due to concern for sepsis of unknown primary source, with known enlarging decubitus ulcer, general surgery has been asked to evaluate the patient for possible infected sacral decubitus ulcer.  UA negative. CXR is without effusion or opacity. Patient currently oxygenating on room air. CT chest and pelvis are pending.  ROS: Review of Systems  Unable to perform ROS: Dementia   Family History  Problem Relation Age of Onset   Cancer Other        breast   Diabetes Other     Past Medical History:  Diagnosis Date   Gallstones    asymptomatic   Hearing loss of right ear 01/23/2017   Hepatic Cysts    Hyperlipidemia    Hyperthyroidism 01/28/2013   Mild neurocognitive disorder 09/13/2016   Prostatitis    requiring a trip to the ER in [place] with a fever - 09   Type 2 diabetes mellitus without complication, without long-term current use of insulin ([place]) 03/30/2015    Past Surgical History:  Procedure Laterality Date   HERNIA REPAIR     Childhood    Social History:  reports that he has never smoked. He has never used smokeless tobacco. He reports that he does not currently use alcohol. He reports that he does not use drugs.  Allergies: No Known Allergies  (Not in a hospital admission)    Physical Exam: Blood pressure (!) 106/38, pulse 92, resp. rate 18, height 6\' 1"  (1.854 m), weight 81.4 kg, SpO2 100 %. General: Elderly  white male, chronically ill appearing, NAD. HEENT: head -normocephalic, atraumatic; Eyes: PERRLA, no conjunctival injection; Ears- no external lesions or tenderness, TM visible with no redness or bulging; Nose: nonerythematous, no polyps/masses; Throat: pink mucosa, uvula midline, no exudates.  Neck- Trachea is midline, no thyromegaly or JVD appreciated.  CV- RRR, normal S1/S2, no M/R/G, palpable pedal pulses, pedal edema is present Pulm- breathing is non-labored on room air. Rhonchi present bilateral anterior and lateral lower lung fields L>R Abd- soft, NT/ND, no masses, hernias, or organomegaly. GU- foley in place draining clear, yellow urine Large, foul-smelling necrotic sacral wound 2 cm above the anus, no significant fluctuance. Minimal surrounding cellulitis of the left buttock. Palpation of the wound does not cause drainage. There is no tunneling currently.     MSK- UE/LE symmetrical, diffuse muscular rigidity Neuro - awake, not verbalizing, not reliably following commands  Psych- unable to assess, not verbalizing. Skin: warm and dry, no rashes or lesions   Results for orders placed or performed during the hospital encounter of 03/18/2021 (from the past 48 hour(s))  Comprehensive metabolic panel     Status: Abnormal   Collection Time: 03/29/2021  8:40 AM  Result Value Ref Range   Sodium 150 (H) 135 - 145 mmol/L   Potassium 4.5 3.5 - 5.1 mmol/L   Chloride 114 (H) 98 - 111 mmol/L   CO2 24 22 - 32 mmol/L   Glucose, Bld 159 (H) 70 -  99 mg/dL    Comment: Glucose reference range applies only to samples taken after fasting for at least 8 hours.   BUN 102 (H) 8 - 23 mg/dL    Comment: RESULTS CONFIRMED BY MANUAL DILUTION   Creatinine, Ser 2.42 (H) 0.61 - 1.24 mg/dL   Calcium 9.8 8.9 - 10.3 mg/dL   Total Protein 6.8 6.5 - 8.1 g/dL   Albumin 2.6 (L) 3.5 - 5.0 g/dL   AST 23 15 - 41 U/L   ALT 39 0 - 44 U/L   Alkaline Phosphatase 89 38 - 126 U/L   Total Bilirubin 1.1 0.3 - 1.2 mg/dL   GFR,  Estimated 26 (L) >60 mL/min    Comment: (NOTE) Calculated using the CKD-EPI Creatinine Equation (2021)    Anion gap 12 5 - 15    Comment: Performed at [facility], [place] [address] [place], [place] [address]  CBC WITH DIFFERENTIAL     Status: Abnormal   Collection Time: 04/09/2021  8:40 AM  Result Value Ref Range   WBC 26.3 (H) 4.0 - 10.5 K/uL   RBC 4.59 4.22 - 5.81 MIL/uL   Hemoglobin 13.6 13.0 - 17.0 g/dL   HCT 44.5 39.0 - 52.0 %   MCV 96.9 80.0 - 100.0 fL   MCH 29.6 26.0 - 34.0 pg   MCHC 30.6 30.0 - 36.0 g/dL   RDW 14.1 11.5 - 15.5 %   Platelets 337 150 - 400 K/uL   nRBC 0.0 0.0 - 0.2 %   Neutrophils Relative % 94 %   Neutro Abs 24.8 (H) 1.7 - 7.7 K/uL   Lymphocytes Relative 2 %   Lymphs Abs 0.5 (L) 0.7 - 4.0 K/uL   Monocytes Relative 3 %   Monocytes Absolute 0.7 0.1 - 1.0 K/uL   Eosinophils Relative 0 %   Eosinophils Absolute 0.0 0.0 - 0.5 K/uL   Basophils Relative 0 %   Basophils Absolute 0.1 0.0 - 0.1 K/uL   Immature Granulocytes 1 %   Abs Immature Granulocytes 0.22 (H) 0.00 - 0.07 K/uL    Comment: Performed at [facility], [place] [address], [place], [place] [address]  Protime-INR     Status: Abnormal   Collection Time: 03/30/2021  8:40 AM  Result Value Ref Range   Prothrombin Time 15.7 (H) 11.4 - 15.2 seconds   INR 1.3 (H) 0.8 - 1.2    Comment: (NOTE) INR goal varies based on device and disease states. Performed at [facility], [place] [address] [place], [place] [address]   APTT     Status: None   Collection Time: 03/30/2021  8:40 AM  Result Value Ref Range   aPTT 25 24 - 36 seconds    Comment: Performed at [facility], [place] [address], [place], [place] [address]  Urinalysis, Routine w reflex microscopic Nasopharyngeal Swab     Status: None   Collection Time: 04/03/2021  8:47 AM  Result Value Ref Range   Color, Urine YELLOW YELLOW   APPearance CLEAR CLEAR   Specific Gravity, Urine 1.015 1.005  - 1.030   pH 5.0 5.0 - 8.0   Glucose, UA NEGATIVE NEGATIVE mg/dL   Hgb urine dipstick NEGATIVE NEGATIVE   Bilirubin Urine NEGATIVE NEGATIVE   Ketones, ur NEGATIVE NEGATIVE mg/dL   Protein, ur NEGATIVE NEGATIVE mg/dL   Nitrite NEGATIVE NEGATIVE   Leukocytes,Ua NEGATIVE NEGATIVE    Comment: Performed at [facility], [place] [address] [place], [place]  [address]  Lactic acid, plasma     Status: Abnormal   Collection Time: 03/20/2021  8:49 AM  Result Value Ref Range   Lactic Acid, Venous 3.5 (HH) 0.5 - 1.9 mmol/L    Comment: CRITICAL RESULT CALLED TO, READ BACK BY AND VERIFIED WITH: BRATU,D. EMTP AT 0936 04/13/2021 MULLINS,T Performed at [facility], [place] [address] [place], [place] [address]   Resp Panel by RT-PCR (Flu A&B, Covid) Nasopharyngeal Swab     Status: Abnormal   Collection Time: 04/08/2021  8:49 AM   Specimen: Nasopharyngeal Swab; Nasopharyngeal(NP) swabs in vial transport medium  Result Value Ref Range   SARS Coronavirus 2 by RT PCR POSITIVE (A) NEGATIVE    Comment: (NOTE) SARS-CoV-2 target nucleic acids are DETECTED.  The SARS-CoV-2 RNA is generally detectable in upper respiratory specimens during the acute phase of infection. Positive results are indicative of the presence of the identified virus, but do not rule out bacterial infection or co-infection with other pathogens not detected by the test. Clinical correlation with patient history and other diagnostic information is necessary to determine patient infection status. The expected result is Negative.  Fact Sheet for Patients: EntrepreneurPulse.com.au  Fact Sheet for Healthcare Providers: IncredibleEmployment.be  This test is not yet approved or cleared by the [place] FDA and  has been authorized for detection and/or diagnosis of SARS-CoV-2 by FDA under an Emergency Use Authorization (EUA).  This EUA will remain in effect (meaning this  test can be used) for the duration of  the COVID-19 declaration under Section 564(b)(1) of the A ct, 21 U.S.C. section 360bbb-3(b)(1), unless the authorization is terminated or revoked sooner.     Influenza A by PCR NEGATIVE NEGATIVE   Influenza B by PCR NEGATIVE NEGATIVE    Comment: (NOTE) The Xpert Xpress SARS-CoV-2/FLU/RSV plus assay is intended as an aid in the diagnosis of influenza from Nasopharyngeal swab specimens and should not be used as a sole basis for treatment. Nasal washings and aspirates are unacceptable for Xpert Xpress SARS-CoV-2/FLU/RSV testing.  Fact Sheet for Patients: EntrepreneurPulse.com.au  Fact Sheet for Healthcare Providers: IncredibleEmployment.be  This test is not yet approved or cleared by the [place] FDA and has been authorized for detection and/or diagnosis of SARS-CoV-2 by FDA under an Emergency Use Authorization (EUA). This EUA will remain in effect (meaning this test can be used) for the duration of the COVID-19 declaration under Section 564(b)(1) of the Act, 21 U.S.C. section 360bbb-3(b)(1), unless the authorization is terminated or revoked.  Performed at [facility], [place] [address] [place], [place] [address]   Lactic acid, plasma     Status: Abnormal   Collection Time: 03/26/2021 11:46 AM  Result Value Ref Range   Lactic Acid, Venous 2.5 (HH) 0.5 - 1.9 mmol/L    Comment: CRITICAL VALUE NOTED.  VALUE IS CONSISTENT WITH PREVIOUSLY REPORTED AND CALLED VALUE. Performed at [facility], [place] [address] [place], [place] [address]    DG Chest Port 1 View  Result Date: 04/02/2021 CLINICAL DATA:  Sepsis EXAM: PORTABLE CHEST 1 VIEW COMPARISON:  Chest x-ray 02/28/2021 FINDINGS: Heart size and mediastinal contours are within normal limits. No suspicious pulmonary opacities identified. No pleural effusion or pneumothorax visualized. No acute osseous abnormality appreciated.  IMPRESSION: No acute intrathoracic process identified. Electronically Signed   By: Ofilia Neas M.D.   On: 03/14/2021 09:03      Assessment/Plan Necrotic sacral decubitus ulcer, unstagable - would benefit from bedside vs operative debridement.  Will order PT hydrotherapy and discuss with MD. Most of the necrotic tissue is thin and soft, may respond well to bedside debridement. Given patients age, comorbidities, and advanced dementia, would like to avoid placing him under a general anesthetic.  - recommend broad spectrum antibiotics for now. This wound is necrotic but without significant purulence or cellulitis, unsure it is the sole source of his sepsis. Will await CT pelvis to assess for underlying abscess. - pt also has bilateral lower extremity wounds - will consult WOC RN for assessment and wound care orders  Sepsis - DDx - infected decubitus ulcer, pneumonia Acute encephalopathy COVID-19 - admitted 12/16 due to this, received IV remdesivir 12/21-12/23 Diabetes Mellitus, type II  HLD Lewy body dementia Hyperthyroidism PMH a.fib with RVR in the setting of acute illness (COVID) - not currently on amiodarone or anticoagulation. Physical deconditioning    FEN - NPO, IVF VTE - SCD's, ok for chemical DVT ppx from CCS perspective  ID - Rocephin/vanc  Admit - to [place] 03/16/2021, 12:30 PM Please see Amion for pager number during day hours 7:00am-4:30pm or 7:00am -11:30am on weekends

## 2021-03-31 NOTE — ED Notes (Signed)
Writer inserted in and out catheter to obtained urine specimen. Noted with 1236mL output, Foley inserted at this time.

## 2021-03-31 NOTE — ED Notes (Signed)
Update provided to pt's son via phone call.

## 2021-03-31 NOTE — ED Provider Notes (Signed)
[facility] DEPT Provider Note   CSN: [id number] Arrival date & time: 04/12/2021  9678     History  Chief Complaint  Patient presents with   Shortness of Breath    Erik Lara is a 86 y.o. male.  HPI He presents from his nursing care facility for evaluation of shortness of breath, hypoxia and productive cough.  He was recently placed there following a hospital admission.  He was admitted to the SNF on 03/14/2021.  He has a history of Lewy body dementia, diabetes and is deconditioned.  He is full code/full scope of treatment.  Level 5 caveat-unable to give history.    Home Medications Prior to Admission medications   Medication Sig Start Date End Date Taking? Authorizing Provider  acetaminophen (TYLENOL) 325 MG tablet Take 2 tablets (650 mg total) by mouth every 6 (six) hours as needed for mild pain (or Fever >/= 101). 03/12/21   Eugenie Filler, MD  ascorbic acid (VITAMIN C) 500 MG tablet Take 1 tablet (500 mg total) by mouth daily. 03/13/21   Eugenie Filler, MD  cetirizine (ZYRTEC) 10 MG tablet Take 10 mg by mouth daily.    [provider]  cholecalciferol (VITAMIN D) 25 MCG tablet Take 1 tablet (1,000 Units total) by mouth daily. 03/13/21   Eugenie Filler, MD  donepezil (ARICEPT) 10 MG tablet Take 1 tablet (10 mg total) by mouth at bedtime. 10/13/20   Cameron Sprang, MD  feeding supplement (ENSURE ENLIVE / ENSURE PLUS) LIQD Take 237 mLs by mouth 3 (three) times daily between meals. 03/12/21   Eugenie Filler, MD  melatonin 3 MG TABS tablet Take 1 tablet (3 mg total) by mouth at bedtime. 03/12/21   Eugenie Filler, MD  memantine (NAMENDA) 10 MG tablet Take 1 tablet twice a day Patient taking differently: Take 10 mg by mouth 2 (two) times daily. 12/11/20   Cameron Sprang, MD  Multiple Vitamin (MULTIVITAMIN WITH MINERALS) TABS tablet Take 1 tablet by mouth daily. 03/13/21   Eugenie Filler, MD  nutrition supplement, JUVEN,  Fanny Dance) PACK Take 1 packet by mouth 2 (two) times daily between meals. 03/13/21   Eugenie Filler, MD  polyethylene glycol (MIRALAX / GLYCOLAX) 17 g packet Take 17 g by mouth 2 (two) times daily. 03/12/21   Eugenie Filler, MD  senna-docusate (SENOKOT-S) 8.6-50 MG tablet Take 1 tablet by mouth 2 (two) times daily. 03/12/21   Eugenie Filler, MD  simvastatin (ZOCOR) 20 MG tablet TAKE ONE TABLET BY MOUTH EVERY NIGHT AT BEDTIME Patient taking differently: Take 20 mg by mouth at bedtime. 10/05/20   Nafziger, Tommi Rumps, NP  traZODone (DESYREL) 50 MG tablet Take 1 tablet (50 mg total) by mouth at bedtime. 03/12/21   Eugenie Filler, MD  zinc sulfate 220 (50 Zn) MG capsule Take 1 capsule (220 mg total) by mouth daily. 03/13/21   Eugenie Filler, MD      Allergies    Patient has no known allergies.    Review of Systems   Review of Systems  Unable to perform ROS: Mental status change   Physical Exam Updated Vital Signs BP (!) 97/57    Pulse 93    Resp 17    Ht 6\' 1"  (1.854 m)    Wt 81.4 kg    SpO2 100%    BMI 23.68 kg/m  Physical Exam Vitals and nursing note reviewed.  Constitutional:      General:  He is in acute distress.     Appearance: He is well-developed. He is ill-appearing and toxic-appearing. He is not diaphoretic.  HENT:     Head: Normocephalic and atraumatic.     Right Ear: External ear normal.     Left Ear: External ear normal.     Nose: No congestion.  Eyes:     Conjunctiva/sclera: Conjunctivae normal.     Pupils: Pupils are equal, round, and reactive to light.  Neck:     Trachea: Phonation normal.  Cardiovascular:     Rate and Rhythm: Normal rate and regular rhythm.     Heart sounds: Normal heart sounds.  Pulmonary:     Effort: Pulmonary effort is normal. No respiratory distress.     Breath sounds: Normal breath sounds. No stridor. No rhonchi.  Abdominal:     Palpations: Abdomen is soft.     Tenderness: There is no abdominal tenderness.  Musculoskeletal:         General: Normal range of motion.     Cervical back: Normal range of motion and neck supple. No tenderness.  Skin:    General: Skin is warm and dry.     Comments: Large foul-smelling sacral decubitus with necrotic center and erythematous surrounding.  No purulent drainage, or bleeding.  No other lesions on the posterior aspect of the body.  Neurological:     Mental Status: He is alert. He is disoriented.     GCS: GCS eye subscore is 4. GCS verbal subscore is 1. GCS motor subscore is 5.     Cranial Nerves: No cranial nerve deficit.     Motor: No abnormal muscle tone.     Comments: Nonverbal.  No gross asymmetry in tone.  Mild spasticity of upper extremities.  Psychiatric:        Behavior: Behavior normal.        Thought Content: Thought content normal.        Judgment: Judgment normal.    ED Results / Procedures / Treatments   Labs (all labs ordered are listed, but only abnormal results are displayed) Labs Reviewed  RESP PANEL BY RT-PCR (FLU A&B, COVID) ARPGX2 - Abnormal; Notable for the following components:      Result Value   SARS Coronavirus 2 by RT PCR POSITIVE (*)    All other components within normal limits  LACTIC ACID, PLASMA - Abnormal; Notable for the following components:   Lactic Acid, Venous 3.5 (*)    All other components within normal limits  COMPREHENSIVE METABOLIC PANEL - Abnormal; Notable for the following components:   Sodium 150 (*)    Chloride 114 (*)    Glucose, Bld 159 (*)    BUN 102 (*)    Creatinine, Ser 2.42 (*)    Albumin 2.6 (*)    GFR, Estimated 26 (*)    All other components within normal limits  CBC WITH DIFFERENTIAL/PLATELET - Abnormal; Notable for the following components:   WBC 26.3 (*)    Neutro Abs 24.8 (*)    Lymphs Abs 0.5 (*)    Abs Immature Granulocytes 0.22 (*)    All other components within normal limits  PROTIME-INR - Abnormal; Notable for the following components:   Prothrombin Time 15.7 (*)    INR 1.3 (*)    All other  components within normal limits  CULTURE, BLOOD (ROUTINE X 2)  CULTURE, BLOOD (ROUTINE X 2)  URINE CULTURE  APTT  URINALYSIS, ROUTINE W REFLEX MICROSCOPIC  LACTIC ACID, PLASMA  EKG EKG Interpretation  Date/Time:  Wednesday March 31 2021 08:30:05 EST Ventricular Rate:  103 PR Interval:    QRS Duration: 134 QT Interval:  383 QTC Calculation: 502 R Axis:   54 Text Interpretation: indeterminate rhythm Paired ventricular premature complexes Right bundle branch block Minimal ST elevation, inferior leads Artifact in lead(s) I II III aVR aVL aVF V1 V2 V6 Tracing is not adequate Serial tracing suggested Confirmed by Daleen Bo [phone]) on 03/15/2021 11:17:15 AM  Radiology DG Chest Port 1 View  Result Date: 03/19/2021 CLINICAL DATA:  Sepsis EXAM: PORTABLE CHEST 1 VIEW COMPARISON:  Chest x-ray 02/28/2021 FINDINGS: Heart size and mediastinal contours are within normal limits. No suspicious pulmonary opacities identified. No pleural effusion or pneumothorax visualized. No acute osseous abnormality appreciated. IMPRESSION: No acute intrathoracic process identified. Electronically Signed   By: Ofilia Neas M.D.   On: 03/19/2021 09:03    Procedures .Critical Care Performed by: Daleen Bo, MD Authorized by: Daleen Bo, MD   Critical care provider statement:    Critical care time (minutes):  50   Critical care start time:  03/15/2021 8:05 AM   Critical care end time:  04/10/2021 11:37 AM   Critical care time was exclusive of:  Separately billable procedures and treating other patients   Critical care was time spent personally by me on the following activities:  Blood draw for specimens, development of treatment plan with patient or surrogate, discussions with consultants, evaluation of patient's response to treatment, examination of patient, ordering and performing treatments and interventions, ordering and review of laboratory studies, ordering and review of radiographic studies,  pulse oximetry, re-evaluation of patient's condition and review of old charts    Medications Ordered in ED Medications  lactated ringers infusion (has no administration in time range)  cefTRIAXone (ROCEPHIN) 2 g in sodium chloride 0.9 % 100 mL IVPB (2 g Intravenous New Bag/Given 04/09/2021 1134)  lactated ringers bolus 1,000 mL (has no administration in time range)    And  lactated ringers bolus 1,000 mL (1,000 mLs Intravenous New Bag/Given 03/17/2021 1116)    And  lactated ringers bolus 500 mL (has no administration in time range)  vancomycin (VANCOREADY) IVPB 1500 mg/300 mL (has no administration in time range)  lactated ringers bolus 1,000 mL (0 mLs Intravenous Stopped 04/10/2021 1017)    ED Course/ Medical Decision Making/ A&P Clinical Course as of 04/10/2021 1137  Wed Mar 31, 2021  1127 Blood pressure improved, 98/59 at this time [EW]    Clinical Course User Index [EW] Daleen Bo, MD                           Medical Decision Making Amount and/or Complexity of Data Reviewed Labs: ordered. Radiology: ordered. ECG/medicine tests: ordered.  Risk Prescription drug management.    Patient Vitals for the past 24 hrs:  BP Pulse Resp SpO2 Height Weight  04/02/2021 1045 -- 93 17 100 % -- --  03/30/2021 1030 (!) 97/57 95 17 100 % -- --  03/14/2021 1015 (!) 105/58 94 16 100 % -- --  03/25/2021 1000 (!) 95/54 93 17 100 % -- --  03/18/2021 0945 98/77 95 16 100 % -- --  03/17/2021 0935 -- -- -- -- 6\' 1"  (1.854 m) 81.4 kg  04/02/2021 0930 100/83 98 15 100 % -- --  04/10/2021 0915 105/63 96 18 100 % -- --  03/15/2021 0900 (!) 97/55 (!) 101 18 99 % -- --  03/21/2021 0845 97/65 -- 20 -- -- --  04/06/2021 0830 (!) 86/43 (!) 105 20 (!) 87 % -- --  03/15/2021 0815 117/89 (!) 106 20 97 % -- --  03/16/2021 0800 -- -- 13 -- -- --  04/13/2021 0745 (!) 102/57 -- -- -- -- --     Medical Decision Making: Summary of Illness/Injury: Patient presenting from his nursing care facility with signs and symptoms of respiratory  infection.  He is unable to give any history.  He was found to have a large decubitus on his posterior thorax/sacral area.  Critical Interventions-clinical evaluation, after testing, IV fluids, radiography, screening for sepsis, observation and reassessment; to evaluate  Chief Complaint  Patient presents with   Shortness of Breath    and assess for illness characterized as Acute, Previously Undiagnosed, Uncertain Prognosis, Complicated, Systemic Symptoms, and Threat to Life/Bodily Function   The Differential Diagnoses include sepsis, wound infection, pneumonia, viral illness, metabolic disorder, complications of end-stage dementia.  I obtained  Additional Historical Information from [place] wife by telephone , as the patient is a poor historian.  She states that the last time she saw her husband, at the rehab, he was "confused."  She is unable to specify exactly what was wrong or the timing.  She relates that he was put in "rehab," to improve his condition.  I decided to review pertinent External Data, and in summary data from recent hospitalization and admission to SNF, on 03/14/2021.  Diagnosed with COVID infection during that hospitalization.  At time of discharge from the hospital on 03/14/2021, he was noted to have a pressure sore, stage II, "right anus," at that time it was described as "partial thickness loss of dermis presenting as shallow open injury with a red, pink wound bed without slough."   Clinical Laboratory Tests Ordered, included CBC, Metabolic panel, Urinalysis, and sepsis bundle . Review indicates abnormal findings include sodium high, chloride high, glucose high, BUN high, creatinine high, albumin low, GFR low, white count high, lactate high. Emergent testing abnormality management required for stabilization-metabolic disorder requiring intravenous fluid replacement, elevated white count requiring treatment with parenteral antibiotics.  Low albumin indicating chronic  malnutrition will require nutritional supplementation.  Radiologic Tests Ordered, included chest x-ray.  I independently Visualized: Radiographic images, which show no infiltrate or pneumonia  Cardiac Monitor Tracing which shows sinus rhythm, rate 92 at 10:30 AM, indicating normal cardiac rhythm   This patient is Presenting for Evaluation of coughing confusion, which does require a range of treatment options, and is a complaint that involves a moderate risk of morbidity and mortality.  Pharmaceutical Risk Management intravenous antibiotics for suspected soft tissue infection of the sacral area Parenteral Treatment with vancomycin and Rocephin    Treatment Complication Risk Evaluation indicates appropriate disposition isHospitalization  After These Interventions, the Patient was reevaluated and was found he requires hospitalization for management of infected sacral decubitus, with general malaise, deconditioned state and poor nourishment/low protein.  This is a complicating factor placing his wound healing at high risk.  He may require surgical debridement.  Overall clinical picture, it is more consistent with dehydration than sepsis, with wound infection possibly driving overall illness.  Doubt severe sepsis.  He requires stabilization in the hospital with close assessment and treatment.  He is full code.  He does not currently require advanced resuscitation or interventions including airway.  Positive COVID status is subacute, likely from infection about a month ago.  Does not appear to need antiviral medication at this  time.  Surgical RiskMajor With Identified Risk chronically ill with multiple comorbidities  Surgery consult, case discussed and arrangements for evaluation in the ED.  They requested a pelvic CT.  This has to be done without contrast due to his AKI.  11:32 AM-Consult complete with hospitalist. Patient case explained and discussed. Requirement for hospitalization agreed on.  Possible Risk of decompensation.  He agrees to admit patient for further evaluation and treatment. Call ended at 11:50 AM  CRITICAL CARE-yes Performed by: Daleen Bo              Final Clinical Impression(s) / ED Diagnoses Final diagnoses:  Wound infection  Pressure injury of sacral region, unstageable ([place])  Other cough  AKI (acute kidney injury) ([place])  Hypernatremia    Rx / DC Orders ED Discharge Orders     None         Daleen Bo, MD 04/03/2021 1228

## 2021-03-31 NOTE — H&P (Signed)
History and Physical    Erik Lara JSH:[id number] DOB: 07-Aug-1935 DOA: 03/19/2021  PCP: Dorothyann Peng, NP  Patient coming from: [facility]  Chief Complaint: Altered mental status, shortness of breath  HPI: Erik Lara is a 86 y.o. male with medical history significant of HLD, hypothyroidism, Lewy Body dementia, DM2. Presenting to the ED w/ AMS and shortness of breath. History is from son. The patient has been at [facility] recovering from a [place] admission to the hospital. He has been a little less communicative the last couple of times he was visited by his wife. The wife received a phone call this morning stating that the patient was being sent to the hospital because he appeared short of breath.   ED Course: He was noted to have a necrotic sacral ulcer. CXR was negative. He was started on vancomycin. [facility] was called for admission.  Review of Systems:  Unable to obtain d/t mentation  PMHx Past Medical History:  Diagnosis Date   Gallstones    asymptomatic   Hearing loss of right ear 01/23/2017   Hepatic Cysts    Hyperlipidemia    Hyperthyroidism 01/28/2013   Mild neurocognitive disorder 09/13/2016   Prostatitis    requiring a trip to the ER in [place] with a fever - 09   Type 2 diabetes mellitus without complication, without long-term current use of insulin ([place]) 03/30/2015    PSHx Past Surgical History:  Procedure Laterality Date   HERNIA REPAIR     Childhood    SocHx Unable to obtain d/t mentation  No Known Allergies  FamHx Family History  Problem Relation Age of Onset   Cancer Other        breast   Diabetes Other     Prior to Admission medications   Medication Sig Start Date End Date Taking? Authorizing Provider  ascorbic acid (VITAMIN C) 500 MG tablet Take 1 tablet (500 mg total) by mouth daily. 03/13/21  Yes Eugenie Filler, MD  cetirizine (ZYRTEC) 10 MG tablet Take 10 mg by mouth daily.   Yes [provider]  cholecalciferol  (VITAMIN D) 25 MCG tablet Take 1 tablet (1,000 Units total) by mouth daily. 03/13/21  Yes Eugenie Filler, MD  donepezil (ARICEPT) 10 MG tablet Take 1 tablet (10 mg total) by mouth at bedtime. 10/13/20  Yes Cameron Sprang, MD  melatonin 3 MG TABS tablet Take 1 tablet (3 mg total) by mouth at bedtime. 03/12/21  Yes Eugenie Filler, MD  Multiple Vitamin (MULTIVITAMIN WITH MINERALS) TABS tablet Take 1 tablet by mouth daily. 03/13/21  Yes Eugenie Filler, MD  rosuvastatin (CRESTOR) 5 MG tablet Take 5 mg by mouth at bedtime.   Yes [provider]  traZODone (DESYREL) 50 MG tablet Take 1 tablet (50 mg total) by mouth at bedtime. 03/12/21  Yes Eugenie Filler, MD  zinc sulfate 220 (50 Zn) MG capsule Take 1 capsule (220 mg total) by mouth daily. 03/13/21  Yes Eugenie Filler, MD  acetaminophen (TYLENOL) 325 MG tablet Take 2 tablets (650 mg total) by mouth every 6 (six) hours as needed for mild pain (or Fever >/= 101). 03/12/21   Eugenie Filler, MD  feeding supplement (ENSURE ENLIVE / ENSURE PLUS) LIQD Take 237 mLs by mouth 3 (three) times daily between meals. 03/12/21   Eugenie Filler, MD  memantine (NAMENDA) 10 MG tablet Take 1 tablet twice a day Patient taking differently: Take 10 mg by mouth 2 (two)  times daily. 12/11/20   Cameron Sprang, MD  nutrition supplement, JUVEN, Fanny Dance) PACK Take 1 packet by mouth 2 (two) times daily between meals. 03/13/21   Eugenie Filler, MD  polyethylene glycol (MIRALAX / GLYCOLAX) 17 g packet Take 17 g by mouth 2 (two) times daily. 03/12/21   Eugenie Filler, MD  senna-docusate (SENOKOT-S) 8.6-50 MG tablet Take 1 tablet by mouth 2 (two) times daily. 03/12/21   Eugenie Filler, MD  simvastatin (ZOCOR) 20 MG tablet TAKE ONE TABLET BY MOUTH EVERY NIGHT AT BEDTIME Patient not taking: Reported on 03/27/2021 10/05/20   Dorothyann Peng, NP    Physical Exam: Vitals:   03/25/2021 1115 04/09/2021 1130 04/09/2021 1142 03/28/2021 1215  BP: (!) 98/59  112/62  (!) 106/38  Pulse: 92 92 90 92  Resp: 18 16 17 18   SpO2: 100% 100% 100% 100%  Weight:      Height:        General: 86 y.o. ill appearing male resting in bed Eyes: PERRL, normal sclera ENMT: Nares patent w/o discharge, orophaynx clear, dentition normal, ears w/o discharge/lesions/ulcers Neck: Supple, trachea midline Cardiovascular: tachy, +S1, S2, no m/g/r, equal pulses throughout Respiratory: decreased at bases, no w/r/r, normal WOB on 2L [place] GI: BS+, NDNT, no masses noted, no organomegaly noted MSK: No e/c/c; sacral decub ulcer w/ necrosis Neuro: stirs to noxious stimuli, but no participating in exam  Labs on Admission: I have personally reviewed following labs and imaging studies  CBC: Recent Labs  Lab 03/16/2021 0840  WBC 26.3*  NEUTROABS 24.8*  HGB 13.6  HCT 44.5  MCV 96.9  PLT 301   Basic Metabolic Panel: Recent Labs  Lab 03/27/2021 0840  NA 150*  K 4.5  CL 114*  CO2 24  GLUCOSE 159*  BUN 102*  CREATININE 2.42*  CALCIUM 9.8   GFR: Estimated Creatinine Clearance: 25.7 mL/min (A) (by C-G formula based on SCr of 2.42 mg/dL (H)). Liver Function Tests: Recent Labs  Lab 03/29/2021 0840  AST 23  ALT 39  ALKPHOS 89  BILITOT 1.1  PROT 6.8  ALBUMIN 2.6*   No results for input(s): LIPASE, AMYLASE in the last 168 hours. No results for input(s): AMMONIA in the last 168 hours. Coagulation Profile: Recent Labs  Lab 03/30/2021 0840  INR 1.3*   Cardiac Enzymes: No results for input(s): CKTOTAL, CKMB, CKMBINDEX, TROPONINI in the last 168 hours. BNP (last 3 results) No results for input(s): PROBNP in the last 8760 hours. HbA1C: No results for input(s): HGBA1C in the last 72 hours. CBG: No results for input(s): GLUCAP in the last 168 hours. Lipid Profile: No results for input(s): CHOL, HDL, LDLCALC, TRIG, CHOLHDL, LDLDIRECT in the last 72 hours. Thyroid Function Tests: No results for input(s): TSH, T4TOTAL, FREET4, T3FREE, THYROIDAB in the last 72  hours. Anemia Panel: No results for input(s): VITAMINB12, FOLATE, FERRITIN, TIBC, IRON, RETICCTPCT in the last 72 hours. Urine analysis:    Component Value Date/Time   COLORURINE YELLOW 03/26/2021 [place] 03/17/2021 0847   LABSPEC 1.015 04/10/2021 0847   PHURINE 5.0 03/29/2021 0847   GLUCOSEU NEGATIVE 03/17/2021 0847   HGBUR NEGATIVE 03/26/2021 0847   HGBUR negative 03/29/2010 0812   BILIRUBINUR NEGATIVE 03/19/2021 0847   BILIRUBINUR n 03/28/2016 [place] 03/15/2021 0847   PROTEINUR NEGATIVE 03/21/2021 0847   UROBILINOGEN 0.2 03/28/2016 1137   UROBILINOGEN 0.2 03/29/2010 0812   NITRITE NEGATIVE 03/20/2021 0847   LEUKOCYTESUR NEGATIVE 03/30/2021 0847  Radiological Exams on Admission: DG Chest Port 1 View  Result Date: 03/30/2021 CLINICAL DATA:  Sepsis EXAM: PORTABLE CHEST 1 VIEW COMPARISON:  Chest x-ray 02/28/2021 FINDINGS: Heart size and mediastinal contours are within normal limits. No suspicious pulmonary opacities identified. No pleural effusion or pneumothorax visualized. No acute osseous abnormality appreciated. IMPRESSION: No acute intrathoracic process identified. Electronically Signed   By: Ofilia Neas M.D.   On: 03/19/2021 09:03    Assessment/Plan Sepsis likely secondary to large sacral decubitus ulcer     - admit to inpt, progressive     - hypotensive at presentation, tachycardic at admission, WBC is 26.3, lactic acid was 3.5 at presentation, he has AMS and AKI     - necrotic and foul smelling sacral ulcer noted as being POA     - started on vanc; will broaden coverage to include cefepime and flagyl; pharm to dose, appreciate assistance     - continue fluids     - follow blood cultures     - general surgery consulted; appreciate assistance     - CT chest and pelvis pending  COVID 19 positive     - he was Dx'd in December with COVID and treated w/ remdes     - he is currently on 2L [place], but sats are 100%     - wean O2 as  able     - cycle threshold is 38.5; no antiviral for now; will have PRN nebs available  Acute metabolic encephalopathy     - see Sepsis above  AKI Hypernatremia (mild) Acute urinary retention     - check renal US and continue fluids     - watch nephrotoxins     - In the ED a bladder scan was obtained that showed 1200cc; foley was placed; continue it for now  Hx of lewy body dementia     - continue home regimen when he is more alert and able to take PO  DM2; diet controlled     - recent A1c 02/27/21 was 5.7     - glucose is mildly elevated right now     - will follow daily labs for now  HLD     - resume home regimen when he is more alert and able to take PO  DVT prophylaxis: heparin  Code Status: FULL  Family Communication: Spoke w/ son (primary contact) by phone Consults called: General surgery   Status is: Inpatient  Remains inpatient appropriate because: severity of illness  Jonnie Finner DO [facility]  If 7PM-7AM, please contact night-coverage [email or web address]  03/29/2021, 12:52 PM

## 2021-04-01 ENCOUNTER — Inpatient Hospital Stay (HOSPITAL_COMMUNITY): Payer: PPO

## 2021-04-01 DIAGNOSIS — E119 Type 2 diabetes mellitus without complications: Secondary | ICD-10-CM

## 2021-04-01 DIAGNOSIS — R7989 Other specified abnormal findings of blood chemistry: Secondary | ICD-10-CM

## 2021-04-01 DIAGNOSIS — G3183 Dementia with Lewy bodies: Secondary | ICD-10-CM

## 2021-04-01 DIAGNOSIS — F0280 Dementia in other diseases classified elsewhere without behavioral disturbance: Secondary | ICD-10-CM

## 2021-04-01 DIAGNOSIS — G9341 Metabolic encephalopathy: Secondary | ICD-10-CM

## 2021-04-01 DIAGNOSIS — R338 Other retention of urine: Secondary | ICD-10-CM | POA: Diagnosis not present

## 2021-04-01 DIAGNOSIS — E870 Hyperosmolality and hypernatremia: Secondary | ICD-10-CM

## 2021-04-01 DIAGNOSIS — A419 Sepsis, unspecified organism: Secondary | ICD-10-CM | POA: Diagnosis not present

## 2021-04-01 DIAGNOSIS — N1330 Unspecified hydronephrosis: Secondary | ICD-10-CM

## 2021-04-01 DIAGNOSIS — L89150 Pressure ulcer of sacral region, unstageable: Secondary | ICD-10-CM

## 2021-04-01 DIAGNOSIS — N179 Acute kidney failure, unspecified: Secondary | ICD-10-CM

## 2021-04-01 LAB — GLUCOSE, CAPILLARY
Glucose-Capillary: 101 mg/dL — ABNORMAL HIGH (ref 70–99)
Glucose-Capillary: 115 mg/dL — ABNORMAL HIGH (ref 70–99)
Glucose-Capillary: 91 mg/dL (ref 70–99)

## 2021-04-01 LAB — LACTIC ACID, PLASMA: Lactic Acid, Venous: 2.3 mmol/L (ref 0.5–1.9)

## 2021-04-01 LAB — COMPREHENSIVE METABOLIC PANEL
ALT: 31 U/L (ref 0–44)
AST: 25 U/L (ref 15–41)
Albumin: 2 g/dL — ABNORMAL LOW (ref 3.5–5.0)
Alkaline Phosphatase: 78 U/L (ref 38–126)
Anion gap: 12 (ref 5–15)
BUN: 71 mg/dL — ABNORMAL HIGH (ref 8–23)
CO2: 23 mmol/L (ref 22–32)
Calcium: 9.2 mg/dL (ref 8.9–10.3)
Chloride: 116 mmol/L — ABNORMAL HIGH (ref 98–111)
Creatinine, Ser: 1.35 mg/dL — ABNORMAL HIGH (ref 0.61–1.24)
GFR, Estimated: 52 mL/min — ABNORMAL LOW (ref >60)
Glucose, Bld: 107 mg/dL — ABNORMAL HIGH (ref 70–99)
Potassium: 3.8 mmol/L (ref 3.5–5.1)
Sodium: 151 mmol/L — ABNORMAL HIGH (ref 135–145)
Total Bilirubin: 1 mg/dL (ref 0.3–1.2)
Total Protein: 5.4 g/dL — ABNORMAL LOW (ref 6.5–8.1)

## 2021-04-01 LAB — PROCALCITONIN: Procalcitonin: 0.31 ng/mL

## 2021-04-01 LAB — CORTISOL-AM, BLOOD: Cortisol - AM: 34.7 ug/dL — ABNORMAL HIGH (ref 6.7–22.6)

## 2021-04-01 LAB — URINE CULTURE: Culture: NO GROWTH

## 2021-04-01 LAB — PROTIME-INR
INR: 1.4 — ABNORMAL HIGH (ref 0.8–1.2)
Prothrombin Time: 17.4 seconds — ABNORMAL HIGH (ref 11.4–15.2)

## 2021-04-01 LAB — MRSA NEXT GEN BY PCR, NASAL: MRSA by PCR Next Gen: NOT DETECTED

## 2021-04-01 MED ORDER — SODIUM CHLORIDE 0.9 % IV SOLN
2.0000 g | Freq: Two times a day (BID) | INTRAVENOUS | Status: DC
  Administered 2021-04-01 – 2021-04-02 (×4): 2 g via INTRAVENOUS
  Filled 2021-04-01 (×5): qty 2

## 2021-04-01 MED ORDER — COLLAGENASE 250 UNIT/GM EX OINT
TOPICAL_OINTMENT | Freq: Every day | CUTANEOUS | Status: DC
  Filled 2021-04-01 (×2): qty 30

## 2021-04-01 MED ORDER — DEXTROSE 5 % IV SOLN: INTRAVENOUS | Status: DC

## 2021-04-01 MED ORDER — VANCOMYCIN HCL 1000 MG/200ML IV SOLN
1000.0000 mg | Freq: Every day | INTRAVENOUS | Status: DC
  Administered 2021-04-01 – 2021-04-02 (×2): 1000 mg via INTRAVENOUS
  Filled 2021-04-01 (×2): qty 200

## 2021-04-01 NOTE — Assessment & Plan Note (Addendum)
Hold home Crestor

## 2021-04-01 NOTE — Assessment & Plan Note (Addendum)
Creatinine of 2.42 on admission. Secondary to acute urinary retention in addition to decreased oral intake. Foley catheter placed and IV fluids given with improved creatinine.

## 2021-04-01 NOTE — Progress Notes (Addendum)
Pharmacy Antibiotic Note  Erik Lara is a 86 y.o. male with sacrum wound presented the ED on 04/03/2021 with c/o SOB. Pharmacy has been consulted to dose vancomycin, cefepime, and flagyl for wound infection.  Today, 04/01/2021: D2 abx Remains afebrile WBC and LA remain elevated/stable SCr improved significantly  Plan: Increase vancomycin to 1000 mg IV q24 (AUC 497 w/ SCr 1.35, Vd 0.72) with improved renal function SCr daily while on vanc Increase cefepime to 2g IV q12 hr Continue Flagyl 500 mg IV q12 hr  _____________________________________  Height: 6\' 1"  (185.4 cm) Weight: 71.9 kg (158 lb 9.6 oz) IBW/kg (Calculated) : 79.9  Temp (24hrs), Avg:98.5 F (36.9 C), Min:97.7 F (36.5 C), Max:99 F (37.2 C)  Recent Labs  Lab 04/09/2021 0840 04/01/2021 0849 03/16/2021 1146 03/14/2021 2216 04/01/21 0333  WBC 26.3*  --   --  27.4*  --   CREATININE 2.42*  --   --  1.54* 1.35*  LATICACIDVEN  --  3.5* 2.5* 2.6* 2.3*     Estimated Creatinine Clearance: 41.4 mL/min (A) (by C-G formula based on SCr of 1.35 mg/dL (H)).    No Known Allergies  Antimicrobials this admission: 1/18 CTX x1 1/18 vanc >> 1/18 cefepime >> 1/18 flagyl >>  Dose adjustments this admission: 1/19 chg vanc 1500 q48 >> 1g q24; cefepime 2q24 >> 2q12 w/ improved SCr  Microbiology results: 1/18 COVID+ 1/18 UCx: NGF 1/18 BCx: ngtd  Thank you for allowing pharmacy to be a part of this patients care.  Shahira Fiske A 04/01/2021 2:12 PM

## 2021-04-01 NOTE — Assessment & Plan Note (Addendum)
Concern that this is a source for infection/sepsis. General surgery consulted. CT pelvis significant for no abscess formation. Started empirically on Vancomycin, Cefepime and Flagyl. Patient developed worsening leukocytosis and continued fevers with subsequent change to meropenem IV. Now transitioned to comfort measures.

## 2021-04-01 NOTE — Progress Notes (Incomplete)
PROGRESS NOTE    Erik Lara  PTW:[id number] DOB: 06-26-1935 DOA: 03/19/2021 PCP: Dorothyann Peng, NP   Brief Narrative: No notes on file   Assessment & Plan:   No notes have been filed under this hospital service. Service: Hospitalist     ----- Pressure Injury 03/04/21 Anus Right;Left Deep Tissue Pressure Injury - Purple or maroon localized area of discolored intact skin or blood-filled blister due to damage of underlying soft tissue from pressure and/or shear. non-blanchable redness with small (Active)  03/04/21 2000  Location: Anus  Location Orientation: Right;Left  Staging: Deep Tissue Pressure Injury - Purple or maroon localized area of discolored intact skin or blood-filled blister due to damage of underlying soft tissue from pressure and/or shear.  Wound Description (Comments): non-blanchable redness with small skin tears  Present on Admission: No     Pressure Injury 04/08/2021 Ankle Anterior;Right (Active)  04/08/2021 2200  Location: Ankle  Location Orientation: Anterior;Right  Staging:   Wound Description (Comments):   Present on Admission:      Pressure Injury 04/09/2021 Pretibial Distal;Left Stage 2 -  Partial thickness loss of dermis presenting as a shallow open injury with a red, pink wound bed without slough. pressure injury from crossing legs/ankles (Active)  03/14/2021 2240  Location: Pretibial  Location Orientation: Distal;Left  Staging: Stage 2 -  Partial thickness loss of dermis presenting as a shallow open injury with a red, pink wound bed without slough.  Wound Description (Comments): pressure injury from crossing legs/ankles  Present on Admission: Yes     Pressure Injury 03/15/2021 Heel Left Unstageable - Full thickness tissue loss in which the base of the injury is covered by slough (yellow, tan, gray, green or brown) and/or eschar (tan, brown or black) in the wound bed. eschar imbedded in the tissue (Active)  03/27/2021 2227  Location: Heel  Location  Orientation: Left  Staging: Unstageable - Full thickness tissue loss in which the base of the injury is covered by slough (yellow, tan, gray, green or brown) and/or eschar (tan, brown or black) in the wound bed.  Wound Description (Comments): eschar imbedded in the tissue  Present on Admission: Yes     Pressure Injury 03/17/2021 Heel Right Unstageable - Full thickness tissue loss in which the base of the injury is covered by slough (yellow, tan, gray, green or brown) and/or eschar (tan, brown or black) in the wound bed. eschar imbedded in the tissue (Active)  04/02/2021 2226  Location: Heel  Location Orientation: Right  Staging: Unstageable - Full thickness tissue loss in which the base of the injury is covered by slough (yellow, tan, gray, green or brown) and/or eschar (tan, brown or black) in the wound bed.  Wound Description (Comments): eschar imbedded in the tissue  Present on Admission: Yes     Pressure Injury 03/25/2021 Sacrum Medial Unstageable - Full thickness tissue loss in which the base of the injury is covered by slough (yellow, tan, gray, green or brown) and/or eschar (tan, brown or black) in the wound bed. large sacral cavity -multi-cole (Active)  03/21/2021 2242  Location: Sacrum  Location Orientation: Medial  Staging: Unstageable - Full thickness tissue loss in which the base of the injury is covered by slough (yellow, tan, gray, green or brown) and/or eschar (tan, brown or black) in the wound bed.  Wound Description (Comments): large sacral cavity -multi-colered slough, purulent drainage with significant odor.  Present on Admission: Yes     Pressure Injury 03/26/2021 Ankle Anterior;Left Unstageable -  Full thickness tissue loss in which the base of the injury is covered by slough (yellow, tan, gray, green or brown) and/or eschar (tan, brown or black) in the wound bed. Eschar at malleolus of th (Active)  03/16/2021 2240  Location: Ankle  Location Orientation: Anterior;Left  Staging:  Unstageable - Full thickness tissue loss in which the base of the injury is covered by slough (yellow, tan, gray, green or brown) and/or eschar (tan, brown or black) in the wound bed.  Wound Description (Comments): Eschar at malleolus of the ankle  Present on Admission: Yes     Pressure Injury 03/18/2021 Knee Anterior;Left Stage 2 -  Partial thickness loss of dermis presenting as a shallow open injury with a red, pink wound bed without slough. quarter-sized red and yellow oval area at patella- appears abraded and poorly healing (Active)  04/05/2021 2241  Location: Knee  Location Orientation: Anterior;Left  Staging: Stage 2 -  Partial thickness loss of dermis presenting as a shallow open injury with a red, pink wound bed without slough.  Wound Description (Comments): quarter-sized red and yellow oval area at patella- appears abraded and poorly healing  Present on Admission: Yes     Pressure Injury 04/10/2021 Elbow Posterior;Right Deep Tissue Pressure Injury - Purple or maroon localized area of discolored intact skin or blood-filled blister due to damage of underlying soft tissue from pressure and/or shear. red and purple are of soft  (Active)  04/02/2021 2237  Location: Elbow  Location Orientation: Posterior;Right  Staging: Deep Tissue Pressure Injury - Purple or maroon localized area of discolored intact skin or blood-filled blister due to damage of underlying soft tissue from pressure and/or shear.  Wound Description (Comments): red and purple are of soft tissue of posterior elbow  Present on Admission: Yes     DVT prophylaxis: *** Code Status:   Code Status: Full Code Family Communication: *** Disposition Plan: ***   Consultants:  ***  Procedures:  ***  Antimicrobials: ***    Subjective: ***  Objective: Vitals:   04/06/2021 2151 03/28/2021 2217 04/01/21 0130 04/01/21 0522  BP: (!) 103/54  106/67 (!) 102/55  Pulse: 93  91 92  Resp: 18  16 16   Temp: 98.3 F (36.8 C)  97.7 F (36.5  C) 98.8 F (37.1 C)  TempSrc: Oral  Oral Oral  SpO2: 96% 96% 96% 98%  Weight:  71.9 kg    Height:  6\' 1"  (1.854 m)      Intake/Output Summary (Last 24 hours) at 04/01/2021 0911 Last data filed at 04/01/2021 0138 Gross per 24 hour  Intake 5917.9 ml  Output 1600 ml  Net 4317.9 ml   Filed Weights   03/26/2021 0935 04/02/2021 2217  Weight: 81.4 kg 71.9 kg    Examination:  General exam: Appears calm and comfortable *** Respiratory system: Clear to auscultation. Respiratory effort normal. Cardiovascular system: S1 & S2 heard, RRR. No murmurs, rubs, gallops or clicks. Gastrointestinal system: Abdomen is nondistended, soft and nontender. No organomegaly or masses felt. Normal bowel sounds heard. Central nervous system: Alert and oriented. No focal neurological deficits. Musculoskeletal: No edema. No calf tenderness Skin: No cyanosis. No rashes Psychiatry: Judgement and insight appear normal. Mood & affect appropriate.     Data Reviewed: I have personally reviewed following labs and imaging studies  CBC Lab Results  Component Value Date   WBC 27.4 (H) 03/23/2021   RBC 3.82 (L) 03/19/2021   HGB 11.4 (L) 04/10/2021   HCT 36.4 (L) 04/13/2021   MCV  95.3 03/29/2021   MCH 29.8 03/17/2021   PLT 229 03/21/2021   MCHC 31.3 03/22/2021   RDW 14.1 04/01/2021   LYMPHSABS 0.5 (L) 03/29/2021   MONOABS 0.7 04/03/2021   EOSABS 0.0 04/07/2021   BASOSABS 0.1 28/31/5176     Last metabolic panel Lab Results  Component Value Date   NA 151 (H) 04/01/2021   K 3.8 04/01/2021   CL 116 (H) 04/01/2021   CO2 23 04/01/2021   BUN 71 (H) 04/01/2021   CREATININE 1.35 (H) 04/01/2021   GLUCOSE 107 (H) 04/01/2021   GFRNONAA 52 (L) 04/01/2021   GFRAA 123 02/05/2008   CALCIUM 9.2 04/01/2021   PHOS 3.1 03/03/2021   PROT 5.4 (L) 04/01/2021   ALBUMIN 2.0 (L) 04/01/2021   BILITOT 1.0 04/01/2021   ALKPHOS 78 04/01/2021   AST 25 04/01/2021   ALT 31 04/01/2021   ANIONGAP 12 04/01/2021    CBG (last  3)  Recent Labs    04/11/2021 2338 04/01/21 0530  GLUCAP 105* 101*     GFR: Estimated Creatinine Clearance: 41.4 mL/min (A) (by C-G formula based on SCr of 1.35 mg/dL (H)).  Coagulation Profile: Recent Labs  Lab 03/24/2021 0840 04/01/21 0333  INR 1.3* 1.4*    Recent Results (from the past 240 hour(s))  Blood Culture (routine x 2)     Status: None (Preliminary result)   Collection Time: 03/28/2021  8:40 AM   Specimen: BLOOD  Result Value Ref Range Status   Specimen Description   Final    BLOOD RIGHT ARM Performed at [facility] [address] [place], [place] [address]    Special Requests   Final    BOTTLES DRAWN AEROBIC AND ANAEROBIC Blood Culture adequate volume Performed at [facility] [address] [place], [place] [address]    Culture   Final    NO GROWTH < 24 HOURS Performed at [facility] [address], [place], [place] [address]    Report Status PENDING  Incomplete  Urine Culture     Status: None   Collection Time: 03/30/2021  8:47 AM   Specimen: In/Out Cath Urine  Result Value Ref Range Status   Specimen Description   Final    IN/OUT CATH URINE Performed at [facility] [address], [place], [place] [address]    Special Requests   Final    NONE Performed at [facility], [place] [address], [place], [place] [address]    Culture   Final    NO GROWTH Performed at [facility], [place] [address], [place], [place] [address]    Report Status 04/01/2021 FINAL  Final  Resp Panel by RT-PCR (Flu A&B, Covid) Nasopharyngeal Swab     Status: Abnormal   Collection Time: 03/24/2021  8:49 AM   Specimen: Nasopharyngeal Swab; Nasopharyngeal(NP) swabs in vial transport medium  Result Value Ref Range Status   SARS Coronavirus 2 by RT PCR POSITIVE (A) NEGATIVE Final    Comment: (NOTE) SARS-CoV-2 target nucleic acids are DETECTED.  The SARS-CoV-2 RNA is generally detectable in upper  respiratory specimens during the acute phase of infection. Positive results are indicative of the presence of the identified virus, but do not rule out bacterial infection or co-infection with other pathogens not detected by the test. Clinical correlation with patient history and other diagnostic information is necessary to determine patient infection status. The expected result is Negative.  Fact Sheet for Patients: EntrepreneurPulse.com.au  Fact Sheet for Healthcare Providers: IncredibleEmployment.be  This test is not yet approved or cleared by the [place] FDA and  has been authorized for detection and/or diagnosis of SARS-CoV-2 by FDA under an Emergency Use Authorization (EUA).  This EUA will remain in effect (meaning this test can be used) for the duration of  the COVID-19 declaration under Section 564(b)(1) of the A ct, 21 U.S.C. section 360bbb-3(b)(1), unless the authorization is terminated or revoked sooner.     Influenza A by PCR NEGATIVE NEGATIVE Final   Influenza B by PCR NEGATIVE NEGATIVE Final    Comment: (NOTE) The Xpert Xpress SARS-CoV-2/FLU/RSV plus assay is intended as an aid in the diagnosis of influenza from Nasopharyngeal swab specimens and should not be used as a sole basis for treatment. Nasal washings and aspirates are unacceptable for Xpert Xpress SARS-CoV-2/FLU/RSV testing.  Fact Sheet for Patients: EntrepreneurPulse.com.au  Fact Sheet for Healthcare Providers: IncredibleEmployment.be  This test is not yet approved or cleared by the [place] FDA and has been authorized for detection and/or diagnosis of SARS-CoV-2 by FDA under an Emergency Use Authorization (EUA). This EUA will remain in effect (meaning this test can be used) for the duration of the COVID-19 declaration under Section 564(b)(1) of the Act, 21 U.S.C. section 360bbb-3(b)(1), unless the authorization is  terminated or revoked.  Performed at [facility], [place] [address], [place], [place] [address]   Blood Culture (routine x 2)     Status: None (Preliminary result)   Collection Time: 03/29/2021 11:44 AM   Specimen: BLOOD LEFT HAND  Result Value Ref Range Status   Specimen Description   Final    BLOOD LEFT HAND Performed at [facility] [address] [place], [place] [address]    Special Requests   Final    BOTTLES DRAWN AEROBIC AND ANAEROBIC Blood Culture results may not be optimal due to an inadequate volume of blood received in culture bottles Performed at [facility] [address] [place], [place] [address]    Culture   Final    NO GROWTH < 24 HOURS Performed at [facility] [address] [place], [place] [address]    Report Status PENDING  Incomplete        Radiology Studies: CT CHEST WO CONTRAST  Result Date: 04/11/2021 CLINICAL DATA:  Possible sepsis.  Respiratory illness. EXAM: CT CHEST WITHOUT CONTRAST TECHNIQUE: Multidetector CT imaging of the chest was performed following the standard protocol without IV contrast. RADIATION DOSE REDUCTION: This exam was performed according to the departmental dose-optimization program which includes automated exposure control, adjustment of the mA and/or kV according to patient size and/or use of iterative reconstruction technique. COMPARISON:  Abdomen CT 02/22/2007. FINDINGS: Cardiovascular: The heart size is normal. No substantial pericardial effusion. Coronary artery calcification is evident. Mild atherosclerotic calcification is noted in the wall of the thoracic aorta. Mediastinum/Nodes: No mediastinal lymphadenopathy. No evidence for gross hilar lymphadenopathy although assessment is limited by the lack of intravenous contrast on the current study. The esophagus has normal imaging features. 2.6 cm exophytic nodule noted posterior left thyroid gland. There is no axillary  lymphadenopathy. Lungs/Pleura: Patchy airspace disease in the posterior right lower lobe is compatible with pneumonia. There is some dependent atelectasis in both lower lungs. No pulmonary edema or substantial pleural effusion. Upper Abdomen: Innumerable large hepatic cysts are again noted, some with mural calcification. Findings are not substantially progressive when comparing to the remote abdomen CT. Left renal  cysts are evident. Musculoskeletal: No worrisome lytic or sclerotic osseous abnormality. Sclerotic lesion in the T10 vertebral body is new in the interval since 2008, but likely benign. IMPRESSION: 1. Patchy airspace disease in the posterior right lower lobe compatible with pneumonia. Consider follow-up CT chest in 3 months to ensure resolution. 2. 2.6 cm exophytic nodule posterior left thyroid gland. This has been evaluated on previous imaging. (ref: J Am Coll Radiol. 2015 Feb;12(2): 143-50). 3. Hepatic and left renal cysts. 4. Aortic Atherosclerosis (ICD10-I70.0). Electronically Signed   By: Misty Stanley M.D.   On: 03/17/2021 15:07   CT PELVIS WO CONTRAST  Result Date: 04/06/2021 CLINICAL DATA:  Sacral decubitus ulcer. Osteomyelitis suspected. Possible sepsis. EXAM: CT PELVIS WITHOUT CONTRAST TECHNIQUE: Multidetector CT imaging of the pelvis was performed following the standard protocol without intravenous contrast. RADIATION DOSE REDUCTION: This exam was performed according to the departmental dose-optimization program which includes automated exposure control, adjustment of the mA and/or kV according to patient size and/or use of iterative reconstruction technique. COMPARISON:  Limited correlation made with one view abdomen 03/05/2021 and abdominal CT 03/05/2021. FINDINGS: Urinary Tract: The visualized distal ureters appear unremarkable. A Foley catheter is in place with air in the bladder lumen. There is generalized bladder wall thickening. Multiple small dependent calculi are present within the  bladder lumen. Bowel: No bowel wall thickening, distention or surrounding inflammation identified within the pelvis. Mildly prominent rectal stool. Vascular/Lymphatic: No enlarged pelvic lymph nodes identified. Mild iliofemoral atherosclerosis. Reproductive: The prostate gland and seminal vesicles appear unremarkable. Other: No intrapelvic fluid collection or free air identified. Musculoskeletal: There is soft tissue ulceration along the inferior aspect of the intergluteal fold, with soft tissue emphysema extending inferior to the coccyx, asymmetric to the left. There are inflammatory changes within the surrounding subcutaneous fat, although no foreign body is identified. There is no definite bone destruction to suggest osteomyelitis. There are mild degenerative changes of the hips and sacroiliac joints bilaterally. No significant joint effusions. Moderate facet arthropathy noted in the lower lumbar spine. IMPRESSION: 1. Soft tissue decubitus ulceration over the distal sacrum and coccyx with associated soft tissue emphysema suspicious for soft tissue infection. No drainable abscess identified. 2. No evidence of pelvic osteomyelitis. 3. Degenerative changes in the lower lumbar spine, sacroiliac joints and hips without significant joint effusions. 4. Multiple small bladder calculi with nonspecific bladder wall thickening. Foley catheter in place. Electronically Signed   By: Richardean Sale M.D.   On: 04/02/2021 15:13   US RENAL  Result Date: 04/01/2021 CLINICAL DATA:  AKI. EXAM: RENAL / URINARY TRACT ULTRASOUND COMPLETE COMPARISON:  02/22/2007. FINDINGS: Right Kidney: Renal measurements: 13.7 x 5.8 x 7.3 cm = volume: 503.6 mL. Echogenicity within normal limits. No renal calculus. There is mild hydronephrosis. Left Kidney: Renal measurements: 12.5 x 5.9 x 4.9 cm = volume: 188.8 mL. Echogenicity within normal limits. There is a cyst in the midpole measuring 2.3 x 1.8 x 1.8 cm with a thin rim calcification. A cyst is  present in the upper pole measuring 2.1 x 2.2 x 2.3 cm. No renal calculi. Moderate hydronephrosis is noted. Bladder: The bladder is nondistended and a Foley catheter is in place. Other: Limited evaluation due to patient's body habitus and immobility. Incidental note of multiple cysts in the liver. IMPRESSION: 1. Moderate hydronephrosis on the left and mild hydronephrosis on the right. 2. Left renal cysts, 1 with a thin rim calcification. 3. Foley catheter in the urinary bladder. Electronically Signed   By: Brett Fairy  M.D.   On: 04/01/2021 02:11   DG Chest Port 1 View  Result Date: 04/13/2021 CLINICAL DATA:  Sepsis EXAM: PORTABLE CHEST 1 VIEW COMPARISON:  Chest x-ray 02/28/2021 FINDINGS: Heart size and mediastinal contours are within normal limits. No suspicious pulmonary opacities identified. No pleural effusion or pneumothorax visualized. No acute osseous abnormality appreciated. IMPRESSION: No acute intrathoracic process identified. Electronically Signed   By: Ofilia Neas M.D.   On: 03/28/2021 09:03        Scheduled Meds:  Chlorhexidine Gluconate Cloth  6 each Topical Daily   heparin  5,000 Units Subcutaneous Q8H   Continuous Infusions:  ceFEPime (MAXIPIME) IV Stopped (03/27/2021 1604)   metronidazole 500 mg (04/01/21 0157)   [START ON 04/02/2021] vancomycin       LOS: 1 day     Cordelia Poche, MD [facility] 04/01/2021, 9:11 AM  If 7PM-7AM, please contact night-coverage [email or web address]

## 2021-04-01 NOTE — Progress Notes (Signed)
Patient ID: Erik Lara, male   DOB: [birth year], 86 y.o.   MRN: [id number] [facility] Surgery Progress Note     Subjective: CC-  Seen with PT.  Objective: Vital signs in last 24 hours: Temp:  [97.7 F (36.5 C)-98.8 F (37.1 C)] 98.8 F (37.1 C) (01/19 0924) Pulse Rate:  [89-95] 95 (01/19 0924) Resp:  [14-18] 16 (01/19 0924) BP: (94-107)/(50-67) 100/50 (01/19 0924) SpO2:  [96 %-100 %] 97 % (01/19 0924) Weight:  [71.9 kg] 71.9 kg (01/18 2217) Last BM Date: 03/30/21  Intake/Output from previous day: 01/18 0701 - 01/19 0700 In: 5917.9 [I.V.:1774; IV Piggyback:4143.9] Out: 1600 [Urine:1600] Intake/Output this shift: No intake/output data recorded.  PE: Sacral wound:    Lab Results:  Recent Labs    03/29/2021 0840 04/09/2021 2216  WBC 26.3* 27.4*  HGB 13.6 11.4*  HCT 44.5 36.4*  PLT 337 229   BMET Recent Labs    03/30/2021 0840 04/03/2021 2216 04/01/21 0333  NA 150*  --  151*  K 4.5  --  3.8  CL 114*  --  116*  CO2 24  --  23  GLUCOSE 159*  --  107*  BUN 102*  --  71*  CREATININE 2.42* 1.54* 1.35*  CALCIUM 9.8  --  9.2   PT/INR Recent Labs    03/24/2021 0840 04/01/21 0333  LABPROT 15.7* 17.4*  INR 1.3* 1.4*   CMP     Component Value Date/Time   NA 151 (H) 04/01/2021 0333   K 3.8 04/01/2021 0333   CL 116 (H) 04/01/2021 0333   CO2 23 04/01/2021 0333   GLUCOSE 107 (H) 04/01/2021 0333   GLUCOSE 114 (H) 01/31/2006 1101   BUN 71 (H) 04/01/2021 0333   CREATININE 1.35 (H) 04/01/2021 0333   CALCIUM 9.2 04/01/2021 0333   PROT 5.4 (L) 04/01/2021 0333   ALBUMIN 2.0 (L) 04/01/2021 0333   AST 25 04/01/2021 0333   ALT 31 04/01/2021 0333   ALKPHOS 78 04/01/2021 0333   BILITOT 1.0 04/01/2021 0333   GFRNONAA 52 (L) 04/01/2021 0333   GFRAA 123 02/05/2008 0846   Lipase  No results found for: LIPASE     Studies/Results: CT CHEST WO CONTRAST  Result Date: 04/03/2021 CLINICAL DATA:  Possible sepsis.  Respiratory illness. EXAM: CT CHEST WITHOUT  CONTRAST TECHNIQUE: Multidetector CT imaging of the chest was performed following the standard protocol without IV contrast. RADIATION DOSE REDUCTION: This exam was performed according to the departmental dose-optimization program which includes automated exposure control, adjustment of the mA and/or kV according to patient size and/or use of iterative reconstruction technique. COMPARISON:  Abdomen CT 02/22/2007. FINDINGS: Cardiovascular: The heart size is normal. No substantial pericardial effusion. Coronary artery calcification is evident. Mild atherosclerotic calcification is noted in the wall of the thoracic aorta. Mediastinum/Nodes: No mediastinal lymphadenopathy. No evidence for gross hilar lymphadenopathy although assessment is limited by the lack of intravenous contrast on the current study. The esophagus has normal imaging features. 2.6 cm exophytic nodule noted posterior left thyroid gland. There is no axillary lymphadenopathy. Lungs/Pleura: Patchy airspace disease in the posterior right lower lobe is compatible with pneumonia. There is some dependent atelectasis in both lower lungs. No pulmonary edema or substantial pleural effusion. Upper Abdomen: Innumerable large hepatic cysts are again noted, some with mural calcification. Findings are not substantially progressive when comparing to the remote abdomen CT. Left renal cysts are evident. Musculoskeletal: No worrisome lytic or sclerotic osseous abnormality. Sclerotic lesion in the T10 vertebral body  is new in the interval since 2008, but likely benign. IMPRESSION: 1. Patchy airspace disease in the posterior right lower lobe compatible with pneumonia. Consider follow-up CT chest in 3 months to ensure resolution. 2. 2.6 cm exophytic nodule posterior left thyroid gland. This has been evaluated on previous imaging. (ref: J Am Coll Radiol. 2015 Feb;12(2): 143-50). 3. Hepatic and left renal cysts. 4. Aortic Atherosclerosis (ICD10-I70.0). Electronically Signed    By: Misty Stanley M.D.   On: 03/20/2021 15:07   CT PELVIS WO CONTRAST  Result Date: 04/05/2021 CLINICAL DATA:  Sacral decubitus ulcer. Osteomyelitis suspected. Possible sepsis. EXAM: CT PELVIS WITHOUT CONTRAST TECHNIQUE: Multidetector CT imaging of the pelvis was performed following the standard protocol without intravenous contrast. RADIATION DOSE REDUCTION: This exam was performed according to the departmental dose-optimization program which includes automated exposure control, adjustment of the mA and/or kV according to patient size and/or use of iterative reconstruction technique. COMPARISON:  Limited correlation made with one view abdomen 03/05/2021 and abdominal CT 03/05/2021. FINDINGS: Urinary Tract: The visualized distal ureters appear unremarkable. A Foley catheter is in place with air in the bladder lumen. There is generalized bladder wall thickening. Multiple small dependent calculi are present within the bladder lumen. Bowel: No bowel wall thickening, distention or surrounding inflammation identified within the pelvis. Mildly prominent rectal stool. Vascular/Lymphatic: No enlarged pelvic lymph nodes identified. Mild iliofemoral atherosclerosis. Reproductive: The prostate gland and seminal vesicles appear unremarkable. Other: No intrapelvic fluid collection or free air identified. Musculoskeletal: There is soft tissue ulceration along the inferior aspect of the intergluteal fold, with soft tissue emphysema extending inferior to the coccyx, asymmetric to the left. There are inflammatory changes within the surrounding subcutaneous fat, although no foreign body is identified. There is no definite bone destruction to suggest osteomyelitis. There are mild degenerative changes of the hips and sacroiliac joints bilaterally. No significant joint effusions. Moderate facet arthropathy noted in the lower lumbar spine. IMPRESSION: 1. Soft tissue decubitus ulceration over the distal sacrum and coccyx with  associated soft tissue emphysema suspicious for soft tissue infection. No drainable abscess identified. 2. No evidence of pelvic osteomyelitis. 3. Degenerative changes in the lower lumbar spine, sacroiliac joints and hips without significant joint effusions. 4. Multiple small bladder calculi with nonspecific bladder wall thickening. Foley catheter in place. Electronically Signed   By: Richardean Sale M.D.   On: 03/19/2021 15:13   US RENAL  Result Date: 04/01/2021 CLINICAL DATA:  AKI. EXAM: RENAL / URINARY TRACT ULTRASOUND COMPLETE COMPARISON:  02/22/2007. FINDINGS: Right Kidney: Renal measurements: 13.7 x 5.8 x 7.3 cm = volume: 503.6 mL. Echogenicity within normal limits. No renal calculus. There is mild hydronephrosis. Left Kidney: Renal measurements: 12.5 x 5.9 x 4.9 cm = volume: 188.8 mL. Echogenicity within normal limits. There is a cyst in the midpole measuring 2.3 x 1.8 x 1.8 cm with a thin rim calcification. A cyst is present in the upper pole measuring 2.1 x 2.2 x 2.3 cm. No renal calculi. Moderate hydronephrosis is noted. Bladder: The bladder is nondistended and a Foley catheter is in place. Other: Limited evaluation due to patient's body habitus and immobility. Incidental note of multiple cysts in the liver. IMPRESSION: 1. Moderate hydronephrosis on the left and mild hydronephrosis on the right. 2. Left renal cysts, 1 with a thin rim calcification. 3. Foley catheter in the urinary bladder. Electronically Signed   By: Brett Fairy M.D.   On: 04/01/2021 02:11   DG Chest Port 1 View  Result Date: 03/18/2021 CLINICAL  DATA:  Sepsis EXAM: PORTABLE CHEST 1 VIEW COMPARISON:  Chest x-ray 02/28/2021 FINDINGS: Heart size and mediastinal contours are within normal limits. No suspicious pulmonary opacities identified. No pleural effusion or pneumothorax visualized. No acute osseous abnormality appreciated. IMPRESSION: No acute intrathoracic process identified. Electronically Signed   By: Ofilia Neas M.D.    On: 03/25/2021 09:03    Anti-infectives: Anti-infectives (From admission, onward)    Start     Dose/Rate Route Frequency Ordered Stop   04/02/21 1200  vancomycin (VANCOREADY) IVPB 1500 mg/300 mL  Status:  Discontinued        1,500 mg 150 mL/hr over 120 Minutes Intravenous Every 48 hours 04/13/2021 1308 04/01/21 0920   04/01/21 1200  vancomycin (VANCOREADY) IVPB 1000 mg/200 mL        1,000 mg 200 mL/hr over 60 Minutes Intravenous Daily 04/01/21 0920     04/01/21 1000  ceFEPIme (MAXIPIME) 2 g in sodium chloride 0.9 % 100 mL IVPB        2 g 200 mL/hr over 30 Minutes Intravenous Every 12 hours 04/01/21 0924     04/04/2021 1400  ceFEPIme (MAXIPIME) 2 g in sodium chloride 0.9 % 100 mL IVPB  Status:  Discontinued        2 g 200 mL/hr over 30 Minutes Intravenous Every 24 hours 03/30/2021 1305 04/01/21 0924   04/10/2021 1400  metroNIDAZOLE (FLAGYL) IVPB 500 mg        500 mg 100 mL/hr over 60 Minutes Intravenous Every 12 hours 03/30/2021 1305     04/01/2021 1130  vancomycin (VANCOREADY) IVPB 1500 mg/300 mL        1,500 mg 150 mL/hr over 120 Minutes Intravenous  Once 04/04/2021 1124 03/17/2021 1432   04/04/2021 1115  cefTRIAXone (ROCEPHIN) 2 g in sodium chloride 0.9 % 100 mL IVPB        2 g 200 mL/hr over 30 Minutes Intravenous  Once 03/29/2021 1107 04/09/2021 1204        Assessment/Plan Necrotic sacral decubitus ulcer, unstagable - CT negative for underlying abscess, no significant purulence or cellulitis noted on exam. Unsure this is his sole source of sepsis. Continue broad spectrum antibiotics and further work up. - Will add santyl to BID dressing changes to try and soften necrotic tissue. Patient tolerated small amount of debridement well at bedside. Hopefully we can continue to clean the wound up well at bedside and avoid any surgical intervention. Given patients age, comorbidities, and advanced dementia, would like to avoid placing him under a general anesthetic if possible. Continue PT/hydrotherapy. I  did call and speak with the patient's wife. She is unsure how aggressive they would want to be with management of his wound at this time. She agreed to continuing local wound care for now.   Sepsis  Acute encephalopathy Bilateral hydronephrosis L>R COVID-19 - admitted 12/16 due to this, received IV remdesivir 12/21-12/23 Diabetes Mellitus, type II  HLD Lewy body dementia Hyperthyroidism PMH a.fib with RVR in the setting of acute illness (COVID) - not currently on amiodarone or anticoagulation. Physical deconditioning    FEN - NPO, IVF VTE - SCD's, ok for chemical DVT ppx from [facility] perspective  ID - Maxipime/ vancomycin/ flagyl Admit - to [facility] service   Straightforward Medical Decision Making   LOS: 1 day    Wellington Hampshire, [facility] 04/01/2021, 2:27 PM Please see Amion for pager number during day hours 7:00am-4:30pm

## 2021-04-01 NOTE — Progress Notes (Signed)
Manufacturing engineer [facility]) Hospital Liaison Note  Received referral for hospice services from [facility]. Reached out to confirm with wife who requested that we call her son to speak about this patient. Spoke to patient son [name] who stated that the family is not interested in hospice services at this time as they are still awaiting to discuss patient condition and treatment with hospital MD.   [facility] is willing to support with education, hospice services or answer questions when family is ready.   Please contact [facility] as needed with hospice related concerns,  Gar Ponto, RN [facility] [phone]

## 2021-04-01 NOTE — Assessment & Plan Note (Addendum)
Present on admission. Likely secondary to sacral decubitus ulcer. Procalcitonin of 0.31. Afebrile with leukocytosis. Antibiotics initiated on admission and blood cultures obtained. One set of blood cultures significant for Bacteroides Thetaiotaomicron. Leukocytosis improved with change in antibiotics.  - Patient now transitioned to comfort measures. - per palliative care, not considered stable for discharge to residential hospice; recommendation is to remain inpatient for anticipated in-hospital death

## 2021-04-01 NOTE — Assessment & Plan Note (Signed)
Well controlled. Last hemoglobin A1C of 5.7% from 12/22.

## 2021-04-01 NOTE — Assessment & Plan Note (Signed)
Likely related to acute urinary retention. Left greater than right. Associated AKI on admission.

## 2021-04-01 NOTE — Progress Notes (Signed)
PROGRESS NOTE    Erik Lara  QPR:[id number] DOB: Jul 29, 1935 DOA: 03/15/2021 PCP: Dorothyann Peng, NP   Brief Narrative: Erik Lara is 86 y.o. male with a history of hyperlipidemia, lewy body dementia, diabetes mellitus, sacral pressure injury. Patient presented secondary to altered mental status and shortness of breath found to have evidence of sepsis secondary to a sacral decubitus ulcer.   Assessment & Plan:   * Sepsis ([place])- (present on admission) Present on admission. Likely secondary to sacral decubitus ulcer. Procalcitonin of 0.31. Afebrile with leukocytosis. Antibiotics initiated on admission and blood cultures are pending. -CBC in AM -Continue antibiotics, Decubitus ulcer of sacral region, unstageable ([place]) -Follow-up blood cultures  Decubitus ulcer of sacral region, unstageable [facility]) Concern that this is a source for infection/sepsis. General surgery consulted. CT pelvis significant for no abscess formation. Started empirically on Vancomycin, Cefepime and Flagyl -Continue Vancomycin/Cefepime/Flagyl -General surgery recommendations: pending today  Hypernatremia Unsure of chronicity. Secondary to poor oral intake. -D5 water IV -BMP in AM  AKI (acute kidney injury) ([place]) Creatinine of 2.42 on admission. Secondary to acute urinary retention in addition to decreased oral intake. Foley catheter placed and IV fluids given with improving creatinine.  Hydronephrosis Likely related to acute urinary retention. Left greater than right. Associated AKI on admission.  Pressure injury of skin- (present on admission) Left pretibial, left heel, right heel, medial sacrum, anterior/left ankle, anterior/left knee, posterior/right elbow. Wound care consulted for wound management recommendations.  COVID-19 virus infection- (present on admission) Previously diagnosed on 02/26/2021. Currently asymptomatic. No treatment initiated this admission. Chest x-ray without acute  process.  Lewy body dementia ([name])- (present on admission) Patient follows with neurology. -Hold Namenda and Aricept at this time  Type 2 diabetes mellitus without complication, without long-term current use of insulin ([name]) Well controlled. Last hemoglobin A1C of 5.7% from 12/22.  Hyperlipidemia- (present on admission) -Hold home Crestor  Acute urinary retention Unknown etiology. Bladder scan in the ED was significant for 1200 mL of measured urine. Foley catheter placed. Urine culture with no growth. Associated left moderate and right mild hydronephrosis. -Continue foley catheter  Acute metabolic encephalopathy Possibly multifactorial in setting of underlying dementia, hypernatremia, acute infection.  Abnormal TSH- (present on admission) Recent TSH from 12/18 of 0.143 with free T4 of 1.39 (elevated) and T3 of 57 (low). Concerning for hyperthyroidism. -Repeat TSH, free T4, T3     DVT prophylaxis: Heparin subcu Code Status:   Code Status: DNR Family Communication: Wife on telephone (11 minutes), Son on telephone (21 minutes) Disposition Plan: Discharge pending continued treatment of sacral decubitus ulcer, continued goals of care   Consultants:  General surgery Palliative care medicine  Procedures:  None  Antimicrobials: Vancomycin Cefepime Flagyl    Subjective: Non-verbal  Objective: Vitals:   03/26/2021 2217 04/01/21 0130 04/01/21 0522 04/01/21 0924  BP:  106/67 (!) 102/55 (!) 100/50  Pulse:  91 92 95  Resp:  16 16 16   Temp:  97.7 F (36.5 C) 98.8 F (37.1 C) 98.8 F (37.1 C)  TempSrc:  Oral Oral Axillary  SpO2: 96% 96% 98% 97%  Weight: 71.9 kg     Height: 6\' 1"  (1.854 m)       Intake/Output Summary (Last 24 hours) at 04/01/2021 1528 Last data filed at 04/01/2021 0138 Gross per 24 hour  Intake 1979.72 ml  Output 1600 ml  Net 379.72 ml   Filed Weights   04/06/2021 0935 04/08/2021 2217  Weight: 81.4 kg 71.9 kg    Examination:  General exam: Appears  calm and comfortable  Respiratory system: Clear to auscultation. Respiratory effort normal. Cardiovascular system: S1 & S2 heard, RRR. No murmurs, rubs, gallops or clicks. Gastrointestinal system: Abdomen is nondistended, soft and nontender. No organomegaly or masses felt. Normal bowel sounds heard. Central nervous system: Alert and not oriented. Does not follow commands. Musculoskeletal: No edema. No calf tenderness   Data Reviewed: I have personally reviewed following labs and imaging studies  CBC Lab Results  Component Value Date   WBC 27.4 (H) 03/27/2021   RBC 3.82 (L) 04/07/2021   HGB 11.4 (L) 03/29/2021   HCT 36.4 (L) 03/26/2021   MCV 95.3 03/25/2021   MCH 29.8 03/20/2021   PLT 229 04/06/2021   MCHC 31.3 03/25/2021   RDW 14.1 03/30/2021   LYMPHSABS 0.5 (L) 03/26/2021   MONOABS 0.7 03/27/2021   EOSABS 0.0 03/22/2021   BASOSABS 0.1 69/67/8938     Last metabolic panel Lab Results  Component Value Date   NA 151 (H) 04/01/2021   K 3.8 04/01/2021   CL 116 (H) 04/01/2021   CO2 23 04/01/2021   BUN 71 (H) 04/01/2021   CREATININE 1.35 (H) 04/01/2021   GLUCOSE 107 (H) 04/01/2021   GFRNONAA 52 (L) 04/01/2021   GFRAA 123 02/05/2008   CALCIUM 9.2 04/01/2021   PHOS 3.1 03/03/2021   PROT 5.4 (L) 04/01/2021   ALBUMIN 2.0 (L) 04/01/2021   BILITOT 1.0 04/01/2021   ALKPHOS 78 04/01/2021   AST 25 04/01/2021   ALT 31 04/01/2021   ANIONGAP 12 04/01/2021    CBG (last 3)  Recent Labs    04/02/2021 2338 04/01/21 0530  GLUCAP 105* 101*     GFR: Estimated Creatinine Clearance: 41.4 mL/min (A) (by C-G formula based on SCr of 1.35 mg/dL (H)).  Coagulation Profile: Recent Labs  Lab 03/17/2021 0840 04/01/21 0333  INR 1.3* 1.4*    Recent Results (from the past 240 hour(s))  Blood Culture (routine x 2)     Status: None (Preliminary result)   Collection Time: 03/20/2021  8:40 AM   Specimen: BLOOD  Result Value Ref Range Status   Specimen Description   Final    BLOOD RIGHT  ARM Performed at [facility] [address] [place], [place] [address]    Special Requests   Final    BOTTLES DRAWN AEROBIC AND ANAEROBIC Blood Culture adequate volume Performed at [facility] [address], [place], [place] [address]    Culture   Final    NO GROWTH < 24 HOURS Performed at [facility] [address], [place], [place] [address]    Report Status PENDING  Incomplete  Urine Culture     Status: None   Collection Time: 03/22/2021  8:47 AM   Specimen: In/Out Cath Urine  Result Value Ref Range Status   Specimen Description   Final    IN/OUT CATH URINE Performed at [facility] [address], [place], [place] [address]    Special Requests   Final    NONE Performed at [facility], [place] [address], [place], [place] [address]    Culture   Final    NO GROWTH Performed at [facility], [place] [address], [place], [place] [address]    Report Status 04/01/2021 FINAL  Final  Resp Panel by RT-PCR (Flu A&B, Covid) Nasopharyngeal Swab     Status: Abnormal   Collection Time: 04/07/2021  8:49 AM   Specimen:  Nasopharyngeal Swab; Nasopharyngeal(NP) swabs in vial transport medium  Result Value Ref Range Status   SARS Coronavirus 2 by RT PCR POSITIVE (A) NEGATIVE Final    Comment: (NOTE) SARS-CoV-2 target nucleic acids are DETECTED.  The SARS-CoV-2 RNA is generally detectable in upper respiratory specimens during the acute phase of infection. Positive results are indicative of the presence of the identified virus, but do not rule out bacterial infection or co-infection with other pathogens not detected by the test. Clinical correlation with patient history and other diagnostic information is necessary to determine patient infection status. The expected result is Negative.  Fact Sheet for Patients: EntrepreneurPulse.com.au  Fact Sheet for Healthcare  Providers: IncredibleEmployment.be  This test is not yet approved or cleared by the [place] FDA and  has been authorized for detection and/or diagnosis of SARS-CoV-2 by FDA under an Emergency Use Authorization (EUA).  This EUA will remain in effect (meaning this test can be used) for the duration of  the COVID-19 declaration under Section 564(b)(1) of the A ct, 21 U.S.C. section 360bbb-3(b)(1), unless the authorization is terminated or revoked sooner.     Influenza A by PCR NEGATIVE NEGATIVE Final   Influenza B by PCR NEGATIVE NEGATIVE Final    Comment: (NOTE) The Xpert Xpress SARS-CoV-2/FLU/RSV plus assay is intended as an aid in the diagnosis of influenza from Nasopharyngeal swab specimens and should not be used as a sole basis for treatment. Nasal washings and aspirates are unacceptable for Xpert Xpress SARS-CoV-2/FLU/RSV testing.  Fact Sheet for Patients: EntrepreneurPulse.com.au  Fact Sheet for Healthcare Providers: IncredibleEmployment.be  This test is not yet approved or cleared by the [place] FDA and has been authorized for detection and/or diagnosis of SARS-CoV-2 by FDA under an Emergency Use Authorization (EUA). This EUA will remain in effect (meaning this test can be used) for the duration of the COVID-19 declaration under Section 564(b)(1) of the Act, 21 U.S.C. section 360bbb-3(b)(1), unless the authorization is terminated or revoked.  Performed at [facility], [place] [address], [place], [place] [address]   Blood Culture (routine x 2)     Status: None (Preliminary result)   Collection Time: 03/16/2021 11:44 AM   Specimen: BLOOD LEFT HAND  Result Value Ref Range Status   Specimen Description   Final    BLOOD LEFT HAND Performed at [facility] [address], [place], [place] [address]    Special Requests   Final    BOTTLES DRAWN AEROBIC AND ANAEROBIC Blood  Culture results may not be optimal due to an inadequate volume of blood received in culture bottles Performed at [facility] [address] [place], [place] [address]    Culture   Final    NO GROWTH < 24 HOURS Performed at [facility] [address] [place], [place] [address]    Report Status PENDING  Incomplete  MRSA Next Gen by PCR, Nasal     Status: None   Collection Time: 04/01/21 11:11 AM   Specimen: Nasal Mucosa; Nasal Swab  Result Value Ref Range Status   MRSA by PCR Next Gen NOT DETECTED NOT DETECTED Final    Comment: (NOTE) The GeneXpert MRSA Assay (FDA approved for NASAL specimens only), is one component of a comprehensive MRSA colonization surveillance program. It is not intended to diagnose MRSA infection nor to guide or monitor treatment for MRSA infections. Test performance is not FDA approved in patients less than 60 years old. Performed at [facility], [place] [address], [place], [place] [address]         Radiology Studies: CT CHEST WO CONTRAST  Result Date: 03/30/2021 CLINICAL DATA:  Possible sepsis.  Respiratory illness. EXAM: CT CHEST WITHOUT CONTRAST TECHNIQUE: Multidetector CT imaging of the chest was performed following the standard protocol without IV contrast. RADIATION DOSE REDUCTION: This exam was performed according to the departmental dose-optimization program which includes automated exposure control, adjustment of the mA and/or kV according to patient size and/or use of iterative reconstruction technique. COMPARISON:  Abdomen CT 02/22/2007. FINDINGS: Cardiovascular: The heart size is normal. No substantial pericardial effusion. Coronary artery calcification is evident. Mild atherosclerotic calcification is noted in the wall of the thoracic aorta. Mediastinum/Nodes: No mediastinal lymphadenopathy. No evidence for gross hilar lymphadenopathy although assessment is limited by the lack of intravenous contrast on  the current study. The esophagus has normal imaging features. 2.6 cm exophytic nodule noted posterior left thyroid gland. There is no axillary lymphadenopathy. Lungs/Pleura: Patchy airspace disease in the posterior right lower lobe is compatible with pneumonia. There is some dependent atelectasis in both lower lungs. No pulmonary edema or substantial pleural effusion. Upper Abdomen: Innumerable large hepatic cysts are again noted, some with mural calcification. Findings are not substantially progressive when comparing to the remote abdomen CT. Left renal cysts are evident. Musculoskeletal: No worrisome lytic or sclerotic osseous abnormality. Sclerotic lesion in the T10 vertebral body is new in the interval since 2008, but likely benign. IMPRESSION: 1. Patchy airspace disease in the posterior right lower lobe compatible with pneumonia. Consider follow-up CT chest in 3 months to ensure resolution. 2. 2.6 cm exophytic nodule posterior left thyroid gland. This has been evaluated on previous imaging. (ref: J Am Coll Radiol. 2015 Feb;12(2): 143-50). 3. Hepatic and left renal cysts. 4. Aortic Atherosclerosis (ICD10-I70.0). Electronically Signed   By: Misty Stanley M.D.   On: 03/19/2021 15:07   CT PELVIS WO CONTRAST  Result Date: 04/09/2021 CLINICAL DATA:  Sacral decubitus ulcer. Osteomyelitis suspected. Possible sepsis. EXAM: CT PELVIS WITHOUT CONTRAST TECHNIQUE: Multidetector CT imaging of the pelvis was performed following the standard protocol without intravenous contrast. RADIATION DOSE REDUCTION: This exam was performed according to the departmental dose-optimization program which includes automated exposure control, adjustment of the mA and/or kV according to patient size and/or use of iterative reconstruction technique. COMPARISON:  Limited correlation made with one view abdomen 03/05/2021 and abdominal CT 03/05/2021. FINDINGS: Urinary Tract: The visualized distal ureters appear unremarkable. A Foley catheter is  in place with air in the bladder lumen. There is generalized bladder wall thickening. Multiple small dependent calculi are present within the bladder lumen. Bowel: No bowel wall thickening, distention or surrounding inflammation identified within the pelvis. Mildly prominent rectal stool. Vascular/Lymphatic: No enlarged pelvic lymph nodes identified. Mild iliofemoral atherosclerosis. Reproductive: The prostate gland and seminal vesicles appear unremarkable. Other: No intrapelvic fluid collection or free air identified. Musculoskeletal: There is soft tissue ulceration along the inferior aspect of the intergluteal fold, with soft tissue emphysema extending inferior to the coccyx, asymmetric to the left. There are inflammatory changes within the surrounding subcutaneous fat, although no foreign body is identified. There is no definite bone destruction to suggest osteomyelitis. There are mild degenerative changes of the hips and sacroiliac joints bilaterally. No significant joint effusions. Moderate facet arthropathy noted in the lower lumbar spine. IMPRESSION: 1. Soft tissue decubitus ulceration over the distal sacrum and coccyx with associated soft tissue emphysema suspicious for soft tissue infection. No drainable abscess identified. 2.  No evidence of pelvic osteomyelitis. 3. Degenerative changes in the lower lumbar spine, sacroiliac joints and hips without significant joint effusions. 4. Multiple small bladder calculi with nonspecific bladder wall thickening. Foley catheter in place. Electronically Signed   By: Richardean Sale M.D.   On: 04/02/2021 15:13   US RENAL  Result Date: 04/01/2021 CLINICAL DATA:  AKI. EXAM: RENAL / URINARY TRACT ULTRASOUND COMPLETE COMPARISON:  02/22/2007. FINDINGS: Right Kidney: Renal measurements: 13.7 x 5.8 x 7.3 cm = volume: 503.6 mL. Echogenicity within normal limits. No renal calculus. There is mild hydronephrosis. Left Kidney: Renal measurements: 12.5 x 5.9 x 4.9 cm = volume:  188.8 mL. Echogenicity within normal limits. There is a cyst in the midpole measuring 2.3 x 1.8 x 1.8 cm with a thin rim calcification. A cyst is present in the upper pole measuring 2.1 x 2.2 x 2.3 cm. No renal calculi. Moderate hydronephrosis is noted. Bladder: The bladder is nondistended and a Foley catheter is in place. Other: Limited evaluation due to patient's body habitus and immobility. Incidental note of multiple cysts in the liver. IMPRESSION: 1. Moderate hydronephrosis on the left and mild hydronephrosis on the right. 2. Left renal cysts, 1 with a thin rim calcification. 3. Foley catheter in the urinary bladder. Electronically Signed   By: Brett Fairy M.D.   On: 04/01/2021 02:11   DG Chest Port 1 View  Result Date: 04/04/2021 CLINICAL DATA:  Sepsis EXAM: PORTABLE CHEST 1 VIEW COMPARISON:  Chest x-ray 02/28/2021 FINDINGS: Heart size and mediastinal contours are within normal limits. No suspicious pulmonary opacities identified. No pleural effusion or pneumothorax visualized. No acute osseous abnormality appreciated. IMPRESSION: No acute intrathoracic process identified. Electronically Signed   By: Ofilia Neas M.D.   On: 04/01/2021 09:03        Scheduled Meds:  Chlorhexidine Gluconate Cloth  6 each Topical Daily   collagenase   Topical Daily   heparin  5,000 Units Subcutaneous Q8H   Continuous Infusions:  ceFEPime (MAXIPIME) IV 2 g (04/01/21 1109)   dextrose     metronidazole 500 mg (04/01/21 1319)   vancomycin 1,000 mg (04/01/21 1154)     LOS: 1 day     Cordelia Poche, MD [facility] 04/01/2021, 3:28 PM  If 7PM-7AM, please contact night-coverage [email or web address]

## 2021-04-01 NOTE — Assessment & Plan Note (Addendum)
Left pretibial, left heel, right heel, medial sacrum, anterior/left ankle, anterior/left knee, posterior/right elbow.

## 2021-04-01 NOTE — Assessment & Plan Note (Signed)
Previously diagnosed on 02/26/2021. Currently asymptomatic. No treatment initiated this admission. Chest x-ray without acute process.

## 2021-04-01 NOTE — TOC Initial Note (Signed)
Transition of Care [facility]) - Initial/Assessment Note    Patient Details  Name: Erik Lara MRN: [id number] Date of Birth: August 31, 1935  [facility] of [facility]) CM/SW Contact:    Leeroy Cha, RN Phone Number: 04/01/2021, 8:50 AM  Clinical Narrative:                 From [facility] snf plan is to return  Expected Discharge Plan: [place] Barriers to Discharge: Continued Medical Work up   Patient Goals and CMS Choice Patient states their goals for this hospitalization and ongoing recovery are:: not stated   Choice offered to / list presented to : Spouse  Expected Discharge Plan and Services Expected Discharge Plan: [place]   Discharge Planning Services: CM Consult Post Acute Care Choice: [place] Living arrangements for the past 2 months: [place] ([place])                                      Prior Living Arrangements/Services Living arrangements for the past 2 months: [place] ([place]) Lives with:: Facility Resident Patient language and need for interpreter reviewed:: Yes              Criminal Activity/Legal Involvement Pertinent to Current Situation/Hospitalization: No - Comment as needed  Activities of Daily Living      Permission Sought/Granted                  Emotional Assessment Appearance:: Appears stated age Attitude/Demeanor/Rapport: Unable to Assess Affect (typically observed): Unable to Assess Orientation: : Fluctuating Orientation (Suspected and/or reported [place]) Alcohol / Substance Use: Not Applicable Psych Involvement: No (comment)  Admission diagnosis:  Hypernatremia [E87.0] Wound infection [T14.8XXA, L08.9] AKI (acute kidney injury) ([place]) [N17.9] Sepsis ([place]) [A41.9] Pressure injury of sacral region, unstageable ([place]) [L89.150] Other cough [R05.8] Patient Active Problem List   Diagnosis Date Noted   Sepsis ([place])  03/27/2021   Abnormal TSH    Generalized weakness    Pressure injury of skin 03/05/2021   Superficial venous thrombosis of arm, left 03/04/2021   Atrial fibrillation with RVR ([place]) 03/03/2021   Physical deconditioning 03/03/2021   COVID-19 virus infection 02/27/2021   2019 novel coronavirus disease (COVID-19) 02/26/2021   Hypocalcemia 02/26/2021   Mild protein malnutrition ([place]) 02/26/2021   Normocytic anemia 02/26/2021   Hearing loss of right ear 01/23/2017   Mild neurocognitive disorder 09/13/2016   Type 2 diabetes mellitus without complication, without long-term current use of insulin ([place]) 03/30/2015   Hyperthyroidism 01/28/2013   Erectile Dysfunction 02/19/2008   Allergic rhinitis 02/19/2008   Hyperlipidemia 02/15/2007   Hepatic Cyst 02/15/2007   PCP:  Dorothyann Peng, NP Pharmacy:   [facility] [phone] [place], [place] [address] Phone: [phone] Fax: [phone]     Social Determinants of Health ([place]) Interventions    Readmission Risk Interventions No flowsheet data found.

## 2021-04-01 NOTE — Assessment & Plan Note (Addendum)
Unknown etiology. Bladder scan in the ED was significant for 1200 mL of measured urine. Foley catheter placed. Urine culture with no growth. Associated left moderate and right mild hydronephrosis. -Continue foley catheter for comfort

## 2021-04-01 NOTE — Hospital Course (Addendum)
Erik Lara is 86 y.o. male with a history of hyperlipidemia, lewy body dementia, diabetes mellitus, sacral pressure injury. Patient presented secondary to altered mental status and shortness of breath found to have evidence of sepsis secondary to a sacral decubitus ulcer. Blood cultures significant for bacteroides thetaiotaomicron. General surgery on board for debridement of decubitus ulcer. Patient is now transitioned to comfort measures.

## 2021-04-01 NOTE — Assessment & Plan Note (Addendum)
Recent TSH from 12/18 of 0.143 with free T4 of 1.39 (elevated) and T3 of 57 (low). Concerning for hyperthyroidism. Repeat TSH and free T4 are normal. Comfort measures.

## 2021-04-01 NOTE — Assessment & Plan Note (Addendum)
Possibly multifactorial in setting of underlying dementia, hypernatremia, acute infection. CT head unremarkable for acute process and EEG significant for evidence of diffuse cerebral dysfunction but no evidence of seizure activity.

## 2021-04-01 NOTE — Assessment & Plan Note (Addendum)
Unsure of chronicity. Secondary to poor oral intake. Initial worsening with peak of 152, now resolved with IV fluids. Comfort measures.

## 2021-04-01 NOTE — Consult Note (Signed)
Surgical team PA that consulted [place] will get images of LE wounds today for telehealth consultation.  Communication with Cecille Aver this am.  [place], [place], [place]

## 2021-04-01 NOTE — Assessment & Plan Note (Addendum)
Patient follows with neurology. Hold Namenda and Aricept

## 2021-04-01 NOTE — Progress Notes (Signed)
Physical Therapy Wound Evaluation Patient Details  Name: Erik Lara MRN: [id number] Date of Birth: October 26, 1935  Today's Date: 04/01/2021 Time: 8676-7209 Time Calculation (min): 42 min  Subjective  Subjective Assessment Subjective: Pt nonverbal, no grimacing throughout procedure, no signs of discomfort. Patient and Family Stated Goals: none stated, wound tissue to become healthier. Date of Onset:  (unknown) Prior Treatments: none known  Pain Score:    Wound Assessment  Pressure Injury 04/01/21 Sacrum Posterior Unstageable - Full thickness tissue loss in which the base of the injury is covered by slough (yellow, tan, gray, green or brown) and/or eschar (tan, brown or black) in the wound bed. PT HYDRO Only (Active)  Wound Image   04/01/21 1500  Dressing Type Gauze (Comment);Moist to dry;Impregnated gauze (bismuth);Silicone dressing 47/09/62 1500  Dressing Changed 04/01/21 1500  Dressing Change Frequency Daily 04/01/21 1500  State of Healing Non-healing 04/01/21 1500  Site / Wound Assessment Black;[place];Purple;Red;Yellow 04/01/21 1500  % Wound base Black/Eschar 95% 04/01/21 1500  % Wound base Other/Granulation Tissue (Comment) 5% 04/01/21 1500  Peri-wound Assessment Maceration;Erythema (blanchable);Purple 04/01/21 1500  Wound Length (cm) 10.4 cm 04/01/21 1500  Wound Width (cm) 10 cm 04/01/21 1500  Wound Surface Area (cm^2) 104 cm^2 04/01/21 1500  Margins Attached edges (approximated) 04/01/21 1500  Drainage Amount Scant 04/01/21 1500  Drainage Description Odor 04/01/21 1500  Treatment Cleansed;Debridement (Selective);Hydrotherapy (Pulse lavage);Packing (Impregnated strip) 04/01/21 1500      Hydrotherapy Pulsed lavage therapy - wound location: sacral ulcer Pulsed Lavage with Suction (psi): 8 psi Pulsed Lavage with Suction - Normal Saline Used: 1000 mL Pulsed Lavage Tip: Tip with splash shield Selective Debridement Selective Debridement - Location: sacral ulcer Selective  Debridement - Tools Used: Forceps, Scissors Selective Debridement - Tissue Removed: nectrotic tissue, devitalized tissue, slough    Wound Assessment and Plan  Wound Therapy - Assess/Plan/Recommendations Wound Therapy - Clinical Statement: Patient with recent hospitalization from 12/16-12/31 after a fall at home and COVID-19 infection. At discharge pt required Max Assist +2 for bed mobility and transfers and discharged to SNF. Pt returns to [facility] on 03/18/2021 for hypoxia and SOB and found to have necrotic sacral decubitus ulcer. Surgery recommending bedside debridement vs surgical intervention at this time but will continue to follow. Pt does not appear to have sensation and no signs of discomfort noted with hydrotherapy today. Wound bed covered with thin boggy necrotic eschar. Small area around 5-6 O'Clock debrided selctively and pulsed lavage performed to attempt to loosen eschar with limited success. Blanchable errythema noted at 9 o'clock extending 4cm and extending 2-3 cm along superior wound margins. Dressed with xeroform and saline guaze to moisten and loosen. Discussed with surgery and plan to begin santyl with saline gauze dressing tomorrow. PT hydrotherapy to continue 6x/week with nursing doing dressing change on Sunday. Will continue to follow for hydrotherapy to promote heathy tissue and reduce bioburden in wound bed. Wound Therapy - Functional Problem List: Decreased strength;Decreased mobility;Decreased cognition;Decreased range of motion;Impaired sensation;Decreased skin integrity; Factors Delaying/Impairing Wound Healing: Immobility, Multiple medical problems, Diabetes Mellitus, Infection - systemic/local Hydrotherapy Plan: Dressing change, Debridement, Pulsatile lavage with suction Wound Therapy - Frequency: 6X / week Wound Therapy - Current Recommendations: Surgery consult Wound Therapy - Follow Up Recommendations: f/u pulsed lavage with suction, f/u selective debridement, dressing changes  by RN  Wound Therapy Goals- Improve the function of patient's integumentary system by progressing the wound(s) through the phases of wound healing (inflammation - proliferation - remodeling) by: Wound Therapy Goals - Improve the function  of patient's integumentary system by progressing the wound(s) through the phases of wound healing by: Decrease Necrotic Tissue to: 30% Decrease Necrotic Tissue - Progress: Goal set today Increase Granulation Tissue to: 70% Increase Granulation Tissue - Progress: Goal set today Improve Drainage Characteristics: Min Improve Drainage Characteristics - Progress: Goal set today Goals/treatment plan/discharge plan were made with and agreed upon by patient/family: No, Patient unable to participate in goals/treatment/discharge plan and family unavailable Time For Goal Achievement: 2 weeks Wound Therapy - Potential for Goals: Poor  Goals will be updated until maximal potential achieved or discharge criteria met.  Discharge criteria: when goals achieved, discharge from hospital, MD decision/surgical intervention, no progress towards goals, refusal/missing three consecutive treatments without notification or medical reason.  GP     Charges PT Wound Care Charges $Wound Debridement up to 20 cm: < or equal to 20 cm $PT Hydrotherapy Dressing: 1 dressing $PT PLS Gun and Tip: 1 Supply $PT Hydrotherapy Visit: 1 Visit      Verner Mould, DPT Acute Rehabilitation Services Office [phone] Pager [phone]   Jacques Navy 04/01/2021, 4:24 PM

## 2021-04-01 NOTE — Consult Note (Signed)
[place] Nurse wound consult note Consultation was completed by review of records, images and assistance from the bedside nurse/clinical staff.  Reason for Consult:LE wounds Patient admitted with significant sacral pressure injury that surgical team is currently managing.  Hydrotherapy ordered, patient is not a good candidate for surgical debridement. Patient's family has refused Hospice services at this time. WOC was consulted for LE wounds that all appear to also be from pressure Wound type: Left lateral knee: Unstageable Pressure Injury Left medial knee: Unstageable Pressure Injury Left lateral 5th metatarsal head: Deep Tissue Pressure Injury Left lateral malleolus: Deep Tissue Pressure Injury Left lateral heel: Deep Tissue Pressure Injury Right heel: Deep Tissue Pressure Injury Pressure Injury POA: Yes Measurement: see nursing flow sheets Wound bed: areas of deep tissue pressure are dark purple and deep in appearance; 100% eschar to the left lateral and medial knee Drainage (amount, consistency, odor) see nursing flowsheet Periwound: intact  Dressing procedure/placement/frequency:  Add low air loss mattress for moisture management and pressure redistribution if not added per nursing skin care order set previously Add xeroform to the LE wounds, top with foam. Being conservative with wounds because of high risk of decline of open wounds of the LEs Add Prevalon boots to offload bilateral heels CCS is managing sacral wounds with plans for possible bedside debridement along with PT for hydrotherapy Add RD for supplementation for wound healing.   Patient is most appropriate for hospice care in my opinion at this time.  Multiple pressure injuries, advanced age, resides in SNF, hx of recent COVID+, nonverbal, Lewy body dementia.   Re consult if needed, will not follow at this time. Thanks  Shakiah Wester [employer], RN,CWOCN, CNS, [place] [phone])

## 2021-04-02 ENCOUNTER — Inpatient Hospital Stay (HOSPITAL_COMMUNITY)
Admit: 2021-04-02 | Discharge: 2021-04-02 | Disposition: A | Discharge status: Home / Self Care | Payer: PPO | Attending: Family Medicine | Admitting: Family Medicine

## 2021-04-02 ENCOUNTER — Inpatient Hospital Stay (HOSPITAL_COMMUNITY): Payer: PPO

## 2021-04-02 DIAGNOSIS — E43 Unspecified severe protein-calorie malnutrition: Secondary | ICD-10-CM | POA: Insufficient documentation

## 2021-04-02 DIAGNOSIS — Z7189 Other specified counseling: Secondary | ICD-10-CM

## 2021-04-02 DIAGNOSIS — R338 Other retention of urine: Secondary | ICD-10-CM | POA: Diagnosis not present

## 2021-04-02 DIAGNOSIS — G9341 Metabolic encephalopathy: Secondary | ICD-10-CM | POA: Diagnosis not present

## 2021-04-02 DIAGNOSIS — A419 Sepsis, unspecified organism: Secondary | ICD-10-CM | POA: Diagnosis not present

## 2021-04-02 DIAGNOSIS — R7989 Other specified abnormal findings of blood chemistry: Secondary | ICD-10-CM | POA: Diagnosis not present

## 2021-04-02 LAB — BLOOD CULTURE ID PANEL (REFLEXED) - BCID2

## 2021-04-02 LAB — GLUCOSE, CAPILLARY
Glucose-Capillary: 135 mg/dL — ABNORMAL HIGH (ref 70–99)
Glucose-Capillary: 141 mg/dL — ABNORMAL HIGH (ref 70–99)
Glucose-Capillary: 144 mg/dL — ABNORMAL HIGH (ref 70–99)
Glucose-Capillary: 144 mg/dL — ABNORMAL HIGH (ref 70–99)

## 2021-04-02 LAB — BASIC METABOLIC PANEL
Anion gap: 8 (ref 5–15)
BUN: 60 mg/dL — ABNORMAL HIGH (ref 8–23)
CO2: 21 mmol/L — ABNORMAL LOW (ref 22–32)
Calcium: 8.7 mg/dL — ABNORMAL LOW (ref 8.9–10.3)
Chloride: 123 mmol/L — ABNORMAL HIGH (ref 98–111)
Creatinine, Ser: 1.21 mg/dL (ref 0.61–1.24)
GFR, Estimated: 59 mL/min — ABNORMAL LOW (ref >60)
Glucose, Bld: 150 mg/dL — ABNORMAL HIGH (ref 70–99)
Potassium: 3 mmol/L — ABNORMAL LOW (ref 3.5–5.1)
Sodium: 152 mmol/L — ABNORMAL HIGH (ref 135–145)

## 2021-04-02 LAB — CBC
HCT: 34.9 % — ABNORMAL LOW (ref 39.0–52.0)
Hemoglobin: 10.8 g/dL — ABNORMAL LOW (ref 13.0–17.0)
MCH: 29.8 pg (ref 26.0–34.0)
MCHC: 30.9 g/dL (ref 30.0–36.0)
MCV: 96.4 fL (ref 80.0–100.0)
Platelets: 219 10*3/uL (ref 150–400)
RBC: 3.62 MIL/uL — ABNORMAL LOW (ref 4.22–5.81)
RDW: 14 % (ref 11.5–15.5)
WBC: 30.6 10*3/uL — ABNORMAL HIGH (ref 4.0–10.5)
nRBC: 0 % (ref 0.0–0.2)

## 2021-04-02 LAB — MAGNESIUM: Magnesium: 2.3 mg/dL (ref 1.7–2.4)

## 2021-04-02 MED ORDER — ACETAMINOPHEN 325 MG PO TABS: 650.0000 mg | ORAL_TABLET | Freq: Four times a day (QID) | ORAL | Status: DC | PRN

## 2021-04-02 MED ORDER — VANCOMYCIN HCL 1250 MG/250ML IV SOLN
1250.0000 mg | INTRAVENOUS | Status: DC
  Administered 2021-04-03: 1250 mg via INTRAVENOUS
  Filled 2021-04-02 (×2): qty 250

## 2021-04-02 MED ORDER — ACETAMINOPHEN 650 MG RE SUPP
650.0000 mg | Freq: Four times a day (QID) | RECTAL | Status: DC | PRN
  Filled 2021-04-02: qty 1

## 2021-04-02 MED ORDER — LIP MEDEX EX OINT
TOPICAL_OINTMENT | CUTANEOUS | Status: DC | PRN
  Administered 2021-04-02: 75 via TOPICAL
  Filled 2021-04-02: qty 7

## 2021-04-02 MED ORDER — POTASSIUM CHLORIDE 10 MEQ/100ML IV SOLN
10.0000 meq | INTRAVENOUS | Status: AC
  Administered 2021-04-02 – 2021-04-03 (×6): 10 meq via INTRAVENOUS
  Filled 2021-04-02 (×6): qty 100

## 2021-04-02 MED ORDER — DAKINS (1/4 STRENGTH) 0.125 % EX SOLN
Freq: Two times a day (BID) | CUTANEOUS | Status: DC
Start: 2021-04-02 — End: 2021-04-02

## 2021-04-02 MED ORDER — DAKINS (1/4 STRENGTH) 0.125 % EX SOLN
Freq: Two times a day (BID) | CUTANEOUS | Status: AC
  Filled 2021-04-02 (×2): qty 473

## 2021-04-02 NOTE — Assessment & Plan Note (Signed)
Dietitian consulted. Patient is currently NPO secondary to mental status.

## 2021-04-02 NOTE — Procedures (Signed)
Routine EEG  EEG Performed Date 04/02/2021 Duration: 23:51 Clinical Indication: suspected seizures EEG Procedure: This is a digitally recorded routine EEG electroencephalogram. The international 10-20 electrode placement system is used for scalp electrode placement. Eighteen channels of scalp EEG are recorded. The data are stored digitally and reviewed in reformatted montages for optimal display. EEG Description: This EEG is not well organized.   There is no clear posterior dominant rhythm.  The background consisted of a mixture of delta and theta frequencies primarily.  During drowsiness, background slowing increased.  Stage 2 sleep was not recorded. There were no definite focal abnormalities, persistent asymmetries or epileptiform discharges.    Artifact:  Of note, there was significant EMG artifact related to patient's movement precluding full evaluation of underlying EEG rhythms.  Activating procedures:  Photic stimulation was not performed.  Hyperventilation was not performed.  EKG: The EKG rhythm strip demonstrated a NSR at 90 bpm. EEG Classification: Abnormal  EEG Interpretation: Background slowing, generalized This routine EEG recorded in the awake and drowsy states is abnormal.  The severe background slowing suggest severe diffuse cerebral dysfunction and/or sedative effect.  Please correlate clinically.  There were no definite focal abnormalities, persistent asymmetries or epileptiform discharges.

## 2021-04-02 NOTE — Progress Notes (Signed)
Initial Nutrition Assessment  DOCUMENTATION CODES:  Severe malnutrition in context of chronic illness  INTERVENTION:  Pending GOC meeting, RD to follow up to determine appropriate nutrition intervention.  NUTRITION DIAGNOSIS:  Severe Malnutrition related to chronic illness (several chronic wounds) as evidenced by severe fat depletion, severe muscle depletion.  GOAL:  Patient will meet greater than or equal to 90% of their needs  MONITOR:  Diet advancement, Labs, Weight trends, Skin, I & O's, Other (Comment) (GOC)  REASON FOR ASSESSMENT:  Consult Wound healing  ASSESSMENT:  86 yo male with a PMH of HLD, lewy body dementia, T2DM, and sacral pressure injury. Patient presented secondary to altered mental status and shortness of breath found to have evidence of sepsis secondary to a sacral decubitus ulcer.  Pt asleep during RD visit. Unable to awaken with touch or voice.  No family present at bedside.  Pt seen last month by another RD.  Wound care recommending hospice, as pt is poor candidate for surgical I&D.  Per Epic, pt has lost ~20.5 lbs (11.4%) in the past 3 weeks, which is significant and severe for the time frame.  Currently, pt NPO. Awaiting [place] meeting with family to determine hospital course.  Of note, pt with mild BLE edema. This may be masking further loss.  Medications: reviewed; D5 @ 75 ml/hr, IV ABX x3  Labs: reviewed; Na 152 (H), K 3 (L), CBG 91-135 HbA1c: 5.7% (02/27/2021)  NUTRITION - FOCUSED PHYSICAL EXAM: Flowsheet Row Most Recent Value  Orbital Region Severe depletion  Upper Arm Region Moderate depletion  Thoracic and Lumbar Region Severe depletion  Buccal Region Severe depletion  Temple Region Severe depletion  Clavicle Bone Region Severe depletion  Clavicle and Acromion Bone Region Severe depletion  Scapular Bone Region Unable to assess  Dorsal Hand Moderate depletion  Patellar Region Moderate depletion  Anterior Thigh Region Moderate  depletion  Posterior Calf Region Moderate depletion  Edema (RD Assessment) Mild  Hair Reviewed  Eyes Unable to assess  Mouth Reviewed  Skin Reviewed  Nails Reviewed   Diet Order:   Diet Order             Diet NPO time specified  Diet effective now                  EDUCATION NEEDS:  Not appropriate for education at this time  Skin:  Skin Assessment: Skin Integrity Issues: Skin Integrity Issues:: DTI, Stage II, Unstageable, Other (Comment) DTI: R anus, L & R elbows, L & R heels Stage II: L pretibial Unstageable: L knee, sacrum Other: R ankle pressure injury (not staged)  Last BM:  unknown  Height:  Ht Readings from Last 1 Encounters:  03/30/2021 6\' 1"  (1.854 m)   Weight:  Wt Readings from Last 1 Encounters:  03/15/2021 71.9 kg   BMI:  Body mass index is 20.92 kg/m.  Estimated Nutritional Needs:  Kcal:  2050-2250 Protein:  105-120 grams Fluid:  >2 L  Derrel Nip, RD, LDN (she/her/hers) Clinical Inpatient Dietitian RD Pager/After-Hours/Weekend Pager # in [place]

## 2021-04-02 NOTE — Progress Notes (Signed)
Spoke with wife on the phone and relayed updates and discussed conservative management is best approach for the patient in his condition.  We also discussed that this wound will likely never heal based on his nutritional status as well as his immobility.  She stated she understood and agreed with conservative management.  Erik Lara 3:11 PM 04/02/2021

## 2021-04-02 NOTE — Progress Notes (Signed)
Patient ID: Erik Lara, male   DOB: 1935/12/22, 86 y.o.   MRN: [id number] [facility] Surgery Progress Note     Subjective: CC-  Seen with PT.  Doesn't talk  Objective: Vital signs in last 24 hours: Temp:  [98 F (36.7 C)-99.6 F (37.6 C)] 99.6 F (37.6 C) (01/20 1430) Pulse Rate:  [92-106] 92 (01/20 1430) Resp:  [18-20] 18 (01/20 1430) BP: (96-134)/(58-115) 105/58 (01/20 1430) SpO2:  [91 %-100 %] 91 % (01/20 1430) Last BM Date:  (Unknown)  Intake/Output from previous day: 01/19 0701 - 01/20 0700 In: 1626 [I.V.:1026; IV Piggyback:600] Out: 1250 [Urine:1250] Intake/Output this shift: No intake/output data recorded.  PE: Sacral wound:  1.  Excisional vs non-excisional. Excisional  2.  Tool used for debridement (curette, scapel, etc.)  scalpel and scissors  3.  Frequency of surgical debridement.   once  4.  Measurement of total devitalized tissue (wound surface) before and after surgical debridement.    Before: 10x10cm After: 14x10x1.5cm  5.  Area and depth of devitalized tissue removed from wound.  30cm^2 removed Depth 1.5cm  6.  Blood loss and description of tissue removed.  No EBL, all tissue removed with necrotic  7.  Evidence of the progress of the wound's response to treatment.  A.  Current wound volume (current dimensions and depth).  210cm^3  B.  Presence (and extent of) of infection.  Yes  C.  Presence (and extent of) of non viable tissue.  Yes  D.  Other material in the wound that is expected to inhibit healing.  Yes   8.  Was there any viable tissue removed (measurements): No    Lab Results:  Recent Labs    04/06/2021 2216 04/02/21 0330  WBC 27.4* 30.6*  HGB 11.4* 10.8*  HCT 36.4* 34.9*  PLT 229 219   BMET Recent Labs    04/01/21 0333 04/02/21 0330  NA 151* 152*  K 3.8 3.0*  CL 116* 123*  CO2 23 21*  GLUCOSE 107* 150*  BUN 71* 60*  CREATININE 1.35* 1.21  CALCIUM 9.2 8.7*   PT/INR Recent Labs    03/30/2021 0840  04/01/21 0333  LABPROT 15.7* 17.4*  INR 1.3* 1.4*   CMP     Component Value Date/Time   NA 152 (H) 04/02/2021 0330   K 3.0 (L) 04/02/2021 0330   CL 123 (H) 04/02/2021 0330   CO2 21 (L) 04/02/2021 0330   GLUCOSE 150 (H) 04/02/2021 0330   GLUCOSE 114 (H) 01/31/2006 1101   BUN 60 (H) 04/02/2021 0330   CREATININE 1.21 04/02/2021 0330   CALCIUM 8.7 (L) 04/02/2021 0330   PROT 5.4 (L) 04/01/2021 0333   ALBUMIN 2.0 (L) 04/01/2021 0333   AST 25 04/01/2021 0333   ALT 31 04/01/2021 0333   ALKPHOS 78 04/01/2021 0333   BILITOT 1.0 04/01/2021 0333   GFRNONAA 59 (L) 04/02/2021 0330   GFRAA 123 02/05/2008 0846   Lipase  No results found for: LIPASE     Studies/Results: CT CHEST WO CONTRAST  Result Date: 03/16/2021 CLINICAL DATA:  Possible sepsis.  Respiratory illness. EXAM: CT CHEST WITHOUT CONTRAST TECHNIQUE: Multidetector CT imaging of the chest was performed following the standard protocol without IV contrast. RADIATION DOSE REDUCTION: This exam was performed according to the departmental dose-optimization program which includes automated exposure control, adjustment of the mA and/or kV according to patient size and/or use of iterative reconstruction technique. COMPARISON:  Abdomen CT 02/22/2007. FINDINGS: Cardiovascular: The heart size is normal. No  substantial pericardial effusion. Coronary artery calcification is evident. Mild atherosclerotic calcification is noted in the wall of the thoracic aorta. Mediastinum/Nodes: No mediastinal lymphadenopathy. No evidence for gross hilar lymphadenopathy although assessment is limited by the lack of intravenous contrast on the current study. The esophagus has normal imaging features. 2.6 cm exophytic nodule noted posterior left thyroid gland. There is no axillary lymphadenopathy. Lungs/Pleura: Patchy airspace disease in the posterior right lower lobe is compatible with pneumonia. There is some dependent atelectasis in both lower lungs. No pulmonary  edema or substantial pleural effusion. Upper Abdomen: Innumerable large hepatic cysts are again noted, some with mural calcification. Findings are not substantially progressive when comparing to the remote abdomen CT. Left renal cysts are evident. Musculoskeletal: No worrisome lytic or sclerotic osseous abnormality. Sclerotic lesion in the T10 vertebral body is new in the interval since 2008, but likely benign. IMPRESSION: 1. Patchy airspace disease in the posterior right lower lobe compatible with pneumonia. Consider follow-up CT chest in 3 months to ensure resolution. 2. 2.6 cm exophytic nodule posterior left thyroid gland. This has been evaluated on previous imaging. (ref: J Am Coll Radiol. 2015 Feb;12(2): 143-50). 3. Hepatic and left renal cysts. 4. Aortic Atherosclerosis (ICD10-I70.0). Electronically Signed   By: Misty Stanley M.D.   On: 03/27/2021 15:07   CT PELVIS WO CONTRAST  Result Date: 03/21/2021 CLINICAL DATA:  Sacral decubitus ulcer. Osteomyelitis suspected. Possible sepsis. EXAM: CT PELVIS WITHOUT CONTRAST TECHNIQUE: Multidetector CT imaging of the pelvis was performed following the standard protocol without intravenous contrast. RADIATION DOSE REDUCTION: This exam was performed according to the departmental dose-optimization program which includes automated exposure control, adjustment of the mA and/or kV according to patient size and/or use of iterative reconstruction technique. COMPARISON:  Limited correlation made with one view abdomen 03/05/2021 and abdominal CT 03/05/2021. FINDINGS: Urinary Tract: The visualized distal ureters appear unremarkable. A Foley catheter is in place with air in the bladder lumen. There is generalized bladder wall thickening. Multiple small dependent calculi are present within the bladder lumen. Bowel: No bowel wall thickening, distention or surrounding inflammation identified within the pelvis. Mildly prominent rectal stool. Vascular/Lymphatic: No enlarged pelvic  lymph nodes identified. Mild iliofemoral atherosclerosis. Reproductive: The prostate gland and seminal vesicles appear unremarkable. Other: No intrapelvic fluid collection or free air identified. Musculoskeletal: There is soft tissue ulceration along the inferior aspect of the intergluteal fold, with soft tissue emphysema extending inferior to the coccyx, asymmetric to the left. There are inflammatory changes within the surrounding subcutaneous fat, although no foreign body is identified. There is no definite bone destruction to suggest osteomyelitis. There are mild degenerative changes of the hips and sacroiliac joints bilaterally. No significant joint effusions. Moderate facet arthropathy noted in the lower lumbar spine. IMPRESSION: 1. Soft tissue decubitus ulceration over the distal sacrum and coccyx with associated soft tissue emphysema suspicious for soft tissue infection. No drainable abscess identified. 2. No evidence of pelvic osteomyelitis. 3. Degenerative changes in the lower lumbar spine, sacroiliac joints and hips without significant joint effusions. 4. Multiple small bladder calculi with nonspecific bladder wall thickening. Foley catheter in place. Electronically Signed   By: Richardean Sale M.D.   On: 03/27/2021 15:13   US RENAL  Result Date: 04/01/2021 CLINICAL DATA:  AKI. EXAM: RENAL / URINARY TRACT ULTRASOUND COMPLETE COMPARISON:  02/22/2007. FINDINGS: Right Kidney: Renal measurements: 13.7 x 5.8 x 7.3 cm = volume: 503.6 mL. Echogenicity within normal limits. No renal calculus. There is mild hydronephrosis. Left Kidney: Renal measurements: 12.5 x  5.9 x 4.9 cm = volume: 188.8 mL. Echogenicity within normal limits. There is a cyst in the midpole measuring 2.3 x 1.8 x 1.8 cm with a thin rim calcification. A cyst is present in the upper pole measuring 2.1 x 2.2 x 2.3 cm. No renal calculi. Moderate hydronephrosis is noted. Bladder: The bladder is nondistended and a Foley catheter is in place. Other:  Limited evaluation due to patient's body habitus and immobility. Incidental note of multiple cysts in the liver. IMPRESSION: 1. Moderate hydronephrosis on the left and mild hydronephrosis on the right. 2. Left renal cysts, 1 with a thin rim calcification. 3. Foley catheter in the urinary bladder. Electronically Signed   By: Brett Fairy M.D.   On: 04/01/2021 02:11    Anti-infectives: Anti-infectives (From admission, onward)    Start     Dose/Rate Route Frequency Ordered Stop   04/03/21 1000  vancomycin (VANCOREADY) IVPB 1250 mg/250 mL        1,250 mg 166.7 mL/hr over 90 Minutes Intravenous Every 24 hours 04/02/21 1204     04/02/21 1200  vancomycin (VANCOREADY) IVPB 1500 mg/300 mL  Status:  Discontinued        1,500 mg 150 mL/hr over 120 Minutes Intravenous Every 48 hours 03/27/2021 1308 04/01/21 0920   04/01/21 1200  vancomycin (VANCOREADY) IVPB 1000 mg/200 mL  Status:  Discontinued        1,000 mg 200 mL/hr over 60 Minutes Intravenous Daily 04/01/21 0920 04/02/21 1204   04/01/21 1000  ceFEPIme (MAXIPIME) 2 g in sodium chloride 0.9 % 100 mL IVPB        2 g 200 mL/hr over 30 Minutes Intravenous Every 12 hours 04/01/21 0924     03/29/2021 1400  ceFEPIme (MAXIPIME) 2 g in sodium chloride 0.9 % 100 mL IVPB  Status:  Discontinued        2 g 200 mL/hr over 30 Minutes Intravenous Every 24 hours 03/17/2021 1305 04/01/21 0924   03/16/2021 1400  metroNIDAZOLE (FLAGYL) IVPB 500 mg        500 mg 100 mL/hr over 60 Minutes Intravenous Every 12 hours 03/30/2021 1305     03/26/2021 1130  vancomycin (VANCOREADY) IVPB 1500 mg/300 mL        1,500 mg 150 mL/hr over 120 Minutes Intravenous  Once 04/01/2021 1124 03/29/2021 1432   04/11/2021 1115  cefTRIAXone (ROCEPHIN) 2 g in sodium chloride 0.9 % 100 mL IVPB        2 g 200 mL/hr over 30 Minutes Intravenous  Once 03/14/2021 1107 03/30/2021 1204        Assessment/Plan Necrotic sacral decubitus ulcer, unstagable - no bone present currently, but some necrotic tissue derided  and a pocket of brown purulent drainage encountered at 7 oclock.  There is a tunnel that tracks about 5cm caudal towards his rectum, but does not appear to involve his rectum.  This was evacuated and then hydrotherapy redid his session to help clean this up.   -patient is frail and this wound is extensive.  Would not recommend any aggressive measures for this patient such as debridement in the OR. -we can continue conservative management at the bedside with santyl, hydrotherapy, and intermittent bedside debridement if warranted.   -will try to contact wife today to discuss today's findings. -cont abx therapy -can have a diet from our standpoint if he is able to eat. -message sent to primary team to update him of new findings.   Sepsis  Acute encephalopathy Bilateral hydronephrosis L>R  COVID-19 - admitted 12/16 due to this, received IV remdesivir 12/21-12/23 Diabetes Mellitus, type II  HLD Lewy body dementia Hyperthyroidism PMH a.fib with RVR in the setting of acute illness (COVID) - not currently on amiodarone or anticoagulation. Physical deconditioning    FEN - NPO, IVF VTE - SCD's, heparin ID - Maxipime/ vancomycin/ flagyl   Moderate Medical Decision Making   LOS: 2 days    Henreitta Cea, [facility] Surgery 04/02/2021, 2:33 PM Please see Amion for pager number during day hours 7:00am-4:30pm

## 2021-04-02 NOTE — Progress Notes (Signed)
Physical Therapy Wound Treatment Patient Details  Name: Erik Lara MRN: [id number] Date of Birth: 1935/05/06  Today's Date: 04/02/2021 Time: 1315-1410 Time Calculation (min): 55 min  Subjective  Subjective Assessment Subjective: Pt nonverbal, no grimacing throughout procedure, no signs of discomfort. Patient and Family Stated Goals: none stated, wound tissue to become healthier. Date of Onset:  (unknown) Prior Treatments: none known  Pain Score:    Wound Assessment  Pressure Injury 04/01/21 Sacrum Posterior Unstageable - Full thickness tissue loss in which the base of the injury is covered by slough (yellow, tan, gray, green or brown) and/or eschar (tan, brown or black) in the wound bed. PT HYDRO Only (Active)  Wound Image    04/02/21 1400  Dressing Type Gauze (Comment);Moist to dry;Santyl;Silicone dressing 40/98/11 1400  Dressing Changed 04/02/21 1400  Dressing Change Frequency Daily 04/02/21 1400  State of Healing Non-healing 04/02/21 1400  Site / Wound Assessment Black;Owens [place]ark;Purple;Red 04/02/21 1400  % Wound base Yellow/Fibrinous Exudate 5% 04/02/21 1400  % Wound base Black/Eschar 90% 04/02/21 1400  % Wound base Other/Granulation Tissue (Comment) 5% 04/02/21 1400  Peri-wound Assessment Maceration;Erythema (blanchable);Purple 04/02/21 1400  Wound Length (cm) 10.4 cm 04/01/21 1500  Wound Width (cm) 10 cm 04/01/21 1500  Wound Surface Area (cm^2) 104 cm^2 04/01/21 1500  Margins Unattached edges (unapproximated) 04/02/21 1400  Drainage Amount Minimal 04/02/21 1400  Drainage Description Odor;Purulent 04/02/21 1400  Treatment Cleansed;Debridement (Selective);Hydrotherapy (Pulse lavage);Packing (Saline gauze) 04/02/21 1400      Hydrotherapy Pulsed lavage therapy - wound location: sacral ulcer Pulsed Lavage with Suction (psi): 8 psi Pulsed Lavage with Suction - Normal Saline Used: 2000 mL Pulsed Lavage Tip: Tip with splash shield Selective Debridement Selective Debridement -  Location: sacral ulcer Selective Debridement - Tools Used: Forceps, Scissors, Scalpel Selective Debridement - Tissue Removed: nectrotic tissue, devitalized tissue    Wound Assessment and Plan  Wound Therapy - Assess/Plan/Recommendations Wound Therapy - Clinical Statement: Patient's wound predominately unchanged since yesterday with no loosening/breakdown of eschar noted when dressing removed. Debrided margins and loosly adheared eschar at inferior portion of wound. Saverio Danker, PA-C from surgery present and successfully debrided portion of center of wound (6x5x1.5 cm) and opened tunneled area between 6-7 o'clock moving inferiorly. Tunnel drained purulent brown malodorous drainage, with massage/pressure applied to inferior Lt buttock more drainage able to be expressed. Wound cleansed with pulsed lavage and redressed with santyl and saline soaked gauze. Discussed recommendation to use Dakins solution to clean wound for next week with Claiborne Billings and will switch to that starting 1/21 for next week. Patient will continue to beenfit from hydrotherapy to promote clean wound environment to reduce bioburden and local infeciton risk with goal of wound maintenance. Wound Therapy - Functional Problem List: Decreased strength;Decreased mobility;Decreased cognition;Decreased range of motion;Impaired sensation;Decreased skin integrity; Factors Delaying/Impairing Wound Healing: Immobility, Multiple medical problems, Diabetes Mellitus, Infection - systemic/local Hydrotherapy Plan: Dressing change, Debridement, Pulsatile lavage with suction Wound Therapy - Frequency: 6X / week Wound Therapy - Current Recommendations: Surgery consult Wound Therapy - Follow Up Recommendations: f/u pulsed lavage with suction, f/u selective debridement, dressing changes by RN  Wound Therapy Goals- Improve the function of patient's integumentary system by progressing the wound(s) through the phases of wound healing (inflammation -  proliferation - remodeling) by: Wound Therapy Goals - Improve the function of patient's integumentary system by progressing the wound(s) through the phases of wound healing by: Decrease Necrotic Tissue to: 30% Decrease Necrotic Tissue - Progress: Progressing toward goal Increase Granulation Tissue to: 70% Increase  Granulation Tissue - Progress: Progressing toward goal Improve Drainage Characteristics: Min Improve Drainage Characteristics - Progress: Progressing toward goal Goals/treatment plan/discharge plan were made with and agreed upon by patient/family: No, Patient unable to participate in goals/treatment/discharge plan and family unavailable Time For Goal Achievement: 2 weeks Wound Therapy - Potential for Goals: Poor  Goals will be updated until maximal potential achieved or discharge criteria met.  Discharge criteria: when goals achieved, discharge from hospital, MD decision/surgical intervention, no progress towards goals, refusal/missing three consecutive treatments without notification or medical reason.  GP     Charges PT Wound Care Charges $Wound Debridement up to 20 cm: < or equal to 20 cm $PT Hydrotherapy Dressing: 1 dressing $PT PLS Gun and Tip: 1 Supply (2) $PT Hydrotherapy Visit: 1 Visit      Verner Mould, DPT Acute Rehabilitation Services Office [phone] Pager [phone]   Jacques Navy 04/02/2021, 2:43 PM

## 2021-04-02 NOTE — Assessment & Plan Note (Addendum)
While at SNF, patient was referred to hospice; this was apparently not known by family at the time and they have not accepted hospice care. Patient is currently unable to eat/drink secondary to severe encephalopathy. Patient also shows signs of dehydration complicated by poor albumin/third spacing.  1/23: Transition to full comfort measures.  2/1: Recommended to remain inpatient per palliative care for in-hospital death

## 2021-04-02 NOTE — Progress Notes (Signed)
EEG complete - results pending 

## 2021-04-02 NOTE — Progress Notes (Signed)
Pharmacy Antibiotic Note  Erik Lara is a 86 y.o. male with sacrum wound presented the ED on 03/30/2021 with c/o SOB. Pharmacy has been consulted to dose vancomycin, cefepime, and flagyl for wound infection.  Today, 04/02/2021: D3 abx Remains afebrile WBC and LA remain elevated/stable SCr improved significantly  Plan: Increase vancomycin to 1250 mg IV q24 (AUC 513 w/ SCr 1.21, Vd 0.72) with improved renal function SCr daily while on vanc Continue cefepime to 2g IV q12 hr Continue Flagyl 500 mg IV q12 hr  _____________________________________  Height: 6\' 1"  (185.4 cm) Weight: 71.9 kg (158 lb 9.6 oz) IBW/kg (Calculated) : 79.9  Temp (24hrs), Avg:98.6 F (37 C), Min:98 F (36.7 C), Max:98.9 F (37.2 C)  Recent Labs  Lab 03/20/2021 0840 04/06/2021 0849 04/10/2021 1146 04/08/2021 2216 04/01/21 0333 04/02/21 0330  WBC 26.3*  --   --  27.4*  --  30.6*  CREATININE 2.42*  --   --  1.54* 1.35* 1.21  LATICACIDVEN  --  3.5* 2.5* 2.6* 2.3*  --      Estimated Creatinine Clearance: 46.2 mL/min (by C-G formula based on SCr of 1.21 mg/dL).    No Known Allergies  Antimicrobials this admission: 1/18 CTX x1 1/18 vanc >> 1/18 cefepime >> 1/18 flagyl >>  Dose adjustments this admission: 1/19 chg vanc 1500 q48 >> 1g q24; cefepime 2q24 >> 2q12 w/ improved SCr 1/20 increase vancomycin to 1250mg  IV q24h for improved SCr  Microbiology results: 1/18 COVID+ 1/18 UCx: NGF 1/18 BCx: ngtd  Thank you for allowing pharmacy to be a part of this patients care.  Peggyann Juba, PharmD, BCPS Pharmacy: [phone] 04/02/2021 12:05 PM

## 2021-04-02 NOTE — Progress Notes (Signed)
PROGRESS NOTE    Erik Lara  ZLD:[id number] DOB: 1935/10/13 DOA: 03/30/2021 PCP: Dorothyann Peng, NP   Brief Narrative: Erik Lara is 86 y.o. male with a history of hyperlipidemia, lewy body dementia, diabetes mellitus, sacral pressure injury. Patient presented secondary to altered mental status and shortness of breath found to have evidence of sepsis secondary to a sacral decubitus ulcer. Preliminary blood cultures suggest GNR bacteremia. General surgery on board for debridement of decubitus ulcer   Assessment & Plan:   * Sepsis ([place])- (present on admission) Present on admission. Likely secondary to sacral decubitus ulcer. Procalcitonin of 0.31. Afebrile with leukocytosis. Antibiotics initiated on admission and blood cultures obtained. One set of blood cultures significant for GNRs. Leukocytosis worsened. -CBC in AM -Continue antibiotics, Decubitus ulcer of sacral region, unstageable ([place]) -Follow-up blood cultures  Decubitus ulcer of sacral region, unstageable [facility]) Concern that this is a source for infection/sepsis. General surgery consulted. CT pelvis significant for no abscess formation. Started empirically on Vancomycin, Cefepime and Flagyl -Continue Vancomycin/Cefepime/Flagyl -General surgery recommendations: conservative management. Bedside debridement and hydrotherapy  Hypernatremia Unsure of chronicity. Secondary to poor oral intake. Worsening -Increase to D5 water IV @ 100 mL/hr -Daily BMP -Daily weights  AKI (acute kidney injury) ([facility]) Creatinine of 2.42 on admission. Secondary to acute urinary retention in addition to decreased oral intake. Foley catheter placed and IV fluids given with improving creatinine.  Hydronephrosis Likely related to acute urinary retention. Left greater than right. Associated AKI on admission.  Pressure injury of skin- (present on admission) Left pretibial, left heel, right heel, medial sacrum, anterior/left ankle, anterior/left  knee, posterior/right elbow. Wound care consulted for wound management recommendations.  COVID-19 virus infection- (present on admission) Previously diagnosed on 02/26/2021. Currently asymptomatic. No treatment initiated this admission. Chest x-ray without acute process.  Lewy body dementia ([place])- (present on admission) Patient follows with neurology. -Hold Namenda and Aricept at this time  Type 2 diabetes mellitus without complication, without long-term current use of insulin ([place]) Well controlled. Last hemoglobin A1C of 5.7% from 12/22.  Hyperlipidemia- (present on admission) -Hold home Crestor  Goals of care, counseling/discussion While at SNF, patient was referred to hospice; this was apparently not known by family at the time and they have not accepted hospice care. Patient is currently unable to eat/drink secondary to severe encephalopathy. Patient also shows signs of dehydration complicated by poor albumin/third spacing.  Protein-calorie malnutrition, severe Dietitian consulted. Patient is currently NPO secondary to mental status.  Acute urinary retention Unknown etiology. Bladder scan in the ED was significant for 1200 mL of measured urine. Foley catheter placed. Urine culture with no growth. Associated left moderate and right mild hydronephrosis. -Continue foley catheter  Acute metabolic encephalopathy Possibly multifactorial in setting of underlying dementia, hypernatremia, acute infection. -Will obtain CT head and EEG today  Abnormal TSH- (present on admission) Recent TSH from 12/18 of 0.143 with free T4 of 1.39 (elevated) and T3 of 57 (low). Concerning for hyperthyroidism. -Repeat TSH, free T4, T3     DVT prophylaxis: Heparin subcu Code Status:   Code Status: DNR Family Communication: Wife and son on telephone Disposition Plan: Discharge pending continued treatment of sacral decubitus ulcer, continued goals of care   Consultants:  General surgery Palliative care  medicine  Procedures:  HYDROTHERAPY  Antimicrobials: Vancomycin Cefepime Flagyl    Subjective: Non-verbal  Objective: Vitals:   04/02/21 0113 04/02/21 0212 04/02/21 0604 04/02/21 1430  BP: 106/89 (!) 134/99 105/73 (!) 105/58  Pulse: 95 98 94  92  Resp: 20 20 19 18   Temp: 98 F (36.7 C) 98.9 F (37.2 C) 98.9 F (37.2 C) 99.6 F (37.6 C)  TempSrc: Axillary Axillary Oral Oral  SpO2: 94% 98% 100% 91%  Weight:      Height:        Intake/Output Summary (Last 24 hours) at 04/02/2021 1614 Last data filed at 04/02/2021 1440 Gross per 24 hour  Intake 2134.24 ml  Output 1250 ml  Net 884.24 ml    Filed Weights   03/27/2021 0935 03/21/2021 2217  Weight: 81.4 kg 71.9 kg    Examination:  General exam: Appears calm and comfortable Respiratory system: Clear to auscultation. Respiratory effort normal. Cardiovascular system: S1 & S2 heard, RRR. No murmurs, rubs, gallops or clicks. Gastrointestinal system: Abdomen is nondistended, soft and nontender. No organomegaly or masses felt. Normal bowel sounds heard. Central nervous system: Eyes are open but unsure if alert. Grimaces to pain only. Does not follow any commands. Does not blink to threat. Musculoskeletal: Bilateral lower feet edema. No calf tenderness Skin: No cyanosis.    Data Reviewed: I have personally reviewed following labs and imaging studies  CBC Lab Results  Component Value Date   WBC 30.6 (H) 04/02/2021   RBC 3.62 (L) 04/02/2021   HGB 10.8 (L) 04/02/2021   HCT 34.9 (L) 04/02/2021   MCV 96.4 04/02/2021   MCH 29.8 04/02/2021   PLT 219 04/02/2021   MCHC 30.9 04/02/2021   RDW 14.0 04/02/2021   LYMPHSABS 0.5 (L) 03/23/2021   MONOABS 0.7 03/14/2021   EOSABS 0.0 03/18/2021   BASOSABS 0.1 35/57/3220     Last metabolic panel Lab Results  Component Value Date   NA 152 (H) 04/02/2021   K 3.0 (L) 04/02/2021   CL 123 (H) 04/02/2021   CO2 21 (L) 04/02/2021   BUN 60 (H) 04/02/2021   CREATININE 1.21 04/02/2021    GLUCOSE 150 (H) 04/02/2021   GFRNONAA 59 (L) 04/02/2021   GFRAA 123 02/05/2008   CALCIUM 8.7 (L) 04/02/2021   PHOS 3.1 03/03/2021   PROT 5.4 (L) 04/01/2021   ALBUMIN 2.0 (L) 04/01/2021   BILITOT 1.0 04/01/2021   ALKPHOS 78 04/01/2021   AST 25 04/01/2021   ALT 31 04/01/2021   ANIONGAP 8 04/02/2021    CBG (last 3)  Recent Labs    04/01/21 2354 04/02/21 0631 04/02/21 1201  GLUCAP 115* 135* 144*      GFR: Estimated Creatinine Clearance: 46.2 mL/min (by C-G formula based on SCr of 1.21 mg/dL).  Coagulation Profile: Recent Labs  Lab 03/19/2021 0840 04/01/21 0333  INR 1.3* 1.4*     Recent Results (from the past 240 hour(s))  Blood Culture (routine x 2)     Status: None (Preliminary result)   Collection Time: 03/16/2021  8:40 AM   Specimen: BLOOD  Result Value Ref Range Status   Specimen Description   Final    BLOOD RIGHT ARM Performed at [facility] [address] [place], [place] [address]    Special Requests   Final    BOTTLES DRAWN AEROBIC AND ANAEROBIC Blood Culture adequate volume Performed at [facility] [address], [place], [place] [address]    Culture  Setup Time   Final    GRAM NEGATIVE RODS ANAEROBIC BOTTLE ONLY CRITICAL RESULT CALLED TO, READ BACK BY AND VERIFIED WITH: Latina Craver [phone] FCP Performed at [facility], [place] [address] [place], [place] [address]  Culture GRAM NEGATIVE RODS  Final   Report Status PENDING  Incomplete  Blood Culture ID Panel (Reflexed)     Status: None   Collection Time: 04/11/2021  8:40 AM  Result Value Ref Range Status   Enterococcus faecalis NOT DETECTED NOT DETECTED Final   Enterococcus Faecium NOT DETECTED NOT DETECTED Final   Listeria monocytogenes NOT DETECTED NOT DETECTED Final   Staphylococcus species NOT DETECTED NOT DETECTED Final   Staphylococcus aureus (BCID) NOT DETECTED NOT DETECTED Final   Staphylococcus epidermidis NOT DETECTED NOT DETECTED  Final   Staphylococcus lugdunensis NOT DETECTED NOT DETECTED Final   Streptococcus species NOT DETECTED NOT DETECTED Final   Streptococcus agalactiae NOT DETECTED NOT DETECTED Final   Streptococcus pneumoniae NOT DETECTED NOT DETECTED Final   Streptococcus pyogenes NOT DETECTED NOT DETECTED Final   A.calcoaceticus-baumannii NOT DETECTED NOT DETECTED Final   Bacteroides fragilis NOT DETECTED NOT DETECTED Final   Enterobacterales NOT DETECTED NOT DETECTED Final   Enterobacter cloacae complex NOT DETECTED NOT DETECTED Final   Escherichia coli NOT DETECTED NOT DETECTED Final   Klebsiella aerogenes NOT DETECTED NOT DETECTED Final   Klebsiella oxytoca NOT DETECTED NOT DETECTED Final   Klebsiella pneumoniae NOT DETECTED NOT DETECTED Final   Proteus species NOT DETECTED NOT DETECTED Final   Salmonella species NOT DETECTED NOT DETECTED Final   Serratia marcescens NOT DETECTED NOT DETECTED Final   Haemophilus influenzae NOT DETECTED NOT DETECTED Final   Neisseria meningitidis NOT DETECTED NOT DETECTED Final   Pseudomonas aeruginosa NOT DETECTED NOT DETECTED Final   Stenotrophomonas maltophilia NOT DETECTED NOT DETECTED Final   Candida albicans NOT DETECTED NOT DETECTED Final   Candida auris NOT DETECTED NOT DETECTED Final   Candida glabrata NOT DETECTED NOT DETECTED Final   Candida krusei NOT DETECTED NOT DETECTED Final   Candida parapsilosis NOT DETECTED NOT DETECTED Final   Candida tropicalis NOT DETECTED NOT DETECTED Final   Cryptococcus neoformans/gattii NOT DETECTED NOT DETECTED Final    Comment: Performed at [facility], [address] [place], [place] [address]  Urine Culture     Status: None   Collection Time: 03/21/2021  8:47 AM   Specimen: In/Out Cath Urine  Result Value Ref Range Status   Specimen Description   Final    IN/OUT CATH URINE Performed at [facility] [address] [place], [place] [address]    Special Requests   Final    NONE Performed  at [facility], [place] [address] [place], [place] [address]    Culture   Final    NO GROWTH Performed at [facility], [place] [address], [place], [place] [address]    Report Status 04/01/2021 FINAL  Final  Resp Panel by RT-PCR (Flu A&B, Covid) Nasopharyngeal Swab     Status: Abnormal   Collection Time: 04/10/2021  8:49 AM   Specimen: Nasopharyngeal Swab; Nasopharyngeal(NP) swabs in vial transport medium  Result Value Ref Range Status   SARS Coronavirus 2 by RT PCR POSITIVE (A) NEGATIVE Final    Comment: (NOTE) SARS-CoV-2 target nucleic acids are DETECTED.  The SARS-CoV-2 RNA is generally detectable in upper respiratory specimens during the acute phase of infection. Positive results are indicative of the presence of the identified virus, but do not rule out bacterial infection or co-infection with other pathogens not detected by the test. Clinical correlation with patient history and other diagnostic information is necessary to determine patient infection status. The expected result is Negative.  Fact Sheet for Patients: EntrepreneurPulse.com.au  Fact Sheet for Healthcare Providers: IncredibleEmployment.be  This test is not yet approved or cleared by the [place] FDA and  has been authorized for detection and/or diagnosis of SARS-CoV-2 by FDA under an Emergency Use Authorization (EUA).  This EUA will remain in effect (meaning this test can be used) for the duration of  the COVID-19 declaration under Section 564(b)(1) of the A ct, 21 U.S.C. section 360bbb-3(b)(1), unless the authorization is terminated or revoked sooner.     Influenza A by PCR NEGATIVE NEGATIVE Final   Influenza B by PCR NEGATIVE NEGATIVE Final    Comment: (NOTE) The Xpert Xpress SARS-CoV-2/FLU/RSV plus assay is intended as an aid in the diagnosis of influenza from Nasopharyngeal swab specimens and should not be used as a sole basis for treatment.  Nasal washings and aspirates are unacceptable for Xpert Xpress SARS-CoV-2/FLU/RSV testing.  Fact Sheet for Patients: EntrepreneurPulse.com.au  Fact Sheet for Healthcare Providers: IncredibleEmployment.be  This test is not yet approved or cleared by the [place] FDA and has been authorized for detection and/or diagnosis of SARS-CoV-2 by FDA under an Emergency Use Authorization (EUA). This EUA will remain in effect (meaning this test can be used) for the duration of the COVID-19 declaration under Section 564(b)(1) of the Act, 21 U.S.C. section 360bbb-3(b)(1), unless the authorization is terminated or revoked.  Performed at [facility], [place] [address] [place], [place] [address]   Blood Culture (routine x 2)     Status: None (Preliminary result)   Collection Time: 04/02/2021 11:44 AM   Specimen: BLOOD LEFT HAND  Result Value Ref Range Status   Specimen Description   Final    BLOOD LEFT HAND Performed at [facility] [address], [place], [place] [address]    Special Requests   Final    BOTTLES DRAWN AEROBIC AND ANAEROBIC Blood Culture results may not be optimal due to an inadequate volume of blood received in culture bottles Performed at [facility] [address] [place], [place] [address]    Culture   Final    NO GROWTH 2 DAYS Performed at [facility] [address], [place], [place] [address]    Report Status PENDING  Incomplete  MRSA Next Gen by PCR, Nasal     Status: None   Collection Time: 04/01/21 11:11 AM   Specimen: Nasal Mucosa; Nasal Swab  Result Value Ref Range Status   MRSA by PCR Next Gen NOT DETECTED NOT DETECTED Final    Comment: (NOTE) The GeneXpert MRSA Assay (FDA approved for NASAL specimens only), is one component of a comprehensive MRSA colonization surveillance program. It is not intended to diagnose MRSA infection nor to guide or monitor  treatment for MRSA infections. Test performance is not FDA approved in patients less than 83 years old. Performed at [facility], [place] [address], [place], [place] [address]          Radiology Studies: US RENAL  Result Date: 04/01/2021 CLINICAL DATA:  AKI. EXAM: RENAL / URINARY TRACT ULTRASOUND COMPLETE COMPARISON:  02/22/2007. FINDINGS: Right Kidney: Renal measurements: 13.7 x 5.8 x 7.3 cm = volume: 503.6 mL. Echogenicity within normal limits. No renal calculus. There is mild hydronephrosis. Left Kidney: Renal measurements: 12.5 x 5.9 x 4.9 cm = volume: 188.8 mL. Echogenicity within normal limits. There is a cyst in the midpole measuring 2.3 x 1.8 x 1.8 cm with a thin rim calcification. A cyst  is present in the upper pole measuring 2.1 x 2.2 x 2.3 cm. No renal calculi. Moderate hydronephrosis is noted. Bladder: The bladder is nondistended and a Foley catheter is in place. Other: Limited evaluation due to patient's body habitus and immobility. Incidental note of multiple cysts in the liver. IMPRESSION: 1. Moderate hydronephrosis on the left and mild hydronephrosis on the right. 2. Left renal cysts, 1 with a thin rim calcification. 3. Foley catheter in the urinary bladder. Electronically Signed   By: Brett Fairy M.D.   On: 04/01/2021 02:11        Scheduled Meds:  Chlorhexidine Gluconate Cloth  6 each Topical Daily   collagenase   Topical Daily   heparin  5,000 Units Subcutaneous Q8H   sodium hypochlorite   Irrigation BID   Continuous Infusions:  ceFEPime (MAXIPIME) IV 2 g (04/02/21 0929)   dextrose 75 mL/hr at 04/02/21 0415   metronidazole 500 mg (04/02/21 1440)   potassium chloride     [START ON 04/03/2021] vancomycin       LOS: 2 days     Cordelia Poche, MD [facility] 04/02/2021, 4:14 PM  If 7PM-7AM, please contact night-coverage [email or web address]

## 2021-04-02 NOTE — Progress Notes (Signed)
There is a scab on the patient's scalp at the Cz position.  Cz electrode placed 1cm lateral to its normal position for routine EEG study.

## 2021-04-03 DIAGNOSIS — Z7189 Other specified counseling: Secondary | ICD-10-CM | POA: Diagnosis not present

## 2021-04-03 DIAGNOSIS — A419 Sepsis, unspecified organism: Secondary | ICD-10-CM | POA: Diagnosis not present

## 2021-04-03 DIAGNOSIS — G3183 Dementia with Lewy bodies: Secondary | ICD-10-CM | POA: Diagnosis not present

## 2021-04-03 DIAGNOSIS — R7989 Other specified abnormal findings of blood chemistry: Secondary | ICD-10-CM | POA: Diagnosis not present

## 2021-04-03 DIAGNOSIS — R338 Other retention of urine: Secondary | ICD-10-CM | POA: Diagnosis not present

## 2021-04-03 DIAGNOSIS — G9341 Metabolic encephalopathy: Secondary | ICD-10-CM | POA: Diagnosis not present

## 2021-04-03 DIAGNOSIS — F0280 Dementia in other diseases classified elsewhere without behavioral disturbance: Secondary | ICD-10-CM | POA: Diagnosis not present

## 2021-04-03 LAB — CBC
HCT: 35.5 % — ABNORMAL LOW (ref 39.0–52.0)
Hemoglobin: 10.6 g/dL — ABNORMAL LOW (ref 13.0–17.0)
MCH: 29.3 pg (ref 26.0–34.0)
MCHC: 29.9 g/dL — ABNORMAL LOW (ref 30.0–36.0)
MCV: 98.1 fL (ref 80.0–100.0)
Platelets: 195 10*3/uL (ref 150–400)
RBC: 3.62 MIL/uL — ABNORMAL LOW (ref 4.22–5.81)
RDW: 14.2 % (ref 11.5–15.5)
WBC: 39.8 10*3/uL — ABNORMAL HIGH (ref 4.0–10.5)
nRBC: 0 % (ref 0.0–0.2)

## 2021-04-03 LAB — BASIC METABOLIC PANEL
Anion gap: 7 (ref 5–15)
BUN: 45 mg/dL — ABNORMAL HIGH (ref 8–23)
CO2: 21 mmol/L — ABNORMAL LOW (ref 22–32)
Calcium: 8.7 mg/dL — ABNORMAL LOW (ref 8.9–10.3)
Chloride: 122 mmol/L — ABNORMAL HIGH (ref 98–111)
Creatinine, Ser: 1.01 mg/dL (ref 0.61–1.24)
GFR, Estimated: >60 mL/min (ref >60)
Glucose, Bld: 159 mg/dL — ABNORMAL HIGH (ref 70–99)
Potassium: 3.4 mmol/L — ABNORMAL LOW (ref 3.5–5.1)
Sodium: 150 mmol/L — ABNORMAL HIGH (ref 135–145)

## 2021-04-03 LAB — GLUCOSE, CAPILLARY
Glucose-Capillary: 143 mg/dL — ABNORMAL HIGH (ref 70–99)
Glucose-Capillary: 131 mg/dL — ABNORMAL HIGH (ref 70–99)

## 2021-04-03 LAB — PROCALCITONIN: Procalcitonin: 0.72 ng/mL

## 2021-04-03 MED ORDER — SODIUM CHLORIDE 0.9 % IV SOLN
1.0000 g | Freq: Three times a day (TID) | INTRAVENOUS | Status: DC
  Administered 2021-04-03 – 2021-04-05 (×7): 1 g via INTRAVENOUS
  Filled 2021-04-03 (×8): qty 1

## 2021-04-03 MED ORDER — MORPHINE SULFATE (PF) 2 MG/ML IV SOLN
2.0000 mg | INTRAVENOUS | Status: DC | PRN
  Administered 2021-04-04 – 2021-04-08 (×5): 2 mg via INTRAVENOUS
  Filled 2021-04-03 (×5): qty 1

## 2021-04-03 NOTE — Consult Note (Signed)
Palliative Care Consultation Note  Erik Lara is an 86 yo man with Lewy Body Dementia, DM2, Atrial Fibrillation and recent admission 12.16 with Covid-19 infection. Patient was discharged to [facility] for rehabilitation due to deconditioning following hospitalization on 12/30. He was admitted back to the hospital on 1/18 with hypotension, fever and AMS felt to be related to infection of a large necrotic sacral decubitus ulcer. Patient had been referred to hospice on 1/18 from his SNF, but was not admitted to service because son was waiting to speak with provider at [facility]. Patients wife has memory issues and was not able to provide details to her son who lives out of town.  I spoke with patients son at length re his dads current condition, prognosis and anticipated trajectory of illness. I made a recommendation for comfort care based on the fact that there is no reversible disease process and treatment of this wound would be extremely painful and prolonged without the ability to heal..his dad would never survive multiple surgeries, diverting colostomy and all of the debridement etc...  Son understands the serious condition and that his father is in fact appropriate for hospice care. He asks what that process would look like.    Plan: DNR Son requests that he speak with his mother before transitioning to full comfort. He is coming to [employer]. Provide comfort measures in addition to abx for now-hold hydrotherapy until son arrives. Patient may not survive hospitalization.  Lane Hacker, DO Palliative Medicine  Time:65 min

## 2021-04-03 NOTE — Progress Notes (Addendum)
PT HYDROTHERAPY WOUND TREATMENT  Patient Details  Name: Erik Lara MRN: [id number] Date of Birth: 01-14-1936   Today's Date: 04/03/20 Time: 2637-8588 Time Calculation (min): 31   Subjective  Subjective Assessment Subjective: Pt nonverbal, no grimacing throughout procedure, no signs of discomfort. Patient and Family Stated Goals: none stated, wound tissue to become healthier. Date of Onset:  (unknown) Prior Treatments: none known  Pain Score:     Wound Assessment     Pressure Injury 04/01/21 Sacrum Posterior Unstageable - Full thickness tissue loss in which the base of the injury is covered by slough (yellow, tan, gray, green or brown) and/or eschar (tan, brown or black) in the wound bed. PT HYDRO Only (Active)  Wound Image   re-do pics 04/05/21  Dressing Type Gauze (Comment);Moist to dry;Santyl; dakins;Silicone dressing  Dressing Changed  Dressing Change Frequency Daily  State of Healing Non-healing  Site / Wound Assessment Black;[place];Purple;Red  % Wound base Yellow/Fibrinous Exudate 5%  % Wound base Black/Eschar 90%  % Wound base Other/Granulation Tissue (Comment) 5%  Peri-wound Assessment Maceration;Erythema (blanchable);Purple  Wound Length (cm) 10.4 cm  Wound Width (cm) 10 cm  Wound Surface Area (cm^2) 104 cm^2  Margins Unattached edges (unapproximated)  Drainage Amount Minimal  Drainage Description Odor;Purulent  Treatment Cleansed;Debridement (Selective);Hydrotherapy (Pulse lavage);Packing (Saline gauze)       Hydrotherapy Pulsed lavage therapy - wound location: sacral ulcer Pulsed Lavage with Suction (psi): 8 psi Pulsed Lavage with Suction - Normal Saline Used: 2000 mL Pulsed Lavage Tip: Tip with splash shield Selective Debridement Selective Debridement - Location: sacral ulcer Selective Debridement - Tools Used: Forceps, Scissors, Scalpel Selective Debridement - Tissue Removed: nectrotic tissue, devitalized tissue      Wound Assessment and Plan  Wound  Therapy - Assess/Plan/Recommendations Wound Therapy - Clinical Statement: Patient's wound predominately unchanged since last hydro session with minimal loosening/breakdown of eschar noted after hydrotherapy. Debrided eschar margins and loosely adhered eschar at inferior portion of wound. Further opening of tunneled area between 6-7 o'clock moving inferiorly. Tunnel drained purulent brown malodorous drainage, with light pressure applied to inferior Lt buttock more drainage able to be expressed. Wound cleansed with pulsed lavage and redressed with santyl and saline soaked gauze. Dakins solution to  wound for 1wk (began today-1/21). Patient will continue to benefit from hydrotherapy to promote clean wound environment to reduce bioburden and local infection risk with goal of wound maintenance. Wound Therapy - Functional Problem List: Decreased strength;Decreased mobility;Decreased cognition;Decreased range of motion;Impaired sensation;Decreased skin integrity; Factors Delaying/Impairing Wound Healing: Immobility, Multiple medical problems, Diabetes Mellitus, Infection - systemic/local Hydrotherapy Plan: Dressing change, Debridement, Pulsatile lavage with suction Wound Therapy - Frequency: 6X / week Wound Therapy - Current Recommendations: Surgery consult Wound Therapy - Follow Up Recommendations: f/u pulsed lavage with suction, f/u selective debridement, dressing changes by RN   Wound Therapy Goals- Improve the function of patient's integumentary system by progressing the wound(s) through the phases of wound healing (inflammation - proliferation - remodeling) by: Wound Therapy Goals - Improve the function of patient's integumentary system by progressing the wound(s) through the phases of wound healing by: Decrease Necrotic Tissue to: 30% Decrease Necrotic Tissue - Progress: Progressing toward goal Increase Granulation Tissue to: 70% Increase Granulation Tissue - Progress: Progressing toward goal Improve  Drainage Characteristics: Min Improve Drainage Characteristics - Progress: Progressing toward goal Goals/treatment plan/discharge plan were made with and agreed upon by patient/family: No, Patient unable to participate in goals/treatment/discharge plan and family unavailable Time For Goal Achievement: 2 weeks Wound  Therapy - Potential for Goals: Poor   Goals will be updated until maximal potential achieved or discharge criteria met.  Discharge criteria: when goals achieved, discharge from hospital, MD decision/surgical intervention, no progress towards goals, refusal/missing three consecutive treatments without notification or medical reason.

## 2021-04-03 NOTE — Plan of Care (Signed)
  Problem: Safety: Goal: Ability to remain free from injury will improve Outcome: Progressing   

## 2021-04-03 NOTE — Progress Notes (Signed)
Pharmacy Antibiotic Note  Erik Lara is a 86 y.o. male with sacrum wound presented the ED on 04/01/2021 with c/o SOB. Pharmacy has been consulted to dose vancomycin, cefepime, and flagyl for wound infection.  Today, 04/03/2021: D4 abx Tmax 100.5 WBC increased 39.8 SCr improved significantly  Plan: Change cefepime to meropenem 1g IV q8h Continue to follow renal function  _____________________________________  Height: 6\' 1"  (185.4 cm) Weight: 71.9 kg (158 lb 9.6 oz) IBW/kg (Calculated) : 79.9  Temp (24hrs), Avg:99.4 F (37.4 C), Min:97.7 F (36.5 C), Max:100.5 F (38.1 C)  Recent Labs  Lab 04/13/2021 0840 04/06/2021 0849 04/03/2021 1146 04/04/2021 2216 04/01/21 0333 04/02/21 0330 04/03/21 0357  WBC 26.3*  --   --  27.4*  --  30.6* 39.8*  CREATININE 2.42*  --   --  1.54* 1.35* 1.21 1.01  LATICACIDVEN  --  3.5* 2.5* 2.6* 2.3*  --   --      Estimated Creatinine Clearance: 55.4 mL/min (by C-G formula based on SCr of 1.01 mg/dL).    No Known Allergies  Antimicrobials this admission: 1/18 CTX x1 1/18 vanc >> 1/18 cefepime >> 1/21 1/18 flagyl >> 1/21 meropenem >>  Dose adjustments this admission: 1/19 chg vanc 1500 q48 >> 1g q24; cefepime 2q24 >> 2q12 w/ improved SCr 1/20 increase vanc 1250mg  q24h starting 1/21  Microbiology results: 1/18 COVID+ 1/18 UCx: NGF 1/18 BCx: 1 of 4 GNR - nothing on BCID 1/19 MRSA PCR: neg  Thank you for allowing pharmacy to be a part of this patients care.  Peggyann Juba, PharmD, BCPS Pharmacy: [phone] 04/03/2021 8:36 AM

## 2021-04-03 NOTE — Progress Notes (Signed)
Daily Progress Note   Patient Name: Erik Lara       Date: 04/03/2021 DOB: 1935/11/11  Age: 86 y.o. MRN#: [id number] Attending Physician: Mariel Aloe, MD Primary Care Physician: Dorothyann Peng, NP Admit Date: 04/04/2021  Reason for Consultation/Follow-up: Establishing goals of care  Subjective: Patient essentially appears unresponsive, has noisy breathing, does not appear to have nonverbal gestures of distress or discomfort: No wincing grimacing moaning noted. I arrived to find son and wife at bedside, family meeting was a held outside the room for further discussions regarding patient's current condition as well as next steps towards disposition options and broad goals of care discussions, see below.  Length of Stay: 3  Current Medications: Scheduled Meds:   Chlorhexidine Gluconate Cloth  6 each Topical Daily   collagenase   Topical Daily   heparin  5,000 Units Subcutaneous Q8H   sodium hypochlorite   Irrigation BID    Continuous Infusions:  dextrose 100 mL/hr at 04/03/21 0142   meropenem (MERREM) IV 1 g (04/03/21 1032)   metronidazole 500 mg (04/03/21 0143)   vancomycin 1,250 mg (04/03/21 1035)    PRN Meds: acetaminophen, acetaminophen, ipratropium-albuterol, lip balm, morphine injection, ondansetron **OR** ondansetron (ZOFRAN) IV  Physical Exam         Essentially appears unresponsive Has noisy breathing Regular work of breathing Pictures from physical therapy wound evaluation have been noted, therapy notes reviewed.  Vital Signs: BP 102/65 (BP Location: Right Arm)    Pulse 86    Temp 98.3 F (36.8 C) (Rectal)    Resp 20    Ht _0  (1.854 m)    Wt 71.9 kg    SpO2 95%    BMI 20.92 kg/m  SpO2: SpO2: 95 % O2 Device: O2 Device: Room Air O2 Flow Rate: O2 Flow Rate  (L/min): 2 L/min  Intake/output summary:  Intake/Output Summary (Last 24 hours) at 04/03/2021 1133 Last data filed at 04/03/2021 0100 Gross per 24 hour  Intake 2446.99 ml  Output 400 ml  Net 2046.99 ml   LBM: Last BM Date:  (Unknown) Baseline Weight: Weight: 81.4 kg Most recent weight: Weight: 71.9 kg       Palliative Assessment/Data:      Patient Active Problem List   Diagnosis Date Noted   Protein-calorie malnutrition, severe 04/02/2021  Goals of care, counseling/discussion 21/01/7355   Acute metabolic encephalopathy 70/14/1030   Hypernatremia 04/01/2021   Decubitus ulcer of sacral region, unstageable ([place]) 04/01/2021   Acute urinary retention 04/01/2021   Hydronephrosis 04/01/2021   AKI (acute kidney injury) ([place]) 04/01/2021   Sepsis ([place]) 04/11/2021   Abnormal TSH    Generalized weakness    Pressure injury of skin 03/05/2021   Superficial venous thrombosis of arm, left 03/04/2021   Atrial fibrillation with RVR ([place]) 03/03/2021   Physical deconditioning 03/03/2021   COVID-19 virus infection 02/27/2021   2019 novel coronavirus disease (COVID-19) 02/26/2021   Hypocalcemia 02/26/2021   Mild protein malnutrition ([place]) 02/26/2021   Normocytic anemia 02/26/2021   Hearing loss of right ear 01/23/2017   Lewy body dementia ([place]) 09/13/2016   Type 2 diabetes mellitus without complication, without long-term current use of insulin ([place]) 03/30/2015   Hyperthyroidism 01/28/2013   Erectile Dysfunction 02/19/2008   Allergic rhinitis 02/19/2008   Hyperlipidemia 02/15/2007   Hepatic Cyst 02/15/2007    Palliative Care Assessment & Plan   Patient Profile:    Assessment: 86 year old gentleman with a history of dyslipidemia, Lewy body dementia, diabetes, sacral pressure injury.  Admitted to hospital medicine service with altered mental status shortness of breath found to have evidence of sepsis secondary to sacral decubitus ulcer, preliminary blood cultures suggesting  gram-negative rod bacteremia.  General surgery consultation as well as physical therapy consultation for hydrotherapy and wound care has been done.  Patient remains on antibiotics vancomycin and metronidazole. Palliative medicine consulted for ongoing goals of care discussions.  Recommendations/Plan:  Family meeting: Chart reviewed, patient seen, met with the patient's wife and son for continuation of goals of care discussions that were initiated by my colleague Dr. Hilma Favors at the time of initial consultation on 04-02-2021. Patient's wife states that the patient has been going " downhill" for some time.  We reviewed the context of this hospitalization.  We discussed about next steps disposition options.  Hospice philosophy of care was also initiated, particularly the type of care that can be provided inside a residential hospice setting was explored in detail.  We discussed about ongoing focus on pain and non pain symptom management, some brief end-of-life signs and symptoms were also discussed.  Discussed frankly but compassionately our concerns that the patient has entered in to the end-of-life dying trajectory and prognosis could be very very limited at this time. Patient's family requests for a very brief time-limited trial of current interventions for the next 24-36 hours or so to see if the patient has any response to current antibiotics and wound care.  They understand the serious irreversible nature of the patient's condition.  Additional family members are also expected to arrive soon.  Anticipate shifted towards comfort measures and transfer to residential hospice after this time trial is over, palliative medicine team will continue to follow along and help provide support and assistance to family as they navigate these decisions.  Offered active listening and supportive presence and compassionate care.   Code Status:    Code Status Orders  (From admission, onward)           Start      Ordered   04/01/21 1322  Do not attempt resuscitation (DNR)  Continuous       Question Answer Comment  In the event of cardiac or respiratory ARREST Do not call a code blue   In the event of cardiac or respiratory ARREST Do not perform Intubation, CPR, defibrillation or ACLS  In the event of cardiac or respiratory ARREST Use medication by any route, position, wound care, and other measures to relive pain and suffering. May use oxygen, suction and manual treatment of airway obstruction as needed for comfort.      04/01/21 1321           Code Status History     Date Active Date Inactive Code Status Order ID Comments User Context   04/02/2021 2157 04/01/2021 1321 Full Code [id number]  Jonnie Finner, DO Inpatient   02/26/2021 1327 03/14/2021 0231 Full Code [id number]  Reubin Milan, MD ED       Prognosis:  Hours - Days  Discharge Planning: To Be Determined  Care plan was discussed with  patient's wife and son at bedside, also discussed with Dr Hilma Favors.   Thank you for allowing the Palliative Medicine Team to assist in the care of this patient.   Time In: 11 Time Out: 11.35 Total Time 35 Prolonged Time Billed  no       Greater than 50%  of this time was spent counseling and coordinating care related to the above assessment and plan.  Loistine Chance, MD  Please contact Palliative Medicine Team phone at [phone] for questions and concerns.

## 2021-04-03 NOTE — Progress Notes (Signed)
PROGRESS NOTE    DELMAN GOSHORN  AOZ:[id number] DOB: January 31, 1936 DOA: 03/25/2021 PCP: Dorothyann Peng, NP   Brief Narrative: Erik Lara is 86 y.o. male with a history of hyperlipidemia, lewy body dementia, diabetes mellitus, sacral pressure injury. Patient presented secondary to altered mental status and shortness of breath found to have evidence of sepsis secondary to a sacral decubitus ulcer. Preliminary blood cultures suggest GNR bacteremia. General surgery on board for debridement of decubitus ulcer. Continuing broad spectrum antibiotics. Possible transition to full comfort measures and transfer to hospice facility if no improvement.   Assessment & Plan:   * Sepsis ([place])- (present on admission) Present on admission. Likely secondary to sacral decubitus ulcer. Procalcitonin of 0.31. Afebrile with leukocytosis. Antibiotics initiated on admission and blood cultures obtained. One set of blood cultures significant for GNRs. Leukocytosis continues to worsen -CBC in AM -Continue antibiotics, Decubitus ulcer of sacral region, unstageable ([place]) -Follow-up blood cultures  Decubitus ulcer of sacral region, unstageable [facility]) Concern that this is a source for infection/sepsis. General surgery consulted. CT pelvis significant for no abscess formation. Started empirically on Vancomycin, Cefepime and Flagyl. Patient with worsening leukocytosis and continued fevers -Continue Vancomycin -Discontinue Cefepime and Flagyl; start meropenem -General surgery recommendations: conservative management. Bedside debridement and hydrotherapy; no available recommendations today  Hypernatremia Unsure of chronicity. Secondary to poor oral intake. Initial worsening with peak of 152, now improving -Continue D5 water IV @ 100 mL/hr -Daily BMP -Daily weights  Hydronephrosis Likely related to acute urinary retention. Left greater than right. Associated AKI on admission.  AKI (acute kidney injury)  (HCC)-resolved as of 04/03/2021 Creatinine of 2.42 on admission. Secondary to acute urinary retention in addition to decreased oral intake. Foley catheter placed and IV fluids given with improved creatinine.  Pressure injury of skin- (present on admission) Left pretibial, left heel, right heel, medial sacrum, anterior/left ankle, anterior/left knee, posterior/right elbow. Wound care consulted for wound management recommendations.  COVID-19 virus infection- (present on admission) Previously diagnosed on 02/26/2021. Currently asymptomatic. No treatment initiated this admission. Chest x-ray without acute process.  Lewy body dementia ([place])- (present on admission) Patient follows with neurology. -Hold Namenda and Aricept at this time  Type 2 diabetes mellitus without complication, without long-term current use of insulin ([place]) Well controlled. Last hemoglobin A1C of 5.7% from 12/22.  Hyperlipidemia- (present on admission) -Hold home Crestor  Goals of care, counseling/discussion While at SNF, patient was referred to hospice; this was apparently not known by family at the time and they have not accepted hospice care. Patient is currently unable to eat/drink secondary to severe encephalopathy. Patient also shows signs of dehydration complicated by poor albumin/third spacing.  Palliative care medicine has recommended hospice facility discharge. At this time, family is hoping to continue medical management for a couple of days to see if any improvement, and if not, plan to transition to full comfort measures and transfer to hospice facility.  Protein-calorie malnutrition, severe Dietitian consulted. Patient is currently NPO secondary to mental status.  Acute urinary retention Unknown etiology. Bladder scan in the ED was significant for 1200 mL of measured urine. Foley catheter placed. Urine culture with no growth. Associated left moderate and right mild hydronephrosis. -Continue foley  catheter  Acute metabolic encephalopathy Possibly multifactorial in setting of underlying dementia, hypernatremia, acute infection. CT head unremarkable for acute process and EEG significant for evidence of diffuse cerebral dysfunction but no evidence of seizure activity.  Abnormal TSH- (present on admission) Recent TSH from 12/18 of 0.143  with free T4 of 1.39 (elevated) and T3 of 57 (low). Concerning for hyperthyroidism. -Repeat TSH, free T4, T3     DVT prophylaxis: Heparin subcu Code Status:   Code Status: DNR Family Communication: Wife and son on telephone Disposition Plan: Discharge pending continued treatment of sacral decubitus ulcer, continued goals of care   Consultants:  General surgery Palliative care medicine  Procedures:  HYDROTHERAPY  Antimicrobials: Vancomycin Cefepime Flagyl    Subjective: Non-verbal.  Objective: Vitals:   04/03/21 0459 04/03/21 0500 04/03/21 1203 04/03/21 1300  BP: 102/65     Pulse: 86     Resp: 20     Temp: 97.7 F (36.5 C) 98.3 F (36.8 C)    TempSrc: Axillary Rectal    SpO2: 95%  92% 90%  Weight:      Height:        Intake/Output Summary (Last 24 hours) at 04/03/2021 1350 Last data filed at 04/03/2021 0100 Gross per 24 hour  Intake 2246.99 ml  Output 400 ml  Net 1846.99 ml    Filed Weights   04/04/2021 0935 03/17/2021 2217  Weight: 81.4 kg 71.9 kg    Examination:  General exam: Appears calm and comfortable  Respiratory system: Rhonchi, likely transmitted from upper airway. Respiratory effort normal. Cardiovascular system: S1 & S2 heard, RRR. No murmurs, rubs, gallops or clicks. Gastrointestinal system: Abdomen is nondistended, soft and nontender. No organomegaly or masses felt. Normal bowel sounds heard. Central nervous system: Obtunded Musculoskeletal: No calf tenderness Skin: No cyanosis. No rashes   Data Reviewed: I have personally reviewed following labs and imaging studies  CBC Lab Results  Component Value  Date   WBC 39.8 (H) 04/03/2021   RBC 3.62 (L) 04/03/2021   HGB 10.6 (L) 04/03/2021   HCT 35.5 (L) 04/03/2021   MCV 98.1 04/03/2021   MCH 29.3 04/03/2021   PLT 195 04/03/2021   MCHC 29.9 (L) 04/03/2021   RDW 14.2 04/03/2021   LYMPHSABS 0.5 (L) 03/29/2021   MONOABS 0.7 04/09/2021   EOSABS 0.0 04/01/2021   BASOSABS 0.1 23/53/6144     Last metabolic panel Lab Results  Component Value Date   NA 150 (H) 04/03/2021   K 3.4 (L) 04/03/2021   CL 122 (H) 04/03/2021   CO2 21 (L) 04/03/2021   BUN 45 (H) 04/03/2021   CREATININE 1.01 04/03/2021   GLUCOSE 159 (H) 04/03/2021   GFRNONAA >60 04/03/2021   GFRAA 123 02/05/2008   CALCIUM 8.7 (L) 04/03/2021   PHOS 3.1 03/03/2021   PROT 5.4 (L) 04/01/2021   ALBUMIN 2.0 (L) 04/01/2021   BILITOT 1.0 04/01/2021   ALKPHOS 78 04/01/2021   AST 25 04/01/2021   ALT 31 04/01/2021   ANIONGAP 7 04/03/2021    CBG (last 3)  Recent Labs    04/02/21 2228 04/02/21 2356 04/03/21 0607  GLUCAP 141* 144* 143*      GFR: Estimated Creatinine Clearance: 55.4 mL/min (by C-G formula based on SCr of 1.01 mg/dL).  Coagulation Profile: Recent Labs  Lab 03/27/2021 0840 04/01/21 0333  INR 1.3* 1.4*     Recent Results (from the past 240 hour(s))  Blood Culture (routine x 2)     Status: None (Preliminary result)   Collection Time: 04/01/2021  8:40 AM   Specimen: BLOOD  Result Value Ref Range Status   Specimen Description   Final    BLOOD RIGHT ARM Performed at [facility] [address] [place], [place] [address]    Special Requests  Final    BOTTLES DRAWN AEROBIC AND ANAEROBIC Blood Culture adequate volume Performed at [facility] [address] [place], [place] [address]    Culture  Setup Time   Final    GRAM NEGATIVE RODS ANAEROBIC BOTTLE ONLY CRITICAL RESULT CALLED TO, READ BACK BY AND VERIFIED WITH: PHARMD CHRISTY, [phone] FCP    Culture   Final    GRAM NEGATIVE RODS IDENTIFICATION TO  FOLLOW Performed at [facility], [place] [address], [place], [place] [address]    Report Status PENDING  Incomplete  Blood Culture ID Panel (Reflexed)     Status: None   Collection Time: 03/16/2021  8:40 AM  Result Value Ref Range Status   Enterococcus faecalis NOT DETECTED NOT DETECTED Final   Enterococcus Faecium NOT DETECTED NOT DETECTED Final   Listeria monocytogenes NOT DETECTED NOT DETECTED Final   Staphylococcus species NOT DETECTED NOT DETECTED Final   Staphylococcus aureus (BCID) NOT DETECTED NOT DETECTED Final   Staphylococcus epidermidis NOT DETECTED NOT DETECTED Final   Staphylococcus lugdunensis NOT DETECTED NOT DETECTED Final   Streptococcus species NOT DETECTED NOT DETECTED Final   Streptococcus agalactiae NOT DETECTED NOT DETECTED Final   Streptococcus pneumoniae NOT DETECTED NOT DETECTED Final   Streptococcus pyogenes NOT DETECTED NOT DETECTED Final   A.calcoaceticus-baumannii NOT DETECTED NOT DETECTED Final   Bacteroides fragilis NOT DETECTED NOT DETECTED Final   Enterobacterales NOT DETECTED NOT DETECTED Final   Enterobacter cloacae complex NOT DETECTED NOT DETECTED Final   Escherichia coli NOT DETECTED NOT DETECTED Final   Klebsiella aerogenes NOT DETECTED NOT DETECTED Final   Klebsiella oxytoca NOT DETECTED NOT DETECTED Final   Klebsiella pneumoniae NOT DETECTED NOT DETECTED Final   Proteus species NOT DETECTED NOT DETECTED Final   Salmonella species NOT DETECTED NOT DETECTED Final   Serratia marcescens NOT DETECTED NOT DETECTED Final   Haemophilus influenzae NOT DETECTED NOT DETECTED Final   Neisseria meningitidis NOT DETECTED NOT DETECTED Final   Pseudomonas aeruginosa NOT DETECTED NOT DETECTED Final   Stenotrophomonas maltophilia NOT DETECTED NOT DETECTED Final   Candida albicans NOT DETECTED NOT DETECTED Final   Candida auris NOT DETECTED NOT DETECTED Final   Candida glabrata NOT DETECTED NOT DETECTED Final   Candida krusei NOT DETECTED NOT DETECTED  Final   Candida parapsilosis NOT DETECTED NOT DETECTED Final   Candida tropicalis NOT DETECTED NOT DETECTED Final   Cryptococcus neoformans/gattii NOT DETECTED NOT DETECTED Final    Comment: Performed at [facility], [address], [place], [place] [address]  Urine Culture     Status: None   Collection Time: 03/16/2021  8:47 AM   Specimen: In/Out Cath Urine  Result Value Ref Range Status   Specimen Description   Final    IN/OUT CATH URINE Performed at [facility] [address], [place], [place] [address]    Special Requests   Final    NONE Performed at [facility], [place] [address] [place], [place] [address]    Culture   Final    NO GROWTH Performed at [facility], [place] [address], [place], [place] [address]    Report Status 04/01/2021 FINAL  Final  Resp Panel by RT-PCR (Flu A&B, Covid) Nasopharyngeal Swab     Status: Abnormal   Collection Time: 03/29/2021  8:49 AM   Specimen: Nasopharyngeal Swab; Nasopharyngeal(NP) swabs in vial transport medium  Result Value Ref Range Status   SARS Coronavirus 2  by RT PCR POSITIVE (A) NEGATIVE Final    Comment: (NOTE) SARS-CoV-2 target nucleic acids are DETECTED.  The SARS-CoV-2 RNA is generally detectable in upper respiratory specimens during the acute phase of infection. Positive results are indicative of the presence of the identified virus, but do not rule out bacterial infection or co-infection with other pathogens not detected by the test. Clinical correlation with patient history and other diagnostic information is necessary to determine patient infection status. The expected result is Negative.  Fact Sheet for Patients: EntrepreneurPulse.com.au  Fact Sheet for Healthcare Providers: IncredibleEmployment.be  This test is not yet approved or cleared by the [place] FDA and  has been authorized for detection and/or diagnosis of SARS-CoV-2  by FDA under an Emergency Use Authorization (EUA).  This EUA will remain in effect (meaning this test can be used) for the duration of  the COVID-19 declaration under Section 564(b)(1) of the A ct, 21 U.S.C. section 360bbb-3(b)(1), unless the authorization is terminated or revoked sooner.     Influenza A by PCR NEGATIVE NEGATIVE Final   Influenza B by PCR NEGATIVE NEGATIVE Final    Comment: (NOTE) The Xpert Xpress SARS-CoV-2/FLU/RSV plus assay is intended as an aid in the diagnosis of influenza from Nasopharyngeal swab specimens and should not be used as a sole basis for treatment. Nasal washings and aspirates are unacceptable for Xpert Xpress SARS-CoV-2/FLU/RSV testing.  Fact Sheet for Patients: EntrepreneurPulse.com.au  Fact Sheet for Healthcare Providers: IncredibleEmployment.be  This test is not yet approved or cleared by the [place] FDA and has been authorized for detection and/or diagnosis of SARS-CoV-2 by FDA under an Emergency Use Authorization (EUA). This EUA will remain in effect (meaning this test can be used) for the duration of the COVID-19 declaration under Section 564(b)(1) of the Act, 21 U.S.C. section 360bbb-3(b)(1), unless the authorization is terminated or revoked.  Performed at [facility], [place] [address] [place], [place] [address]   Blood Culture (routine x 2)     Status: None (Preliminary result)   Collection Time: 04/07/2021 11:44 AM   Specimen: BLOOD LEFT HAND  Result Value Ref Range Status   Specimen Description   Final    BLOOD LEFT HAND Performed at [facility] [address] [place], [place] [address]    Special Requests   Final    BOTTLES DRAWN AEROBIC AND ANAEROBIC Blood Culture results may not be optimal due to an inadequate volume of blood received in culture bottles Performed at [facility] [address] [place], [place] [address]     Culture   Final    NO GROWTH 3 DAYS Performed at [facility], [place] [address], [place], [place] [address]    Report Status PENDING  Incomplete  MRSA Next Gen by PCR, Nasal     Status: None   Collection Time: 04/01/21 11:11 AM   Specimen: Nasal Mucosa; Nasal Swab  Result Value Ref Range Status   MRSA by PCR Next Gen NOT DETECTED NOT DETECTED Final    Comment: (NOTE) The GeneXpert MRSA Assay (FDA approved for NASAL specimens only), is one component of a comprehensive MRSA colonization surveillance program. It is not intended to diagnose MRSA infection nor to guide or monitor treatment for MRSA infections. Test performance is not FDA approved in patients less than 3 years old. Performed at [facility], [place] [address], [place], [place] [address]          Radiology Studies:  CT HEAD WO CONTRAST (5MM)  Result Date: 04/02/2021 CLINICAL DATA:  Altered mental status, COVID positive EXAM: CT HEAD WITHOUT CONTRAST TECHNIQUE: Contiguous axial images were obtained from the base of the skull through the vertex without intravenous contrast. RADIATION DOSE REDUCTION: This exam was performed according to the departmental dose-optimization program which includes automated exposure control, adjustment of the mA and/or kV according to patient size and/or use of iterative reconstruction technique. COMPARISON:  None. FINDINGS: Brain: Normal anatomic configuration. Moderate parenchymal volume loss is commensurate with the patient's age. Mild periventricular white matter changes are present likely reflecting the sequela of small vessel ischemia. No abnormal intra or extra-axial mass lesion or fluid collection. No abnormal mass effect or midline shift. No evidence of acute intracranial hemorrhage or infarct. Ventricular size is normal. Cerebellum unremarkable. Vascular: No asymmetric hyperdense vasculature at the skull base. Skull: Intact Sinuses/Orbits: Paranasal sinuses are clear.  Orbits are unremarkable. Other: Mastoid air cells and middle ear cavities are clear. IMPRESSION: No acute intracranial abnormality.  Moderate senescent change Electronically Signed   By: Fidela Salisbury M.D.   On: 04/02/2021 19:38   EEG adult  Result Date: 04/02/2021 Mickie Hillier, MD     04/02/2021  6:15 PM Routine EEG EEG Performed Date 04/02/2021 Duration: 23:51 Clinical Indication: suspected seizures EEG Procedure: This is a digitally recorded routine EEG electroencephalogram. The international 10-20 electrode placement system is used for scalp electrode placement. Eighteen channels of scalp EEG are recorded. The data are stored digitally and reviewed in reformatted montages for optimal display. EEG Description: This EEG is not well organized.   There is no clear posterior dominant rhythm.  The background consisted of a mixture of delta and theta frequencies primarily.  During drowsiness, background slowing increased.  Stage 2 sleep was not recorded. There were no definite focal abnormalities, persistent asymmetries or epileptiform discharges.  Artifact:  Of note, there was significant EMG artifact related to patient's movement precluding full evaluation of underlying EEG rhythms. Activating procedures:  Photic stimulation was not performed.  Hyperventilation was not performed. EKG: The EKG rhythm strip demonstrated a NSR at 90 bpm. EEG Classification: Abnormal EEG Interpretation: Background slowing, generalized This routine EEG recorded in the awake and drowsy states is abnormal.  The severe background slowing suggest severe diffuse cerebral dysfunction and/or sedative effect.  Please correlate clinically.  There were no definite focal abnormalities, persistent asymmetries or epileptiform discharges.         Scheduled Meds:  Chlorhexidine Gluconate Cloth  6 each Topical Daily   collagenase   Topical Daily   heparin  5,000 Units Subcutaneous Q8H   sodium hypochlorite   Irrigation BID   Continuous  Infusions:  dextrose 100 mL/hr at 04/03/21 1258   meropenem (MERREM) IV 1 g (04/03/21 1032)   vancomycin 1,250 mg (04/03/21 1035)     LOS: 3 days     Cordelia Poche, MD [facility] 04/03/2021, 1:50 PM  If 7PM-7AM, please contact night-coverage [email or web address]

## 2021-04-04 DIAGNOSIS — R7989 Other specified abnormal findings of blood chemistry: Secondary | ICD-10-CM | POA: Diagnosis not present

## 2021-04-04 DIAGNOSIS — A419 Sepsis, unspecified organism: Secondary | ICD-10-CM | POA: Diagnosis not present

## 2021-04-04 DIAGNOSIS — Z7189 Other specified counseling: Secondary | ICD-10-CM | POA: Diagnosis not present

## 2021-04-04 DIAGNOSIS — R338 Other retention of urine: Secondary | ICD-10-CM | POA: Diagnosis not present

## 2021-04-04 DIAGNOSIS — L89150 Pressure ulcer of sacral region, unstageable: Secondary | ICD-10-CM | POA: Diagnosis not present

## 2021-04-04 DIAGNOSIS — G9341 Metabolic encephalopathy: Secondary | ICD-10-CM | POA: Diagnosis not present

## 2021-04-04 LAB — CULTURE, BLOOD (ROUTINE X 2): Special Requests: ADEQUATE

## 2021-04-04 LAB — CBC
HCT: 30.4 % — ABNORMAL LOW (ref 39.0–52.0)
Hemoglobin: 9.4 g/dL — ABNORMAL LOW (ref 13.0–17.0)
MCH: 29.7 pg (ref 26.0–34.0)
MCHC: 30.9 g/dL (ref 30.0–36.0)
MCV: 96.2 fL (ref 80.0–100.0)
Platelets: 162 10*3/uL (ref 150–400)
RBC: 3.16 MIL/uL — ABNORMAL LOW (ref 4.22–5.81)
RDW: 14.3 % (ref 11.5–15.5)
WBC: 29.8 10*3/uL — ABNORMAL HIGH (ref 4.0–10.5)
nRBC: 0 % (ref 0.0–0.2)

## 2021-04-04 LAB — BASIC METABOLIC PANEL
Anion gap: 5 (ref 5–15)
BUN: 35 mg/dL — ABNORMAL HIGH (ref 8–23)
CO2: 21 mmol/L — ABNORMAL LOW (ref 22–32)
Calcium: 8.3 mg/dL — ABNORMAL LOW (ref 8.9–10.3)
Chloride: 120 mmol/L — ABNORMAL HIGH (ref 98–111)
Creatinine, Ser: 0.82 mg/dL (ref 0.61–1.24)
GFR, Estimated: >60 mL/min (ref >60)
Glucose, Bld: 141 mg/dL — ABNORMAL HIGH (ref 70–99)
Potassium: 2.9 mmol/L — ABNORMAL LOW (ref 3.5–5.1)
Sodium: 146 mmol/L — ABNORMAL HIGH (ref 135–145)

## 2021-04-04 LAB — GLUCOSE, CAPILLARY: Glucose-Capillary: 125 mg/dL — ABNORMAL HIGH (ref 70–99)

## 2021-04-04 LAB — PROCALCITONIN: Procalcitonin: 0.55 ng/mL

## 2021-04-04 LAB — TSH: TSH: 0.448 u[IU]/mL (ref 0.350–4.500)

## 2021-04-04 LAB — T4, FREE: Free T4: 1.1 ng/dL (ref 0.61–1.12)

## 2021-04-04 LAB — MAGNESIUM: Magnesium: 1.6 mg/dL — ABNORMAL LOW (ref 1.7–2.4)

## 2021-04-04 MED ORDER — MAGNESIUM SULFATE 2 GM/50ML IV SOLN
2.0000 g | Freq: Once | INTRAVENOUS | Status: AC
  Administered 2021-04-04: 2 g via INTRAVENOUS
  Filled 2021-04-04: qty 50

## 2021-04-04 MED ORDER — VANCOMYCIN HCL 1500 MG/300ML IV SOLN
1500.0000 mg | INTRAVENOUS | Status: DC
  Administered 2021-04-05: 1500 mg via INTRAVENOUS
  Filled 2021-04-04 (×2): qty 300

## 2021-04-04 MED ORDER — POTASSIUM CHLORIDE 10 MEQ/100ML IV SOLN
10.0000 meq | INTRAVENOUS | Status: AC
  Administered 2021-04-04 (×6): 10 meq via INTRAVENOUS
  Filled 2021-04-04 (×5): qty 100

## 2021-04-04 NOTE — Progress Notes (Signed)
Daily Progress Note   Patient Name: Erik Lara       Date: 04/04/2021 DOB: 08/07/1935  Age: 86 y.o. MRN#: [id number] Attending Physician: Mariel Aloe, MD Primary Care Physician: Dorothyann Peng, NP Admit Date: 04/09/2021  Reason for Consultation/Follow-up: Establishing goals of care  Subjective: Patient more awake today, opens eyes and attempts to interact with wife and son present in the room, we re discussed his current condition and broad goals of care, family asked about his blood work and for signs of whether his infection is getting any better or not, reviewed with family about his current antibiotics, blood work, notes from PT and surgery.    Length of Stay: 4  Current Medications: Scheduled Meds:   Chlorhexidine Gluconate Cloth  6 each Topical Daily   collagenase   Topical Daily   heparin  5,000 Units Subcutaneous Q8H   sodium hypochlorite   Irrigation BID    Continuous Infusions:  dextrose 100 mL/hr at 04/03/21 2154   meropenem (MERREM) IV 1 g (04/04/21 1039)   potassium chloride 10 mEq (04/04/21 1150)   [START ON 04/05/2021] vancomycin      PRN Meds: acetaminophen, acetaminophen, ipratropium-albuterol, lip balm, morphine injection, ondansetron **OR** ondansetron (ZOFRAN) IV  Physical Exam         Essentially appears unresponsive Has noisy breathing Regular work of breathing Pictures from physical therapy wound evaluation have been noted, therapy notes reviewed.  Vital Signs: BP (!) 103/51 (BP Location: Right Arm)    Pulse 72    Temp 98.3 F (36.8 C) (Oral)    Resp 20    Ht 6\' 1"  (1.854 m)    Wt 73 kg    SpO2 95%    BMI 21.24 kg/m  SpO2: SpO2: 95 % O2 Device: O2 Device: Room Air O2 Flow Rate: O2 Flow Rate (L/min): 2 L/min  Intake/output summary:   Intake/Output Summary (Last 24 hours) at 04/04/2021 1222 Last data filed at 04/04/2021 0505 Gross per 24 hour  Intake 1507.34 ml  Output 1150 ml  Net 357.34 ml    LBM: Last BM Date:  (unknown, PTA) Baseline Weight: Weight: 81.4 kg Most recent weight: Weight: 73 kg       Palliative Assessment/Data:      Patient Active Problem List   Diagnosis Date Noted  Protein-calorie malnutrition, severe 04/02/2021   Goals of care, counseling/discussion 80/99/8338   Acute metabolic encephalopathy 25/07/3974   Hypernatremia 04/01/2021   Decubitus ulcer of sacral region, unstageable ([place]) 04/01/2021   Acute urinary retention 04/01/2021   Hydronephrosis 04/01/2021   Sepsis ([place]) 03/26/2021   Abnormal TSH    Generalized weakness    Pressure injury of skin 03/05/2021   Superficial venous thrombosis of arm, left 03/04/2021   Atrial fibrillation with RVR ([place]) 03/03/2021   Physical deconditioning 03/03/2021   COVID-19 virus infection 02/27/2021   2019 novel coronavirus disease (COVID-19) 02/26/2021   Hypocalcemia 02/26/2021   Mild protein malnutrition ([place]) 02/26/2021   Normocytic anemia 02/26/2021   Hearing loss of right ear 01/23/2017   Lewy body dementia ([place]) 09/13/2016   Type 2 diabetes mellitus without complication, without long-term current use of insulin ([place]) 03/30/2015   Hyperthyroidism 01/28/2013   Erectile Dysfunction 02/19/2008   Allergic rhinitis 02/19/2008   Hyperlipidemia 02/15/2007   Hepatic Cyst 02/15/2007    Palliative Care Assessment & Plan   Patient Profile:    Assessment: 86 year old gentleman with a history of dyslipidemia, Lewy body dementia, diabetes, sacral pressure injury.  Admitted to hospital medicine service with altered mental status shortness of breath found to have evidence of sepsis secondary to sacral decubitus ulcer, preliminary blood cultures suggesting gram-negative rod bacteremia.  General surgery consultation as well as physical therapy  consultation for hydrotherapy and wound care has been done.  Patient remains on antibiotics vancomycin and metronidazole. Palliative medicine consulted for ongoing goals of care discussions.  Recommendations/Plan:  Patient's family wishes to continue with current mode of care, patient to remain on antibiotics and wound care, additional family also arriving today, monitor hospital course and continue broad goals of care discussions, PMT to follow.     Code Status:    Code Status Orders  (From admission, onward)           Start     Ordered   04/01/21 1322  Do not attempt resuscitation (DNR)  Continuous       Question Answer Comment  In the event of cardiac or respiratory ARREST Do not call a code blue   In the event of cardiac or respiratory ARREST Do not perform Intubation, CPR, defibrillation or ACLS   In the event of cardiac or respiratory ARREST Use medication by any route, position, wound care, and other measures to relive pain and suffering. May use oxygen, suction and manual treatment of airway obstruction as needed for comfort.      04/01/21 1321           Code Status History     Date Active Date Inactive Code Status Order ID Comments User Context   04/04/2021 2157 04/01/2021 1321 Full Code [id number]  Jonnie Finner, DO Inpatient   02/26/2021 1327 03/14/2021 0231 Full Code [id number]  Reubin Milan, MD ED       Prognosis:  Hours - Days  Discharge Planning: To Be Determined  Care plan was discussed with  patient's wife and son at bedside, also discussed with [facility] MD Dr Lonny Prude.   Thank you for allowing the Palliative Medicine Team to assist in the care of this patient.   Time In: 11 Time Out: 11.35 Total Time 35 Prolonged Time Billed  no       Greater than 50%  of this time was spent counseling and coordinating care related to the above assessment and plan.  Loistine Chance, MD  Please contact  Palliative Medicine Team phone at [phone] for questions and  concerns.

## 2021-04-04 NOTE — Progress Notes (Addendum)
PROGRESS NOTE    Erik Lara  IOM:[id number] DOB: 10-08-1935 DOA: 03/20/2021 PCP: Dorothyann Peng, NP   Brief Narrative: Erik Lara is 86 y.o. male with a history of hyperlipidemia, lewy body dementia, diabetes mellitus, sacral pressure injury. Patient presented secondary to altered mental status and shortness of breath found to have evidence of sepsis secondary to a sacral decubitus ulcer. Preliminary blood cultures suggest GNR bacteremia. General surgery on board for debridement of decubitus ulcer. Continuing broad spectrum antibiotics. Possible transition to full comfort measures and transfer to hospice facility if no improvement.   Assessment & Plan:   * Sepsis ([place])- (present on admission) Present on admission. Likely secondary to sacral decubitus ulcer. Procalcitonin of 0.31. Afebrile with leukocytosis. Antibiotics initiated on admission and blood cultures obtained. One set of blood cultures significant for GNRs. Leukocytosis continues to worsen -CBC in AM -Continue antibiotics, Decubitus ulcer of sacral region, unstageable ([place]) -Follow-up blood cultures  Decubitus ulcer of sacral region, unstageable [facility]) Concern that this is a source for infection/sepsis. General surgery consulted. CT pelvis significant for no abscess formation. Started empirically on Vancomycin, Cefepime and Flagyl. Patient developed worsening leukocytosis and continued fevers with subsequent change to meropenem IV -Continue Vancomycin -Discontinue Cefepime and Flagyl; start meropenem -General surgery recommendations: conservative management. Bedside debridement and hydrotherapy; no available recommendations today  Hypernatremia Unsure of chronicity. Secondary to poor oral intake. Initial worsening with peak of 152, now improving -Continue D5 water IV @ 100 mL/hr -Daily BMP -Daily weights  Hydronephrosis Likely related to acute urinary retention. Left greater than right. Associated AKI on  admission.  AKI (acute kidney injury) (HCC)-resolved as of 04/03/2021 Creatinine of 2.42 on admission. Secondary to acute urinary retention in addition to decreased oral intake. Foley catheter placed and IV fluids given with improved creatinine.  Pressure injury of skin- (present on admission) Left pretibial, left heel, right heel, medial sacrum, anterior/left ankle, anterior/left knee, posterior/right elbow. Wound care consulted for wound management recommendations.  COVID-19 virus infection- (present on admission) Previously diagnosed on 02/26/2021. Currently asymptomatic. No treatment initiated this admission. Chest x-ray without acute process.  Lewy body dementia ([place])- (present on admission) Patient follows with neurology. -Hold Namenda and Aricept at this time  Type 2 diabetes mellitus without complication, without long-term current use of insulin ([place]) Well controlled. Last hemoglobin A1C of 5.7% from 12/22.  Hyperlipidemia- (present on admission) -Hold home Crestor  Goals of care, counseling/discussion While at SNF, patient was referred to hospice; this was apparently not known by family at the time and they have not accepted hospice care. Patient is currently unable to eat/drink secondary to severe encephalopathy. Patient also shows signs of dehydration complicated by poor albumin/third spacing.  1/21: Palliative care medicine has recommended hospice facility discharge. At this time, family is hoping to continue medical management for a couple of days to see if any improvement, and if not, plan to transition to full comfort measures and transfer to hospice facility.  Protein-calorie malnutrition, severe Dietitian consulted. Patient is currently NPO secondary to mental status.  Acute urinary retention Unknown etiology. Bladder scan in the ED was significant for 1200 mL of measured urine. Foley catheter placed. Urine culture with no growth. Associated left moderate and right mild  hydronephrosis. -Continue foley catheter  Acute metabolic encephalopathy Possibly multifactorial in setting of underlying dementia, hypernatremia, acute infection. CT head unremarkable for acute process and EEG significant for evidence of diffuse cerebral dysfunction but no evidence of seizure activity.  Abnormal TSH- (present on  admission) Recent TSH from 12/18 of 0.143 with free T4 of 1.39 (elevated) and T3 of 57 (low). Concerning for hyperthyroidism. Repeat TSH and free T4 are normal -Follow-up T3     DVT prophylaxis: Heparin subcu Code Status:   Code Status: DNR Family Communication: Wife and son on telephone Disposition Plan: Discharge pending continued treatment of sacral decubitus ulcer, continued goals of care   Consultants:  General surgery Palliative care medicine  Procedures:  HYDROTHERAPY  Antimicrobials: Vancomycin Cefepime Flagyl    Subjective: Non verbal  Objective: Vitals:   04/03/21 1513 04/03/21 2229 04/04/21 0500 04/04/21 1324  BP:  (!) 101/49 (!) 103/51 119/70  Pulse:  85 72 68  Resp:  20 20 20   Temp:  99.8 F (37.7 C) 98.3 F (36.8 C) 97.9 F (36.6 C)  TempSrc:  Oral Oral Oral  SpO2:  95% 95% 99%  Weight: 73 kg     Height:        Intake/Output Summary (Last 24 hours) at 04/04/2021 1356 Last data filed at 04/04/2021 1300 Gross per 24 hour  Intake 3229.51 ml  Output 1150 ml  Net 2079.51 ml    Filed Weights   03/20/2021 0935 04/03/2021 2217 04/03/21 1513  Weight: 81.4 kg 71.9 kg 73 kg    Examination:  General exam: Appears calm and comfortable Respiratory system: Rhonchi. Increased expiratory phase. Some mild retractions noted. Cardiovascular system: S1 & S2 heard, RRR. No murmurs, rubs, gallops or clicks. Gastrointestinal system: Abdomen is nondistended, soft and nontender. No organomegaly or masses felt. Normal bowel sounds heard. Central nervous system: Obtunded Musculoskeletal: BLE edema. No calf tenderness Skin: No  cyanosis.   Data Reviewed: I have personally reviewed following labs and imaging studies  CBC Lab Results  Component Value Date   WBC 29.8 (H) 04/04/2021   RBC 3.16 (L) 04/04/2021   HGB 9.4 (L) 04/04/2021   HCT 30.4 (L) 04/04/2021   MCV 96.2 04/04/2021   MCH 29.7 04/04/2021   PLT 162 04/04/2021   MCHC 30.9 04/04/2021   RDW 14.3 04/04/2021   LYMPHSABS 0.5 (L) 03/21/2021   MONOABS 0.7 04/08/2021   EOSABS 0.0 03/30/2021   BASOSABS 0.1 71/08/2692     Last metabolic panel Lab Results  Component Value Date   NA 146 (H) 04/04/2021   K 2.9 (L) 04/04/2021   CL 120 (H) 04/04/2021   CO2 21 (L) 04/04/2021   BUN 35 (H) 04/04/2021   CREATININE 0.82 04/04/2021   GLUCOSE 141 (H) 04/04/2021   GFRNONAA >60 04/04/2021   GFRAA 123 02/05/2008   CALCIUM 8.3 (L) 04/04/2021   PHOS 3.1 03/03/2021   PROT 5.4 (L) 04/01/2021   ALBUMIN 2.0 (L) 04/01/2021   BILITOT 1.0 04/01/2021   ALKPHOS 78 04/01/2021   AST 25 04/01/2021   ALT 31 04/01/2021   ANIONGAP 5 04/04/2021    CBG (last 3)  Recent Labs    04/03/21 0607 04/03/21 1710 04/04/21 0459  GLUCAP 143* 131* 125*      GFR: Estimated Creatinine Clearance: 69.2 mL/min (by C-G formula based on SCr of 0.82 mg/dL).  Coagulation Profile: Recent Labs  Lab 03/18/2021 0840 04/01/21 0333  INR 1.3* 1.4*     Recent Results (from the past 240 hour(s))  Blood Culture (routine x 2)     Status: Abnormal   Collection Time: 03/19/2021  8:40 AM   Specimen: BLOOD  Result Value Ref Range Status   Specimen Description   Final    BLOOD RIGHT ARM Performed at [facility]  [place] [address] [place], [place] [address]    Special Requests   Final    BOTTLES DRAWN AEROBIC AND ANAEROBIC Blood Culture adequate volume Performed at [place] [address], [place], [place] [address]    Culture  Setup Time   Final    GRAM NEGATIVE RODS ANAEROBIC BOTTLE ONLY CRITICAL RESULT CALLED TO, READ BACK BY AND VERIFIED  WITH: PHARMD CHRISTY, [phone] FCP    Culture (A)  Final    BACTEROIDES THETAIOTAOMICRON BETA LACTAMASE POSITIVE Performed at [facility], [place] [address], [place], [place] [address]    Report Status 04/04/2021 FINAL  Final  Blood Culture ID Panel (Reflexed)     Status: None   Collection Time: 03/14/2021  8:40 AM  Result Value Ref Range Status   Enterococcus faecalis NOT DETECTED NOT DETECTED Final   Enterococcus Faecium NOT DETECTED NOT DETECTED Final   Listeria monocytogenes NOT DETECTED NOT DETECTED Final   Staphylococcus species NOT DETECTED NOT DETECTED Final   Staphylococcus aureus (BCID) NOT DETECTED NOT DETECTED Final   Staphylococcus epidermidis NOT DETECTED NOT DETECTED Final   Staphylococcus lugdunensis NOT DETECTED NOT DETECTED Final   Streptococcus species NOT DETECTED NOT DETECTED Final   Streptococcus agalactiae NOT DETECTED NOT DETECTED Final   Streptococcus pneumoniae NOT DETECTED NOT DETECTED Final   Streptococcus pyogenes NOT DETECTED NOT DETECTED Final   A.calcoaceticus-baumannii NOT DETECTED NOT DETECTED Final   Bacteroides fragilis NOT DETECTED NOT DETECTED Final   Enterobacterales NOT DETECTED NOT DETECTED Final   Enterobacter cloacae complex NOT DETECTED NOT DETECTED Final   Escherichia coli NOT DETECTED NOT DETECTED Final   Klebsiella aerogenes NOT DETECTED NOT DETECTED Final   Klebsiella oxytoca NOT DETECTED NOT DETECTED Final   Klebsiella pneumoniae NOT DETECTED NOT DETECTED Final   Proteus species NOT DETECTED NOT DETECTED Final   Salmonella species NOT DETECTED NOT DETECTED Final   Serratia marcescens NOT DETECTED NOT DETECTED Final   Haemophilus influenzae NOT DETECTED NOT DETECTED Final   Neisseria meningitidis NOT DETECTED NOT DETECTED Final   Pseudomonas aeruginosa NOT DETECTED NOT DETECTED Final   Stenotrophomonas maltophilia NOT DETECTED NOT DETECTED Final   Candida albicans NOT DETECTED NOT DETECTED Final   Candida auris NOT DETECTED  NOT DETECTED Final   Candida glabrata NOT DETECTED NOT DETECTED Final   Candida krusei NOT DETECTED NOT DETECTED Final   Candida parapsilosis NOT DETECTED NOT DETECTED Final   Candida tropicalis NOT DETECTED NOT DETECTED Final   Cryptococcus neoformans/gattii NOT DETECTED NOT DETECTED Final    Comment: Performed at [facility], [address], [place], [place] [address]  Urine Culture     Status: None   Collection Time: 03/19/2021  8:47 AM   Specimen: In/Out Cath Urine  Result Value Ref Range Status   Specimen Description   Final    IN/OUT CATH URINE Performed at [place] [address], [place], [place] [address]    Special Requests   Final    NONE Performed at [facility], [place] [address] [place], [place] [address]    Culture   Final    NO GROWTH Performed at [facility], [place] [address], [place], [place] [address]    Report Status 04/01/2021 FINAL  Final  Resp Panel by RT-PCR (Flu A&B, Covid) Nasopharyngeal Swab     Status: Abnormal   Collection Time: 04/09/2021  8:49 AM   Specimen: Nasopharyngeal Swab;  Nasopharyngeal(NP) swabs in vial transport medium  Result Value Ref Range Status   SARS Coronavirus 2 by RT PCR POSITIVE (A) NEGATIVE Final    Comment: (NOTE) SARS-CoV-2 target nucleic acids are DETECTED.  The SARS-CoV-2 RNA is generally detectable in upper respiratory specimens during the acute phase of infection. Positive results are indicative of the presence of the identified virus, but do not rule out bacterial infection or co-infection with other pathogens not detected by the test. Clinical correlation with patient history and other diagnostic information is necessary to determine patient infection status. The expected result is Negative.  Fact Sheet for Patients: EntrepreneurPulse.com.au  Fact Sheet for Healthcare Providers: IncredibleEmployment.be  This test is not yet  approved or cleared by the [place] FDA and  has been authorized for detection and/or diagnosis of SARS-CoV-2 by FDA under an Emergency Use Authorization (EUA).  This EUA will remain in effect (meaning this test can be used) for the duration of  the COVID-19 declaration under Section 564(b)(1) of the A ct, 21 U.S.C. section 360bbb-3(b)(1), unless the authorization is terminated or revoked sooner.     Influenza A by PCR NEGATIVE NEGATIVE Final   Influenza B by PCR NEGATIVE NEGATIVE Final    Comment: (NOTE) The Xpert Xpress SARS-CoV-2/FLU/RSV plus assay is intended as an aid in the diagnosis of influenza from Nasopharyngeal swab specimens and should not be used as a sole basis for treatment. Nasal washings and aspirates are unacceptable for Xpert Xpress SARS-CoV-2/FLU/RSV testing.  Fact Sheet for Patients: EntrepreneurPulse.com.au  Fact Sheet for Healthcare Providers: IncredibleEmployment.be  This test is not yet approved or cleared by the [place] FDA and has been authorized for detection and/or diagnosis of SARS-CoV-2 by FDA under an Emergency Use Authorization (EUA). This EUA will remain in effect (meaning this test can be used) for the duration of the COVID-19 declaration under Section 564(b)(1) of the Act, 21 U.S.C. section 360bbb-3(b)(1), unless the authorization is terminated or revoked.  Performed at [facility], [place] [address], [place], [place] [address]   Blood Culture (routine x 2)     Status: None (Preliminary result)   Collection Time: 03/24/2021 11:44 AM   Specimen: BLOOD LEFT HAND  Result Value Ref Range Status   Specimen Description   Final    BLOOD LEFT HAND Performed at [place] [address] [place], [place] [address]    Special Requests   Final    BOTTLES DRAWN AEROBIC AND ANAEROBIC Blood Culture results may not be optimal due to an inadequate volume of blood received  in culture bottles Performed at [place] [address], [place], [place] [address]    Culture   Final    NO GROWTH 4 DAYS Performed at [facility], [place] [address], [place], [place] [address]    Report Status PENDING  Incomplete  MRSA Next Gen by PCR, Nasal     Status: None   Collection Time: 04/01/21 11:11 AM   Specimen: Nasal Mucosa; Nasal Swab  Result Value Ref Range Status   MRSA by PCR Next Gen NOT DETECTED NOT DETECTED Final    Comment: (NOTE) The GeneXpert MRSA Assay (FDA approved for NASAL specimens only), is one component of a comprehensive MRSA colonization surveillance program. It is not intended to diagnose MRSA infection nor to guide or monitor treatment for MRSA infections. Test performance is not FDA approved in patients less than 6 years old. Performed at [facility] [place], [address] [place], [place] [address]          Radiology Studies: CT HEAD WO CONTRAST (5MM)  Result Date: 04/02/2021 CLINICAL DATA:  Altered mental status, COVID positive EXAM: CT HEAD WITHOUT CONTRAST TECHNIQUE: Contiguous axial images were obtained from the base of the skull through the vertex without intravenous contrast. RADIATION DOSE REDUCTION: This exam was performed according to the departmental dose-optimization program which includes automated exposure control, adjustment of the mA and/or kV according to patient size and/or use of iterative reconstruction technique. COMPARISON:  None. FINDINGS: Brain: Normal anatomic configuration. Moderate parenchymal volume loss is commensurate with the patient's age. Mild periventricular white matter changes are present likely reflecting the sequela of small vessel ischemia. No abnormal intra or extra-axial mass lesion or fluid collection. No abnormal mass effect or midline shift. No evidence of acute intracranial hemorrhage or infarct. Ventricular size is normal. Cerebellum unremarkable. Vascular: No  asymmetric hyperdense vasculature at the skull base. Skull: Intact Sinuses/Orbits: Paranasal sinuses are clear. Orbits are unremarkable. Other: Mastoid air cells and middle ear cavities are clear. IMPRESSION: No acute intracranial abnormality.  Moderate senescent change Electronically Signed   By: Fidela Salisbury M.D.   On: 04/02/2021 19:38   EEG adult  Result Date: 04/02/2021 Mickie Hillier, MD     04/02/2021  6:15 PM Routine EEG EEG Performed Date 04/02/2021 Duration: 23:51 Clinical Indication: suspected seizures EEG Procedure: This is a digitally recorded routine EEG electroencephalogram. The international 10-20 electrode placement system is used for scalp electrode placement. Eighteen channels of scalp EEG are recorded. The data are stored digitally and reviewed in reformatted montages for optimal display. EEG Description: This EEG is not well organized.   There is no clear posterior dominant rhythm.  The background consisted of a mixture of delta and theta frequencies primarily.  During drowsiness, background slowing increased.  Stage 2 sleep was not recorded. There were no definite focal abnormalities, persistent asymmetries or epileptiform discharges.  Artifact:  Of note, there was significant EMG artifact related to patient's movement precluding full evaluation of underlying EEG rhythms. Activating procedures:  Photic stimulation was not performed.  Hyperventilation was not performed. EKG: The EKG rhythm strip demonstrated a NSR at 90 bpm. EEG Classification: Abnormal EEG Interpretation: Background slowing, generalized This routine EEG recorded in the awake and drowsy states is abnormal.  The severe background slowing suggest severe diffuse cerebral dysfunction and/or sedative effect.  Please correlate clinically.  There were no definite focal abnormalities, persistent asymmetries or epileptiform discharges.         Scheduled Meds:  Chlorhexidine Gluconate Cloth  6 each Topical Daily   collagenase    Topical Daily   heparin  5,000 Units Subcutaneous Q8H   sodium hypochlorite   Irrigation BID   Continuous Infusions:  dextrose 100 mL/hr at 04/03/21 2154   meropenem (MERREM) IV 1 g (04/04/21 1039)   [START ON 04/05/2021] vancomycin       LOS: 4 days     Cordelia Poche, MD [facility] 04/04/2021, 1:56 PM  If 7PM-7AM, please contact night-coverage [email or web address]

## 2021-04-04 NOTE — Plan of Care (Signed)
  Problem: Coping: Goal: Level of anxiety will decrease Outcome: Progressing   Problem: Pain Managment: Goal: General experience of comfort will improve Outcome: Progressing   Problem: Nutrition: Goal: Adequate nutrition will be maintained Outcome: Not Progressing   

## 2021-04-05 DIAGNOSIS — R338 Other retention of urine: Secondary | ICD-10-CM | POA: Diagnosis not present

## 2021-04-05 DIAGNOSIS — A419 Sepsis, unspecified organism: Secondary | ICD-10-CM | POA: Diagnosis not present

## 2021-04-05 DIAGNOSIS — G9341 Metabolic encephalopathy: Secondary | ICD-10-CM | POA: Diagnosis not present

## 2021-04-05 DIAGNOSIS — R7989 Other specified abnormal findings of blood chemistry: Secondary | ICD-10-CM | POA: Diagnosis not present

## 2021-04-05 LAB — CBC
HCT: 32.2 % — ABNORMAL LOW (ref 39.0–52.0)
Hemoglobin: 9.9 g/dL — ABNORMAL LOW (ref 13.0–17.0)
MCH: 29.2 pg (ref 26.0–34.0)
MCHC: 30.7 g/dL (ref 30.0–36.0)
MCV: 95 fL (ref 80.0–100.0)
Platelets: 164 10*3/uL (ref 150–400)
RBC: 3.39 MIL/uL — ABNORMAL LOW (ref 4.22–5.81)
RDW: 14 % (ref 11.5–15.5)
WBC: 17.8 10*3/uL — ABNORMAL HIGH (ref 4.0–10.5)
nRBC: 0 % (ref 0.0–0.2)

## 2021-04-05 LAB — CULTURE, BLOOD (ROUTINE X 2): Culture: NO GROWTH

## 2021-04-05 LAB — BASIC METABOLIC PANEL
Anion gap: 4 — ABNORMAL LOW (ref 5–15)
BUN: 28 mg/dL — ABNORMAL HIGH (ref 8–23)
CO2: 21 mmol/L — ABNORMAL LOW (ref 22–32)
Calcium: 8.3 mg/dL — ABNORMAL LOW (ref 8.9–10.3)
Chloride: 119 mmol/L — ABNORMAL HIGH (ref 98–111)
Creatinine, Ser: 0.54 mg/dL — ABNORMAL LOW (ref 0.61–1.24)
GFR, Estimated: >60 mL/min (ref >60)
Glucose, Bld: 136 mg/dL — ABNORMAL HIGH (ref 70–99)
Potassium: 3.5 mmol/L (ref 3.5–5.1)
Sodium: 144 mmol/L (ref 135–145)

## 2021-04-05 LAB — GLUCOSE, CAPILLARY
Glucose-Capillary: 125 mg/dL — ABNORMAL HIGH (ref 70–99)
Glucose-Capillary: 131 mg/dL — ABNORMAL HIGH (ref 70–99)

## 2021-04-05 LAB — T3: T3, Total: 76 ng/dL (ref 71–180)

## 2021-04-05 MED ORDER — LORAZEPAM 2 MG/ML PO CONC: 1.0000 mg | ORAL | Status: DC | PRN

## 2021-04-05 MED ORDER — LORAZEPAM 2 MG/ML IJ SOLN
0.5000 mg | INTRAMUSCULAR | Status: DC | PRN
  Administered 2021-04-06: 15:00:00 0.5 mg via INTRAVENOUS
  Filled 2021-04-05: qty 1

## 2021-04-05 MED ORDER — MORPHINE SULFATE (CONCENTRATE) 10 MG/0.5ML PO SOLN
10.0000 mg | ORAL | Status: DC
  Administered 2021-04-05 – 2021-04-07 (×14): 10 mg via SUBLINGUAL
  Filled 2021-04-05 (×15): qty 0.5

## 2021-04-05 NOTE — Progress Notes (Signed)
Palliative Care Progress Note  I met with patient's wife and his two son's Will and [name] to discuss goals of care. I explained his current situation including state of his wound, infection, mental; status, nutritional status and current interventions, as well as the anticipated trajectory of his illness should aggressive and invasive medical/surgical treatment continue vs. Transitioning to comfort care interventions.  Based on their stated goals of care and a desire to not prolong his death and state of suffering related to his infected wound and progressive Lewy Body Dementia I recommended transitioning to comfort care and explained the process in detail. I also made a recommendation for Hospice IPU.  Family are all in agreement with comfort measures and his wife requests referral be made to [facility] specifically.  Prognosis: <2 weeks, not taking PO nutrition or fluids, mostly unresponsive, will open eyes to gentle stimulation.  Recommend:  Comfort Measures Referral to Hospice IPU. Will continue to support patient and family through the EOL process.  Lane Hacker, DO Palliative Medicine

## 2021-04-05 NOTE — Progress Notes (Signed)
PROGRESS NOTE    Erik Lara  VVO:[id number] DOB: 1935/11/26 DOA: 03/30/2021 PCP: Dorothyann Peng, NP   Brief Narrative: Erik Lara is 86 y.o. male with a history of hyperlipidemia, lewy body dementia, diabetes mellitus, sacral pressure injury. Patient presented secondary to altered mental status and shortness of breath found to have evidence of sepsis secondary to a sacral decubitus ulcer. Blood cultures significant for bacteroides thetaiotaomicron. General surgery on board for debridement of decubitus ulcer. Patient is now transitioned to comfort measures.   Assessment & Plan:   * Sepsis ([place])- (present on admission) Present on admission. Likely secondary to sacral decubitus ulcer. Procalcitonin of 0.31. Afebrile with leukocytosis. Antibiotics initiated on admission and blood cultures obtained. One set of blood cultures significant for Bacteroides Thetaiotaomicron. Leukocytosis continues improved with change in antibiotics. Patient now transitioned to comfort measures.  Decubitus ulcer of sacral region, unstageable [facility]) Concern that this is a source for infection/sepsis. General surgery consulted. CT pelvis significant for no abscess formation. Started empirically on Vancomycin, Cefepime and Flagyl. Patient developed worsening leukocytosis and continued fevers with subsequent change to meropenem IV. Now transitioned to comfort measures.  Hypernatremia-resolved as of 04/05/2021 Unsure of chronicity. Secondary to poor oral intake. Initial worsening with peak of 152, now resolved with IV fluids. Comfort measures.  Hydronephrosis Likely related to acute urinary retention. Left greater than right. Associated AKI on admission.  AKI (acute kidney injury) (HCC)-resolved as of 04/03/2021 Creatinine of 2.42 on admission. Secondary to acute urinary retention in addition to decreased oral intake. Foley catheter placed and IV fluids given with improved creatinine.  Pressure injury of  skin- (present on admission) Left pretibial, left heel, right heel, medial sacrum, anterior/left ankle, anterior/left knee, posterior/right elbow. Wound care consulted for wound management recommendations.  COVID-19 virus infection- (present on admission) Previously diagnosed on 02/26/2021. Currently asymptomatic. No treatment initiated this admission. Chest x-ray without acute process.  Lewy body dementia ([place])- (present on admission) Patient follows with neurology. Hold Namenda and Aricept  Type 2 diabetes mellitus without complication, without long-term current use of insulin (HCC) Well controlled. Last hemoglobin A1C of 5.7% from 12/22.  Hyperlipidemia- (present on admission) Hold home Crestor  Goals of care, counseling/discussion While at SNF, patient was referred to hospice; this was apparently not known by family at the time and they have not accepted hospice care. Patient is currently unable to eat/drink secondary to severe encephalopathy. Patient also shows signs of dehydration complicated by poor albumin/third spacing.  1/23: Transition to full comfort measures with plan to discharge to hospice facility.  Protein-calorie malnutrition, severe Dietitian consulted. Patient is currently NPO secondary to mental status.  Acute urinary retention Unknown etiology. Bladder scan in the ED was significant for 1200 mL of measured urine. Foley catheter placed. Urine culture with no growth. Associated left moderate and right mild hydronephrosis. -Continue foley catheter for comfort  Acute metabolic encephalopathy Possibly multifactorial in setting of underlying dementia, hypernatremia, acute infection. CT head unremarkable for acute process and EEG significant for evidence of diffuse cerebral dysfunction but no evidence of seizure activity. Some improvement with some ability to somewhat follow simple commands.  Abnormal TSH- (present on admission) Recent TSH from 12/18 of 0.143 with free  T4 of 1.39 (elevated) and T3 of 57 (low). Concerning for hyperthyroidism. Repeat TSH and free T4 are normal. Comfort measures.     DVT prophylaxis: Heparin subcu Code Status:   Code Status: DNR Family Communication: Wife and son at bedside Disposition Plan: Discharge to hospice  facility   Consultants:  General surgery Palliative care medicine  Procedures:  HYDROTHERAPY  Antimicrobials: Vancomycin Cefepime Flagyl    Subjective: Non-verbal  Objective: Vitals:   04/04/21 1324 04/04/21 2001 04/05/21 0420 04/05/21 1205  BP: 119/70 (!) 115/94 105/63 (!) 104/50  Pulse: 68 76 63 (!) 54  Resp: 20 18 18 18   Temp: 97.9 F (36.6 C) 98.4 F (36.9 C) 97.9 F (36.6 C) 97.8 F (36.6 C)  TempSrc: Oral Oral  Oral  SpO2: 99% 98% 97% 97%  Weight:      Height:        Intake/Output Summary (Last 24 hours) at 04/05/2021 1503 Last data filed at 04/05/2021 0900 Gross per 24 hour  Intake 1516.19 ml  Output 1000 ml  Net 516.19 ml    Filed Weights   03/18/2021 0935 03/22/2021 2217 04/03/21 1513  Weight: 81.4 kg 71.9 kg 73 kg    Examination:  General exam: Appears comfortable Respiratory system: Respiratory effort normal. Cardiovascular system: S1 & S2 heard, Gastrointestinal system: Abdomen is non-distended Central nervous system: Alert, follows some rudimentary commands. Musculoskeletal: No edema. No calf tenderness Skin: No cyanosis. No rashes   Data Reviewed: I have personally reviewed following labs and imaging studies  CBC Lab Results  Component Value Date   WBC 17.8 (H) 04/05/2021   RBC 3.39 (L) 04/05/2021   HGB 9.9 (L) 04/05/2021   HCT 32.2 (L) 04/05/2021   MCV 95.0 04/05/2021   MCH 29.2 04/05/2021   PLT 164 04/05/2021   MCHC 30.7 04/05/2021   RDW 14.0 04/05/2021   LYMPHSABS 0.5 (L) 04/07/2021   MONOABS 0.7 04/07/2021   EOSABS 0.0 03/22/2021   BASOSABS 0.1 35/70/1779     Last metabolic panel Lab Results  Component Value Date   NA 144 04/05/2021   K 3.5  04/05/2021   CL 119 (H) 04/05/2021   CO2 21 (L) 04/05/2021   BUN 28 (H) 04/05/2021   CREATININE 0.54 (L) 04/05/2021   GLUCOSE 136 (H) 04/05/2021   GFRNONAA >60 04/05/2021   GFRAA 123 02/05/2008   CALCIUM 8.3 (L) 04/05/2021   PHOS 3.1 03/03/2021   PROT 5.4 (L) 04/01/2021   ALBUMIN 2.0 (L) 04/01/2021   BILITOT 1.0 04/01/2021   ALKPHOS 78 04/01/2021   AST 25 04/01/2021   ALT 31 04/01/2021   ANIONGAP 4 (L) 04/05/2021    CBG (last 3)  Recent Labs    04/04/21 0459 04/05/21 0451 04/05/21 0506  GLUCAP 125* 131* 125*      GFR: Estimated Creatinine Clearance: 71 mL/min (A) (by C-G formula based on SCr of 0.54 mg/dL (L)).  Coagulation Profile: Recent Labs  Lab 03/20/2021 0840 04/01/21 0333  INR 1.3* 1.4*     Recent Results (from the past 240 hour(s))  Blood Culture (routine x 2)     Status: Abnormal   Collection Time: 03/23/2021  8:40 AM   Specimen: BLOOD  Result Value Ref Range Status   Specimen Description   Final    BLOOD RIGHT ARM Performed at [facility] [address], [place], [place] [address]    Special Requests   Final    BOTTLES DRAWN AEROBIC AND ANAEROBIC Blood Culture adequate volume Performed at [facility] [address] [place], [place] [address]    Culture  Setup Time   Final    GRAM NEGATIVE RODS ANAEROBIC BOTTLE ONLY CRITICAL RESULT CALLED TO, READ BACK BY AND VERIFIED WITH: [place], S [id number] FCP  Culture (A)  Final    BACTEROIDES THETAIOTAOMICRON BETA LACTAMASE POSITIVE Performed at [facility], [place] [address], [place], [place] [address]    Report Status 04/04/2021 FINAL  Final  Blood Culture ID Panel (Reflexed)     Status: None   Collection Time: 03/15/2021  8:40 AM  Result Value Ref Range Status   Enterococcus faecalis NOT DETECTED NOT DETECTED Final   Enterococcus Faecium NOT DETECTED NOT DETECTED Final   Listeria monocytogenes NOT DETECTED NOT DETECTED Final   Staphylococcus  species NOT DETECTED NOT DETECTED Final   Staphylococcus aureus (BCID) NOT DETECTED NOT DETECTED Final   Staphylococcus epidermidis NOT DETECTED NOT DETECTED Final   Staphylococcus lugdunensis NOT DETECTED NOT DETECTED Final   Streptococcus species NOT DETECTED NOT DETECTED Final   Streptococcus agalactiae NOT DETECTED NOT DETECTED Final   Streptococcus pneumoniae NOT DETECTED NOT DETECTED Final   Streptococcus pyogenes NOT DETECTED NOT DETECTED Final   A.calcoaceticus-baumannii NOT DETECTED NOT DETECTED Final   Bacteroides fragilis NOT DETECTED NOT DETECTED Final   Enterobacterales NOT DETECTED NOT DETECTED Final   Enterobacter cloacae complex NOT DETECTED NOT DETECTED Final   Escherichia coli NOT DETECTED NOT DETECTED Final   Klebsiella aerogenes NOT DETECTED NOT DETECTED Final   Klebsiella oxytoca NOT DETECTED NOT DETECTED Final   Klebsiella pneumoniae NOT DETECTED NOT DETECTED Final   Proteus species NOT DETECTED NOT DETECTED Final   Salmonella species NOT DETECTED NOT DETECTED Final   Serratia marcescens NOT DETECTED NOT DETECTED Final   Haemophilus influenzae NOT DETECTED NOT DETECTED Final   Neisseria meningitidis NOT DETECTED NOT DETECTED Final   Pseudomonas aeruginosa NOT DETECTED NOT DETECTED Final   Stenotrophomonas maltophilia NOT DETECTED NOT DETECTED Final   Candida albicans NOT DETECTED NOT DETECTED Final   Candida auris NOT DETECTED NOT DETECTED Final   Candida glabrata NOT DETECTED NOT DETECTED Final   Candida krusei NOT DETECTED NOT DETECTED Final   Candida parapsilosis NOT DETECTED NOT DETECTED Final   Candida tropicalis NOT DETECTED NOT DETECTED Final   Cryptococcus neoformans/gattii NOT DETECTED NOT DETECTED Final    Comment: Performed at [facility], [address], [place], [place] [address]  Urine Culture     Status: None   Collection Time: 03/14/2021  8:47 AM   Specimen: In/Out Cath Urine  Result Value Ref Range Status   Specimen Description   Final     IN/OUT CATH URINE Performed at [facility] [address] [place], [place] [address]    Special Requests   Final    NONE Performed at [facility], [place] [address], [place], [place] [address]    Culture   Final    NO GROWTH Performed at [facility], [place] [address], [place], [place] [address]    Report Status 04/01/2021 FINAL  Final  Resp Panel by RT-PCR (Flu A&B, Covid) Nasopharyngeal Swab     Status: Abnormal   Collection Time: 03/19/2021  8:49 AM   Specimen: Nasopharyngeal Swab; Nasopharyngeal(NP) swabs in vial transport medium  Result Value Ref Range Status   SARS Coronavirus 2 by RT PCR POSITIVE (A) NEGATIVE Final    Comment: (NOTE) SARS-CoV-2 target nucleic acids are DETECTED.  The SARS-CoV-2 RNA is generally detectable in upper respiratory specimens during the acute phase of infection. Positive results are indicative of the presence of the identified virus, but do not rule out bacterial infection or co-infection with other pathogens not detected by the test. Clinical  correlation with patient history and other diagnostic information is necessary to determine patient infection status. The expected result is Negative.  Fact Sheet for Patients: EntrepreneurPulse.com.au  Fact Sheet for Healthcare Providers: IncredibleEmployment.be  This test is not yet approved or cleared by the [place] FDA and  has been authorized for detection and/or diagnosis of SARS-CoV-2 by FDA under an Emergency Use Authorization (EUA).  This EUA will remain in effect (meaning this test can be used) for the duration of  the COVID-19 declaration under Section 564(b)(1) of the A ct, 21 U.S.C. section 360bbb-3(b)(1), unless the authorization is terminated or revoked sooner.     Influenza A by PCR NEGATIVE NEGATIVE Final   Influenza B by PCR NEGATIVE NEGATIVE Final    Comment: (NOTE) The Xpert Xpress  SARS-CoV-2/FLU/RSV plus assay is intended as an aid in the diagnosis of influenza from Nasopharyngeal swab specimens and should not be used as a sole basis for treatment. Nasal washings and aspirates are unacceptable for Xpert Xpress SARS-CoV-2/FLU/RSV testing.  Fact Sheet for Patients: EntrepreneurPulse.com.au  Fact Sheet for Healthcare Providers: IncredibleEmployment.be  This test is not yet approved or cleared by the [place] FDA and has been authorized for detection and/or diagnosis of SARS-CoV-2 by FDA under an Emergency Use Authorization (EUA). This EUA will remain in effect (meaning this test can be used) for the duration of the COVID-19 declaration under Section 564(b)(1) of the Act, 21 U.S.C. section 360bbb-3(b)(1), unless the authorization is terminated or revoked.  Performed at [facility], [place] [address], [place], [place] [address]   Blood Culture (routine x 2)     Status: None   Collection Time: 03/17/2021 11:44 AM   Specimen: BLOOD LEFT HAND  Result Value Ref Range Status   Specimen Description   Final    BLOOD LEFT HAND Performed at [facility] [address], [place], [place] [address]    Special Requests   Final    BOTTLES DRAWN AEROBIC AND ANAEROBIC Blood Culture results may not be optimal due to an inadequate volume of blood received in culture bottles Performed at [facility] [address] [place], [place] [address]    Culture   Final    NO GROWTH 5 DAYS Performed at [facility], [place] [address], [place], [place] [address]    Report Status 04/05/2021 FINAL  Final  MRSA Next Gen by PCR, Nasal     Status: None   Collection Time: 04/01/21 11:11 AM   Specimen: Nasal Mucosa; Nasal Swab  Result Value Ref Range Status   MRSA by PCR Next Gen NOT DETECTED NOT DETECTED Final    Comment: (NOTE) The GeneXpert MRSA Assay (FDA approved for NASAL specimens  only), is one component of a comprehensive MRSA colonization surveillance program. It is not intended to diagnose MRSA infection nor to guide or monitor treatment for MRSA infections. Test performance is not FDA approved in patients less than 37 years old. Performed at [facility], [place] [address] [place], [place] [address]      Radiology Studies: No results found.  Scheduled Meds:  Chlorhexidine Gluconate Cloth  6 each Topical Daily   morphine CONCENTRATE  10 mg Sublingual Q4H   sodium hypochlorite   Irrigation BID   Continuous Infusions:    LOS: 5 days   Cordelia Poche, MD [facility] 04/05/2021, 3:03 PM  If 7PM-7AM, please contact night-coverage [email or web address]

## 2021-04-05 NOTE — Progress Notes (Signed)
PT Cancellation Note  Patient Details Name: Erik Lara MRN: [id number] DOB: 1935/06/30   Cancelled Treatment:    Reason Eval/Treat Not Completed: Other (comment) (pt on comfort measures, will sign off for PT hydrotherapy. dressing changes to be managed by RN staff.)  Gwynneth Albright PT, DPT Acute Rehabilitation Services Office [phone] Pager [phone]

## 2021-04-05 NOTE — Care Management Important Message (Signed)
Important Message  Patient Details IM Letter placed in Patients room. Name: Erik Lara MRN: [id number] Date of Birth: 04-20-1935   Medicare Important Message Given:  Yes     Kerin Salen 04/05/2021, 12:09 PM

## 2021-04-06 DIAGNOSIS — R338 Other retention of urine: Secondary | ICD-10-CM | POA: Diagnosis not present

## 2021-04-06 DIAGNOSIS — G3183 Dementia with Lewy bodies: Secondary | ICD-10-CM | POA: Diagnosis not present

## 2021-04-06 DIAGNOSIS — A419 Sepsis, unspecified organism: Secondary | ICD-10-CM | POA: Diagnosis not present

## 2021-04-06 DIAGNOSIS — Z7189 Other specified counseling: Secondary | ICD-10-CM | POA: Diagnosis not present

## 2021-04-06 DIAGNOSIS — G9341 Metabolic encephalopathy: Secondary | ICD-10-CM | POA: Diagnosis not present

## 2021-04-06 DIAGNOSIS — F0280 Dementia in other diseases classified elsewhere without behavioral disturbance: Secondary | ICD-10-CM | POA: Diagnosis not present

## 2021-04-06 DIAGNOSIS — R7989 Other specified abnormal findings of blood chemistry: Secondary | ICD-10-CM | POA: Diagnosis not present

## 2021-04-06 MED ORDER — GLYCOPYRROLATE 0.2 MG/ML IJ SOLN
0.3000 mg | INTRAMUSCULAR | Status: DC
  Administered 2021-04-06 – 2021-04-21 (×64): 0.3 mg via INTRAVENOUS
  Filled 2021-04-06 (×64): qty 2

## 2021-04-06 NOTE — TOC Progression Note (Signed)
Transition of Care [facility]) - Progression Note    Patient Details  Name: Erik Lara MRN: [id number] Date of Birth: [birth year]  [facility] CM/SW Contact  Leeroy Cha, RN Phone Number: 04/06/2021, 10:56 AM  Clinical Narrative:    Tct-[facility] referral to [facility] made.   Expected Discharge Plan: [place] Barriers to Discharge: Continued Medical Work up  Expected Discharge Plan and Services Expected Discharge Plan: [place]   Discharge Planning Services: CM Consult Post Acute Care Choice: [place] Living arrangements for the past 2 months: [place] ([place])                                       Social Determinants of Health (SDOH) Interventions    Readmission Risk Interventions No flowsheet data found.

## 2021-04-06 NOTE — Progress Notes (Signed)
PROGRESS NOTE    Erik Lara  WNI:[id number] DOB: [birth year] DOA: 03/30/2021 PCP: Dorothyann Peng, NP   Brief Narrative: Erik Lara is 86 y.o. male with a history of hyperlipidemia, lewy body dementia, diabetes mellitus, sacral pressure injury. Patient presented secondary to altered mental status and shortness of breath found to have evidence of sepsis secondary to a sacral decubitus ulcer. Blood cultures significant for bacteroides thetaiotaomicron. General surgery on board for debridement of decubitus ulcer. Patient is now transitioned to comfort measures.   Assessment & Plan:   * Sepsis ([place])- (present on admission) Present on admission. Likely secondary to sacral decubitus ulcer. Procalcitonin of 0.31. Afebrile with leukocytosis. Antibiotics initiated on admission and blood cultures obtained. One set of blood cultures significant for Bacteroides Thetaiotaomicron. Leukocytosis continues improved with change in antibiotics. Patient now transitioned to comfort measures.  Decubitus ulcer of sacral region, unstageable [facility]) Concern that this is a source for infection/sepsis. General surgery consulted. CT pelvis significant for no abscess formation. Started empirically on Vancomycin, Cefepime and Flagyl. Patient developed worsening leukocytosis and continued fevers with subsequent change to meropenem IV. Now transitioned to comfort measures.  Hypernatremia-resolved as of 04/05/2021 Unsure of chronicity. Secondary to poor oral intake. Initial worsening with peak of 152, now resolved with IV fluids. Comfort measures.  Hydronephrosis Likely related to acute urinary retention. Left greater than right. Associated AKI on admission.  AKI (acute kidney injury) (HCC)-resolved as of 04/03/2021 Creatinine of 2.42 on admission. Secondary to acute urinary retention in addition to decreased oral intake. Foley catheter placed and IV fluids given with improved creatinine.  Pressure injury of  skin- (present on admission) Left pretibial, left heel, right heel, medial sacrum, anterior/left ankle, anterior/left knee, posterior/right elbow. Wound care consulted for wound management recommendations.  COVID-19 virus infection- (present on admission) Previously diagnosed on 02/26/2021. Currently asymptomatic. No treatment initiated this admission. Chest x-ray without acute process.  Lewy body dementia ([place])- (present on admission) Patient follows with neurology. Hold Namenda and Aricept  Type 2 diabetes mellitus without complication, without long-term current use of insulin (HCC) Well controlled. Last hemoglobin A1C of 5.7% from 12/22.  Hyperlipidemia- (present on admission) Hold home Crestor  Goals of care, counseling/discussion While at SNF, patient was referred to hospice; this was apparently not known by family at the time and they have not accepted hospice care. Patient is currently unable to eat/drink secondary to severe encephalopathy. Patient also shows signs of dehydration complicated by poor albumin/third spacing.  1/23: Transition to full comfort measures with plan to discharge to hospice facility.  Protein-calorie malnutrition, severe Dietitian consulted. Patient is currently NPO secondary to mental status.  Acute urinary retention Unknown etiology. Bladder scan in the ED was significant for 1200 mL of measured urine. Foley catheter placed. Urine culture with no growth. Associated left moderate and right mild hydronephrosis. -Continue foley catheter for comfort  Acute metabolic encephalopathy Possibly multifactorial in setting of underlying dementia, hypernatremia, acute infection. CT head unremarkable for acute process and EEG significant for evidence of diffuse cerebral dysfunction but no evidence of seizure activity. Some improvement with some ability to somewhat follow simple commands.  Abnormal TSH- (present on admission) Recent TSH from 12/18 of 0.143 with free  T4 of 1.39 (elevated) and T3 of 57 (low). Concerning for hyperthyroidism. Repeat TSH and free T4 are normal. Comfort measures.     DVT prophylaxis: Comfort measures Code Status:   Code Status: DNR Family Communication: None at bedside Disposition Plan: Discharge to hospice facility  Consultants:  General surgery Palliative care medicine  Procedures:  HYDROTHERAPY  Antimicrobials: Vancomycin Cefepime Flagyl    Subjective: Non-verbal  Objective: Vitals:   04/05/21 0420 04/05/21 1205 04/05/21 2222 04/06/21 1452  BP: 105/63 (!) 104/50 (!) 135/95 (!) 107/55  Pulse: 63 (!) 54 77 91  Resp: 18 18 20 14   Temp: 97.9 F (36.6 C) 97.8 F (36.6 C) 98 F (36.7 C) 97.6 F (36.4 C)  TempSrc:  Oral Oral Oral  SpO2: 97% 97% 97% 94%  Weight:      Height:        Intake/Output Summary (Last 24 hours) at 04/06/2021 1525 Last data filed at 04/06/2021 0800 Gross per 24 hour  Intake 0 ml  Output 650 ml  Net -650 ml    Filed Weights   03/26/2021 0935 04/13/2021 2217 04/03/21 1513  Weight: 81.4 kg 71.9 kg 73 kg    Examination:  General: No distress Neuro: Obtunded   Data Reviewed: I have personally reviewed following labs and imaging studies  CBC Lab Results  Component Value Date   WBC 17.8 (H) 04/05/2021   RBC 3.39 (L) 04/05/2021   HGB 9.9 (L) 04/05/2021   HCT 32.2 (L) 04/05/2021   MCV 95.0 04/05/2021   MCH 29.2 04/05/2021   PLT 164 04/05/2021   MCHC 30.7 04/05/2021   RDW 14.0 04/05/2021   LYMPHSABS 0.5 (L) 03/19/2021   MONOABS 0.7 03/24/2021   EOSABS 0.0 03/19/2021   BASOSABS 0.1 35/32/9924     Last metabolic panel Lab Results  Component Value Date   NA 144 04/05/2021   K 3.5 04/05/2021   CL 119 (H) 04/05/2021   CO2 21 (L) 04/05/2021   BUN 28 (H) 04/05/2021   CREATININE 0.54 (L) 04/05/2021   GLUCOSE 136 (H) 04/05/2021   GFRNONAA >60 04/05/2021   GFRAA 123 02/05/2008   CALCIUM 8.3 (L) 04/05/2021   PHOS 3.1 03/03/2021   PROT 5.4 (L) 04/01/2021    ALBUMIN 2.0 (L) 04/01/2021   BILITOT 1.0 04/01/2021   ALKPHOS 78 04/01/2021   AST 25 04/01/2021   ALT 31 04/01/2021   ANIONGAP 4 (L) 04/05/2021    CBG (last 3)  Recent Labs    04/04/21 0459 04/05/21 0451 04/05/21 0506  GLUCAP 125* 131* 125*      GFR: Estimated Creatinine Clearance: 71 mL/min (A) (by C-G formula based on SCr of 0.54 mg/dL (L)).  Coagulation Profile: Recent Labs  Lab 03/28/2021 0840 04/01/21 0333  INR 1.3* 1.4*     Recent Results (from the past 240 hour(s))  Blood Culture (routine x 2)     Status: Abnormal   Collection Time: 03/30/2021  8:40 AM   Specimen: BLOOD  Result Value Ref Range Status   Specimen Description   Final    BLOOD RIGHT ARM Performed at [facility] [address], [place], [place] [address]    Special Requests   Final    BOTTLES DRAWN AEROBIC AND ANAEROBIC Blood Culture adequate volume Performed at [facility] [address], [place], [place] [address]    Culture  Setup Time   Final    GRAM NEGATIVE RODS ANAEROBIC BOTTLE ONLY CRITICAL RESULT CALLED TO, READ BACK BY AND VERIFIED WITH: PHARMD CHRISTY, [phone] FCP    Culture (A)  Final    BACTEROIDES THETAIOTAOMICRON BETA LACTAMASE POSITIVE Performed at [facility], [place] [address], [place], [place] [address]    Report Status 04/04/2021 FINAL  Final  Blood  Culture ID Panel (Reflexed)     Status: None   Collection Time: 04/12/2021  8:40 AM  Result Value Ref Range Status   Enterococcus faecalis NOT DETECTED NOT DETECTED Final   Enterococcus Faecium NOT DETECTED NOT DETECTED Final   Listeria monocytogenes NOT DETECTED NOT DETECTED Final   Staphylococcus species NOT DETECTED NOT DETECTED Final   Staphylococcus aureus (BCID) NOT DETECTED NOT DETECTED Final   Staphylococcus epidermidis NOT DETECTED NOT DETECTED Final   Staphylococcus lugdunensis NOT DETECTED NOT DETECTED Final   Streptococcus species NOT DETECTED NOT DETECTED Final    Streptococcus agalactiae NOT DETECTED NOT DETECTED Final   Streptococcus pneumoniae NOT DETECTED NOT DETECTED Final   Streptococcus pyogenes NOT DETECTED NOT DETECTED Final   A.calcoaceticus-baumannii NOT DETECTED NOT DETECTED Final   Bacteroides fragilis NOT DETECTED NOT DETECTED Final   Enterobacterales NOT DETECTED NOT DETECTED Final   Enterobacter cloacae complex NOT DETECTED NOT DETECTED Final   Escherichia coli NOT DETECTED NOT DETECTED Final   Klebsiella aerogenes NOT DETECTED NOT DETECTED Final   Klebsiella oxytoca NOT DETECTED NOT DETECTED Final   Klebsiella pneumoniae NOT DETECTED NOT DETECTED Final   Proteus species NOT DETECTED NOT DETECTED Final   Salmonella species NOT DETECTED NOT DETECTED Final   Serratia marcescens NOT DETECTED NOT DETECTED Final   Haemophilus influenzae NOT DETECTED NOT DETECTED Final   Neisseria meningitidis NOT DETECTED NOT DETECTED Final   Pseudomonas aeruginosa NOT DETECTED NOT DETECTED Final   Stenotrophomonas maltophilia NOT DETECTED NOT DETECTED Final   Candida albicans NOT DETECTED NOT DETECTED Final   Candida auris NOT DETECTED NOT DETECTED Final   Candida glabrata NOT DETECTED NOT DETECTED Final   Candida krusei NOT DETECTED NOT DETECTED Final   Candida parapsilosis NOT DETECTED NOT DETECTED Final   Candida tropicalis NOT DETECTED NOT DETECTED Final   Cryptococcus neoformans/gattii NOT DETECTED NOT DETECTED Final    Comment: Performed at [facility], [address] [place], [place] [address]  Urine Culture     Status: None   Collection Time: 04/09/2021  8:47 AM   Specimen: In/Out Cath Urine  Result Value Ref Range Status   Specimen Description   Final    IN/OUT CATH URINE Performed at [facility] [address], [place], [place] [address]    Special Requests   Final    NONE Performed at [facility], [place] [address] [place], [place] [address]    Culture   Final    NO GROWTH Performed  at [facility], [place] [address], [place], [place] [address]    Report Status 04/01/2021 FINAL  Final  Resp Panel by RT-PCR (Flu A&B, Covid) Nasopharyngeal Swab     Status: Abnormal   Collection Time: 04/12/2021  8:49 AM   Specimen: Nasopharyngeal Swab; Nasopharyngeal(NP) swabs in vial transport medium  Result Value Ref Range Status   SARS Coronavirus 2 by RT PCR POSITIVE (A) NEGATIVE Final    Comment: (NOTE) SARS-CoV-2 target nucleic acids are DETECTED.  The SARS-CoV-2 RNA is generally detectable in upper respiratory specimens during the acute phase of infection. Positive results are indicative of the presence of the identified virus, but do not rule out bacterial infection or co-infection with other pathogens not detected by the test. Clinical correlation with patient history and other diagnostic information is necessary to determine patient infection status. The expected result is Negative.  Fact Sheet for Patients: EntrepreneurPulse.com.au  Fact Sheet for Healthcare Providers: IncredibleEmployment.be  This test  is not yet approved or cleared by the [place] and  has been authorized for detection and/or diagnosis of SARS-CoV-2 by FDA under an Emergency Use Authorization (EUA).  This EUA will remain in effect (meaning this test can be used) for the duration of  the COVID-19 declaration under Section 564(b)(1) of the A ct, 21 U.S.C. section 360bbb-3(b)(1), unless the authorization is terminated or revoked sooner.     Influenza A by PCR NEGATIVE NEGATIVE Final   Influenza B by PCR NEGATIVE NEGATIVE Final    Comment: (NOTE) The Xpert Xpress SARS-CoV-2/FLU/RSV plus assay is intended as an aid in the diagnosis of influenza from Nasopharyngeal swab specimens and should not be used as a sole basis for treatment. Nasal washings and aspirates are unacceptable for Xpert Xpress SARS-CoV-2/FLU/RSV testing.  Fact Sheet for  Patients: EntrepreneurPulse.com.au  Fact Sheet for Healthcare Providers: IncredibleEmployment.be  This test is not yet approved or cleared by the [place] FDA and has been authorized for detection and/or diagnosis of SARS-CoV-2 by FDA under an Emergency Use Authorization (EUA). This EUA will remain in effect (meaning this test can be used) for the duration of the COVID-19 declaration under Section 564(b)(1) of the Act, 21 U.S.C. section 360bbb-3(b)(1), unless the authorization is terminated or revoked.  Performed at [facility], [place] [address] [place], [place] [address]   Blood Culture (routine x 2)     Status: None   Collection Time: 03/28/2021 11:44 AM   Specimen: BLOOD LEFT HAND  Result Value Ref Range Status   Specimen Description   Final    BLOOD LEFT HAND Performed at [facility] [address], [place], [place] [address]    Special Requests   Final    BOTTLES DRAWN AEROBIC AND ANAEROBIC Blood Culture results may not be optimal due to an inadequate volume of blood received in culture bottles Performed at [facility] [address] [place], [place] [address]    Culture   Final    NO GROWTH 5 DAYS Performed at [facility], [place] [address] [place], [place] [address]    Report Status 04/05/2021 FINAL  Final  MRSA Next Gen by PCR, Nasal     Status: None   Collection Time: 04/01/21 11:11 AM   Specimen: Nasal Mucosa; Nasal Swab  Result Value Ref Range Status   MRSA by PCR Next Gen NOT DETECTED NOT DETECTED Final    Comment: (NOTE) The GeneXpert MRSA Assay (FDA approved for NASAL specimens only), is one component of a comprehensive MRSA colonization surveillance program. It is not intended to diagnose MRSA infection nor to guide or monitor treatment for MRSA infections. Test performance is not FDA approved in patients less than 72 years old. Performed at [facility], [place] [address] [place], [place] [address]      Radiology Studies: No results found.  Scheduled Meds:  Chlorhexidine Gluconate Cloth  6 each Topical Daily   glycopyrrolate  0.3 mg Intravenous Q4H   morphine CONCENTRATE  10 mg Sublingual Q4H   sodium hypochlorite   Irrigation BID   Continuous Infusions:    LOS: 6 days   Cordelia Poche, MD [facility] 04/06/2021, 3:25 PM  If 7PM-7AM, please contact night-coverage [email or web address]

## 2021-04-06 NOTE — Progress Notes (Addendum)
Manufacturing engineer [facility]) Hospital Liaison note.      Received request from [place] for family interest in [facility]. Spoke with patient's wife to confirm interest and answer questions.   Eligibility confirmed but patient will not be able to transfer prior to 1/27 due to positive Covid test on 1/16. Chief Strategy Officer for Covid is 10 days post positive test and asymptomatic.   A Please do not hesitate to call with questions.    Thank you,    Farrel Gordon, RN, [place] (listed on [facility] under [place])     [phone]

## 2021-04-06 NOTE — Progress Notes (Signed)
Manufacturing engineer [facility]) Hospital Liaison Note  Plan for transfer to [facility] tomorrow. Patient initially tested positive on 12/16 and has completed his 10 day isolation period, making him eligible for transfer.   [place] liaison will follow up tomorrow. Please call with any questions or concerns.   Roselee Nova, LCSW [facility] Liaison (listed on [facility] under [place])   [phone]

## 2021-04-07 DIAGNOSIS — F0280 Dementia in other diseases classified elsewhere without behavioral disturbance: Secondary | ICD-10-CM | POA: Diagnosis not present

## 2021-04-07 DIAGNOSIS — Z7189 Other specified counseling: Secondary | ICD-10-CM | POA: Diagnosis not present

## 2021-04-07 DIAGNOSIS — G3183 Dementia with Lewy bodies: Secondary | ICD-10-CM | POA: Diagnosis not present

## 2021-04-07 DIAGNOSIS — G9341 Metabolic encephalopathy: Secondary | ICD-10-CM | POA: Diagnosis not present

## 2021-04-07 DIAGNOSIS — N179 Acute kidney failure, unspecified: Secondary | ICD-10-CM | POA: Diagnosis not present

## 2021-04-07 NOTE — Progress Notes (Signed)
Palliative Care Progress Note  Patient is approaching EOL. Periods of Apnea, no distress. SBP at 0500 was 80-90. Saturations 80-90s. Family is at bedside. I advised against transport to [facility], anticipate hospital death within 24 hours based on my assessment this AM. Will continue to provide care focused on relief of suffering and support his family. Will offer Spiritual Care.  Lane Hacker, DO Palliative Medicine  Time: 41min

## 2021-04-07 NOTE — Progress Notes (Signed)
Palliative Care Progress Note  Erik Lara is mostly unresponsive. He appears comfortable, no signs of distress. Had issues earlier with secretions but responded well to [place] and repositioning. He is on scheduled SL morphine, has gotten one dose of PRN morphine in last 24 hours. His vital signs have deteriorated slightly-sats 94% SBP 107 HR90's. Respiratons are even and regular- no periods of apnea.  Family at bedside.  We discussed his stability for transport to [facility] and will need to reassess tomorrow- he may have a hospital death. Son had questions about hydration and I provided education about hydration at EOL.   Maintain Comfort Measures Only.  Reassess for stability to transfer. Prognosis is hours-days.  Erik Hacker, DO Palliative Medicine  Time: 25 minutes

## 2021-04-07 NOTE — Progress Notes (Signed)
PROGRESS NOTE    Erik Lara  LPF:[id number] DOB: 1935/03/28 DOA: 03/30/2021 PCP: Dorothyann Peng, NP    Brief Narrative:  86 y.o. male with a history of hyperlipidemia, lewy body dementia, diabetes mellitus, sacral pressure injury. Patient presented secondary to altered mental status and shortness of breath found to have evidence of sepsis secondary to a sacral decubitus ulcer. Blood cultures significant for bacteroides thetaiotaomicron. General surgery on board for debridement of decubitus ulcer. Patient is now transitioned to comfort measures.  Assessment & Plan:   * Sepsis ([place])- (present on admission) Present on admission. Likely secondary to sacral decubitus ulcer. Procalcitonin of 0.31. Afebrile with leukocytosis. Antibiotics were initially started on admission and blood cultures obtained. One set of blood cultures significant for Bacteroides Thetaiotaomicron.  -Pt now transitioned to comfort measures.   Decubitus ulcer of sacral region, unstageable [facility]) Concern that this is a source for infection/sepsis. General surgery consulted. CT pelvis significant for no abscess formation. Started empirically on Vancomycin, Cefepime and Flagyl. Patient developed worsening leukocytosis and continued fevers with subsequent change to meropenem IV.  -Pt has since been transitioned to comfort measures.   Hypernatremia-resolved as of 04/05/2021 Unsure of chronicity. Secondary to poor oral intake. Initial worsening with peak of 152, later improved with IV fluids. Comfort measures.   Hydronephrosis Likely related to acute urinary retention. Left greater than right. Associated AKI on admission.   AKI (acute kidney injury) ([place])- Creatinine of 2.42 on admission. Secondary to acute urinary retention in addition to decreased oral intake. Foley catheter placed and IV fluids given with improved creatinine. -continue foley cath for comfort   Pressure injury of skin- (present on admission) Left  pretibial, left heel, right heel, medial sacrum, anterior/left ankle, anterior/left knee, posterior/right elbow.  -Was seen by wound care this visit   COVID-19 virus infection- (present on admission) Previously diagnosed on 02/26/2021. Currently asymptomatic. No treatment initiated this admission. Chest x-ray without acute process.   Lewy body dementia ([place])- (present on admission) Patient follows with neurology. Held [place] and Aricept this visit   Type 2 diabetes mellitus without complication, without long-term current use of insulin ([place]) Well controlled. Last hemoglobin A1C of 5.7% from 12/22.   Hyperlipidemia- (present on admission) Hold home Crestor   Goals of care, counseling/discussion While at SNF, patient was referred to hospice; this was apparently not known by family at the time and they have not accepted hospice care. Patient remains unable to eat/drink secondary to severe encephalopathy. Patient also shows signs of dehydration complicated by poor albumin/third spacing.   1/23: Transitioned to full comfort measures with plan to discharge to hospice facility.   Protein-calorie malnutrition, severe Dietitian consulted. Patient is currently NPO secondary to mental status.   Acute urinary retention Unknown etiology. Bladder scan in the ED was significant for 1200 mL of measured urine. Foley catheter placed. Urine culture with no growth. Associated left moderate and right mild hydronephrosis. -Continue foley catheter for comfort   Acute metabolic encephalopathy Possibly multifactorial in setting of underlying dementia, hypernatremia, acute infection. CT head unremarkable for acute process and EEG significant for evidence of diffuse cerebral dysfunction but no evidence of seizure activity.  -Now focus on comfort measures   Abnormal TSH- (present on admission) Recent TSH from 12/18 of 0.143 with free T4 of 1.39 (elevated) and T3 of 57 (low). Concerning for hyperthyroidism.  Repeat TSH and free T4 are normal. Comfort measures.    DVT prophylaxis: Comfort care Code Status: DNR Family Communication: Pt in room, family  is at bedside  Status is: Inpatient  Remains inpatient appropriate because: Severity of illness    Consultants:  Palliative Care General Surgery  Procedures:  Hydrotherapy  Antimicrobials: Anti-infectives (From admission, onward)    Start     Dose/Rate Route Frequency Ordered Stop   04/05/21 1000  vancomycin (VANCOREADY) IVPB 1500 mg/300 mL  Status:  Discontinued        1,500 mg 150 mL/hr over 120 Minutes Intravenous Every 24 hours 04/04/21 1117 04/05/21 1420   04/03/21 1000  vancomycin (VANCOREADY) IVPB 1250 mg/250 mL  Status:  Discontinued        1,250 mg 166.7 mL/hr over 90 Minutes Intravenous Every 24 hours 04/02/21 1204 04/04/21 1117   04/03/21 1000  meropenem (MERREM) 1 g in sodium chloride 0.9 % 100 mL IVPB  Status:  Discontinued        1 g 200 mL/hr over 30 Minutes Intravenous Every 8 hours 04/03/21 0838 04/05/21 1420   04/02/21 1200  vancomycin (VANCOREADY) IVPB 1500 mg/300 mL  Status:  Discontinued        1,500 mg 150 mL/hr over 120 Minutes Intravenous Every 48 hours 04/05/2021 1308 04/01/21 0920   04/01/21 1200  vancomycin (VANCOREADY) IVPB 1000 mg/200 mL  Status:  Discontinued        1,000 mg 200 mL/hr over 60 Minutes Intravenous Daily 04/01/21 0920 04/02/21 1204   04/01/21 1000  ceFEPIme (MAXIPIME) 2 g in sodium chloride 0.9 % 100 mL IVPB  Status:  Discontinued        2 g 200 mL/hr over 30 Minutes Intravenous Every 12 hours 04/01/21 0924 04/03/21 0939   04/09/2021 1400  ceFEPIme (MAXIPIME) 2 g in sodium chloride 0.9 % 100 mL IVPB  Status:  Discontinued        2 g 200 mL/hr over 30 Minutes Intravenous Every 24 hours 04/05/2021 1305 04/01/21 0924   04/07/2021 1400  metroNIDAZOLE (FLAGYL) IVPB 500 mg  Status:  Discontinued        500 mg 100 mL/hr over 60 Minutes Intravenous Every 12 hours 03/25/2021 1305 04/03/21 1238   03/17/2021  1130  vancomycin (VANCOREADY) IVPB 1500 mg/300 mL        1,500 mg 150 mL/hr over 120 Minutes Intravenous  Once 03/22/2021 1124 04/03/2021 1432   03/29/2021 1115  cefTRIAXone (ROCEPHIN) 2 g in sodium chloride 0.9 % 100 mL IVPB        2 g 200 mL/hr over 30 Minutes Intravenous  Once 04/05/2021 1107 03/14/2021 1204       Subjective: Unable to assess given mentation  Objective: Vitals:   04/05/21 1205 04/05/21 2222 04/06/21 1452 04/07/21 0528  BP: (!) 104/50 (!) 135/95 (!) 107/55 (!) 98/51  Pulse: (!) 54 77 91 89  Resp: 18 20 14 13   Temp: 97.8 F (36.6 C) 98 F (36.7 C) 97.6 F (36.4 C) 98.1 F (36.7 C)  TempSrc: Oral Oral Oral Oral  SpO2: 97% 97% 94% 98%  Weight:      Height:        Intake/Output Summary (Last 24 hours) at 04/07/2021 1607 Last data filed at 04/07/2021 0600 Gross per 24 hour  Intake 0 ml  Output 475 ml  Net -475 ml   Filed Weights   03/22/2021 0935 04/08/2021 2217 04/03/21 1513  Weight: 81.4 kg 71.9 kg 73 kg    Examination: General exam: Asleep, laying in bed, in nad Respiratory system: Normal respiratory effort, no audible wheezing Cardiovascular system: perfused, no notable jvd Gastrointestinal system:  Soft, nondistended Central nervous system: no seizures or termors Extremities: Perfused, no clubbing Skin: Normal skin turgor, no notable skin lesions seen Psychiatry: unable to assess given mentation   Data Reviewed: I have personally reviewed following labs and imaging studies  CBC: Recent Labs  Lab 03/22/2021 2216 04/02/21 0330 04/03/21 0357 04/04/21 0355 04/05/21 0352  WBC 27.4* 30.6* 39.8* 29.8* 17.8*  HGB 11.4* 10.8* 10.6* 9.4* 9.9*  HCT 36.4* 34.9* 35.5* 30.4* 32.2*  MCV 95.3 96.4 98.1 96.2 95.0  PLT 229 219 195 162 778   Basic Metabolic Panel: Recent Labs  Lab 04/01/21 0333 04/02/21 0330 04/03/21 0357 04/04/21 0355 04/04/21 0649 04/05/21 0352  NA 151* 152* 150* 146*  --  144  K 3.8 3.0* 3.4* 2.9*  --  3.5  CL 116* 123* 122* 120*  --   119*  CO2 23 21* 21* 21*  --  21*  GLUCOSE 107* 150* 159* 141*  --  136*  BUN 71* 60* 45* 35*  --  28*  CREATININE 1.35* 1.21 1.01 0.82  --  0.54*  CALCIUM 9.2 8.7* 8.7* 8.3*  --  8.3*  MG  --  2.3  --   --  1.6*  --    GFR: Estimated Creatinine Clearance: 71 mL/min (A) (by C-G formula based on SCr of 0.54 mg/dL (L)). Liver Function Tests: Recent Labs  Lab 04/01/21 0333  AST 25  ALT 31  ALKPHOS 78  BILITOT 1.0  PROT 5.4*  ALBUMIN 2.0*   No results for input(s): LIPASE, AMYLASE in the last 168 hours. No results for input(s): AMMONIA in the last 168 hours. Coagulation Profile: Recent Labs  Lab 04/01/21 0333  INR 1.4*   Cardiac Enzymes: No results for input(s): CKTOTAL, CKMB, CKMBINDEX, TROPONINI in the last 168 hours. BNP (last 3 results) No results for input(s): PROBNP in the last 8760 hours. HbA1C: No results for input(s): HGBA1C in the last 72 hours. CBG: Recent Labs  Lab 04/03/21 0607 04/03/21 1710 04/04/21 0459 04/05/21 0451 04/05/21 0506  GLUCAP 143* 131* 125* 131* 125*   Lipid Profile: No results for input(s): CHOL, HDL, LDLCALC, TRIG, CHOLHDL, LDLDIRECT in the last 72 hours. Thyroid Function Tests: No results for input(s): TSH, T4TOTAL, FREET4, T3FREE, THYROIDAB in the last 72 hours. Anemia Panel: No results for input(s): VITAMINB12, FOLATE, FERRITIN, TIBC, IRON, RETICCTPCT in the last 72 hours. Sepsis Labs: Recent Labs  Lab 03/30/2021 2216 04/01/21 0333 04/03/21 0357 04/04/21 0355  PROCALCITON  --  0.31 0.72 0.55  LATICACIDVEN 2.6* 2.3*  --   --     Recent Results (from the past 240 hour(s))  Blood Culture (routine x 2)     Status: Abnormal   Collection Time: 04/13/2021  8:40 AM   Specimen: BLOOD  Result Value Ref Range Status   Specimen Description   Final    BLOOD RIGHT ARM Performed at [facility] [address] [place], [place] [address]    Special Requests   Final    BOTTLES DRAWN AEROBIC AND ANAEROBIC Blood Culture  adequate volume Performed at [facility] [address], [place], [place] [address]    Culture  Setup Time   Final    GRAM NEGATIVE RODS ANAEROBIC BOTTLE ONLY CRITICAL RESULT CALLED TO, READ BACK BY AND VERIFIED WITH: PHARMD CHRISTY, [phone] FCP    Culture (A)  Final    BACTEROIDES THETAIOTAOMICRON BETA LACTAMASE POSITIVE Performed at [facility], [place],  [place] [address]    Report Status 04/04/2021 FINAL  Final  Blood Culture ID Panel (Reflexed)     Status: None   Collection Time: 03/30/2021  8:40 AM  Result Value Ref Range Status   Enterococcus faecalis NOT DETECTED NOT DETECTED Final   Enterococcus Faecium NOT DETECTED NOT DETECTED Final   Listeria monocytogenes NOT DETECTED NOT DETECTED Final   Staphylococcus species NOT DETECTED NOT DETECTED Final   Staphylococcus aureus (BCID) NOT DETECTED NOT DETECTED Final   Staphylococcus epidermidis NOT DETECTED NOT DETECTED Final   Staphylococcus lugdunensis NOT DETECTED NOT DETECTED Final   Streptococcus species NOT DETECTED NOT DETECTED Final   Streptococcus agalactiae NOT DETECTED NOT DETECTED Final   Streptococcus pneumoniae NOT DETECTED NOT DETECTED Final   Streptococcus pyogenes NOT DETECTED NOT DETECTED Final   A.calcoaceticus-baumannii NOT DETECTED NOT DETECTED Final   Bacteroides fragilis NOT DETECTED NOT DETECTED Final   Enterobacterales NOT DETECTED NOT DETECTED Final   Enterobacter cloacae complex NOT DETECTED NOT DETECTED Final   Escherichia coli NOT DETECTED NOT DETECTED Final   Klebsiella aerogenes NOT DETECTED NOT DETECTED Final   Klebsiella oxytoca NOT DETECTED NOT DETECTED Final   Klebsiella pneumoniae NOT DETECTED NOT DETECTED Final   Proteus species NOT DETECTED NOT DETECTED Final   Salmonella species NOT DETECTED NOT DETECTED Final   Serratia marcescens NOT DETECTED NOT DETECTED Final   Haemophilus influenzae NOT DETECTED NOT DETECTED Final   Neisseria  meningitidis NOT DETECTED NOT DETECTED Final   Pseudomonas aeruginosa NOT DETECTED NOT DETECTED Final   Stenotrophomonas maltophilia NOT DETECTED NOT DETECTED Final   Candida albicans NOT DETECTED NOT DETECTED Final   Candida auris NOT DETECTED NOT DETECTED Final   Candida glabrata NOT DETECTED NOT DETECTED Final   Candida krusei NOT DETECTED NOT DETECTED Final   Candida parapsilosis NOT DETECTED NOT DETECTED Final   Candida tropicalis NOT DETECTED NOT DETECTED Final   Cryptococcus neoformans/gattii NOT DETECTED NOT DETECTED Final    Comment: Performed at [facility], [address], [place], [place] [address]  Urine Culture     Status: None   Collection Time: 04/04/2021  8:47 AM   Specimen: In/Out Cath Urine  Result Value Ref Range Status   Specimen Description   Final    IN/OUT CATH URINE Performed at [facility] [address] [place], [place] [address]    Special Requests   Final    NONE Performed at [facility], [place] [address] [place], [place] [address]    Culture   Final    NO GROWTH Performed at [facility], [place] [address] [place], [place] [address]    Report Status 04/01/2021 FINAL  Final  Resp Panel by RT-PCR (Flu A&B, Covid) Nasopharyngeal Swab     Status: Abnormal   Collection Time: 03/24/2021  8:49 AM   Specimen: Nasopharyngeal Swab; Nasopharyngeal(NP) swabs in vial transport medium  Result Value Ref Range Status   SARS Coronavirus 2 by RT PCR POSITIVE (A) NEGATIVE Final    Comment: (NOTE) SARS-CoV-2 target nucleic acids are DETECTED.  The SARS-CoV-2 RNA is generally detectable in upper respiratory specimens during the acute phase of infection. Positive results are indicative of the presence of the identified virus, but do not rule out bacterial infection or co-infection with other pathogens not detected by the test. Clinical correlation with patient history and other diagnostic information is necessary to  determine patient infection status. The expected result is Negative.  Fact Sheet  for Patients: EntrepreneurPulse.com.au  Fact Sheet for Healthcare Providers: IncredibleEmployment.be  This test is not yet approved or cleared by the [place] FDA and  has been authorized for detection and/or diagnosis of SARS-CoV-2 by FDA under an Emergency Use Authorization (EUA).  This EUA will remain in effect (meaning this test can be used) for the duration of  the COVID-19 declaration under Section 564(b)(1) of the A ct, 21 U.S.C. section 360bbb-3(b)(1), unless the authorization is terminated or revoked sooner.     Influenza A by PCR NEGATIVE NEGATIVE Final   Influenza B by PCR NEGATIVE NEGATIVE Final    Comment: (NOTE) The Xpert Xpress SARS-CoV-2/FLU/RSV plus assay is intended as an aid in the diagnosis of influenza from Nasopharyngeal swab specimens and should not be used as a sole basis for treatment. Nasal washings and aspirates are unacceptable for Xpert Xpress SARS-CoV-2/FLU/RSV testing.  Fact Sheet for Patients: EntrepreneurPulse.com.au  Fact Sheet for Healthcare Providers: IncredibleEmployment.be  This test is not yet approved or cleared by the [place] FDA and has been authorized for detection and/or diagnosis of SARS-CoV-2 by FDA under an Emergency Use Authorization (EUA). This EUA will remain in effect (meaning this test can be used) for the duration of the COVID-19 declaration under Section 564(b)(1) of the Act, 21 U.S.C. section 360bbb-3(b)(1), unless the authorization is terminated or revoked.  Performed at [facility], [place] [address], [place], [place] [address]   Blood Culture (routine x 2)     Status: None   Collection Time: 03/30/2021 11:44 AM   Specimen: BLOOD LEFT HAND  Result Value Ref Range Status   Specimen Description   Final    BLOOD LEFT HAND Performed at  [facility] [address] [place], [place] [address]    Special Requests   Final    BOTTLES DRAWN AEROBIC AND ANAEROBIC Blood Culture results may not be optimal due to an inadequate volume of blood received in culture bottles Performed at [facility] [address], [place], [place] [address]    Culture   Final    NO GROWTH 5 DAYS Performed at [facility], [place] [address] [place], [place] [address]    Report Status 04/05/2021 FINAL  Final  MRSA Next Gen by PCR, Nasal     Status: None   Collection Time: 04/01/21 11:11 AM   Specimen: Nasal Mucosa; Nasal Swab  Result Value Ref Range Status   MRSA by PCR Next Gen NOT DETECTED NOT DETECTED Final    Comment: (NOTE) The GeneXpert MRSA Assay (FDA approved for NASAL specimens only), is one component of a comprehensive MRSA colonization surveillance program. It is not intended to diagnose MRSA infection nor to guide or monitor treatment for MRSA infections. Test performance is not FDA approved in patients less than 12 years old. Performed at [facility], [place] [address] [place], [place] [address]      Radiology Studies: No results found.  Scheduled Meds:  Chlorhexidine Gluconate Cloth  6 each Topical Daily   glycopyrrolate  0.3 mg Intravenous Q4H   morphine CONCENTRATE  10 mg Sublingual Q4H   sodium hypochlorite   Irrigation BID   Continuous Infusions:   LOS: 7 days   Marylu Lund, MD [facility] Pager On Amion  If 7PM-7AM, please contact night-coverage 04/07/2021, 4:07 PM

## 2021-04-07 NOTE — Progress Notes (Signed)
Chaplain provided support to Erik Lara and her sons, [name] and [name].  They are having some family time with him and chaplain will check back on them at a later time.  [place], [place] Pager, [phone] 3:54 PM

## 2021-04-08 DIAGNOSIS — G9341 Metabolic encephalopathy: Secondary | ICD-10-CM | POA: Diagnosis not present

## 2021-04-08 DIAGNOSIS — R338 Other retention of urine: Secondary | ICD-10-CM | POA: Diagnosis not present

## 2021-04-08 MED ORDER — MORPHINE SULFATE (PF) 2 MG/ML IV SOLN
2.0000 mg | INTRAVENOUS | Status: DC
  Administered 2021-04-08 – 2021-04-12 (×26): 2 mg via INTRAVENOUS
  Filled 2021-04-08 (×26): qty 1

## 2021-04-08 NOTE — Progress Notes (Signed)
PROGRESS NOTE    Erik Lara  BZJ:[id number] DOB: 25-Jul-1935 DOA: 03/21/2021 PCP: Dorothyann Peng, NP    Brief Narrative:  86 y.o. male with a history of hyperlipidemia, lewy body dementia, diabetes mellitus, sacral pressure injury. Patient presented secondary to altered mental status and shortness of breath found to have evidence of sepsis secondary to a sacral decubitus ulcer. Blood cultures significant for bacteroides thetaiotaomicron. General surgery on board for debridement of decubitus ulcer. Patient is now transitioned to comfort measures.  Assessment & Plan:   * Sepsis ([place])- (present on admission) Present on admission. Likely secondary to sacral decubitus ulcer. Procalcitonin of 0.31. Afebrile with leukocytosis. Antibiotics were initially started on admission and blood cultures obtained. One set of blood cultures significant for Bacteroides Thetaiotaomicron.  -Now on comfort measures.   Decubitus ulcer of sacral region, unstageable [facility]) Concern that this is a source for infection/sepsis. General surgery consulted. CT pelvis significant for no abscess formation. Started empirically on Vancomycin, Cefepime and Flagyl. Patient developed worsening leukocytosis and continued fevers with subsequent change to meropenem IV.  -Pt has since been transitioned to comfort measures.   Hypernatremia-resolved as of 04/05/2021 Unsure of chronicity. Secondary to poor oral intake. Initial worsening with peak of 152, later improved with IV fluids. Comfort measures.   Hydronephrosis Likely related to acute urinary retention. Left greater than right. Associated AKI on admission.   AKI (acute kidney injury) ([place])- Creatinine of 2.42 on admission. Secondary to acute urinary retention in addition to decreased oral intake. Foley catheter placed and IV fluids given with improved creatinine. -continue foley cath for comfort   Pressure injury of skin- (present on admission) Left pretibial, left heel,  right heel, medial sacrum, anterior/left ankle, anterior/left knee, posterior/right elbow.  -Was seen by wound care this visit   COVID-19 virus infection- (present on admission) Previously diagnosed on 02/26/2021. Currently asymptomatic. No treatment initiated this admission. Chest x-ray without acute process.   Lewy body dementia ([place])- (present on admission) Patient follows with neurology. Held [place] and Aricept this visit   Type 2 diabetes mellitus without complication, without long-term current use of insulin ([place]) Well controlled. Last hemoglobin A1C of 5.7% from 12/22.   Hyperlipidemia- (present on admission) Hold home Crestor   Goals of care, counseling/discussion While at SNF, patient was referred to hospice; this was apparently not known by family at the time and they have not accepted hospice care. Patient remains unable to eat/drink secondary to severe encephalopathy. Patient also shows signs of dehydration complicated by poor albumin/third spacing.   1/23: Transitioned to full comfort measures with plan to discharge to hospice facility.   Protein-calorie malnutrition, severe Dietitian consulted. Patient is currently NPO secondary to mental status.   Acute urinary retention Unknown etiology. Bladder scan in the ED was significant for 1200 mL of measured urine. Foley catheter placed. Urine culture with no growth. Associated left moderate and right mild hydronephrosis. -Continue foley catheter for comfort   Acute metabolic encephalopathy Possibly multifactorial in setting of underlying dementia, hypernatremia, acute infection. CT head unremarkable for acute process and EEG significant for evidence of diffuse cerebral dysfunction but no evidence of seizure activity.  -Now focus on comfort measures   Abnormal TSH- (present on admission) Recent TSH from 12/18 of 0.143 with free T4 of 1.39 (elevated) and T3 of 57 (low). Concerning for hyperthyroidism. Repeat TSH and free T4 are  normal. Comfort measures.    DVT prophylaxis: Comfort care Code Status: DNR Family Communication: Pt in room, family is at  bedside  Status is: Inpatient  Remains inpatient appropriate because: Severity of illness    Consultants:  Palliative Care General Surgery  Procedures:  Hydrotherapy  Antimicrobials: Anti-infectives (From admission, onward)    Start     Dose/Rate Route Frequency Ordered Stop   04/05/21 1000  vancomycin (VANCOREADY) IVPB 1500 mg/300 mL  Status:  Discontinued        1,500 mg 150 mL/hr over 120 Minutes Intravenous Every 24 hours 04/04/21 1117 04/05/21 1420   04/03/21 1000  vancomycin (VANCOREADY) IVPB 1250 mg/250 mL  Status:  Discontinued        1,250 mg 166.7 mL/hr over 90 Minutes Intravenous Every 24 hours 04/02/21 1204 04/04/21 1117   04/03/21 1000  meropenem (MERREM) 1 g in sodium chloride 0.9 % 100 mL IVPB  Status:  Discontinued        1 g 200 mL/hr over 30 Minutes Intravenous Every 8 hours 04/03/21 0838 04/05/21 1420   04/02/21 1200  vancomycin (VANCOREADY) IVPB 1500 mg/300 mL  Status:  Discontinued        1,500 mg 150 mL/hr over 120 Minutes Intravenous Every 48 hours 03/16/2021 1308 04/01/21 0920   04/01/21 1200  vancomycin (VANCOREADY) IVPB 1000 mg/200 mL  Status:  Discontinued        1,000 mg 200 mL/hr over 60 Minutes Intravenous Daily 04/01/21 0920 04/02/21 1204   04/01/21 1000  ceFEPIme (MAXIPIME) 2 g in sodium chloride 0.9 % 100 mL IVPB  Status:  Discontinued        2 g 200 mL/hr over 30 Minutes Intravenous Every 12 hours 04/01/21 0924 04/03/21 0939   04/10/2021 1400  ceFEPIme (MAXIPIME) 2 g in sodium chloride 0.9 % 100 mL IVPB  Status:  Discontinued        2 g 200 mL/hr over 30 Minutes Intravenous Every 24 hours 03/29/2021 1305 04/01/21 0924   04/03/2021 1400  metroNIDAZOLE (FLAGYL) IVPB 500 mg  Status:  Discontinued        500 mg 100 mL/hr over 60 Minutes Intravenous Every 12 hours 03/24/2021 1305 04/03/21 1238   03/28/2021 1130  vancomycin  (VANCOREADY) IVPB 1500 mg/300 mL        1,500 mg 150 mL/hr over 120 Minutes Intravenous  Once 03/30/2021 1124 03/26/2021 1432   03/15/2021 1115  cefTRIAXone (ROCEPHIN) 2 g in sodium chloride 0.9 % 100 mL IVPB        2 g 200 mL/hr over 30 Minutes Intravenous  Once 03/19/2021 1107 04/08/2021 1204       Subjective: Cannot assess given mentation  Objective: Vitals:   04/07/21 0528 04/07/21 1853 04/07/21 1941 04/08/21 0406  BP: (!) 98/51 98/76 (!) 110/53 108/66  Pulse: 89  92 90  Resp: 13 13 13 16   Temp: 98.1 F (36.7 C)  (!) 97.4 F (36.3 C) 98.8 F (37.1 C)  TempSrc: Oral  Oral Oral  SpO2: 98%  97% 96%  Weight:      Height:       No intake or output data in the 24 hours ending 04/08/21 1706  Filed Weights   04/10/2021 0935 04/11/2021 2217 04/03/21 1513  Weight: 81.4 kg 71.9 kg 73 kg    Examination: General exam: awake but not conversant, in no acute distress Respiratory system: normal chest rise, clear, no audible wheezing Cardiovascular system: regular rhythm, s1-s2 Gastrointestinal system: Nondistended, nontender, pos BS Central nervous system: No seizures, no tremors Extremities: No cyanosis, no joint deformities Skin: No rashes, no pallor Psychiatry: unable to assess  given mentation   Data Reviewed: I have personally reviewed following labs and imaging studies  CBC: Recent Labs  Lab 04/02/21 0330 04/03/21 0357 04/04/21 0355 04/05/21 0352  WBC 30.6* 39.8* 29.8* 17.8*  HGB 10.8* 10.6* 9.4* 9.9*  HCT 34.9* 35.5* 30.4* 32.2*  MCV 96.4 98.1 96.2 95.0  PLT 219 195 162 902    Basic Metabolic Panel: Recent Labs  Lab 04/02/21 0330 04/03/21 0357 04/04/21 0355 04/04/21 0649 04/05/21 0352  NA 152* 150* 146*  --  144  K 3.0* 3.4* 2.9*  --  3.5  CL 123* 122* 120*  --  119*  CO2 21* 21* 21*  --  21*  GLUCOSE 150* 159* 141*  --  136*  BUN 60* 45* 35*  --  28*  CREATININE 1.21 1.01 0.82  --  0.54*  CALCIUM 8.7* 8.7* 8.3*  --  8.3*  MG 2.3  --   --  1.6*  --      GFR: Estimated Creatinine Clearance: 71 mL/min (A) (by C-G formula based on SCr of 0.54 mg/dL (L)). Liver Function Tests: No results for input(s): AST, ALT, ALKPHOS, BILITOT, PROT, ALBUMIN in the last 168 hours.  No results for input(s): LIPASE, AMYLASE in the last 168 hours. No results for input(s): AMMONIA in the last 168 hours. Coagulation Profile: No results for input(s): INR, PROTIME in the last 168 hours.  Cardiac Enzymes: No results for input(s): CKTOTAL, CKMB, CKMBINDEX, TROPONINI in the last 168 hours. BNP (last 3 results) No results for input(s): PROBNP in the last 8760 hours. HbA1C: No results for input(s): HGBA1C in the last 72 hours. CBG: Recent Labs  Lab 04/03/21 0607 04/03/21 1710 04/04/21 0459 04/05/21 0451 04/05/21 0506  GLUCAP 143* 131* 125* 131* 125*    Lipid Profile: No results for input(s): CHOL, HDL, LDLCALC, TRIG, CHOLHDL, LDLDIRECT in the last 72 hours. Thyroid Function Tests: No results for input(s): TSH, T4TOTAL, FREET4, T3FREE, THYROIDAB in the last 72 hours. Anemia Panel: No results for input(s): VITAMINB12, FOLATE, FERRITIN, TIBC, IRON, RETICCTPCT in the last 72 hours. Sepsis Labs: Recent Labs  Lab 04/03/21 0357 04/04/21 0355  PROCALCITON 0.72 0.55     Recent Results (from the past 240 hour(s))  Blood Culture (routine x 2)     Status: Abnormal   Collection Time: 03/17/2021  8:40 AM   Specimen: BLOOD  Result Value Ref Range Status   Specimen Description   Final    BLOOD RIGHT ARM Performed at [facility] [address], [place], [place] [address]    Special Requests   Final    BOTTLES DRAWN AEROBIC AND ANAEROBIC Blood Culture adequate volume Performed at [facility] [address], [place], [place] [address]    Culture  Setup Time   Final    GRAM NEGATIVE RODS ANAEROBIC BOTTLE ONLY CRITICAL RESULT CALLED TO, READ BACK BY AND VERIFIED WITH: PHARMD CHRISTY, [phone] FCP    Culture  (A)  Final    BACTEROIDES THETAIOTAOMICRON BETA LACTAMASE POSITIVE Performed at [facility], [place] [address], [place], [place] [address]    Report Status 04/04/2021 FINAL  Final  Blood Culture ID Panel (Reflexed)     Status: None   Collection Time: 03/17/2021  8:40 AM  Result Value Ref Range Status   Enterococcus faecalis NOT DETECTED NOT DETECTED Final   Enterococcus Faecium NOT DETECTED NOT DETECTED Final   Listeria monocytogenes NOT DETECTED NOT DETECTED Final   Staphylococcus species NOT  DETECTED NOT DETECTED Final   Staphylococcus aureus (BCID) NOT DETECTED NOT DETECTED Final   Staphylococcus epidermidis NOT DETECTED NOT DETECTED Final   Staphylococcus lugdunensis NOT DETECTED NOT DETECTED Final   Streptococcus species NOT DETECTED NOT DETECTED Final   Streptococcus agalactiae NOT DETECTED NOT DETECTED Final   Streptococcus pneumoniae NOT DETECTED NOT DETECTED Final   Streptococcus pyogenes NOT DETECTED NOT DETECTED Final   A.calcoaceticus-baumannii NOT DETECTED NOT DETECTED Final   Bacteroides fragilis NOT DETECTED NOT DETECTED Final   Enterobacterales NOT DETECTED NOT DETECTED Final   Enterobacter cloacae complex NOT DETECTED NOT DETECTED Final   Escherichia coli NOT DETECTED NOT DETECTED Final   Klebsiella aerogenes NOT DETECTED NOT DETECTED Final   Klebsiella oxytoca NOT DETECTED NOT DETECTED Final   Klebsiella pneumoniae NOT DETECTED NOT DETECTED Final   Proteus species NOT DETECTED NOT DETECTED Final   Salmonella species NOT DETECTED NOT DETECTED Final   Serratia marcescens NOT DETECTED NOT DETECTED Final   Haemophilus influenzae NOT DETECTED NOT DETECTED Final   Neisseria meningitidis NOT DETECTED NOT DETECTED Final   Pseudomonas aeruginosa NOT DETECTED NOT DETECTED Final   Stenotrophomonas maltophilia NOT DETECTED NOT DETECTED Final   Candida albicans NOT DETECTED NOT DETECTED Final   Candida auris NOT DETECTED NOT DETECTED Final   Candida glabrata NOT DETECTED  NOT DETECTED Final   Candida krusei NOT DETECTED NOT DETECTED Final   Candida parapsilosis NOT DETECTED NOT DETECTED Final   Candida tropicalis NOT DETECTED NOT DETECTED Final   Cryptococcus neoformans/gattii NOT DETECTED NOT DETECTED Final    Comment: Performed at [facility], [address] [place], [place] [address]  Urine Culture     Status: None   Collection Time: 03/22/2021  8:47 AM   Specimen: In/Out Cath Urine  Result Value Ref Range Status   Specimen Description   Final    IN/OUT CATH URINE Performed at [facility] [address], [place], [place] [address]    Special Requests   Final    NONE Performed at [facility], [place] [address], [place], [place] [address]    Culture   Final    NO GROWTH Performed at [facility], [place] [address], [place], [place] [address]    Report Status 04/01/2021 FINAL  Final  Resp Panel by RT-PCR (Flu A&B, Covid) Nasopharyngeal Swab     Status: Abnormal   Collection Time: 03/26/2021  8:49 AM   Specimen: Nasopharyngeal Swab; Nasopharyngeal(NP) swabs in vial transport medium  Result Value Ref Range Status   SARS Coronavirus 2 by RT PCR POSITIVE (A) NEGATIVE Final    Comment: (NOTE) SARS-CoV-2 target nucleic acids are DETECTED.  The SARS-CoV-2 RNA is generally detectable in upper respiratory specimens during the acute phase of infection. Positive results are indicative of the presence of the identified virus, but do not rule out bacterial infection or co-infection with other pathogens not detected by the test. Clinical correlation with patient history and other diagnostic information is necessary to determine patient infection status. The expected result is Negative.  Fact Sheet for Patients: EntrepreneurPulse.com.au  Fact Sheet for Healthcare Providers: IncredibleEmployment.be  This test is not yet approved or cleared by the [place] FDA and  has  been authorized for detection and/or diagnosis of SARS-CoV-2 by FDA under an Emergency Use Authorization (EUA).  This EUA will remain in effect (meaning this test can be used) for the duration of  the COVID-19 declaration under Section 564(b)(1) of the  A ct, 21 U.S.C. section 360bbb-3(b)(1), unless the authorization is terminated or revoked sooner.     Influenza A by PCR NEGATIVE NEGATIVE Final   Influenza B by PCR NEGATIVE NEGATIVE Final    Comment: (NOTE) The Xpert Xpress SARS-CoV-2/FLU/RSV plus assay is intended as an aid in the diagnosis of influenza from Nasopharyngeal swab specimens and should not be used as a sole basis for treatment. Nasal washings and aspirates are unacceptable for Xpert Xpress SARS-CoV-2/FLU/RSV testing.  Fact Sheet for Patients: EntrepreneurPulse.com.au  Fact Sheet for Healthcare Providers: IncredibleEmployment.be  This test is not yet approved or cleared by the [place] FDA and has been authorized for detection and/or diagnosis of SARS-CoV-2 by FDA under an Emergency Use Authorization (EUA). This EUA will remain in effect (meaning this test can be used) for the duration of the COVID-19 declaration under Section 564(b)(1) of the Act, 21 U.S.C. section 360bbb-3(b)(1), unless the authorization is terminated or revoked.  Performed at [facility], [place] [address] [place], [place] [address]   Blood Culture (routine x 2)     Status: None   Collection Time: 04/05/2021 11:44 AM   Specimen: BLOOD LEFT HAND  Result Value Ref Range Status   Specimen Description   Final    BLOOD LEFT HAND Performed at [facility] [address], [place], [place] [address]    Special Requests   Final    BOTTLES DRAWN AEROBIC AND ANAEROBIC Blood Culture results may not be optimal due to an inadequate volume of blood received in culture bottles Performed at [facility]  [address], [place], [place] [address]    Culture   Final    NO GROWTH 5 DAYS Performed at [facility], [place] [address] [place], [place] [address]    Report Status 04/05/2021 FINAL  Final  MRSA Next Gen by PCR, Nasal     Status: None   Collection Time: 04/01/21 11:11 AM   Specimen: Nasal Mucosa; Nasal Swab  Result Value Ref Range Status   MRSA by PCR Next Gen NOT DETECTED NOT DETECTED Final    Comment: (NOTE) The GeneXpert MRSA Assay (FDA approved for NASAL specimens only), is one component of a comprehensive MRSA colonization surveillance program. It is not intended to diagnose MRSA infection nor to guide or monitor treatment for MRSA infections. Test performance is not FDA approved in patients less than 8 years old. Performed at [facility], [place] [address], [place], [place] [address]       Radiology Studies: No results found.  Scheduled Meds:  Chlorhexidine Gluconate Cloth  6 each Topical Daily   glycopyrrolate  0.3 mg Intravenous Q4H    morphine injection  2 mg Intravenous Q4H   Continuous Infusions:   LOS: 8 days   Marylu Lund, MD [facility] Pager On Amion  If 7PM-7AM, please contact night-coverage 04/08/2021, 5:06 PM

## 2021-04-08 NOTE — Care Management Important Message (Signed)
Important Message  Patient Details Patient under Palliative care. Name: Erik Lara MRN: [id number] Date of Birth: [birth year]   Medicare Important Message Given:  No     Kerin Salen 04/08/2021, 1:25 PM

## 2021-04-08 NOTE — Progress Notes (Signed)
Nutrition Brief Note  Patient assessed by a RD on 1/20. Chart reviewed. Patient now transitioning to comfort care with Palliative Care following and note yesterday (1/25) indicating likely hospital death.  No further nutrition interventions planned at this time. Please re-consult as needed.      Jarome Matin, MS, RD, LDN Inpatient Clinical Dietitian RD pager # available in [place]  After hours/weekend pager # available in [facility]

## 2021-04-09 DIAGNOSIS — G9341 Metabolic encephalopathy: Secondary | ICD-10-CM | POA: Diagnosis not present

## 2021-04-09 MED ORDER — LORAZEPAM 2 MG/ML IJ SOLN
1.0000 mg | Freq: Two times a day (BID) | INTRAMUSCULAR | Status: DC
  Administered 2021-04-09 – 2021-04-18 (×13): 1 mg via INTRAVENOUS
  Filled 2021-04-09 (×14): qty 1

## 2021-04-09 NOTE — Progress Notes (Signed)
PROGRESS NOTE    Erik Lara  NWG:[id number] DOB: 02/10/1936 DOA: 03/16/2021 PCP: Dorothyann Peng, NP    Brief Narrative:  86 y.o. male with a history of hyperlipidemia, lewy body dementia, diabetes mellitus, sacral pressure injury. Patient presented secondary to altered mental status and shortness of breath found to have evidence of sepsis secondary to a sacral decubitus ulcer. Blood cultures significant for bacteroides thetaiotaomicron. General surgery on board for debridement of decubitus ulcer. Patient is now transitioned to comfort measures.  Assessment & Plan:   * Sepsis ([place])- (present on admission) Present on admission. Likely secondary to sacral decubitus ulcer. Procalcitonin of 0.31. Afebrile with leukocytosis. Antibiotics were initially started on admission and blood cultures obtained. One set of blood cultures significant for Bacteroides Thetaiotaomicron.  -Continued on comfort measures. Anticipating hospital death as pt is continuing to decline   Decubitus ulcer of sacral region, unstageable [facility]) Concern that this is a source for infection/sepsis. General surgery consulted. CT pelvis significant for no abscess formation. Started empirically on Vancomycin, Cefepime and Flagyl. Patient developed worsening leukocytosis and continued fevers with subsequent change to meropenem IV.  -Pt has since been transitioned to comfort measures.   Hypernatremia-resolved as of 04/05/2021 Unsure of chronicity. Secondary to poor oral intake. Initial worsening with peak of 152, later improved with IV fluids. Comfort measures.   Hydronephrosis Likely related to acute urinary retention. Left greater than right. Associated AKI on admission.   AKI (acute kidney injury) ([place])- Creatinine of 2.42 on admission. Secondary to acute urinary retention in addition to decreased oral intake. Foley catheter placed and IV fluids given with improved creatinine. -continue foley cath for comfort   Pressure  injury of skin- (present on admission) Left pretibial, left heel, right heel, medial sacrum, anterior/left ankle, anterior/left knee, posterior/right elbow.  -Was seen by wound care this visit   COVID-19 virus infection- (present on admission) Previously diagnosed on 02/26/2021. Currently asymptomatic. No treatment initiated this admission. Chest x-ray without acute process.   Lewy body dementia ([place])- (present on admission) Patient follows with neurology. Held [place] and Aricept this visit   Type 2 diabetes mellitus without complication, without long-term current use of insulin ([place]) Well controlled. Last hemoglobin A1C of 5.7% from 12/22.   Hyperlipidemia- (present on admission) Hold home Crestor   Goals of care, counseling/discussion While at SNF, patient was referred to hospice; this was apparently not known by family at the time and they have not accepted hospice care. Patient remains unable to eat/drink secondary to severe encephalopathy. Patient also shows signs of dehydration complicated by poor albumin/third spacing.   1/23: Transitioned to full comfort measures with plan to discharge to hospice facility.   Protein-calorie malnutrition, severe Dietitian consulted. Patient is currently NPO secondary to mental status.   Acute urinary retention Unknown etiology. Bladder scan in the ED was significant for 1200 mL of measured urine. Foley catheter placed. Urine culture with no growth. Associated left moderate and right mild hydronephrosis. -Continue foley catheter for comfort   Acute metabolic encephalopathy Possibly multifactorial in setting of underlying dementia, hypernatremia, acute infection. CT head unremarkable for acute process and EEG significant for evidence of diffuse cerebral dysfunction but no evidence of seizure activity.  -Now focus on comfort measures   Abnormal TSH- (present on admission) Recent TSH from 12/18 of 0.143 with free T4 of 1.39 (elevated) and T3 of  57 (low). Concerning for hyperthyroidism. Repeat TSH and free T4 are normal. Comfort measures.    DVT prophylaxis: Comfort care Code Status:  DNR Family Communication: Pt in room, family is at bedside  Status is: Inpatient  Remains inpatient appropriate because: Severity of illness    Consultants:  Palliative Care General Surgery  Procedures:  Hydrotherapy  Antimicrobials: Anti-infectives (From admission, onward)    Start     Dose/Rate Route Frequency Ordered Stop   04/05/21 1000  vancomycin (VANCOREADY) IVPB 1500 mg/300 mL  Status:  Discontinued        1,500 mg 150 mL/hr over 120 Minutes Intravenous Every 24 hours 04/04/21 1117 04/05/21 1420   04/03/21 1000  vancomycin (VANCOREADY) IVPB 1250 mg/250 mL  Status:  Discontinued        1,250 mg 166.7 mL/hr over 90 Minutes Intravenous Every 24 hours 04/02/21 1204 04/04/21 1117   04/03/21 1000  meropenem (MERREM) 1 g in sodium chloride 0.9 % 100 mL IVPB  Status:  Discontinued        1 g 200 mL/hr over 30 Minutes Intravenous Every 8 hours 04/03/21 0838 04/05/21 1420   04/02/21 1200  vancomycin (VANCOREADY) IVPB 1500 mg/300 mL  Status:  Discontinued        1,500 mg 150 mL/hr over 120 Minutes Intravenous Every 48 hours 03/28/2021 1308 04/01/21 0920   04/01/21 1200  vancomycin (VANCOREADY) IVPB 1000 mg/200 mL  Status:  Discontinued        1,000 mg 200 mL/hr over 60 Minutes Intravenous Daily 04/01/21 0920 04/02/21 1204   04/01/21 1000  ceFEPIme (MAXIPIME) 2 g in sodium chloride 0.9 % 100 mL IVPB  Status:  Discontinued        2 g 200 mL/hr over 30 Minutes Intravenous Every 12 hours 04/01/21 0924 04/03/21 0939   03/28/2021 1400  ceFEPIme (MAXIPIME) 2 g in sodium chloride 0.9 % 100 mL IVPB  Status:  Discontinued        2 g 200 mL/hr over 30 Minutes Intravenous Every 24 hours 03/30/2021 1305 04/01/21 0924   04/03/2021 1400  metroNIDAZOLE (FLAGYL) IVPB 500 mg  Status:  Discontinued        500 mg 100 mL/hr over 60 Minutes Intravenous Every 12  hours 03/16/2021 1305 04/03/21 1238   04/05/2021 1130  vancomycin (VANCOREADY) IVPB 1500 mg/300 mL        1,500 mg 150 mL/hr over 120 Minutes Intravenous  Once 04/04/2021 1124 04/13/2021 1432   03/14/2021 1115  cefTRIAXone (ROCEPHIN) 2 g in sodium chloride 0.9 % 100 mL IVPB        2 g 200 mL/hr over 30 Minutes Intravenous  Once 04/06/2021 1107 03/24/2021 1204       Subjective: Unable to assess given mentation  Objective: Vitals:   04/07/21 1853 04/07/21 1941 04/08/21 0406 04/08/21 2122  BP: 98/76 (!) 110/53 108/66 135/78  Pulse:  92 90 84  Resp: 13 13 16 15   Temp:  (!) 97.4 F (36.3 C) 98.8 F (37.1 C) 98 F (36.7 C)  TempSrc:  Oral Oral Axillary  SpO2:  97% 96% 98%  Weight:      Height:        Intake/Output Summary (Last 24 hours) at 04/09/2021 1456 Last data filed at 04/08/2021 2123 Gross per 24 hour  Intake --  Output 1100 ml  Net -1100 ml    Filed Weights   04/03/2021 0935 03/24/2021 2217 04/03/21 1513  Weight: 81.4 kg 71.9 kg 73 kg    Examination: General exam: Asleep, laying in bed, in nad Respiratory system: Normal respiratory effort, no audible wheezing Central nervous system: no tremors or seizures  Data Reviewed: I have personally reviewed following labs and imaging studies  CBC: Recent Labs  Lab 04/03/21 0357 04/04/21 0355 04/05/21 0352  WBC 39.8* 29.8* 17.8*  HGB 10.6* 9.4* 9.9*  HCT 35.5* 30.4* 32.2*  MCV 98.1 96.2 95.0  PLT 195 162 536    Basic Metabolic Panel: Recent Labs  Lab 04/03/21 0357 04/04/21 0355 04/04/21 0649 04/05/21 0352  NA 150* 146*  --  144  K 3.4* 2.9*  --  3.5  CL 122* 120*  --  119*  CO2 21* 21*  --  21*  GLUCOSE 159* 141*  --  136*  BUN 45* 35*  --  28*  CREATININE 1.01 0.82  --  0.54*  CALCIUM 8.7* 8.3*  --  8.3*  MG  --   --  1.6*  --     GFR: Estimated Creatinine Clearance: 71 mL/min (A) (by C-G formula based on SCr of 0.54 mg/dL (L)). Liver Function Tests: No results for input(s): AST, ALT, ALKPHOS, BILITOT, PROT,  ALBUMIN in the last 168 hours.  No results for input(s): LIPASE, AMYLASE in the last 168 hours. No results for input(s): AMMONIA in the last 168 hours. Coagulation Profile: No results for input(s): INR, PROTIME in the last 168 hours.  Cardiac Enzymes: No results for input(s): CKTOTAL, CKMB, CKMBINDEX, TROPONINI in the last 168 hours. BNP (last 3 results) No results for input(s): PROBNP in the last 8760 hours. HbA1C: No results for input(s): HGBA1C in the last 72 hours. CBG: Recent Labs  Lab 04/03/21 0607 04/03/21 1710 04/04/21 0459 04/05/21 0451 04/05/21 0506  GLUCAP 143* 131* 125* 131* 125*    Lipid Profile: No results for input(s): CHOL, HDL, LDLCALC, TRIG, CHOLHDL, LDLDIRECT in the last 72 hours. Thyroid Function Tests: No results for input(s): TSH, T4TOTAL, FREET4, T3FREE, THYROIDAB in the last 72 hours. Anemia Panel: No results for input(s): VITAMINB12, FOLATE, FERRITIN, TIBC, IRON, RETICCTPCT in the last 72 hours. Sepsis Labs: Recent Labs  Lab 04/03/21 0357 04/04/21 0355  PROCALCITON 0.72 0.55     Recent Results (from the past 240 hour(s))  Blood Culture (routine x 2)     Status: Abnormal   Collection Time: 04/10/2021  8:40 AM   Specimen: BLOOD  Result Value Ref Range Status   Specimen Description   Final    BLOOD RIGHT ARM Performed at [facility] [address] [place], [place] [address]    Special Requests   Final    BOTTLES DRAWN AEROBIC AND ANAEROBIC Blood Culture adequate volume Performed at [facility] [address], [place], [place] [address]    Culture  Setup Time   Final    GRAM NEGATIVE RODS ANAEROBIC BOTTLE ONLY CRITICAL RESULT CALLED TO, READ BACK BY AND VERIFIED WITH: PHARMD CHRISTY, [phone] FCP    Culture (A)  Final    BACTEROIDES THETAIOTAOMICRON BETA LACTAMASE POSITIVE Performed at [facility], [place] [address] [place], [place] [address]    Report Status 04/04/2021 FINAL  Final   Blood Culture ID Panel (Reflexed)     Status: None   Collection Time: 03/28/2021  8:40 AM  Result Value Ref Range Status   Enterococcus faecalis NOT DETECTED NOT DETECTED Final   Enterococcus Faecium NOT DETECTED NOT DETECTED Final   Listeria monocytogenes NOT DETECTED NOT DETECTED Final   Staphylococcus species NOT DETECTED NOT DETECTED Final   Staphylococcus aureus (BCID) NOT DETECTED NOT DETECTED Final   Staphylococcus epidermidis NOT DETECTED NOT DETECTED  Final   Staphylococcus lugdunensis NOT DETECTED NOT DETECTED Final   Streptococcus species NOT DETECTED NOT DETECTED Final   Streptococcus agalactiae NOT DETECTED NOT DETECTED Final   Streptococcus pneumoniae NOT DETECTED NOT DETECTED Final   Streptococcus pyogenes NOT DETECTED NOT DETECTED Final   A.calcoaceticus-baumannii NOT DETECTED NOT DETECTED Final   Bacteroides fragilis NOT DETECTED NOT DETECTED Final   Enterobacterales NOT DETECTED NOT DETECTED Final   Enterobacter cloacae complex NOT DETECTED NOT DETECTED Final   Escherichia coli NOT DETECTED NOT DETECTED Final   Klebsiella aerogenes NOT DETECTED NOT DETECTED Final   Klebsiella oxytoca NOT DETECTED NOT DETECTED Final   Klebsiella pneumoniae NOT DETECTED NOT DETECTED Final   Proteus species NOT DETECTED NOT DETECTED Final   Salmonella species NOT DETECTED NOT DETECTED Final   Serratia marcescens NOT DETECTED NOT DETECTED Final   Haemophilus influenzae NOT DETECTED NOT DETECTED Final   Neisseria meningitidis NOT DETECTED NOT DETECTED Final   Pseudomonas aeruginosa NOT DETECTED NOT DETECTED Final   Stenotrophomonas maltophilia NOT DETECTED NOT DETECTED Final   Candida albicans NOT DETECTED NOT DETECTED Final   Candida auris NOT DETECTED NOT DETECTED Final   Candida glabrata NOT DETECTED NOT DETECTED Final   Candida krusei NOT DETECTED NOT DETECTED Final   Candida parapsilosis NOT DETECTED NOT DETECTED Final   Candida tropicalis NOT DETECTED NOT DETECTED Final    Cryptococcus neoformans/gattii NOT DETECTED NOT DETECTED Final    Comment: Performed at [facility], [address], [place], [place] [address]  Urine Culture     Status: None   Collection Time: 03/24/2021  8:47 AM   Specimen: In/Out Cath Urine  Result Value Ref Range Status   Specimen Description   Final    IN/OUT CATH URINE Performed at [facility] [address] [place], [place] [address]    Special Requests   Final    NONE Performed at [facility], [place] [address] [place], [place] [address]    Culture   Final    NO GROWTH Performed at [facility], [place] [address] [place], [place] [address]    Report Status 04/01/2021 FINAL  Final  Resp Panel by RT-PCR (Flu A&B, Covid) Nasopharyngeal Swab     Status: Abnormal   Collection Time: 03/17/2021  8:49 AM   Specimen: Nasopharyngeal Swab; Nasopharyngeal(NP) swabs in vial transport medium  Result Value Ref Range Status   SARS Coronavirus 2 by RT PCR POSITIVE (A) NEGATIVE Final    Comment: (NOTE) SARS-CoV-2 target nucleic acids are DETECTED.  The SARS-CoV-2 RNA is generally detectable in upper respiratory specimens during the acute phase of infection. Positive results are indicative of the presence of the identified virus, but do not rule out bacterial infection or co-infection with other pathogens not detected by the test. Clinical correlation with patient history and other diagnostic information is necessary to determine patient infection status. The expected result is Negative.  Fact Sheet for Patients: EntrepreneurPulse.com.au  Fact Sheet for Healthcare Providers: IncredibleEmployment.be  This test is not yet approved or cleared by the [place] FDA and  has been authorized for detection and/or diagnosis of SARS-CoV-2 by FDA under an Emergency Use Authorization (EUA).  This EUA will remain in effect (meaning this test can be used) for  the duration of  the COVID-19 declaration under Section 564(b)(1) of the A ct, 21 U.S.C. section 360bbb-3(b)(1), unless the authorization is terminated or revoked sooner.     Influenza A by PCR  NEGATIVE NEGATIVE Final   Influenza B by PCR NEGATIVE NEGATIVE Final    Comment: (NOTE) The Xpert Xpress SARS-CoV-2/FLU/RSV plus assay is intended as an aid in the diagnosis of influenza from Nasopharyngeal swab specimens and should not be used as a sole basis for treatment. Nasal washings and aspirates are unacceptable for Xpert Xpress SARS-CoV-2/FLU/RSV testing.  Fact Sheet for Patients: EntrepreneurPulse.com.au  Fact Sheet for Healthcare Providers: IncredibleEmployment.be  This test is not yet approved or cleared by the [place] FDA and has been authorized for detection and/or diagnosis of SARS-CoV-2 by FDA under an Emergency Use Authorization (EUA). This EUA will remain in effect (meaning this test can be used) for the duration of the COVID-19 declaration under Section 564(b)(1) of the Act, 21 U.S.C. section 360bbb-3(b)(1), unless the authorization is terminated or revoked.  Performed at [facility], [place] [address], [place], [place] [address]   Blood Culture (routine x 2)     Status: None   Collection Time: 04/10/2021 11:44 AM   Specimen: BLOOD LEFT HAND  Result Value Ref Range Status   Specimen Description   Final    BLOOD LEFT HAND Performed at [facility] [address], [place], [place] [address]    Special Requests   Final    BOTTLES DRAWN AEROBIC AND ANAEROBIC Blood Culture results may not be optimal due to an inadequate volume of blood received in culture bottles Performed at [facility] [address], [place], [place] [address]    Culture   Final    NO GROWTH 5 DAYS Performed at [facility], [place] [address], [place], [place] [address]    Report Status 04/05/2021  FINAL  Final  MRSA Next Gen by PCR, Nasal     Status: None   Collection Time: 04/01/21 11:11 AM   Specimen: Nasal Mucosa; Nasal Swab  Result Value Ref Range Status   MRSA by PCR Next Gen NOT DETECTED NOT DETECTED Final    Comment: (NOTE) The GeneXpert MRSA Assay (FDA approved for NASAL specimens only), is one component of a comprehensive MRSA colonization surveillance program. It is not intended to diagnose MRSA infection nor to guide or monitor treatment for MRSA infections. Test performance is not FDA approved in patients less than 31 years old. Performed at [facility], [place] [address] [place], [place] [address]       Radiology Studies: No results found.  Scheduled Meds:  Chlorhexidine Gluconate Cloth  6 each Topical Daily   glycopyrrolate  0.3 mg Intravenous Q4H   LORazepam  1 mg Intravenous Q12H    morphine injection  2 mg Intravenous Q4H   Continuous Infusions:   LOS: 9 days   Marylu Lund, MD [facility] Pager On Amion  If 7PM-7AM, please contact night-coverage 04/09/2021, 2:56 PM

## 2021-04-09 NOTE — Progress Notes (Signed)
Actively dying. Eyes open most of the night and has not rested. Will give Ativan 1mg  IV for sedation. Son at bedside. I updated him and provided support.  Lane Hacker, DO Palliative Medicine

## 2021-04-09 NOTE — Progress Notes (Signed)
Manufacturing engineer [facility]) Hospital Liaison Note  Per MD Hilma Favors patient not stable for transfer to [facility]. Please let us know if anything changes.    Buck Mam [facility] Liaison [phone]

## 2021-04-09 NOTE — TOC Progression Note (Signed)
Transition of Care [facility]) - Progression Note    Patient Details  Name: Erik Lara MRN: [id number] Date of Birth: [birth year]  Transition of [facility]) CM/SW Contact  Purcell Mouton, RN Phone Number: 04/09/2021, 1:19 PM  Clinical Narrative:    Per MD's pt is not stable to transfer to hospice.   Expected Discharge Plan: [place] Barriers to Discharge: Continued Medical Work up  Expected Discharge Plan and Services Expected Discharge Plan: [place]   Discharge Planning Services: CM Consult Post Acute Care Choice: [place] Living arrangements for the past 2 months: [place] ([place])                                       Social Determinants of Health (SDOH) Interventions    Readmission Risk Interventions No flowsheet data found.

## 2021-04-10 DIAGNOSIS — G9341 Metabolic encephalopathy: Secondary | ICD-10-CM | POA: Diagnosis not present

## 2021-04-10 DIAGNOSIS — N179 Acute kidney failure, unspecified: Secondary | ICD-10-CM | POA: Diagnosis not present

## 2021-04-10 DIAGNOSIS — R338 Other retention of urine: Secondary | ICD-10-CM | POA: Diagnosis not present

## 2021-04-10 NOTE — Progress Notes (Signed)
PROGRESS NOTE    Erik Lara  ZCH:[id number] DOB: 1935/04/24 DOA: 03/20/2021 PCP: Dorothyann Peng, NP    Brief Narrative:  86 y.o. male with a history of hyperlipidemia, lewy body dementia, diabetes mellitus, sacral pressure injury. Patient presented secondary to altered mental status and shortness of breath found to have evidence of sepsis secondary to a sacral decubitus ulcer. Blood cultures significant for bacteroides thetaiotaomicron. General surgery on board for debridement of decubitus ulcer. Patient is now transitioned to comfort measures.  Assessment & Plan:   * Sepsis ([place])- (present on admission) Present on admission. Likely secondary to sacral decubitus ulcer. Procalcitonin of 0.31. Afebrile with leukocytosis. Antibiotics were initially started on admission and blood cultures obtained. One set of blood cultures significant for Bacteroides Thetaiotaomicron.  -Continued on comfort measures. With decline, anticipating hospital death   Decubitus ulcer of sacral region, unstageable [facility]) Concern that this is a source for infection/sepsis. General surgery consulted. CT pelvis significant for no abscess formation. Started empirically on Vancomycin, Cefepime and Flagyl. Patient developed worsening leukocytosis and continued fevers with subsequent change to meropenem IV.  -Pt has since been transitioned to comfort measures.   Hypernatremia-resolved as of 04/05/2021 Unsure of chronicity. Secondary to poor oral intake. Initial worsening with peak of 152, later improved with IV fluids. Comfort measures.   Hydronephrosis Likely related to acute urinary retention. Left greater than right. Associated AKI on admission.   AKI (acute kidney injury) ([place])- Creatinine of 2.42 on admission. Secondary to acute urinary retention in addition to decreased oral intake. Foley catheter placed and IV fluids given with improved creatinine. -continue foley cath for comfort   Pressure injury of skin-  (present on admission) Left pretibial, left heel, right heel, medial sacrum, anterior/left ankle, anterior/left knee, posterior/right elbow.  -Was seen by wound care this visit   COVID-19 virus infection- (present on admission) Previously diagnosed on 02/26/2021. Currently asymptomatic. No treatment initiated this admission. Chest x-ray without acute process.   Lewy body dementia ([place])- (present on admission) Patient follows with neurology. Held [place] and Aricept this visit   Type 2 diabetes mellitus without complication, without long-term current use of insulin ([place]) Well controlled. Last hemoglobin A1C of 5.7% from 12/22.   Hyperlipidemia- (present on admission) Hold home Crestor   Goals of care, counseling/discussion While at SNF, patient was referred to hospice; this was apparently not known by family at the time and they have not accepted hospice care. Patient remains unable to eat/drink secondary to severe encephalopathy. Patient also shows signs of dehydration complicated by poor albumin/third spacing.   1/23: Transitioned to full comfort measures with plan to discharge to hospice facility.   Protein-calorie malnutrition, severe Dietitian consulted. Patient is currently NPO secondary to mental status.   Acute urinary retention Unknown etiology. Bladder scan in the ED was significant for 1200 mL of measured urine. Foley catheter placed. Urine culture with no growth. Associated left moderate and right mild hydronephrosis. -Continue foley catheter for comfort   Acute metabolic encephalopathy Possibly multifactorial in setting of underlying dementia, hypernatremia, acute infection. CT head unremarkable for acute process and EEG significant for evidence of diffuse cerebral dysfunction but no evidence of seizure activity.  -Now focus on comfort measures   Abnormal TSH- (present on admission) Recent TSH from 12/18 of 0.143 with free T4 of 1.39 (elevated) and T3 of 57 (low).  Concerning for hyperthyroidism. Repeat TSH and free T4 are normal. Comfort measures.    DVT prophylaxis: Comfort care Code Status: DNR Family Communication: Pt  in room, family is at bedside  Status is: Inpatient  Remains inpatient appropriate because: Severity of illness    Consultants:  Palliative Care General Surgery  Procedures:  Hydrotherapy  Antimicrobials: Anti-infectives (From admission, onward)    Start     Dose/Rate Route Frequency Ordered Stop   04/05/21 1000  vancomycin (VANCOREADY) IVPB 1500 mg/300 mL  Status:  Discontinued        1,500 mg 150 mL/hr over 120 Minutes Intravenous Every 24 hours 04/04/21 1117 04/05/21 1420   04/03/21 1000  vancomycin (VANCOREADY) IVPB 1250 mg/250 mL  Status:  Discontinued        1,250 mg 166.7 mL/hr over 90 Minutes Intravenous Every 24 hours 04/02/21 1204 04/04/21 1117   04/03/21 1000  meropenem (MERREM) 1 g in sodium chloride 0.9 % 100 mL IVPB  Status:  Discontinued        1 g 200 mL/hr over 30 Minutes Intravenous Every 8 hours 04/03/21 0838 04/05/21 1420   04/02/21 1200  vancomycin (VANCOREADY) IVPB 1500 mg/300 mL  Status:  Discontinued        1,500 mg 150 mL/hr over 120 Minutes Intravenous Every 48 hours 04/09/2021 1308 04/01/21 0920   04/01/21 1200  vancomycin (VANCOREADY) IVPB 1000 mg/200 mL  Status:  Discontinued        1,000 mg 200 mL/hr over 60 Minutes Intravenous Daily 04/01/21 0920 04/02/21 1204   04/01/21 1000  ceFEPIme (MAXIPIME) 2 g in sodium chloride 0.9 % 100 mL IVPB  Status:  Discontinued        2 g 200 mL/hr over 30 Minutes Intravenous Every 12 hours 04/01/21 0924 04/03/21 0939   03/18/2021 1400  ceFEPIme (MAXIPIME) 2 g in sodium chloride 0.9 % 100 mL IVPB  Status:  Discontinued        2 g 200 mL/hr over 30 Minutes Intravenous Every 24 hours 04/07/2021 1305 04/01/21 0924   04/09/2021 1400  metroNIDAZOLE (FLAGYL) IVPB 500 mg  Status:  Discontinued        500 mg 100 mL/hr over 60 Minutes Intravenous Every 12 hours  04/12/2021 1305 04/03/21 1238   04/08/2021 1130  vancomycin (VANCOREADY) IVPB 1500 mg/300 mL        1,500 mg 150 mL/hr over 120 Minutes Intravenous  Once 03/22/2021 1124 03/19/2021 1432   03/19/2021 1115  cefTRIAXone (ROCEPHIN) 2 g in sodium chloride 0.9 % 100 mL IVPB        2 g 200 mL/hr over 30 Minutes Intravenous  Once 04/11/2021 1107 03/21/2021 1204       Subjective: Unable to assess given mentation  Objective: Vitals:   04/08/21 0406 04/08/21 2122 04/09/21 1942 04/10/21 0957  BP: 108/66 135/78 136/85 129/72  Pulse: 90 84 84 85  Resp: 16 15 18 18   Temp: 98.8 F (37.1 C) 98 F (36.7 C) 97.7 F (36.5 C) 98 F (36.7 C)  TempSrc: Oral Axillary Axillary Axillary  SpO2: 96% 98% 96% 97%  Weight:      Height:        Intake/Output Summary (Last 24 hours) at 04/10/2021 1650 Last data filed at 04/10/2021 0400 Gross per 24 hour  Intake --  Output 425 ml  Net -425 ml    Filed Weights   03/25/2021 0935 04/08/2021 2217 04/03/21 1513  Weight: 81.4 kg 71.9 kg 73 kg    Examination: General exam: laying in bed, in no acute distress Respiratory system: normal chest rise, clear, no audible wheezing Central nervous system: No seizures, no tremors  Data Reviewed: I have personally reviewed following labs and imaging studies  CBC: Recent Labs  Lab 04/04/21 0355 04/05/21 0352  WBC 29.8* 17.8*  HGB 9.4* 9.9*  HCT 30.4* 32.2*  MCV 96.2 95.0  PLT 162 527    Basic Metabolic Panel: Recent Labs  Lab 04/04/21 0355 04/04/21 0649 04/05/21 0352  NA 146*  --  144  K 2.9*  --  3.5  CL 120*  --  119*  CO2 21*  --  21*  GLUCOSE 141*  --  136*  BUN 35*  --  28*  CREATININE 0.82  --  0.54*  CALCIUM 8.3*  --  8.3*  MG  --  1.6*  --     GFR: Estimated Creatinine Clearance: 71 mL/min (A) (by C-G formula based on SCr of 0.54 mg/dL (L)). Liver Function Tests: No results for input(s): AST, ALT, ALKPHOS, BILITOT, PROT, ALBUMIN in the last 168 hours.  No results for input(s): LIPASE, AMYLASE in  the last 168 hours. No results for input(s): AMMONIA in the last 168 hours. Coagulation Profile: No results for input(s): INR, PROTIME in the last 168 hours.  Cardiac Enzymes: No results for input(s): CKTOTAL, CKMB, CKMBINDEX, TROPONINI in the last 168 hours. BNP (last 3 results) No results for input(s): PROBNP in the last 8760 hours. HbA1C: No results for input(s): HGBA1C in the last 72 hours. CBG: Recent Labs  Lab 04/03/21 1710 04/04/21 0459 04/05/21 0451 04/05/21 0506  GLUCAP 131* 125* 131* 125*    Lipid Profile: No results for input(s): CHOL, HDL, LDLCALC, TRIG, CHOLHDL, LDLDIRECT in the last 72 hours. Thyroid Function Tests: No results for input(s): TSH, T4TOTAL, FREET4, T3FREE, THYROIDAB in the last 72 hours. Anemia Panel: No results for input(s): VITAMINB12, FOLATE, FERRITIN, TIBC, IRON, RETICCTPCT in the last 72 hours. Sepsis Labs: Recent Labs  Lab 04/04/21 0355  PROCALCITON 0.55     Recent Results (from the past 240 hour(s))  MRSA Next Gen by PCR, Nasal     Status: None   Collection Time: 04/01/21 11:11 AM   Specimen: Nasal Mucosa; Nasal Swab  Result Value Ref Range Status   MRSA by PCR Next Gen NOT DETECTED NOT DETECTED Final    Comment: (NOTE) The GeneXpert MRSA Assay (FDA approved for NASAL specimens only), is one component of a comprehensive MRSA colonization surveillance program. It is not intended to diagnose MRSA infection nor to guide or monitor treatment for MRSA infections. Test performance is not FDA approved in patients less than 62 years old. Performed at [facility], [place] [address] [place], [place] [address]       Radiology Studies: No results found.  Scheduled Meds:  Chlorhexidine Gluconate Cloth  6 each Topical Daily   glycopyrrolate  0.3 mg Intravenous Q4H   LORazepam  1 mg Intravenous Q12H    morphine injection  2 mg Intravenous Q4H   Continuous Infusions:   LOS: 10 days   Marylu Lund, MD [facility] Pager On Amion  If 7PM-7AM, please contact night-coverage 04/10/2021, 4:50 PM

## 2021-04-11 DIAGNOSIS — R338 Other retention of urine: Secondary | ICD-10-CM | POA: Diagnosis not present

## 2021-04-11 DIAGNOSIS — G9341 Metabolic encephalopathy: Secondary | ICD-10-CM | POA: Diagnosis not present

## 2021-04-11 NOTE — Progress Notes (Signed)
PROGRESS NOTE    Erik Lara  QIW:[id number] DOB: [birth year] DOA: 03/22/2021 PCP: Dorothyann Peng, NP    Brief Narrative:  86 y.o. male with a history of hyperlipidemia, lewy body dementia, diabetes mellitus, sacral pressure injury. Patient presented secondary to altered mental status and shortness of breath found to have evidence of sepsis secondary to a sacral decubitus ulcer. Blood cultures significant for bacteroides thetaiotaomicron. General surgery on board for debridement of decubitus ulcer. Patient is now transitioned to comfort measures.  Assessment & Plan:   * Sepsis ([place])- (present on admission) Present on admission. Likely secondary to sacral decubitus ulcer. Procalcitonin of 0.31. Afebrile with leukocytosis. Antibiotics were initially started on admission and blood cultures obtained. One set of blood cultures significant for Bacteroides Thetaiotaomicron.  -Continued on comfort measures with anticipation for hospital death   Decubitus ulcer of sacral region, unstageable [facility]) Concern that this is a source for infection/sepsis. General surgery consulted. CT pelvis significant for no abscess formation. Started empirically on Vancomycin, Cefepime and Flagyl. Patient developed worsening leukocytosis and continued fevers with subsequent change to meropenem IV.  -Pt has since been transitioned to comfort measures.   Hypernatremia-resolved as of 04/05/2021 Unsure of chronicity. Secondary to poor oral intake. Initial worsening with peak of 152, later improved with IV fluids. Comfort measures.   Hydronephrosis Likely related to acute urinary retention. Left greater than right. Associated AKI on admission.   AKI (acute kidney injury) ([place])- Creatinine of 2.42 on admission. Secondary to acute urinary retention in addition to decreased oral intake. Foley catheter placed and IV fluids given with improved creatinine. -continue foley cath for comfort   Pressure injury of skin- (present  on admission) Left pretibial, left heel, right heel, medial sacrum, anterior/left ankle, anterior/left knee, posterior/right elbow.  -Was seen by wound care this visit   COVID-19 virus infection- (present on admission) Previously diagnosed on 02/26/2021. Currently asymptomatic. No treatment initiated this admission. Chest x-ray without acute process.   Lewy body dementia ([place])- (present on admission) Patient follows with neurology. Held [place] and Aricept this visit   Type 2 diabetes mellitus without complication, without long-term current use of insulin ([place]) Well controlled. Last hemoglobin A1C of 5.7% from 12/22.   Hyperlipidemia- (present on admission) Hold home Crestor   Goals of care, counseling/discussion While at SNF, patient was referred to hospice; this was apparently not known by family at the time and they have not accepted hospice care. Patient remains unable to eat/drink secondary to severe encephalopathy. Patient also shows signs of dehydration complicated by poor albumin/third spacing.   1/23: Transitioned to full comfort measures with plan to discharge to hospice facility.   Protein-calorie malnutrition, severe Dietitian consulted. Patient is currently NPO secondary to mental status.   Acute urinary retention Unknown etiology. Bladder scan in the ED was significant for 1200 mL of measured urine. Foley catheter placed. Urine culture with no growth. Associated left moderate and right mild hydronephrosis. -Continue foley catheter for comfort   Acute metabolic encephalopathy Possibly multifactorial in setting of underlying dementia, hypernatremia, acute infection. CT head unremarkable for acute process and EEG significant for evidence of diffuse cerebral dysfunction but no evidence of seizure activity.  -Now focus on comfort measures   Abnormal TSH- (present on admission) Recent TSH from 12/18 of 0.143 with free T4 of 1.39 (elevated) and T3 of 57 (low). Concerning for  hyperthyroidism. Repeat TSH and free T4 are normal. Comfort measures.    DVT prophylaxis: Comfort care Code Status: DNR Family Communication: Pt  in room, family is at bedside  Status is: Inpatient  Remains inpatient appropriate because: Severity of illness    Consultants:  Palliative Care General Surgery  Procedures:  Hydrotherapy  Antimicrobials: Anti-infectives (From admission, onward)    Start     Dose/Rate Route Frequency Ordered Stop   04/05/21 1000  vancomycin (VANCOREADY) IVPB 1500 mg/300 mL  Status:  Discontinued        1,500 mg 150 mL/hr over 120 Minutes Intravenous Every 24 hours 04/04/21 1117 04/05/21 1420   04/03/21 1000  vancomycin (VANCOREADY) IVPB 1250 mg/250 mL  Status:  Discontinued        1,250 mg 166.7 mL/hr over 90 Minutes Intravenous Every 24 hours 04/02/21 1204 04/04/21 1117   04/03/21 1000  meropenem (MERREM) 1 g in sodium chloride 0.9 % 100 mL IVPB  Status:  Discontinued        1 g 200 mL/hr over 30 Minutes Intravenous Every 8 hours 04/03/21 0838 04/05/21 1420   04/02/21 1200  vancomycin (VANCOREADY) IVPB 1500 mg/300 mL  Status:  Discontinued        1,500 mg 150 mL/hr over 120 Minutes Intravenous Every 48 hours 03/24/2021 1308 04/01/21 0920   04/01/21 1200  vancomycin (VANCOREADY) IVPB 1000 mg/200 mL  Status:  Discontinued        1,000 mg 200 mL/hr over 60 Minutes Intravenous Daily 04/01/21 0920 04/02/21 1204   04/01/21 1000  ceFEPIme (MAXIPIME) 2 g in sodium chloride 0.9 % 100 mL IVPB  Status:  Discontinued        2 g 200 mL/hr over 30 Minutes Intravenous Every 12 hours 04/01/21 0924 04/03/21 0939   03/27/2021 1400  ceFEPIme (MAXIPIME) 2 g in sodium chloride 0.9 % 100 mL IVPB  Status:  Discontinued        2 g 200 mL/hr over 30 Minutes Intravenous Every 24 hours 04/02/2021 1305 04/01/21 0924   03/21/2021 1400  metroNIDAZOLE (FLAGYL) IVPB 500 mg  Status:  Discontinued        500 mg 100 mL/hr over 60 Minutes Intravenous Every 12 hours 04/07/2021 1305 04/03/21  1238   04/07/2021 1130  vancomycin (VANCOREADY) IVPB 1500 mg/300 mL        1,500 mg 150 mL/hr over 120 Minutes Intravenous  Once 04/05/2021 1124 03/22/2021 1432   03/23/2021 1115  cefTRIAXone (ROCEPHIN) 2 g in sodium chloride 0.9 % 100 mL IVPB        2 g 200 mL/hr over 30 Minutes Intravenous  Once 03/21/2021 1107 03/27/2021 1204       Subjective: Cannot assess as pt is non-verbal  Objective: Vitals:   04/08/21 2122 04/09/21 1942 04/10/21 0957 04/11/21 0552  BP: 135/78 136/85 129/72 126/68  Pulse: 84 84 85 83  Resp: 15 18 18 12   Temp: 98 F (36.7 C) 97.7 F (36.5 C) 98 F (36.7 C) 98 F (36.7 C)  TempSrc: Axillary Axillary Axillary Oral  SpO2: 98% 96% 97% 97%  Weight:      Height:        Intake/Output Summary (Last 24 hours) at 04/11/2021 1506 Last data filed at 04/11/2021 0755 Gross per 24 hour  Intake 0 ml  Output 1250 ml  Net -1250 ml    Filed Weights   04/06/2021 0935 03/30/2021 2217 04/03/21 1513  Weight: 81.4 kg 71.9 kg 73 kg    Examination: General exam: eyes open, laying in bed, in nad Respiratory system: Normal respiratory effort, no wheezing Skin: Normal skin turgor, no notable skin lesions  seen  Data Reviewed: I have personally reviewed following labs and imaging studies  CBC: Recent Labs  Lab 04/05/21 0352  WBC 17.8*  HGB 9.9*  HCT 32.2*  MCV 95.0  PLT 124    Basic Metabolic Panel: Recent Labs  Lab 04/05/21 0352  NA 144  K 3.5  CL 119*  CO2 21*  GLUCOSE 136*  BUN 28*  CREATININE 0.54*  CALCIUM 8.3*    GFR: Estimated Creatinine Clearance: 71 mL/min (A) (by C-G formula based on SCr of 0.54 mg/dL (L)). Liver Function Tests: No results for input(s): AST, ALT, ALKPHOS, BILITOT, PROT, ALBUMIN in the last 168 hours.  No results for input(s): LIPASE, AMYLASE in the last 168 hours. No results for input(s): AMMONIA in the last 168 hours. Coagulation Profile: No results for input(s): INR, PROTIME in the last 168 hours.  Cardiac Enzymes: No results  for input(s): CKTOTAL, CKMB, CKMBINDEX, TROPONINI in the last 168 hours. BNP (last 3 results) No results for input(s): PROBNP in the last 8760 hours. HbA1C: No results for input(s): HGBA1C in the last 72 hours. CBG: Recent Labs  Lab 04/05/21 0451 04/05/21 0506  GLUCAP 131* 125*    Lipid Profile: No results for input(s): CHOL, HDL, LDLCALC, TRIG, CHOLHDL, LDLDIRECT in the last 72 hours. Thyroid Function Tests: No results for input(s): TSH, T4TOTAL, FREET4, T3FREE, THYROIDAB in the last 72 hours. Anemia Panel: No results for input(s): VITAMINB12, FOLATE, FERRITIN, TIBC, IRON, RETICCTPCT in the last 72 hours. Sepsis Labs: No results for input(s): PROCALCITON, LATICACIDVEN in the last 168 hours.   No results found for this or any previous visit (from the past 240 hour(s)).     Radiology Studies: No results found.  Scheduled Meds:  Chlorhexidine Gluconate Cloth  6 each Topical Daily   glycopyrrolate  0.3 mg Intravenous Q4H   LORazepam  1 mg Intravenous Q12H    morphine injection  2 mg Intravenous Q4H   Continuous Infusions:   LOS: 11 days   Marylu Lund, MD [facility] Pager On Amion  If 7PM-7AM, please contact night-coverage 04/11/2021, 3:06 PM

## 2021-04-12 DIAGNOSIS — G9341 Metabolic encephalopathy: Secondary | ICD-10-CM | POA: Diagnosis not present

## 2021-04-12 DIAGNOSIS — N179 Acute kidney failure, unspecified: Secondary | ICD-10-CM | POA: Diagnosis not present

## 2021-04-12 MED ORDER — MORPHINE 100MG IN NS 100ML (1MG/ML) PREMIX INFUSION
2.0000 mg/h | INTRAVENOUS | Status: DC
  Administered 2021-04-12 – 2021-04-21 (×5): 2 mg/h via INTRAVENOUS
  Filled 2021-04-12 (×5): qty 100

## 2021-04-12 MED ORDER — MORPHINE BOLUS VIA INFUSION
2.0000 mg | INTRAVENOUS | Status: DC | PRN
  Administered 2021-04-12 – 2021-04-13 (×5): 2 mg via INTRAVENOUS
  Filled 2021-04-12: qty 2

## 2021-04-12 NOTE — Progress Notes (Signed)
PROGRESS NOTE    Erik Lara  TTS:[id number] DOB: 1936-02-25 DOA: 03/29/2021 PCP: Dorothyann Peng, NP    Brief Narrative:  86 y.o. male with a history of hyperlipidemia, lewy body dementia, diabetes mellitus, sacral pressure injury. Patient presented secondary to altered mental status and shortness of breath found to have evidence of sepsis secondary to a sacral decubitus ulcer. Blood cultures significant for bacteroides thetaiotaomicron. General surgery on board for debridement of decubitus ulcer. Patient is now transitioned to comfort measures.  Assessment & Plan:   * Sepsis ([place])- (present on admission) Present on admission. Likely secondary to sacral decubitus ulcer. Procalcitonin of 0.31. Afebrile with leukocytosis. Antibiotics were initially started on admission and blood cultures obtained. One set of blood cultures significant for Bacteroides Thetaiotaomicron.  -Pt has been continued on comfort measures with anticipation for hospital death   Decubitus ulcer of sacral region, unstageable [facility]) Concern that this is a source for infection/sepsis. General surgery consulted. CT pelvis significant for no abscess formation. Started empirically on Vancomycin, Cefepime and Flagyl. Patient developed worsening leukocytosis and continued fevers with subsequent change to meropenem IV.  -Pt has since been transitioned to comfort measures.   Hypernatremia-resolved as of 04/05/2021 Unsure of chronicity. Secondary to poor oral intake. Initial worsening with peak of 152, later improved with IV fluids. Comfort measures.   Hydronephrosis Likely related to acute urinary retention. Left greater than right. Associated AKI on admission.   AKI (acute kidney injury) ([place])- Creatinine of 2.42 on admission. Secondary to acute urinary retention in addition to decreased oral intake. Foley catheter placed and IV fluids given with improved creatinine. -continue foley cath for comfort   Pressure injury of  skin- (present on admission) Left pretibial, left heel, right heel, medial sacrum, anterior/left ankle, anterior/left knee, posterior/right elbow.  -Was seen by wound care this visit   COVID-19 virus infection- (present on admission) Previously diagnosed on 02/26/2021. Currently asymptomatic. No treatment initiated this admission. Chest x-ray without acute process.   Lewy body dementia ([place])- (present on admission) Patient follows with neurology. Held [place] and Aricept this visit   Type 2 diabetes mellitus without complication, without long-term current use of insulin ([place]) Well controlled. Last hemoglobin A1C of 5.7% from 12/22.   Hyperlipidemia- (present on admission) Hold home Crestor   Goals of care, counseling/discussion While at SNF, patient was referred to hospice; this was apparently not known by family at the time and they have not accepted hospice care. Patient remains unable to eat/drink secondary to severe encephalopathy. Patient also shows signs of dehydration complicated by poor albumin/third spacing.   1/23: Transitioned to full comfort measures with plan to discharge to hospice facility.   Protein-calorie malnutrition, severe Dietitian consulted. Patient is currently NPO secondary to mental status.   Acute urinary retention Unknown etiology. Bladder scan in the ED was significant for 1200 mL of measured urine. Foley catheter placed. Urine culture with no growth. Associated left moderate and right mild hydronephrosis. -Continue foley catheter for comfort   Acute metabolic encephalopathy Possibly multifactorial in setting of underlying dementia, hypernatremia, acute infection. CT head unremarkable for acute process and EEG significant for evidence of diffuse cerebral dysfunction but no evidence of seizure activity.  -Now focus on comfort measures   Abnormal TSH- (present on admission) Recent TSH from 12/18 of 0.143 with free T4 of 1.39 (elevated) and T3 of 57 (low).  Concerning for hyperthyroidism. Repeat TSH and free T4 are normal. Comfort measures.    DVT prophylaxis: Comfort care Code Status: DNR  Family Communication: Pt in room, family is at bedside  Status is: Inpatient  Remains inpatient appropriate because: Severity of illness    Consultants:  Palliative Care General Surgery  Procedures:  Hydrotherapy  Antimicrobials: Anti-infectives (From admission, onward)    Start     Dose/Rate Route Frequency Ordered Stop   04/05/21 1000  vancomycin (VANCOREADY) IVPB 1500 mg/300 mL  Status:  Discontinued        1,500 mg 150 mL/hr over 120 Minutes Intravenous Every 24 hours 04/04/21 1117 04/05/21 1420   04/03/21 1000  vancomycin (VANCOREADY) IVPB 1250 mg/250 mL  Status:  Discontinued        1,250 mg 166.7 mL/hr over 90 Minutes Intravenous Every 24 hours 04/02/21 1204 04/04/21 1117   04/03/21 1000  meropenem (MERREM) 1 g in sodium chloride 0.9 % 100 mL IVPB  Status:  Discontinued        1 g 200 mL/hr over 30 Minutes Intravenous Every 8 hours 04/03/21 0838 04/05/21 1420   04/02/21 1200  vancomycin (VANCOREADY) IVPB 1500 mg/300 mL  Status:  Discontinued        1,500 mg 150 mL/hr over 120 Minutes Intravenous Every 48 hours 04/07/2021 1308 04/01/21 0920   04/01/21 1200  vancomycin (VANCOREADY) IVPB 1000 mg/200 mL  Status:  Discontinued        1,000 mg 200 mL/hr over 60 Minutes Intravenous Daily 04/01/21 0920 04/02/21 1204   04/01/21 1000  ceFEPIme (MAXIPIME) 2 g in sodium chloride 0.9 % 100 mL IVPB  Status:  Discontinued        2 g 200 mL/hr over 30 Minutes Intravenous Every 12 hours 04/01/21 0924 04/03/21 0939   03/30/2021 1400  ceFEPIme (MAXIPIME) 2 g in sodium chloride 0.9 % 100 mL IVPB  Status:  Discontinued        2 g 200 mL/hr over 30 Minutes Intravenous Every 24 hours 03/28/2021 1305 04/01/21 0924   04/03/2021 1400  metroNIDAZOLE (FLAGYL) IVPB 500 mg  Status:  Discontinued        500 mg 100 mL/hr over 60 Minutes Intravenous Every 12 hours  03/19/2021 1305 04/03/21 1238   03/15/2021 1130  vancomycin (VANCOREADY) IVPB 1500 mg/300 mL        1,500 mg 150 mL/hr over 120 Minutes Intravenous  Once 04/01/2021 1124 03/14/2021 1432   04/10/2021 1115  cefTRIAXone (ROCEPHIN) 2 g in sodium chloride 0.9 % 100 mL IVPB        2 g 200 mL/hr over 30 Minutes Intravenous  Once 04/13/2021 1107 03/22/2021 1204       Subjective: Unable to assess as pt is not verbal  Objective: Vitals:   04/10/21 0957 04/11/21 0552 04/11/21 2011 04/12/21 0840  BP: 129/72 126/68 133/69 108/63  Pulse: 85 83 86 83  Resp: 18 12 12 14   Temp: 98 F (36.7 C) 98 F (36.7 C) 98.2 F (36.8 C) 98.2 F (36.8 C)  TempSrc: Axillary Oral Oral Oral  SpO2: 97% 97% 98% 97%  Weight:      Height:        Intake/Output Summary (Last 24 hours) at 04/12/2021 1701 Last data filed at 04/12/2021 0700 Gross per 24 hour  Intake --  Output 525 ml  Net -525 ml    Filed Weights   03/17/2021 0935 03/25/2021 2217 04/03/21 1513  Weight: 81.4 kg 71.9 kg 73 kg    Examination: General exam: eyes remain open, laying in bed, in nad Respiratory system: Normal respiratory effort, no wheezing Psychiatry: Unable to  assess as pt is not verbal  Data Reviewed: I have personally reviewed following labs and imaging studies  CBC: No results for input(s): WBC, NEUTROABS, HGB, HCT, MCV, PLT in the last 168 hours.  Basic Metabolic Panel: No results for input(s): NA, K, CL, CO2, GLUCOSE, BUN, CREATININE, CALCIUM, MG, PHOS in the last 168 hours.  GFR: Estimated Creatinine Clearance: 71 mL/min (A) (by C-G formula based on SCr of 0.54 mg/dL (L)). Liver Function Tests: No results for input(s): AST, ALT, ALKPHOS, BILITOT, PROT, ALBUMIN in the last 168 hours.  No results for input(s): LIPASE, AMYLASE in the last 168 hours. No results for input(s): AMMONIA in the last 168 hours. Coagulation Profile: No results for input(s): INR, PROTIME in the last 168 hours.  Cardiac Enzymes: No results for input(s):  CKTOTAL, CKMB, CKMBINDEX, TROPONINI in the last 168 hours. BNP (last 3 results) No results for input(s): PROBNP in the last 8760 hours. HbA1C: No results for input(s): HGBA1C in the last 72 hours. CBG: No results for input(s): GLUCAP in the last 168 hours.  Lipid Profile: No results for input(s): CHOL, HDL, LDLCALC, TRIG, CHOLHDL, LDLDIRECT in the last 72 hours. Thyroid Function Tests: No results for input(s): TSH, T4TOTAL, FREET4, T3FREE, THYROIDAB in the last 72 hours. Anemia Panel: No results for input(s): VITAMINB12, FOLATE, FERRITIN, TIBC, IRON, RETICCTPCT in the last 72 hours. Sepsis Labs: No results for input(s): PROCALCITON, LATICACIDVEN in the last 168 hours.   No results found for this or any previous visit (from the past 240 hour(s)).     Radiology Studies: No results found.  Scheduled Meds:  Chlorhexidine Gluconate Cloth  6 each Topical Daily   glycopyrrolate  0.3 mg Intravenous Q4H   LORazepam  1 mg Intravenous Q12H    morphine injection  2 mg Intravenous Q4H   Continuous Infusions:   LOS: 12 days   Marylu Lund, MD [facility] Pager On Amion  If 7PM-7AM, please contact night-coverage 04/12/2021, 5:01 PM

## 2021-04-12 NOTE — Care Management Important Message (Signed)
Important Message  Patient Details IM Letter placed in Patients room. Name: Erik Lara MRN: [id number] Date of Birth: 11-11-1935   Medicare Important Message Given:  Yes     Kerin Salen 04/12/2021, 2:57 PM

## 2021-04-13 DIAGNOSIS — G9341 Metabolic encephalopathy: Secondary | ICD-10-CM | POA: Diagnosis not present

## 2021-04-13 MED ORDER — POLYVINYL ALCOHOL 1.4 % OP SOLN
1.0000 [drp] | Freq: Four times a day (QID) | OPHTHALMIC | Status: DC
  Administered 2021-04-13 – 2021-04-21 (×29): 1 [drp] via OPHTHALMIC
  Filled 2021-04-13: qty 15

## 2021-04-13 NOTE — Progress Notes (Signed)
PROGRESS NOTE    Erik Lara  MOL:[id number] DOB: 1935-10-13 DOA: 03/19/2021 PCP: Dorothyann Peng, NP    Brief Narrative:  86 y.o. male with a history of hyperlipidemia, lewy body dementia, diabetes mellitus, sacral pressure injury. Patient presented secondary to altered mental status and shortness of breath found to have evidence of sepsis secondary to a sacral decubitus ulcer. Blood cultures significant for bacteroides thetaiotaomicron. General surgery on board for debridement of decubitus ulcer. Patient is now transitioned to comfort measures.  Assessment & Plan:   * Sepsis ([place])- (present on admission) Present on admission. Likely secondary to sacral decubitus ulcer. Procalcitonin of 0.31. Afebrile with leukocytosis. Antibiotics were initially started on admission and blood cultures obtained. One set of blood cultures significant for Bacteroides Thetaiotaomicron.  -discussed with Palliative Care 1/30. Cont as planned with anticipation for hospital death   Decubitus ulcer of sacral region, unstageable [facility]) Concern that this is a source for infection/sepsis. General surgery consulted. CT pelvis significant for no abscess formation. Started empirically on Vancomycin, Cefepime and Flagyl. Patient developed worsening leukocytosis and continued fevers with subsequent change to meropenem IV.  -Pt has since been transitioned to comfort measures.   Hypernatremia-resolved as of 04/05/2021 Unsure of chronicity. Secondary to poor oral intake. Initial worsening with peak of 152, later improved with IV fluids. Comfort measures.   Hydronephrosis Likely related to acute urinary retention. Left greater than right. Associated AKI on admission.   AKI (acute kidney injury) ([place])- Creatinine of 2.42 on admission. Secondary to acute urinary retention in addition to decreased oral intake. Foley catheter placed and IV fluids given with improved creatinine. -continue foley cath for comfort   Pressure  injury of skin- (present on admission) Left pretibial, left heel, right heel, medial sacrum, anterior/left ankle, anterior/left knee, posterior/right elbow.  -Was seen by wound care this visit   COVID-19 virus infection- (present on admission) Previously diagnosed on 02/26/2021. Currently asymptomatic. No treatment initiated this admission. Chest x-ray without acute process.   Lewy body dementia ([place])- (present on admission) Patient follows with neurology. Held [place] and Aricept this visit   Type 2 diabetes mellitus without complication, without long-term current use of insulin ([place]) Well controlled. Last hemoglobin A1C of 5.7% from 12/22.   Hyperlipidemia- (present on admission) Hold home Crestor   Goals of care, counseling/discussion While at SNF, patient was referred to hospice; this was apparently not known by family at the time and they have not accepted hospice care. Patient remains unable to eat/drink secondary to severe encephalopathy. Patient also shows signs of dehydration complicated by poor albumin/third spacing.   1/23: Transitioned to full comfort measures with plan to discharge to hospice facility.   Protein-calorie malnutrition, severe Dietitian consulted. Patient is currently NPO secondary to mental status.   Acute urinary retention Unknown etiology. Bladder scan in the ED was significant for 1200 mL of measured urine. Foley catheter placed. Urine culture with no growth. Associated left moderate and right mild hydronephrosis. -Continue foley catheter for comfort   Acute metabolic encephalopathy Possibly multifactorial in setting of underlying dementia, hypernatremia, acute infection. CT head unremarkable for acute process and EEG significant for evidence of diffuse cerebral dysfunction but no evidence of seizure activity.  -Now focus on comfort measures   Abnormal TSH- (present on admission) Recent TSH from 12/18 of 0.143 with free T4 of 1.39 (elevated) and T3 of  57 (low). Concerning for hyperthyroidism. Repeat TSH and free T4 are normal. Comfort measures.    DVT prophylaxis: Comfort care Code Status:  DNR Family Communication: Pt in room, family is at bedside  Status is: Inpatient  Remains inpatient appropriate because: Severity of illness    Consultants:  Palliative Care General Surgery  Procedures:  Hydrotherapy  Antimicrobials: Anti-infectives (From admission, onward)    Start     Dose/Rate Route Frequency Ordered Stop   04/05/21 1000  vancomycin (VANCOREADY) IVPB 1500 mg/300 mL  Status:  Discontinued        1,500 mg 150 mL/hr over 120 Minutes Intravenous Every 24 hours 04/04/21 1117 04/05/21 1420   04/03/21 1000  vancomycin (VANCOREADY) IVPB 1250 mg/250 mL  Status:  Discontinued        1,250 mg 166.7 mL/hr over 90 Minutes Intravenous Every 24 hours 04/02/21 1204 04/04/21 1117   04/03/21 1000  meropenem (MERREM) 1 g in sodium chloride 0.9 % 100 mL IVPB  Status:  Discontinued        1 g 200 mL/hr over 30 Minutes Intravenous Every 8 hours 04/03/21 0838 04/05/21 1420   04/02/21 1200  vancomycin (VANCOREADY) IVPB 1500 mg/300 mL  Status:  Discontinued        1,500 mg 150 mL/hr over 120 Minutes Intravenous Every 48 hours 03/20/2021 1308 04/01/21 0920   04/01/21 1200  vancomycin (VANCOREADY) IVPB 1000 mg/200 mL  Status:  Discontinued        1,000 mg 200 mL/hr over 60 Minutes Intravenous Daily 04/01/21 0920 04/02/21 1204   04/01/21 1000  ceFEPIme (MAXIPIME) 2 g in sodium chloride 0.9 % 100 mL IVPB  Status:  Discontinued        2 g 200 mL/hr over 30 Minutes Intravenous Every 12 hours 04/01/21 0924 04/03/21 0939   04/11/2021 1400  ceFEPIme (MAXIPIME) 2 g in sodium chloride 0.9 % 100 mL IVPB  Status:  Discontinued        2 g 200 mL/hr over 30 Minutes Intravenous Every 24 hours 03/22/2021 1305 04/01/21 0924   03/21/2021 1400  metroNIDAZOLE (FLAGYL) IVPB 500 mg  Status:  Discontinued        500 mg 100 mL/hr over 60 Minutes Intravenous Every 12  hours 03/19/2021 1305 04/03/21 1238   03/29/2021 1130  vancomycin (VANCOREADY) IVPB 1500 mg/300 mL        1,500 mg 150 mL/hr over 120 Minutes Intravenous  Once 03/22/2021 1124 03/16/2021 1432   03/22/2021 1115  cefTRIAXone (ROCEPHIN) 2 g in sodium chloride 0.9 % 100 mL IVPB        2 g 200 mL/hr over 30 Minutes Intravenous  Once 04/05/2021 1107 04/10/2021 1204       Subjective: Cannot assess given mentation  Objective: Vitals:   04/11/21 2011 04/12/21 0840 04/12/21 2135 04/13/21 1205  BP: 133/69 108/63 128/65 103/64  Pulse: 86 83 87 85  Resp: 12 14 14 14   Temp: 98.2 F (36.8 C) 98.2 F (36.8 C) 98.7 F (37.1 C) 98.6 F (37 C)  TempSrc: Oral Oral Oral Oral  SpO2: 98% 97% 99% 93%  Weight:      Height:        Intake/Output Summary (Last 24 hours) at 04/13/2021 1416 Last data filed at 04/13/2021 1230 Gross per 24 hour  Intake 0 ml  Output 250 ml  Net -250 ml    Filed Weights   03/29/2021 0935 04/09/2021 2217 04/03/21 1513  Weight: 81.4 kg 71.9 kg 73 kg    Examination: General exam: Not conversant, in no acute distress Respiratory system: normal chest rise, clear, no audible wheezing Psychiatry: Unable to assess given  mentation  Data Reviewed: I have personally reviewed following labs and imaging studies  CBC: No results for input(s): WBC, NEUTROABS, HGB, HCT, MCV, PLT in the last 168 hours.  Basic Metabolic Panel: No results for input(s): NA, K, CL, CO2, GLUCOSE, BUN, CREATININE, CALCIUM, MG, PHOS in the last 168 hours.  GFR: Estimated Creatinine Clearance: 71 mL/min (A) (by C-G formula based on SCr of 0.54 mg/dL (L)). Liver Function Tests: No results for input(s): AST, ALT, ALKPHOS, BILITOT, PROT, ALBUMIN in the last 168 hours.  No results for input(s): LIPASE, AMYLASE in the last 168 hours. No results for input(s): AMMONIA in the last 168 hours. Coagulation Profile: No results for input(s): INR, PROTIME in the last 168 hours.  Cardiac Enzymes: No results for input(s):  CKTOTAL, CKMB, CKMBINDEX, TROPONINI in the last 168 hours. BNP (last 3 results) No results for input(s): PROBNP in the last 8760 hours. HbA1C: No results for input(s): HGBA1C in the last 72 hours. CBG: No results for input(s): GLUCAP in the last 168 hours.  Lipid Profile: No results for input(s): CHOL, HDL, LDLCALC, TRIG, CHOLHDL, LDLDIRECT in the last 72 hours. Thyroid Function Tests: No results for input(s): TSH, T4TOTAL, FREET4, T3FREE, THYROIDAB in the last 72 hours. Anemia Panel: No results for input(s): VITAMINB12, FOLATE, FERRITIN, TIBC, IRON, RETICCTPCT in the last 72 hours. Sepsis Labs: No results for input(s): PROCALCITON, LATICACIDVEN in the last 168 hours.   No results found for this or any previous visit (from the past 240 hour(s)).     Radiology Studies: No results found.  Scheduled Meds:  glycopyrrolate  0.3 mg Intravenous Q4H   LORazepam  1 mg Intravenous Q12H   Continuous Infusions:  morphine 2 mg/hr (04/12/21 1843)     LOS: 13 days   Marylu Lund, MD [facility] Pager On Amion  If 7PM-7AM, please contact night-coverage 04/13/2021, 2:16 PM

## 2021-04-13 NOTE — TOC Progression Note (Signed)
Transition of Care [facility]) - Progression Note    Patient Details  Name: Erik Lara MRN: [id number] Date of Birth: [birth year]  Transition of Care [facility]) CM/SW Contact  Purcell Mouton, RN Phone Number: 04/13/2021, 2:08 PM  Clinical Narrative:    Pt continue to not be stable to transport/MD.    Expected Discharge Plan: [place] Barriers to Discharge: Continued Medical Work up  Expected Discharge Plan and Services Expected Discharge Plan: [place]   Discharge Planning Services: CM Consult Post Acute Care Choice: [place] Living arrangements for the past 2 months: [place] ([place])                                       Social Determinants of Health (SDOH) Interventions    Readmission Risk Interventions No flowsheet data found.

## 2021-04-13 NOTE — Progress Notes (Signed)
Report received from ongoing nurse. Agreed with nurse assessment of patient and will cont to monitor. 

## 2021-04-14 DIAGNOSIS — A419 Sepsis, unspecified organism: Secondary | ICD-10-CM | POA: Diagnosis not present

## 2021-04-14 DIAGNOSIS — R627 Adult failure to thrive: Secondary | ICD-10-CM

## 2021-04-14 DIAGNOSIS — R6520 Severe sepsis without septic shock: Secondary | ICD-10-CM | POA: Diagnosis not present

## 2021-04-14 DIAGNOSIS — Z7189 Other specified counseling: Secondary | ICD-10-CM | POA: Diagnosis not present

## 2021-04-14 NOTE — Progress Notes (Signed)
Palliative Care Follow-up Note  Erik Lara continues to be unresponsive. He was transitioned to comfort care on 1/23. He is now 9 days without any oral or IV hydration. >14 days without significant nutrition or intake. He has a large infected sacral decubitus and is receiving palliative wound care interventions. He is now requiring a continuous infusion of IV morphine to maintain his comfort and is also receiving scheduled ativan for sedation. He has no on-verbal signs of discomfort but has his eyes open most of the day and night in fixed position and is not tracking, even with stimulation. His respirations are very shallow-overall very poor air movement. He hypotensive with last read 105/59, saturations are 94% on room air, respiratory rate is 22. He has mottling of his extremities.  A decision was made shortly after his transition to comfort care that he was too unstable for transport to a hospice facility and he was anticipated to have a fairly rapid decline.   Despite our best effort at prognostication and judgement of his stability for transport,  he has languished for almost 10 days in a state of active prolonged dying. He has far exceeded my hours to days prognosis for reasons that surpass medical explanation. He remains extremely high risk for death during transport and this far in to the dying process I do not advise moving him to a hospice IPU.  I have updated his family on his condition, provided communication and emotional support to his family during this very difficult time. They want him to have relief from his suffering and peace. Right now they hope for him to not continue to linger in his current state. They understand that all efforts to keep him comfortable are being done and that his prolonged dying experience was unexpected, unanticipated and likely not a reflection of his medical condition, perhaps related to unresolved existential issues.  Recommendations: Maintain Comfort  Measures Only Maintain current morphine infusion and bolus when providing wound care or repositioning. Anticipate hospital death. It is physiologically unlikely he will survive beyond 14 days without fluids or nutrition and in the setting of systemic infection from large sacral wound.   Erik Hacker, DO Palliative Medicine   Time: 25 minutes

## 2021-04-14 NOTE — Progress Notes (Addendum)
Manufacturing engineer [facility]) Hospital Liaison Note  [facility] continues to follow patient for dispo planning. Previously, patient not stable for transport. MSW to continue to collaborate with [facility] for DC plan as [facility] staff continue to monitor stabilization for transport.  Addendum(2:45p) Bed offered for EOL care at [facility]. Family (spouse, Graiden Henes) declined and reports that they will contact [facility] if they wish to pursue bed.  Please do not hesitate to call with any hospice related questions.    Thank you for the opportunity to participate in this patient's care.  Daphene Calamity, MSW [facility] Liaison  [phone]

## 2021-04-14 NOTE — Progress Notes (Signed)
Progress Note    Erik Lara   [id number]:[id number]  DOB: [birth year]  DOA: 03/30/2021     14 PCP: Dorothyann Peng, NP  Initial CC: AMS, SOB  Hospital Course: Erik Lara is 86 y.o. male with a history of hyperlipidemia, lewy body dementia, diabetes mellitus, sacral pressure injury. Patient presented secondary to altered mental status and shortness of breath found to have evidence of sepsis secondary to a sacral decubitus ulcer. Blood cultures significant for bacteroides thetaiotaomicron. General surgery initially consulted as well for decubitus ulcer. Patient is now transitioned to comfort measures.  Interval History:  Notes overnight.  Patient's wife and son are bedside this morning.  No questions this morning.  He was resting in bed appearing comfortable and minimally responsive.  They were offered a bed at [facility] but declined.  Assessment and Plan: * Sepsis ([place])- (present on admission) Present on admission. Likely secondary to sacral decubitus ulcer. Procalcitonin of 0.31. Afebrile with leukocytosis. Antibiotics initiated on admission and blood cultures obtained. One set of blood cultures significant for Bacteroides Thetaiotaomicron. Leukocytosis continues improved with change in antibiotics. Patient now transitioned to comfort measures.  COVID-19 virus infection- (present on admission) Previously diagnosed on 02/26/2021. Currently asymptomatic. No treatment initiated this admission. Chest x-ray without acute process.  Lewy body dementia ([place])- (present on admission) Patient follows with neurology. Hold Namenda and Aricept  Type 2 diabetes mellitus without complication, without long-term current use of insulin (HCC) Well controlled. Last hemoglobin A1C of 5.7% from 12/22.  Hyperlipidemia- (present on admission) Hold home Crestor  Pressure injury of skin- (present on admission) Left pretibial, left heel, right heel, medial sacrum, anterior/left ankle, anterior/left knee,  posterior/right elbow. Wound care consulted for wound management recommendations.  Goals of care, counseling/discussion While at SNF, patient was referred to hospice; this was apparently not known by family at the time and they have not accepted hospice care. Patient is currently unable to eat/drink secondary to severe encephalopathy. Patient also shows signs of dehydration complicated by poor albumin/third spacing.  1/23: Transition to full comfort measures with plan to discharge to hospice facility.  Protein-calorie malnutrition, severe Dietitian consulted. Patient is currently NPO secondary to mental status.  Hydronephrosis Likely related to acute urinary retention. Left greater than right. Associated AKI on admission.  Acute urinary retention Unknown etiology. Bladder scan in the ED was significant for 1200 mL of measured urine. Foley catheter placed. Urine culture with no growth. Associated left moderate and right mild hydronephrosis. -Continue foley catheter for comfort  Decubitus ulcer of sacral region, unstageable [facility]) Concern that this is a source for infection/sepsis. General surgery consulted. CT pelvis significant for no abscess formation. Started empirically on Vancomycin, Cefepime and Flagyl. Patient developed worsening leukocytosis and continued fevers with subsequent change to meropenem IV. Now transitioned to comfort measures.  Acute metabolic encephalopathy Possibly multifactorial in setting of underlying dementia, hypernatremia, acute infection. CT head unremarkable for acute process and EEG significant for evidence of diffuse cerebral dysfunction but no evidence of seizure activity. Some improvement with some ability to somewhat follow simple commands.  Abnormal TSH- (present on admission) Recent TSH from 12/18 of 0.143 with free T4 of 1.39 (elevated) and T3 of 57 (low). Concerning for hyperthyroidism. Repeat TSH and free T4 are normal. Comfort measures.  AKI (acute  kidney injury) (HCC)-resolved as of 04/03/2021 Creatinine of 2.42 on admission. Secondary to acute urinary retention in addition to decreased oral intake. Foley catheter placed and IV fluids given with improved creatinine.  Hypernatremia-resolved as  of 04/05/2021 Unsure of chronicity. Secondary to poor oral intake. Initial worsening with peak of 152, now resolved with IV fluids. Comfort measures.   Old records reviewed in assessment of this patient  Antimicrobials:   DVT prophylaxis:   Code Status:   Code Status: DNR  Disposition Plan:  Comfort care inpatient Status is: Inpt  Objective: Blood pressure (!) 105/59, pulse 88, temperature 98.3 F (36.8 C), temperature source Oral, resp. rate (!) 22, height 6\' 1"  (1.854 m), weight 73 kg, SpO2 94 %.  Examination:  Physical Exam Constitutional:      Appearance: Normal appearance.     Comments: NAD; appears comfortable. Minimally responsive, but eyes spontaneously open some to voice  HENT:     Head: Normocephalic and atraumatic.     Mouth/Throat:     Mouth: Mucous membranes are dry.  Eyes:     Extraocular Movements: Extraocular movements intact.  Cardiovascular:     Rate and Rhythm: Normal rate and regular rhythm.  Pulmonary:     Effort: Pulmonary effort is normal.     Breath sounds: Normal breath sounds.  Abdominal:     General: Bowel sounds are normal. There is no distension.     Palpations: Abdomen is soft.     Tenderness: There is no abdominal tenderness.  Musculoskeletal:        General: Normal range of motion.     Cervical back: Normal range of motion and neck supple.  Skin:    General: Skin is warm and dry.  Neurological:     Comments: Minimal movements spontaneously      Consultants:    Procedures:    Data Reviewed: I have Reviewed nursing notes, Vitals, and Lab results since pt's last encounter. Pertinent lab results n/a   LOS: 14 days   Dwyane Dee, MD [facility] 04/14/2021, 5:42 PM

## 2021-04-14 NOTE — TOC Progression Note (Signed)
Transition of Care [facility]) - Progression Note    Patient Details  Name: Erik Lara MRN: [id number] Date of Birth: 08-31-1935  Transition of Care [facility]) CM/SW Contact  Purcell Mouton, RN Phone Number: 04/14/2021, 2:51 PM  Clinical Narrative:     Pt's family declined pt to transfer to [facility]/CSW with [facility].   Expected Discharge Plan: [place] Barriers to Discharge: Continued Medical Work up  Expected Discharge Plan and Services Expected Discharge Plan: [place]   Discharge Planning Services: CM Consult Post Acute Care Choice: [place] Living arrangements for the past 2 months: [place] ([place])                                       Social Determinants of Health (SDOH) Interventions    Readmission Risk Interventions No flowsheet data found.

## 2021-04-14 DEATH — deceased

## 2021-04-15 DIAGNOSIS — A419 Sepsis, unspecified organism: Secondary | ICD-10-CM | POA: Diagnosis not present

## 2021-04-15 DIAGNOSIS — N179 Acute kidney failure, unspecified: Secondary | ICD-10-CM | POA: Diagnosis not present

## 2021-04-15 DIAGNOSIS — R6520 Severe sepsis without septic shock: Secondary | ICD-10-CM | POA: Diagnosis not present

## 2021-04-15 NOTE — Progress Notes (Signed)
Progress Note    Erik Lara   [id number]:[id number]  DOB: [birth year]  DOA: 04/06/2021     15 PCP: Dorothyann Peng, NP  Initial CC: AMS, SOB  Hospital Course: Erik Lara is 86 y.o. male with a history of hyperlipidemia, lewy body dementia, diabetes mellitus, sacral pressure injury. Patient presented secondary to altered mental status and shortness of breath found to have evidence of sepsis secondary to a sacral decubitus ulcer. Blood cultures significant for bacteroides thetaiotaomicron. General surgery initially consulted as well for decubitus ulcer. Patient is now transitioned to comfort measures.  Interval History:  No events overnight.  Patient's wife and son are bedside this morning.  No questions.  I have reviewed Dr. Hilma Favors note from 2/1, at this time patient not considered stable enough for transport to residential hospice, and patient will remain in hospital.   Assessment and Plan: * Sepsis [facility])- (present on admission) Present on admission. Likely secondary to sacral decubitus ulcer. Procalcitonin of 0.31. Afebrile with leukocytosis. Antibiotics initiated on admission and blood cultures obtained. One set of blood cultures significant for Bacteroides Thetaiotaomicron. Leukocytosis improved with change in antibiotics.  - Patient now transitioned to comfort measures. - per palliative care, not considered stable for discharge to residential hospice; recommendation is to remain inpatient for anticipated in-hospital death  Acute metabolic encephalopathy Possibly multifactorial in setting of underlying dementia, hypernatremia, acute infection. CT head unremarkable for acute process and EEG significant for evidence of diffuse cerebral dysfunction but no evidence of seizure activity.  Goals of care, counseling/discussion While at SNF, patient was referred to hospice; this was apparently not known by family at the time and they have not accepted hospice care. Patient is currently  unable to eat/drink secondary to severe encephalopathy. Patient also shows signs of dehydration complicated by poor albumin/third spacing.  1/23: Transition to full comfort measures.  2/1: Recommended to remain inpatient per palliative care for in-hospital death  Lewy body dementia ([place])- (present on admission) Patient follows with neurology. Hold Namenda and Aricept  Type 2 diabetes mellitus without complication, without long-term current use of insulin (HCC) Well controlled. Last hemoglobin A1C of 5.7% from 12/22.  Hyperlipidemia- (present on admission) Hold home Crestor  Pressure injury of skin- (present on admission) Left pretibial, left heel, right heel, medial sacrum, anterior/left ankle, anterior/left knee, posterior/right elbow.  Protein-calorie malnutrition, severe Dietitian consulted. Patient is currently NPO secondary to mental status.  Hydronephrosis Likely related to acute urinary retention. Left greater than right. Associated AKI on admission.  Acute urinary retention Unknown etiology. Bladder scan in the ED was significant for 1200 mL of measured urine. Foley catheter placed. Urine culture with no growth. Associated left moderate and right mild hydronephrosis. -Continue foley catheter for comfort  Decubitus ulcer of sacral region, unstageable [facility]) Concern that this is a source for infection/sepsis. General surgery consulted. CT pelvis significant for no abscess formation. Started empirically on Vancomycin, Cefepime and Flagyl. Patient developed worsening leukocytosis and continued fevers with subsequent change to meropenem IV. Now transitioned to comfort measures.  Abnormal TSH- (present on admission) Recent TSH from 12/18 of 0.143 with free T4 of 1.39 (elevated) and T3 of 57 (low). Concerning for hyperthyroidism. Repeat TSH and free T4 are normal. Comfort measures.  COVID-19 virus infection- (present on admission) Previously diagnosed on 02/26/2021. Currently  asymptomatic. No treatment initiated this admission. Chest x-ray without acute process.  AKI (acute kidney injury) (HCC)-resolved as of 04/03/2021 Creatinine of 2.42 on admission. Secondary to acute urinary retention in addition to  decreased oral intake. Foley catheter placed and IV fluids given with improved creatinine.  Hypernatremia-resolved as of 04/05/2021 Unsure of chronicity. Secondary to poor oral intake. Initial worsening with peak of 152, now resolved with IV fluids. Comfort measures.   Old records reviewed in assessment of this patient  Antimicrobials:   DVT prophylaxis:   Code Status:   Code Status: DNR  Disposition Plan:  Comfort care inpatient Status is: Inpt  Objective: Blood pressure (!) 90/51, pulse 93, temperature 99 F (37.2 C), temperature source Oral, resp. rate 18, height 6\' 1"  (1.854 m), weight 73 kg, SpO2 93 %.  Examination:  Physical Exam Constitutional:      Appearance: Normal appearance.     Comments: NAD; appears comfortable. Minimally responsive, but eyes spontaneously open some to voice  HENT:     Head: Normocephalic and atraumatic.     Mouth/Throat:     Mouth: Mucous membranes are dry.  Eyes:     Extraocular Movements: Extraocular movements intact.  Cardiovascular:     Rate and Rhythm: Normal rate and regular rhythm.  Pulmonary:     Effort: Pulmonary effort is normal.     Breath sounds: Normal breath sounds.  Abdominal:     General: Bowel sounds are normal. There is no distension.     Palpations: Abdomen is soft.     Tenderness: There is no abdominal tenderness.  Musculoskeletal:        General: Normal range of motion.     Cervical back: Normal range of motion and neck supple.  Skin:    General: Skin is warm and dry.  Neurological:     Comments: Minimal movements spontaneously      Consultants:    Procedures:    Data Reviewed: I have Reviewed nursing notes, Vitals, and Lab results since pt's last encounter. Pertinent lab results  n/a   LOS: 15 days   Dwyane Dee, MD [facility] 04/15/2021, 6:26 PM

## 2021-04-15 NOTE — Plan of Care (Signed)
Will continue with comfort care 

## 2021-04-15 NOTE — Progress Notes (Signed)
Wound care not completed. Patient resting comfortably.

## 2021-04-15 NOTE — Progress Notes (Signed)
Manufacturing engineer [facility]) Hospital Liaison Note   Per MD/[place] documentation, patient is not stable for transport and is actively passing and would likely pass in transport. No further communication will be made to family in regards to transfer unless advised otherwise by hospital IDT.   Please do not hesitate to call with any hospice related questions.    Thank you for the opportunity to participate in this patient's care.   Daphene Calamity, MSW [facility] Liaison  [phone]

## 2021-04-16 DIAGNOSIS — A419 Sepsis, unspecified organism: Secondary | ICD-10-CM | POA: Diagnosis not present

## 2021-04-16 DIAGNOSIS — R6520 Severe sepsis without septic shock: Secondary | ICD-10-CM | POA: Diagnosis not present

## 2021-04-16 DIAGNOSIS — N179 Acute kidney failure, unspecified: Secondary | ICD-10-CM | POA: Diagnosis not present

## 2021-04-16 NOTE — Progress Notes (Signed)
Progress Note    AARION METZGAR   [id number]:[id number]  DOB: May 04, 1935  DOA: 04/03/2021     16 PCP: Dorothyann Peng, NP  Initial CC: AMS, SOB  Hospital Course: JARRID LIENHARD is 86 y.o. male with a history of hyperlipidemia, lewy body dementia, diabetes mellitus, sacral pressure injury. Patient presented secondary to altered mental status and shortness of breath found to have evidence of sepsis secondary to a sacral decubitus ulcer. Blood cultures significant for bacteroides thetaiotaomicron. General surgery initially consulted as well for decubitus ulcer. Patient is now transitioned to comfort measures.  Interval History:   I have reviewed Dr. Hilma Favors note from 2/1, at this time patient not considered stable enough for transport to residential hospice, and patient will remain in hospital.   As of today, his respirations now are starting to slow some compared to prior also, and he'd be unstable to transport at this time. Will remain in hospital.   Assessment and Plan: * Sepsis ([place])- (present on admission) Present on admission. Likely secondary to sacral decubitus ulcer. Procalcitonin of 0.31. Afebrile with leukocytosis. Antibiotics initiated on admission and blood cultures obtained. One set of blood cultures significant for Bacteroides Thetaiotaomicron. Leukocytosis improved with change in antibiotics.  - Patient now transitioned to comfort measures. - per palliative care, not considered stable for discharge to residential hospice; recommendation is to remain inpatient for anticipated in-hospital death  Acute metabolic encephalopathy Possibly multifactorial in setting of underlying dementia, hypernatremia, acute infection. CT head unremarkable for acute process and EEG significant for evidence of diffuse cerebral dysfunction but no evidence of seizure activity.  Goals of care, counseling/discussion While at SNF, patient was referred to hospice; this was apparently not known by family at  the time and they have not accepted hospice care. Patient is currently unable to eat/drink secondary to severe encephalopathy. Patient also shows signs of dehydration complicated by poor albumin/third spacing.  1/23: Transition to full comfort measures.  2/1: Recommended to remain inpatient per palliative care for in-hospital death  Lewy body dementia ([place])- (present on admission) Patient follows with neurology. Hold Namenda and Aricept  Type 2 diabetes mellitus without complication, without long-term current use of insulin (HCC) Well controlled. Last hemoglobin A1C of 5.7% from 12/22.  Hyperlipidemia- (present on admission) Hold home Crestor  Pressure injury of skin- (present on admission) Left pretibial, left heel, right heel, medial sacrum, anterior/left ankle, anterior/left knee, posterior/right elbow.  Protein-calorie malnutrition, severe Dietitian consulted. Patient is currently NPO secondary to mental status.  Hydronephrosis Likely related to acute urinary retention. Left greater than right. Associated AKI on admission.  Acute urinary retention Unknown etiology. Bladder scan in the ED was significant for 1200 mL of measured urine. Foley catheter placed. Urine culture with no growth. Associated left moderate and right mild hydronephrosis. -Continue foley catheter for comfort  Decubitus ulcer of sacral region, unstageable [facility]) Concern that this is a source for infection/sepsis. General surgery consulted. CT pelvis significant for no abscess formation. Started empirically on Vancomycin, Cefepime and Flagyl. Patient developed worsening leukocytosis and continued fevers with subsequent change to meropenem IV. Now transitioned to comfort measures.  Abnormal TSH- (present on admission) Recent TSH from 12/18 of 0.143 with free T4 of 1.39 (elevated) and T3 of 57 (low). Concerning for hyperthyroidism. Repeat TSH and free T4 are normal. Comfort measures.  COVID-19 virus infection-  (present on admission) Previously diagnosed on 02/26/2021. Currently asymptomatic. No treatment initiated this admission. Chest x-ray without acute process.  AKI (acute kidney injury) (HCC)-resolved as  of 04/03/2021 Creatinine of 2.42 on admission. Secondary to acute urinary retention in addition to decreased oral intake. Foley catheter placed and IV fluids given with improved creatinine.  Hypernatremia-resolved as of 04/05/2021 Unsure of chronicity. Secondary to poor oral intake. Initial worsening with peak of 152, now resolved with IV fluids. Comfort measures.   Old records reviewed in assessment of this patient  Antimicrobials:   DVT prophylaxis:   Code Status:   Code Status: DNR  Disposition Plan:  Comfort care inpatient Status is: Inpt  Objective: Blood pressure (!) 87/47, pulse 87, temperature 97.6 F (36.4 C), temperature source Oral, resp. rate 18, height 6\' 1"  (1.854 m), weight 73 kg, SpO2 98 %.  Examination:  Physical Exam Constitutional:      Appearance: Normal appearance.     Comments: NAD; appears comfortable. Minimally responsive, but eyes spontaneously open some to voice  HENT:     Head: Normocephalic and atraumatic.     Mouth/Throat:     Mouth: Mucous membranes are dry.  Eyes:     Extraocular Movements: Extraocular movements intact.  Cardiovascular:     Rate and Rhythm: Normal rate and regular rhythm.  Pulmonary:     Effort: Pulmonary effort is normal.     Breath sounds: Normal breath sounds.     Comments: Respirations slower Abdominal:     General: Bowel sounds are normal. There is no distension.     Palpations: Abdomen is soft.     Tenderness: There is no abdominal tenderness.  Musculoskeletal:        General: Normal range of motion.     Cervical back: Normal range of motion and neck supple.  Skin:    General: Skin is warm and dry.  Neurological:     Comments: No movements spontaneously      Consultants:  Palliative care   Procedures:     Data Reviewed: I have Reviewed nursing notes, Vitals, and Lab results since pt's last encounter. Pertinent lab results n/a   LOS: 16 days   Dwyane Dee, MD [facility] 04/16/2021, 2:48 PM

## 2021-04-16 NOTE — Progress Notes (Signed)
Manufacturing engineer [facility]) Hospital Liaison Note   Per MD/[place] documentation, patient is not stable for transport and is actively passing and would likely pass in transport. No further communication will be made to family in regards to transfer unless advised otherwise by hospital IDT.    Please do not hesitate to call with any hospice related questions.    Thank you for the opportunity to participate in this patient's care.  Buck Mam [facility] Liaison [phone]

## 2021-04-17 DIAGNOSIS — R6520 Severe sepsis without septic shock: Secondary | ICD-10-CM | POA: Diagnosis not present

## 2021-04-17 DIAGNOSIS — Z7189 Other specified counseling: Secondary | ICD-10-CM | POA: Diagnosis not present

## 2021-04-17 DIAGNOSIS — G9341 Metabolic encephalopathy: Secondary | ICD-10-CM | POA: Diagnosis not present

## 2021-04-17 DIAGNOSIS — A419 Sepsis, unspecified organism: Secondary | ICD-10-CM | POA: Diagnosis not present

## 2021-04-17 NOTE — Progress Notes (Signed)
Progress Note    Erik Lara   [id number]:[id number]  DOB: [birth year]  DOA: 04/02/2021     17 PCP: Erik Peng, NP  Initial CC: AMS, SOB  Hospital Course: Erik Lara is 86 y.o. male with a history of hyperlipidemia, lewy body dementia, diabetes mellitus, sacral pressure injury. Patient presented secondary to altered mental status and shortness of breath found to have evidence of sepsis secondary to a sacral decubitus ulcer. Blood cultures significant for bacteroides thetaiotaomicron. General surgery initially consulted as well for decubitus ulcer. Patient is now transitioned to comfort measures.  Interval History:   I have reviewed Dr. Hilma Favors note from 2/1, at this time patient not considered stable enough for transport to residential hospice, and patient will remain in hospital.   Wife and son bedside. Continues to have shallow respirations but not agonal at this time. Still unresponsive. Family had no questions.   Assessment and Plan: * Sepsis ([place])- (present on admission) Present on admission. Likely secondary to sacral decubitus ulcer. Procalcitonin of 0.31. Afebrile with leukocytosis. Antibiotics initiated on admission and blood cultures obtained. One set of blood cultures significant for Bacteroides Thetaiotaomicron. Leukocytosis improved with change in antibiotics.  - Patient now transitioned to comfort measures. - per palliative care, not considered stable for discharge to residential hospice; recommendation is to remain inpatient for anticipated in-hospital death  Acute metabolic encephalopathy Possibly multifactorial in setting of underlying dementia, hypernatremia, acute infection. CT head unremarkable for acute process and EEG significant for evidence of diffuse cerebral dysfunction but no evidence of seizure activity.  Goals of care, counseling/discussion While at SNF, patient was referred to hospice; this was apparently not known by family at the time and they have  not accepted hospice care. Patient is currently unable to eat/drink secondary to severe encephalopathy. Patient also shows signs of dehydration complicated by poor albumin/third spacing.  1/23: Transition to full comfort measures.  2/1: Recommended to remain inpatient per palliative care for in-hospital death  Lewy body dementia ([place])- (present on admission) Patient follows with neurology. Hold Namenda and Aricept  Type 2 diabetes mellitus without complication, without long-term current use of insulin (HCC) Well controlled. Last hemoglobin A1C of 5.7% from 12/22.  Hyperlipidemia- (present on admission) Hold home Crestor  Pressure injury of skin- (present on admission) Left pretibial, left heel, right heel, medial sacrum, anterior/left ankle, anterior/left knee, posterior/right elbow.  Protein-calorie malnutrition, severe Dietitian consulted. Patient is currently NPO secondary to mental status.  Hydronephrosis Likely related to acute urinary retention. Left greater than right. Associated AKI on admission.  Acute urinary retention Unknown etiology. Bladder scan in the ED was significant for 1200 mL of measured urine. Foley catheter placed. Urine culture with no growth. Associated left moderate and right mild hydronephrosis. -Continue foley catheter for comfort  Decubitus ulcer of sacral region, unstageable [facility]) Concern that this is a source for infection/sepsis. General surgery consulted. CT pelvis significant for no abscess formation. Started empirically on Vancomycin, Cefepime and Flagyl. Patient developed worsening leukocytosis and continued fevers with subsequent change to meropenem IV. Now transitioned to comfort measures.  Abnormal TSH- (present on admission) Recent TSH from 12/18 of 0.143 with free T4 of 1.39 (elevated) and T3 of 57 (low). Concerning for hyperthyroidism. Repeat TSH and free T4 are normal. Comfort measures.  COVID-19 virus infection- (present on  admission) Previously diagnosed on 02/26/2021. Currently asymptomatic. No treatment initiated this admission. Chest x-ray without acute process.  AKI (acute kidney injury) (HCC)-resolved as of 04/03/2021 Creatinine of 2.42 on admission.  Secondary to acute urinary retention in addition to decreased oral intake. Foley catheter placed and IV fluids given with improved creatinine.  Hypernatremia-resolved as of 04/05/2021 Unsure of chronicity. Secondary to poor oral intake. Initial worsening with peak of 152, now resolved with IV fluids. Comfort measures.   Old records reviewed in assessment of this patient  Antimicrobials:   DVT prophylaxis:   Code Status:   Code Status: DNR  Disposition Plan:  Comfort care inpatient Status is: Inpt  Objective: Blood pressure (!) 92/50, pulse 84, temperature 97.8 F (36.6 C), temperature source Axillary, resp. rate 18, height 6\' 1"  (1.854 m), weight 73 kg, SpO2 97 %.  Examination:  Physical Exam Constitutional:      Appearance: Normal appearance.     Comments: NAD; appears comfortable. Nonresponsive   HENT:     Head: Normocephalic and atraumatic.     Mouth/Throat:     Mouth: Mucous membranes are dry.  Eyes:     Extraocular Movements: Extraocular movements intact.  Cardiovascular:     Rate and Rhythm: Normal rate and regular rhythm.  Pulmonary:     Effort: Pulmonary effort is normal.     Breath sounds: Normal breath sounds.     Comments: Respirations slower Abdominal:     General: Bowel sounds are normal. There is no distension.     Palpations: Abdomen is soft.     Tenderness: There is no abdominal tenderness.  Musculoskeletal:        General: Normal range of motion.     Cervical back: Normal range of motion and neck supple.  Skin:    General: Skin is warm and dry.  Neurological:     Comments: No movements spontaneously      Consultants:  Palliative care   Procedures:    Data Reviewed: I have Reviewed nursing notes, Vitals, and  Lab results since pt's last encounter. Pertinent lab results n/a   LOS: 17 days   Dwyane Dee, MD [facility] 04/17/2021, 5:35 PM

## 2021-04-17 NOTE — Progress Notes (Signed)
PMT progress note  Patient remains on comfort measures, is actively dying, no family at bedside.  BP (!) 92/50 (BP Location: Left Arm)    Pulse 84    Temp 97.8 F (36.6 C) (Axillary)    Resp 18    Ht 6\' 1"  (1.854 m)    Wt 73 kg    SpO2 97%    BMI 21.24 kg/m  Chart reviewed Shallow breaths Eyes open Does not arouse or interact Discussed with Dr Sabino Gasser about comfort care.  86 yo with Lewy body dementia DM sacral pressure injury sepsis on admit, has been on comfort measures.  Anticipate hospital death Continue comfort measures 15 minutes spent.  Greater than 50%  of this time was spent counseling and coordinating care related to the above assessment and plan. Loistine Chance MD [place] palliative.

## 2021-04-17 NOTE — Progress Notes (Signed)
[employer]ufacturing [employer] [facility]) Hospital Liaison Note   Per MD/[place] documentation, patient is not stable for transport and is actively passing and would likely pass in transport. No further communication will be made to family in regards to transfer unless advised otherwise by hospital IDT.    Please do not hesitate to call with any hospice related questions.    Thank you for the opportunity to participate in this patient's care.   Buck Mam [place] [facility] Liaison [phone]

## 2021-04-18 DIAGNOSIS — N179 Acute kidney failure, unspecified: Secondary | ICD-10-CM | POA: Diagnosis not present

## 2021-04-18 DIAGNOSIS — Z7189 Other specified counseling: Secondary | ICD-10-CM | POA: Diagnosis not present

## 2021-04-18 DIAGNOSIS — R6520 Severe sepsis without septic shock: Secondary | ICD-10-CM | POA: Diagnosis not present

## 2021-04-18 DIAGNOSIS — A419 Sepsis, unspecified organism: Secondary | ICD-10-CM | POA: Diagnosis not present

## 2021-04-18 DIAGNOSIS — G9341 Metabolic encephalopathy: Secondary | ICD-10-CM | POA: Diagnosis not present

## 2021-04-18 NOTE — Progress Notes (Signed)
Manufacturing engineer [facility]) Hospital Liaison Note   Per documentation, patient is not stable for transport and is actively passing and would likely pass in transport. No further communication will be made to family in regards to transfer unless advised otherwise by hospital IDT.    Please do not hesitate to call with any hospice related questions.    Thank you for the opportunity to participate in this patient's care.   Clementeen Hoof, BSN, RN [facility] Liaison [phone]

## 2021-04-18 NOTE — Progress Notes (Signed)
Progress Note    Erik Lara   [id number]:[id number]  DOB: 08-05-1935  DOA: 03/30/2021     18 PCP: Dorothyann Peng, NP  Initial CC: AMS, SOB  Hospital Course: Erik Lara is 86 y.o. male with a history of hyperlipidemia, lewy body dementia, diabetes mellitus, sacral pressure injury. Patient presented secondary to altered mental status and shortness of breath found to have evidence of sepsis secondary to a sacral decubitus ulcer. Blood cultures significant for bacteroides thetaiotaomicron. General surgery initially consulted as well for decubitus ulcer. Patient is now transitioned to comfort measures.  Interval History:   I have reviewed Dr. Hilma Favors note from 2/1, at this time patient not considered stable enough for transport to residential hospice, and patient will remain in hospital.   No family bedside when seen this morning.  Remains comfortably resting in bed noninteractive.  Morphine drip running.  Mouth open, shallow respirations.  Assessment and Plan: * Sepsis ([place])- (present on admission) Present on admission. Likely secondary to sacral decubitus ulcer. Procalcitonin of 0.31. Afebrile with leukocytosis. Antibiotics initiated on admission and blood cultures obtained. One set of blood cultures significant for Bacteroides Thetaiotaomicron. Leukocytosis improved with change in antibiotics.  - Patient now transitioned to comfort measures. - per palliative care, not considered stable for discharge to residential hospice; recommendation is to remain inpatient for anticipated in-hospital death  Acute metabolic encephalopathy Possibly multifactorial in setting of underlying dementia, hypernatremia, acute infection. CT head unremarkable for acute process and EEG significant for evidence of diffuse cerebral dysfunction but no evidence of seizure activity.  Goals of care, counseling/discussion While at SNF, patient was referred to hospice; this was apparently not known by family at the  time and they have not accepted hospice care. Patient is currently unable to eat/drink secondary to severe encephalopathy. Patient also shows signs of dehydration complicated by poor albumin/third spacing.  1/23: Transition to full comfort measures.  2/1: Recommended to remain inpatient per palliative care for in-hospital death  Lewy body dementia ([place])- (present on admission) Patient follows with neurology. Hold Namenda and Aricept  Type 2 diabetes mellitus without complication, without long-term current use of insulin (HCC) Well controlled. Last hemoglobin A1C of 5.7% from 12/22.  Hyperlipidemia- (present on admission) Hold home Crestor  Pressure injury of skin- (present on admission) Left pretibial, left heel, right heel, medial sacrum, anterior/left ankle, anterior/left knee, posterior/right elbow.  Protein-calorie malnutrition, severe Dietitian consulted. Patient is currently NPO secondary to mental status.  Hydronephrosis Likely related to acute urinary retention. Left greater than right. Associated AKI on admission.  Acute urinary retention Unknown etiology. Bladder scan in the ED was significant for 1200 mL of measured urine. Foley catheter placed. Urine culture with no growth. Associated left moderate and right mild hydronephrosis. -Continue foley catheter for comfort  Decubitus ulcer of sacral region, unstageable [facility]) Concern that this is a source for infection/sepsis. General surgery consulted. CT pelvis significant for no abscess formation. Started empirically on Vancomycin, Cefepime and Flagyl. Patient developed worsening leukocytosis and continued fevers with subsequent change to meropenem IV. Now transitioned to comfort measures.  Abnormal TSH- (present on admission) Recent TSH from 12/18 of 0.143 with free T4 of 1.39 (elevated) and T3 of 57 (low). Concerning for hyperthyroidism. Repeat TSH and free T4 are normal. Comfort measures.  COVID-19 virus infection- (present  on admission) Previously diagnosed on 02/26/2021. Currently asymptomatic. No treatment initiated this admission. Chest x-ray without acute process.  AKI (acute kidney injury) (HCC)-resolved as of 04/03/2021 Creatinine of 2.42 on  admission. Secondary to acute urinary retention in addition to decreased oral intake. Foley catheter placed and IV fluids given with improved creatinine.  Hypernatremia-resolved as of 04/05/2021 Unsure of chronicity. Secondary to poor oral intake. Initial worsening with peak of 152, now resolved with IV fluids. Comfort measures.   Old records reviewed in assessment of this patient  Antimicrobials:   DVT prophylaxis:   Code Status:   Code Status: DNR  Disposition Plan:  Comfort care inpatient Status is: Inpt  Objective: Blood pressure (!) 86/52, pulse 81, temperature (!) 97.5 F (36.4 C), temperature source Oral, resp. rate 14, height 6\' 1"  (1.854 m), weight 73 kg, SpO2 97 %.  Examination:  Physical Exam Constitutional:      Appearance: Normal appearance.     Comments: appears comfortable. Nonresponsive   HENT:     Head: Normocephalic and atraumatic.     Mouth/Throat:     Mouth: Mucous membranes are dry.  Eyes:     Extraocular Movements: Extraocular movements intact.  Cardiovascular:     Rate and Rhythm: Normal rate and regular rhythm.  Pulmonary:     Effort: Pulmonary effort is normal.     Breath sounds: Normal breath sounds.     Comments: Respirations slower Abdominal:     General: Bowel sounds are normal. There is no distension.     Palpations: Abdomen is soft.     Tenderness: There is no abdominal tenderness.  Musculoskeletal:        General: Normal range of motion.     Cervical back: Normal range of motion and neck supple.  Skin:    General: Skin is warm and dry.  Neurological:     Comments: No movements spontaneously      Consultants:  Palliative care   Procedures:    Data Reviewed: I have Reviewed nursing notes, Vitals, and  Lab results since pt's last encounter. Pertinent lab results n/a   LOS: 18 days   Dwyane Dee, MD [facility] 04/18/2021, 1:28 PM

## 2021-04-18 NOTE — Plan of Care (Signed)
Discussed in front of patient plan of care for the evening, pain management and comfort measures with no evidence of learning.    Problem: Education: Goal: Knowledge of General Education information will improve Description: Including pain rating scale, medication(s)/side effects and non-pharmacologic comfort measures Outcome: Not Progressing

## 2021-04-18 NOTE — Progress Notes (Signed)
PMT progress note  Patient remains on comfort measures, is actively dying, no family at bedside at the time of my visit this morning, upward gaze, eyes open, shallow breathing, remains comfortable on Morphine infusion.  BP (!) 85/50 (BP Location: Left Arm)    Pulse 80    Temp 97.6 F (36.4 C) (Axillary)    Resp 12    Ht 6\' 1"  (1.854 m)    Wt 73 kg    SpO2 97%    BMI 21.24 kg/m  Chart reviewed Shallow breaths Eyes open Does not arouse or interact comfort care.  86 yo with Lewy body dementia DM sacral pressure injury sepsis on admit, has been on comfort measures.  Anticipate hospital death Continue comfort measures 15 minutes spent.  Greater than 50%  of this time was spent counseling and coordinating care related to the above assessment and plan. Loistine Chance MD [place] palliative.

## 2021-04-19 DIAGNOSIS — A419 Sepsis, unspecified organism: Secondary | ICD-10-CM | POA: Diagnosis not present

## 2021-04-19 DIAGNOSIS — N179 Acute kidney failure, unspecified: Secondary | ICD-10-CM | POA: Diagnosis not present

## 2021-04-19 DIAGNOSIS — R6520 Severe sepsis without septic shock: Secondary | ICD-10-CM | POA: Diagnosis not present

## 2021-04-19 NOTE — TOC Progression Note (Signed)
Transition of Care [facility]) - Progression Note    Patient Details  Name: ONYX SCHIRMER MRN: [id number] Date of Birth: [birth year]  [facility] CM/SW Contact  Leeroy Cha, RN Phone Number: 04/19/2021, 11:32 AM  Clinical Narrative:    Comfort care on oing following for toc needs    Expected Discharge Plan: [place] Barriers to Discharge: Continued Medical Work up  Expected Discharge Plan and Services Expected Discharge Plan: [place]   Discharge Planning Services: CM Consult Post Acute Care Choice: [place] Living arrangements for the past 2 months: [place] ([place])                                       Social Determinants of Health (SDOH) Interventions    Readmission Risk Interventions No flowsheet data found.

## 2021-04-19 NOTE — Progress Notes (Signed)
Chaplain offered a prayer over [place].  Chaplain is available to offer support to him and family.     04/19/21 1200  Clinical Encounter Type  Visited With Patient;Health care provider  Visit Type Initial;Spiritual support  Spiritual Encounters  Spiritual Needs Prayer

## 2021-04-19 NOTE — Progress Notes (Signed)
Progress Note    Erik Lara   [id number]  DOB: 07/02/1935  DOA: 03/27/2021     19 PCP: Dorothyann Peng, NP  Initial CC: AMS, SOB  Hospital Course: Erik Lara is 86 y.o. male with a history of hyperlipidemia, lewy body dementia, diabetes mellitus, sacral pressure injury. Patient presented secondary to altered mental status and shortness of breath found to have evidence of sepsis secondary to a sacral decubitus ulcer. Blood cultures significant for bacteroides thetaiotaomicron. General surgery initially consulted as well for decubitus ulcer. Patient is now transitioned to comfort measures.  Interval History:   I have reviewed Dr. Hilma Favors note from 2/1, at this time patient not considered stable enough for transport to residential hospice, and patient will remain in hospital.   No family bedside when seen this morning.  Remains comfortably resting in bed noninteractive.  Morphine drip running.  Mouth open, shallow respirations. No changes overnight otherwise.   Assessment and Plan: * Sepsis ([place])- (present on admission) Present on admission. Likely secondary to sacral decubitus ulcer. Procalcitonin of 0.31. Afebrile with leukocytosis. Antibiotics initiated on admission and blood cultures obtained. One set of blood cultures significant for Bacteroides Thetaiotaomicron. Leukocytosis improved with change in antibiotics.  - Patient now transitioned to comfort measures. - per palliative care, not considered stable for discharge to residential hospice; recommendation is to remain inpatient for anticipated in-hospital death  Acute metabolic encephalopathy Possibly multifactorial in setting of underlying dementia, hypernatremia, acute infection. CT head unremarkable for acute process and EEG significant for evidence of diffuse cerebral dysfunction but no evidence of seizure activity.  Goals of care, counseling/discussion While at SNF, patient was referred to hospice; this was  apparently not known by family at the time and they have not accepted hospice care. Patient is currently unable to eat/drink secondary to severe encephalopathy. Patient also shows signs of dehydration complicated by poor albumin/third spacing.  1/23: Transition to full comfort measures.  2/1: Recommended to remain inpatient per palliative care for in-hospital death  Lewy body dementia ([place])- (present on admission) Patient follows with neurology. Hold Namenda and Aricept  Type 2 diabetes mellitus without complication, without long-term current use of insulin (HCC) Well controlled. Last hemoglobin A1C of 5.7% from 12/22.  Hyperlipidemia- (present on admission) Hold home Crestor  Pressure injury of skin- (present on admission) Left pretibial, left heel, right heel, medial sacrum, anterior/left ankle, anterior/left knee, posterior/right elbow.  Protein-calorie malnutrition, severe Dietitian consulted. Patient is currently NPO secondary to mental status.  Hydronephrosis Likely related to acute urinary retention. Left greater than right. Associated AKI on admission.  Acute urinary retention Unknown etiology. Bladder scan in the ED was significant for 1200 mL of measured urine. Foley catheter placed. Urine culture with no growth. Associated left moderate and right mild hydronephrosis. -Continue foley catheter for comfort  Decubitus ulcer of sacral region, unstageable [facility]) Concern that this is a source for infection/sepsis. General surgery consulted. CT pelvis significant for no abscess formation. Started empirically on Vancomycin, Cefepime and Flagyl. Patient developed worsening leukocytosis and continued fevers with subsequent change to meropenem IV. Now transitioned to comfort measures.  Abnormal TSH- (present on admission) Recent TSH from 12/18 of 0.143 with free T4 of 1.39 (elevated) and T3 of 57 (low). Concerning for hyperthyroidism. Repeat TSH and free T4 are normal. Comfort  measures.  COVID-19 virus infection- (present on admission) Previously diagnosed on 02/26/2021. Currently asymptomatic. No treatment initiated this admission. Chest x-ray without acute process.  AKI (acute kidney injury) (HCC)-resolved as of  04/03/2021 Creatinine of 2.42 on admission. Secondary to acute urinary retention in addition to decreased oral intake. Foley catheter placed and IV fluids given with improved creatinine.  Hypernatremia-resolved as of 04/05/2021 Unsure of chronicity. Secondary to poor oral intake. Initial worsening with peak of 152, now resolved with IV fluids. Comfort measures.   Old records reviewed in assessment of this patient  Antimicrobials:   DVT prophylaxis:   Code Status:   Code Status: DNR  Disposition Plan:  Comfort care inpatient Status is: Inpt  Objective: Blood pressure (!) 74/41, pulse 90, temperature (!) 97.4 F (36.3 C), temperature source Oral, resp. rate 18, height 6\' 1"  (1.854 m), weight 73 kg, SpO2 90 %.  Examination:  Physical Exam Constitutional:      Appearance: Normal appearance.     Comments: appears comfortable. Nonresponsive   HENT:     Head: Normocephalic and atraumatic.     Mouth/Throat:     Mouth: Mucous membranes are dry.  Eyes:     Extraocular Movements: Extraocular movements intact.  Cardiovascular:     Rate and Rhythm: Normal rate and regular rhythm.  Pulmonary:     Effort: Pulmonary effort is normal.     Breath sounds: Normal breath sounds.     Comments: Respirations slower Abdominal:     General: Bowel sounds are normal. There is no distension.     Palpations: Abdomen is soft.     Tenderness: There is no abdominal tenderness.  Musculoskeletal:        General: Normal range of motion.     Cervical back: Normal range of motion and neck supple.  Skin:    General: Skin is warm and dry.  Neurological:     Comments: No movements spontaneously      Consultants:  Palliative care   Procedures:    Data  Reviewed: I have Reviewed nursing notes, Vitals, and Lab results since pt's last encounter. Pertinent lab results n/a   LOS: 50 days   Dwyane Dee, MD [facility] 04/19/2021, 2:31 PM

## 2021-04-20 DIAGNOSIS — A419 Sepsis, unspecified organism: Secondary | ICD-10-CM | POA: Diagnosis not present

## 2021-04-20 DIAGNOSIS — F0280 Dementia in other diseases classified elsewhere without behavioral disturbance: Secondary | ICD-10-CM | POA: Diagnosis not present

## 2021-04-20 DIAGNOSIS — R6520 Severe sepsis without septic shock: Secondary | ICD-10-CM | POA: Diagnosis not present

## 2021-04-20 DIAGNOSIS — G3183 Dementia with Lewy bodies: Secondary | ICD-10-CM | POA: Diagnosis not present

## 2021-04-20 NOTE — Progress Notes (Signed)
Progress Note    Erik Lara   KYH:[id number]  DOB: 1935/03/23  DOA: 04/12/2021     20 PCP: Dorothyann Peng, NP  Initial CC: AMS, SOB  Hospital Course: Erik Lara is 86 y.o. male with a history of hyperlipidemia, lewy body dementia, diabetes mellitus, sacral pressure injury. Patient presented secondary to altered mental status and shortness of breath found to have evidence of sepsis secondary to a sacral decubitus ulcer. Blood cultures significant for bacteroides thetaiotaomicron. General surgery initially consulted as well for decubitus ulcer. Patient is now transitioned to comfort measures.  Interval History:   I have reviewed Dr. Hilma Favors note from 2/1, at this time patient not considered stable enough for transport to residential hospice, and patient will remain in hospital.   No family bedside when seen this morning.  Remains comfortably resting in bed noninteractive.  Morphine drip running.  Mouth open, shallow respirations. No changes overnight otherwise.   Assessment and Plan: * Sepsis ([place])- (present on admission) Present on admission. Likely secondary to sacral decubitus ulcer. Procalcitonin of 0.31. Afebrile with leukocytosis. Antibiotics initiated on admission and blood cultures obtained. One set of blood cultures significant for Bacteroides Thetaiotaomicron. Leukocytosis improved with change in antibiotics.  - Patient now transitioned to comfort measures. - per palliative care, not considered stable for discharge to residential hospice; recommendation is to remain inpatient for anticipated in-hospital death  Acute metabolic encephalopathy Possibly multifactorial in setting of underlying dementia, hypernatremia, acute infection. CT head unremarkable for acute process and EEG significant for evidence of diffuse cerebral dysfunction but no evidence of seizure activity.  Goals of care, counseling/discussion While at SNF, patient was referred to hospice; this was  apparently not known by family at the time and they have not accepted hospice care. Patient is currently unable to eat/drink secondary to severe encephalopathy. Patient also shows signs of dehydration complicated by poor albumin/third spacing.  1/23: Transition to full comfort measures.  2/1: Recommended to remain inpatient per palliative care for in-hospital death  Lewy body dementia ([place])- (present on admission) Patient follows with neurology. Hold Namenda and Aricept  Type 2 diabetes mellitus without complication, without long-term current use of insulin (HCC) Well controlled. Last hemoglobin A1C of 5.7% from 12/22.  Hyperlipidemia- (present on admission) Hold home Crestor  Pressure injury of skin- (present on admission) Left pretibial, left heel, right heel, medial sacrum, anterior/left ankle, anterior/left knee, posterior/right elbow.  Protein-calorie malnutrition, severe Dietitian consulted. Patient is currently NPO secondary to mental status.  Hydronephrosis Likely related to acute urinary retention. Left greater than right. Associated AKI on admission.  Acute urinary retention Unknown etiology. Bladder scan in the ED was significant for 1200 mL of measured urine. Foley catheter placed. Urine culture with no growth. Associated left moderate and right mild hydronephrosis. -Continue foley catheter for comfort  Decubitus ulcer of sacral region, unstageable [facility]) Concern that this is a source for infection/sepsis. General surgery consulted. CT pelvis significant for no abscess formation. Started empirically on Vancomycin, Cefepime and Flagyl. Patient developed worsening leukocytosis and continued fevers with subsequent change to meropenem IV. Now transitioned to comfort measures.  Abnormal TSH- (present on admission) Recent TSH from 12/18 of 0.143 with free T4 of 1.39 (elevated) and T3 of 57 (low). Concerning for hyperthyroidism. Repeat TSH and free T4 are normal. Comfort  measures.  COVID-19 virus infection- (present on admission) Previously diagnosed on 02/26/2021. Currently asymptomatic. No treatment initiated this admission. Chest x-ray without acute process.  AKI (acute kidney injury) (HCC)-resolved as of  04/03/2021 Creatinine of 2.42 on admission. Secondary to acute urinary retention in addition to decreased oral intake. Foley catheter placed and IV fluids given with improved creatinine.  Hypernatremia-resolved as of 04/05/2021 Unsure of chronicity. Secondary to poor oral intake. Initial worsening with peak of 152, now resolved with IV fluids. Comfort measures.   Old records reviewed in assessment of this patient  Antimicrobials:   DVT prophylaxis:   Code Status:   Code Status: DNR  Disposition Plan:  Comfort care inpatient Status is: Inpt  Objective: Blood pressure (!) 64/41, pulse 67, temperature 98 F (36.7 C), resp. rate (!) 21, height 6\' 1"  (1.854 m), weight 73 kg, SpO2 (!) 87 %.  Examination:  Physical Exam Constitutional:      Appearance: Normal appearance.     Comments: appears comfortable. Nonresponsive   HENT:     Head: Normocephalic and atraumatic.     Mouth/Throat:     Mouth: Mucous membranes are dry.  Eyes:     Extraocular Movements: Extraocular movements intact.  Cardiovascular:     Rate and Rhythm: Normal rate and regular rhythm.  Pulmonary:     Effort: Pulmonary effort is normal.     Breath sounds: Normal breath sounds.     Comments: Respirations slower Abdominal:     General: Bowel sounds are normal. There is no distension.     Palpations: Abdomen is soft.     Tenderness: There is no abdominal tenderness.  Musculoskeletal:        General: Normal range of motion.     Cervical back: Normal range of motion and neck supple.  Skin:    General: Skin is warm and dry.  Neurological:     Comments: No movements spontaneously      Consultants:  Palliative care   Procedures:    Data Reviewed: I have Reviewed  nursing notes, Vitals, and Lab results since pt's last encounter. Pertinent lab results n/a   LOS: 20 days   Dwyane Dee, MD [facility] 04/20/2021, 2:40 PM

## 2021-04-20 NOTE — Progress Notes (Signed)
Manufacturing engineer [facility]) Hospital Liaison Note   Per documentation, patient is not stable for transport and is actively passing and would likely pass in transport. No further communication will be made to family in regards to transfer unless advised otherwise by hospital IDT.    Please do not hesitate to call with any hospice related questions.   Thank you,   Farrel Gordon, RN, [facility] Liaison   [phone]

## 2021-04-21 DIAGNOSIS — R6520 Severe sepsis without septic shock: Secondary | ICD-10-CM | POA: Diagnosis not present

## 2021-04-21 DIAGNOSIS — A419 Sepsis, unspecified organism: Secondary | ICD-10-CM | POA: Diagnosis not present

## 2021-04-21 DIAGNOSIS — N179 Acute kidney failure, unspecified: Secondary | ICD-10-CM | POA: Diagnosis not present

## 2021-04-28 ENCOUNTER — Telehealth: Payer: PPO

## 2021-05-12 ENCOUNTER — Ambulatory Visit: Payer: PPO | Admitting: Neurology

## 2021-05-12 NOTE — Progress Notes (Signed)
Pt passed at 1135. Time of death pronounced by Candie Mile, RN and Theora Gianotti, RN.  MD notified. Family notified. Post mortem care completed. Morphine drip, 55 cc,  wasted in med room steri-cycle with Brien Mates.

## 2021-05-12 NOTE — Progress Notes (Signed)
Patient had expired when I arrived to his bedside.Staff reported  that he was found pulseless and not breathing just prior to my visit. RN pronounced and notified family. Will offer support and bereavement call in follow up.  Lane Hacker, DO Palliative Medicine

## 2021-05-12 NOTE — Progress Notes (Signed)
Morphine drip-wasted 55cc from bag into med room steri-cycle, witnessed by [facility].Khamani Fairley, Laurel Dimmer, RN

## 2021-05-12 NOTE — Death Summary Note (Signed)
DEATH SUMMARY   Patient Details  Name: Erik Lara MRN: [id number] DOB: December 18, 1935 AST:[id number], Tommi Rumps, NP  Admission/Discharge Information   Admit Date:  04/15/2021  Date of Death: Date of Death: May 06, 2021  Time of Death: Time of Death: 90  Length of Stay: 06/17/2022   Principle Cause of death: Sepsis  Hospital Diagnoses: Principal Problem:   Sepsis ([place]) Active Problems:   Hyperlipidemia   Type 2 diabetes mellitus without complication, without long-term current use of insulin ([place])   Lewy body dementia ([place])   COVID-19 virus infection   Pressure injury of skin   Abnormal TSH   Acute metabolic encephalopathy   Decubitus ulcer of sacral region, unstageable ([place])   Acute urinary retention   Hydronephrosis   Protein-calorie malnutrition, severe   Goals of care, counseling/discussion   Hospital Course: Erik Lara is 86 y.o. male with a history of hyperlipidemia, lewy body dementia, diabetes mellitus, sacral pressure injury. Patient presented secondary to altered mental status and shortness of breath found to have evidence of sepsis secondary to a sacral decubitus ulcer. Blood cultures significant for bacteroides thetaiotaomicron. General surgery on board for debridement of decubitus ulcer. Patient is now transitioned to comfort measures.  Assessment and Plan: * Sepsis ([place])- (present on admission) Present on admission. Likely secondary to sacral decubitus ulcer. Procalcitonin of 0.31. Afebrile with leukocytosis. Antibiotics initiated on admission and blood cultures obtained. One set of blood cultures significant for Bacteroides Thetaiotaomicron. Leukocytosis improved with change in antibiotics.  - Patient now transitioned to comfort measures. - per palliative care, not considered stable for discharge to residential hospice; recommendation is to remain inpatient for anticipated in-hospital death  Goals of care, counseling/discussion While at SNF, patient was referred to  hospice; this was apparently not known by family at the time and they have not accepted hospice care. Patient is currently unable to eat/drink secondary to severe encephalopathy. Patient also shows signs of dehydration complicated by poor albumin/third spacing.  1/23: Transition to full comfort measures.  2/1: Recommended to remain inpatient per palliative care for in-hospital death  Protein-calorie malnutrition, severe Dietitian consulted. Patient is currently NPO secondary to mental status.  Hydronephrosis Likely related to acute urinary retention. Left greater than right. Associated AKI on admission.  Acute urinary retention Unknown etiology. Bladder scan in the ED was significant for 1200 mL of measured urine. Foley catheter placed. Urine culture with no growth. Associated left moderate and right mild hydronephrosis. -Continue foley catheter for comfort  Decubitus ulcer of sacral region, unstageable [facility]) Concern that this is a source for infection/sepsis. General surgery consulted. CT pelvis significant for no abscess formation. Started empirically on Vancomycin, Cefepime and Flagyl. Patient developed worsening leukocytosis and continued fevers with subsequent change to meropenem IV. Now transitioned to comfort measures.  Acute metabolic encephalopathy Possibly multifactorial in setting of underlying dementia, hypernatremia, acute infection. CT head unremarkable for acute process and EEG significant for evidence of diffuse cerebral dysfunction but no evidence of seizure activity.  Abnormal TSH- (present on admission) Recent TSH from 12/18 of 0.143 with free T4 of 1.39 (elevated) and T3 of 57 (low). Concerning for hyperthyroidism. Repeat TSH and free T4 are normal. Comfort measures.  Pressure injury of skin- (present on admission) Left pretibial, left heel, right heel, medial sacrum, anterior/left ankle, anterior/left knee, posterior/right elbow.  COVID-19 virus infection- (present on  admission) Previously diagnosed on 02/26/2021. Currently asymptomatic. No treatment initiated this admission. Chest x-ray without acute process.  Lewy body dementia ([place])- (present on admission)  Patient follows with neurology. Hold Namenda and Aricept  Type 2 diabetes mellitus without complication, without long-term current use of insulin (HCC) Well controlled. Last hemoglobin A1C of 5.7% from 12/22.  Hyperlipidemia- (present on admission) Hold home Crestor  AKI (acute kidney injury) (HCC)-resolved as of 04/03/2021 Creatinine of 2.42 on admission. Secondary to acute urinary retention in addition to decreased oral intake. Foley catheter placed and IV fluids given with improved creatinine.  Hypernatremia-resolved as of 04/05/2021 Unsure of chronicity. Secondary to poor oral intake. Initial worsening with peak of 152, now resolved with IV fluids. Comfort measures.      The results of significant diagnostics from this hospitalization (including imaging, microbiology, ancillary and laboratory) are listed below for reference.   Significant Diagnostic Studies: CT HEAD WO CONTRAST (5MM)  Result Date: 04/02/2021 CLINICAL DATA:  Altered mental status, COVID positive EXAM: CT HEAD WITHOUT CONTRAST TECHNIQUE: Contiguous axial images were obtained from the base of the skull through the vertex without intravenous contrast. RADIATION DOSE REDUCTION: This exam was performed according to the departmental dose-optimization program which includes automated exposure control, adjustment of the mA and/or kV according to patient size and/or use of iterative reconstruction technique. COMPARISON:  None. FINDINGS: Brain: Normal anatomic configuration. Moderate parenchymal volume loss is commensurate with the patient's age. Mild periventricular white matter changes are present likely reflecting the sequela of small vessel ischemia. No abnormal intra or extra-axial mass lesion or fluid collection. No abnormal mass  effect or midline shift. No evidence of acute intracranial hemorrhage or infarct. Ventricular size is normal. Cerebellum unremarkable. Vascular: No asymmetric hyperdense vasculature at the skull base. Skull: Intact Sinuses/Orbits: Paranasal sinuses are clear. Orbits are unremarkable. Other: Mastoid air cells and middle ear cavities are clear. IMPRESSION: No acute intracranial abnormality.  Moderate senescent change Electronically Signed   By: Fidela Salisbury M.D.   On: 04/02/2021 19:38   CT CHEST WO CONTRAST  Result Date: 04/04/2021 CLINICAL DATA:  Possible sepsis.  Respiratory illness. EXAM: CT CHEST WITHOUT CONTRAST TECHNIQUE: Multidetector CT imaging of the chest was performed following the standard protocol without IV contrast. RADIATION DOSE REDUCTION: This exam was performed according to the departmental dose-optimization program which includes automated exposure control, adjustment of the mA and/or kV according to patient size and/or use of iterative reconstruction technique. COMPARISON:  Abdomen CT 02/22/2007. FINDINGS: Cardiovascular: The heart size is normal. No substantial pericardial effusion. Coronary artery calcification is evident. Mild atherosclerotic calcification is noted in the wall of the thoracic aorta. Mediastinum/Nodes: No mediastinal lymphadenopathy. No evidence for gross hilar lymphadenopathy although assessment is limited by the lack of intravenous contrast on the current study. The esophagus has normal imaging features. 2.6 cm exophytic nodule noted posterior left thyroid gland. There is no axillary lymphadenopathy. Lungs/Pleura: Patchy airspace disease in the posterior right lower lobe is compatible with pneumonia. There is some dependent atelectasis in both lower lungs. No pulmonary edema or substantial pleural effusion. Upper Abdomen: Innumerable large hepatic cysts are again noted, some with mural calcification. Findings are not substantially progressive when comparing to the remote  abdomen CT. Left renal cysts are evident. Musculoskeletal: No worrisome lytic or sclerotic osseous abnormality. Sclerotic lesion in the T10 vertebral body is new in the interval since 2008, but likely benign. IMPRESSION: 1. Patchy airspace disease in the posterior right lower lobe compatible with pneumonia. Consider follow-up CT chest in 3 months to ensure resolution. 2. 2.6 cm exophytic nodule posterior left thyroid gland. This has been evaluated on previous imaging. (ref: J Am Coll  Radiol. 2015 Feb;12(2): 143-50). 3. Hepatic and left renal cysts. 4. Aortic Atherosclerosis (ICD10-I70.0). Electronically Signed   By: Misty Stanley M.D.   On: 04/10/2021 15:07   CT PELVIS WO CONTRAST  Result Date: 04/01/2021 CLINICAL DATA:  Sacral decubitus ulcer. Osteomyelitis suspected. Possible sepsis. EXAM: CT PELVIS WITHOUT CONTRAST TECHNIQUE: Multidetector CT imaging of the pelvis was performed following the standard protocol without intravenous contrast. RADIATION DOSE REDUCTION: This exam was performed according to the departmental dose-optimization program which includes automated exposure control, adjustment of the mA and/or kV according to patient size and/or use of iterative reconstruction technique. COMPARISON:  Limited correlation made with one view abdomen 03/05/2021 and abdominal CT 03/05/2021. FINDINGS: Urinary Tract: The visualized distal ureters appear unremarkable. A Foley catheter is in place with air in the bladder lumen. There is generalized bladder wall thickening. Multiple small dependent calculi are present within the bladder lumen. Bowel: No bowel wall thickening, distention or surrounding inflammation identified within the pelvis. Mildly prominent rectal stool. Vascular/Lymphatic: No enlarged pelvic lymph nodes identified. Mild iliofemoral atherosclerosis. Reproductive: The prostate gland and seminal vesicles appear unremarkable. Other: No intrapelvic fluid collection or free air identified.  Musculoskeletal: There is soft tissue ulceration along the inferior aspect of the intergluteal fold, with soft tissue emphysema extending inferior to the coccyx, asymmetric to the left. There are inflammatory changes within the surrounding subcutaneous fat, although no foreign body is identified. There is no definite bone destruction to suggest osteomyelitis. There are mild degenerative changes of the hips and sacroiliac joints bilaterally. No significant joint effusions. Moderate facet arthropathy noted in the lower lumbar spine. IMPRESSION: 1. Soft tissue decubitus ulceration over the distal sacrum and coccyx with associated soft tissue emphysema suspicious for soft tissue infection. No drainable abscess identified. 2. No evidence of pelvic osteomyelitis. 3. Degenerative changes in the lower lumbar spine, sacroiliac joints and hips without significant joint effusions. 4. Multiple small bladder calculi with nonspecific bladder wall thickening. Foley catheter in place. Electronically Signed   By: Richardean Sale M.D.   On: 03/25/2021 15:13   US RENAL  Result Date: 04/01/2021 CLINICAL DATA:  AKI. EXAM: RENAL / URINARY TRACT ULTRASOUND COMPLETE COMPARISON:  02/22/2007. FINDINGS: Right Kidney: Renal measurements: 13.7 x 5.8 x 7.3 cm = volume: 503.6 mL. Echogenicity within normal limits. No renal calculus. There is mild hydronephrosis. Left Kidney: Renal measurements: 12.5 x 5.9 x 4.9 cm = volume: 188.8 mL. Echogenicity within normal limits. There is a cyst in the midpole measuring 2.3 x 1.8 x 1.8 cm with a thin rim calcification. A cyst is present in the upper pole measuring 2.1 x 2.2 x 2.3 cm. No renal calculi. Moderate hydronephrosis is noted. Bladder: The bladder is nondistended and a Foley catheter is in place. Other: Limited evaluation due to patient's body habitus and immobility. Incidental note of multiple cysts in the liver. IMPRESSION: 1. Moderate hydronephrosis on the left and mild hydronephrosis on the  right. 2. Left renal cysts, 1 with a thin rim calcification. 3. Foley catheter in the urinary bladder. Electronically Signed   By: Brett Fairy M.D.   On: 04/01/2021 02:11   DG Chest Port 1 View  Result Date: 03/19/2021 CLINICAL DATA:  Sepsis EXAM: PORTABLE CHEST 1 VIEW COMPARISON:  Chest x-ray 02/28/2021 FINDINGS: Heart size and mediastinal contours are within normal limits. No suspicious pulmonary opacities identified. No pleural effusion or pneumothorax visualized. No acute osseous abnormality appreciated. IMPRESSION: No acute intrathoracic process identified. Electronically Signed   By: Tiburcio Pea.D.  On: 03/30/2021 09:03   EEG adult  Result Date: 04/02/2021 Mickie Hillier, MD     04/02/2021  6:15 PM Routine EEG EEG Performed Date 04/02/2021 Duration: 23:51 Clinical Indication: suspected seizures EEG Procedure: This is a digitally recorded routine EEG electroencephalogram. The international 10-20 electrode placement system is used for scalp electrode placement. Eighteen channels of scalp EEG are recorded. The data are stored digitally and reviewed in reformatted montages for optimal display. EEG Description: This EEG is not well organized.   There is no clear posterior dominant rhythm.  The background consisted of a mixture of delta and theta frequencies primarily.  During drowsiness, background slowing increased.  Stage 2 sleep was not recorded. There were no definite focal abnormalities, persistent asymmetries or epileptiform discharges.  Artifact:  Of note, there was significant EMG artifact related to patient's movement precluding full evaluation of underlying EEG rhythms. Activating procedures:  Photic stimulation was not performed.  Hyperventilation was not performed. EKG: The EKG rhythm strip demonstrated a NSR at 90 bpm. EEG Classification: Abnormal EEG Interpretation: Background slowing, generalized This routine EEG recorded in the awake and drowsy states is abnormal.  The severe  background slowing suggest severe diffuse cerebral dysfunction and/or sedative effect.  Please correlate clinically.  There were no definite focal abnormalities, persistent asymmetries or epileptiform discharges.     Microbiology: No results found for this or any previous visit (from the past 240 hour(s)).  Time spent: 40 minutes  Signed: Phillips Grout, MD 2021/05/12

## 2021-05-12 DEATH — deceased

## 2021-08-20 IMAGING — DX DG TOE GREAT 2+V*R*
3 series · 3 of 3 positions shown · non-contrast
Comparison: None.

CLINICAL DATA: 84-year-old male with a history of toe injury

EXAM:
RIGHT GREAT TOE

[toe ap]
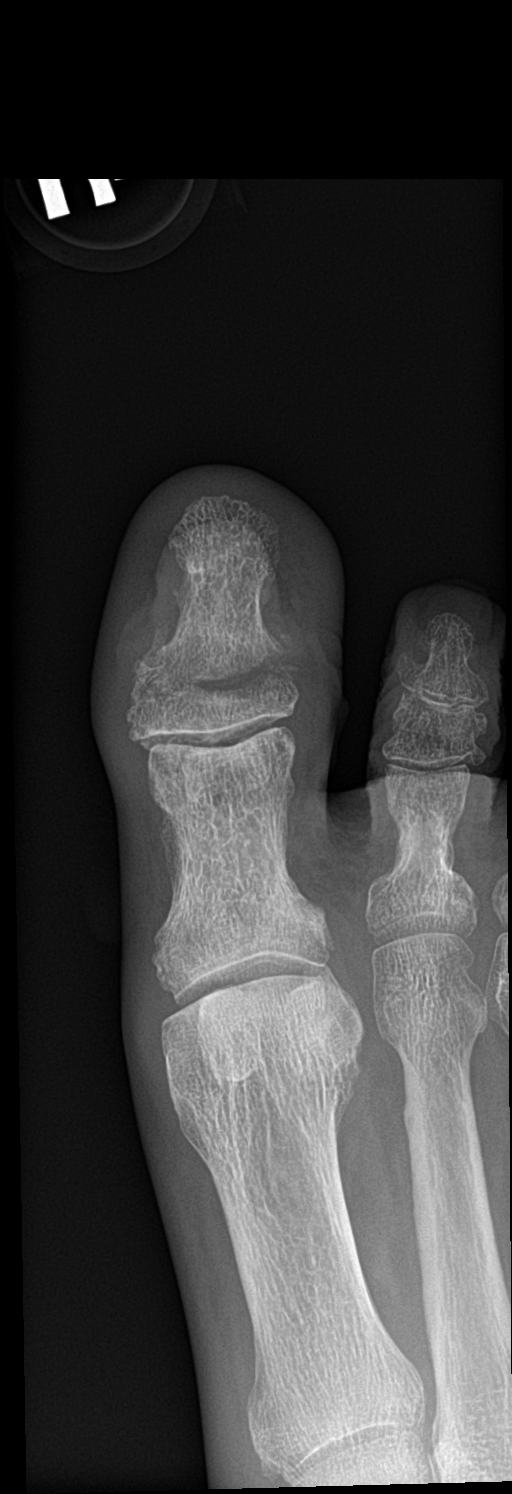

[toe obl]
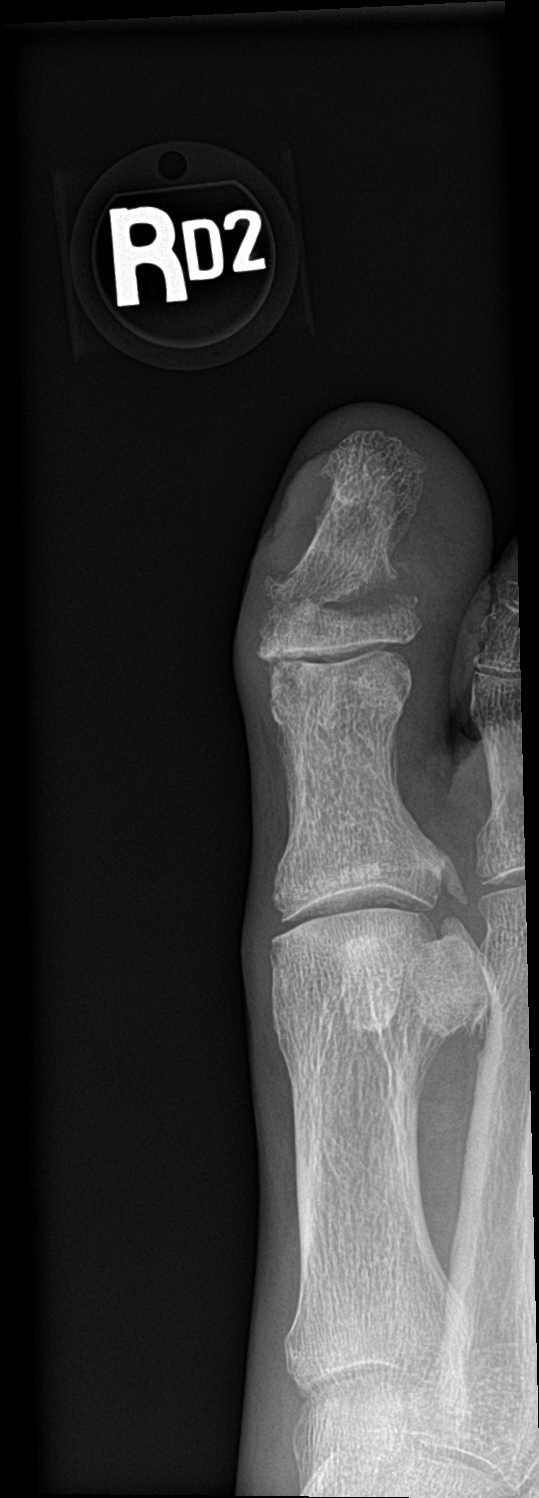

[toe lat]
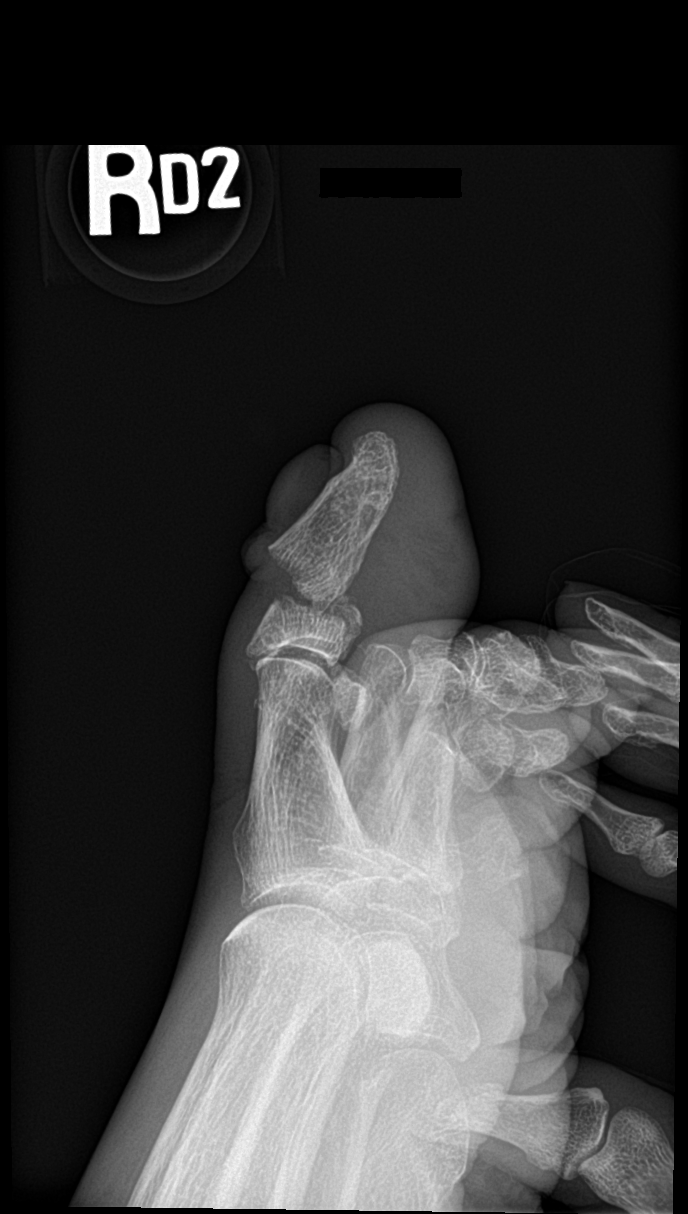

[3 of 3 positions shown; findings below may reference images not displayed]

FINDINGS: Acute transverse fracture at the base of the distal phalanx of the
great toe right foot with approximately 25 degrees of dorsal
angulation at the fracture site. Soft tissue swelling. Soft tissue
disruption at the base of the nail bed.
IMPRESSION: Acute transverse open fracture involving the base of the distal
phalanx of the right great toe with approximately 25 degrees of
dorsal angulation.

## 2022-10-02 IMAGING — CT CT CERVICAL SPINE W/O CM
3 of 4 series · 9 of 33 positions shown, 11 images · non-contrast
Comparison: Head MRI 09/13/[address] head and cervical spine
09/18/2017.

CLINICAL DATA: Mental status change. Recent falls. New
incontinence.

EXAM:
CT HEAD WITHOUT CONTRAST
CT CERVICAL SPINE WITHOUT CONTRAST
TECHNIQUE: Multidetector CT imaging of the head and cervical spine was
performed following the standard protocol without intravenous
contrast. Multiplanar CT image reconstructions of the cervical spine
were also generated.

[Series 6: orthogonal bone upper · axial · 0.30mm/px · z∈[-270,-270]mm · 1 of 134 slices shown, 2 images]
[im 77/134  soft-tissue]
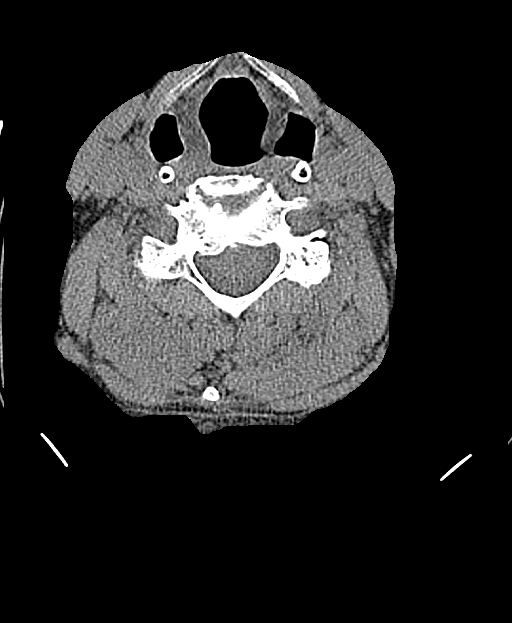
[im 77/134  bone]
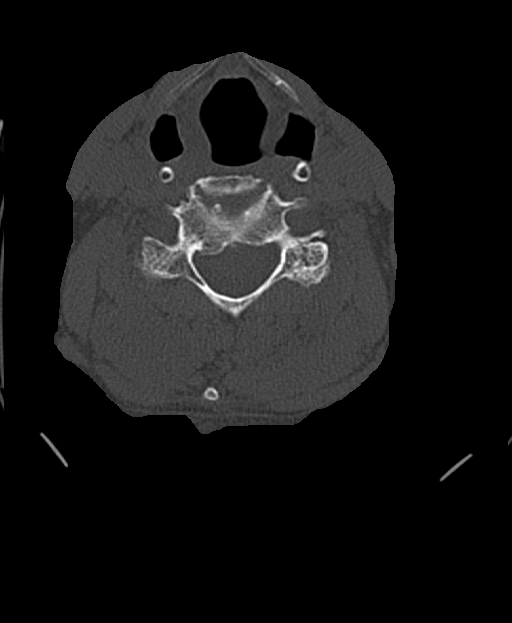

[Series 8: coronal bone · coronal · 0.27mm/px · 3 of 64 slices shown]
[im 19/64  bone]
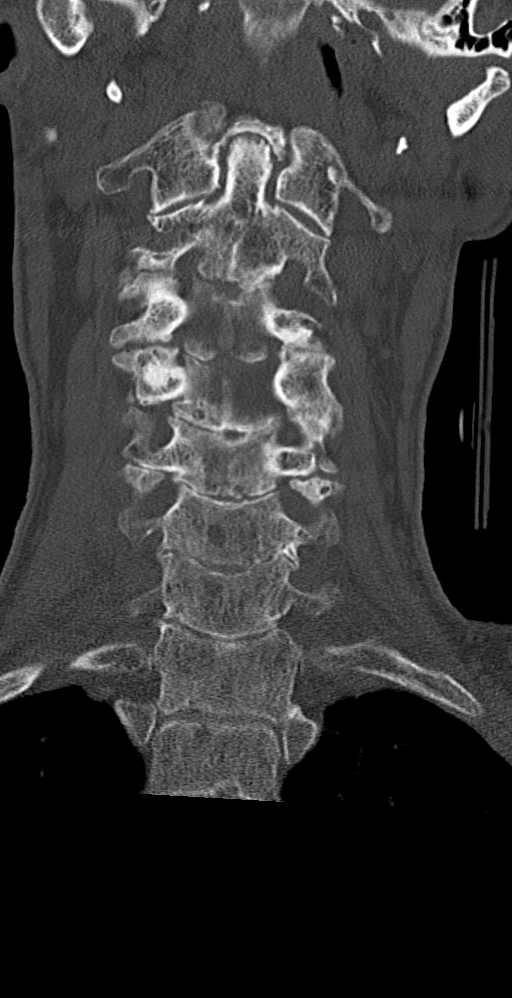
[im 28/64  bone]
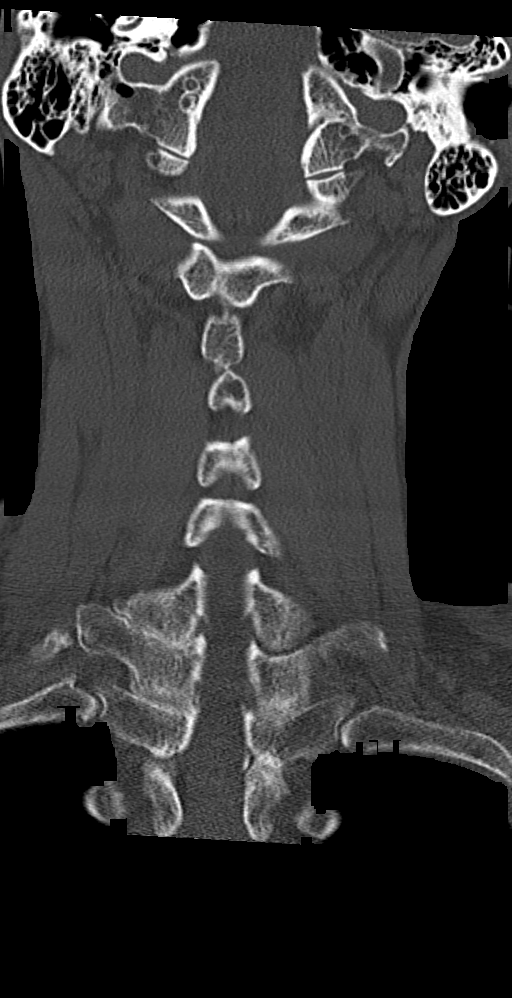
[im 37/64  bone]
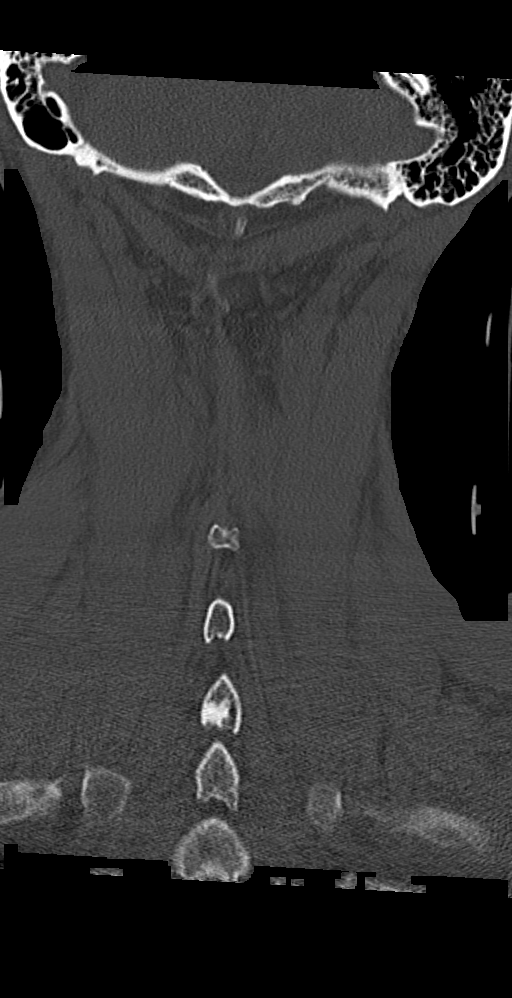

[Series 9: sagittal bone · sagittal · 0.30mm/px · 5 of 61 slices shown, 6 images]
[im 21/61  bone]
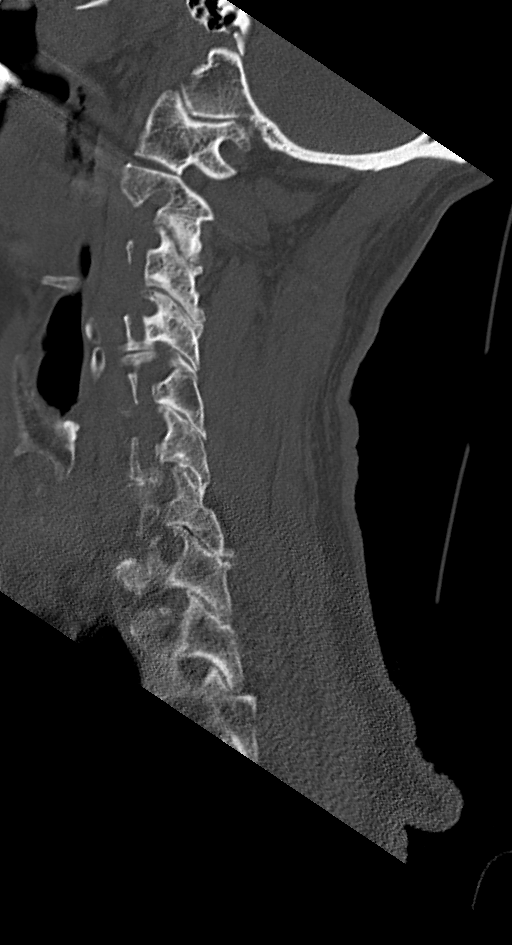
[im 26/61  bone]
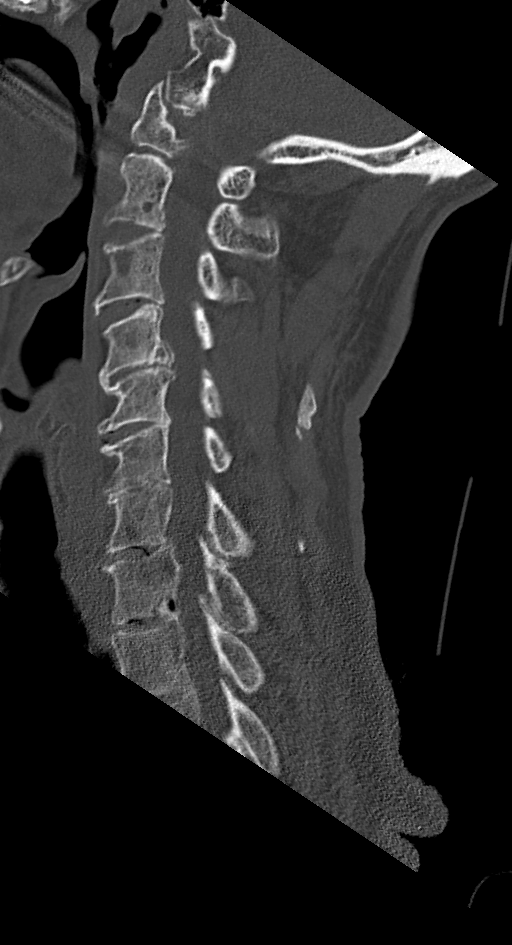
[im 31/61  soft-tissue]
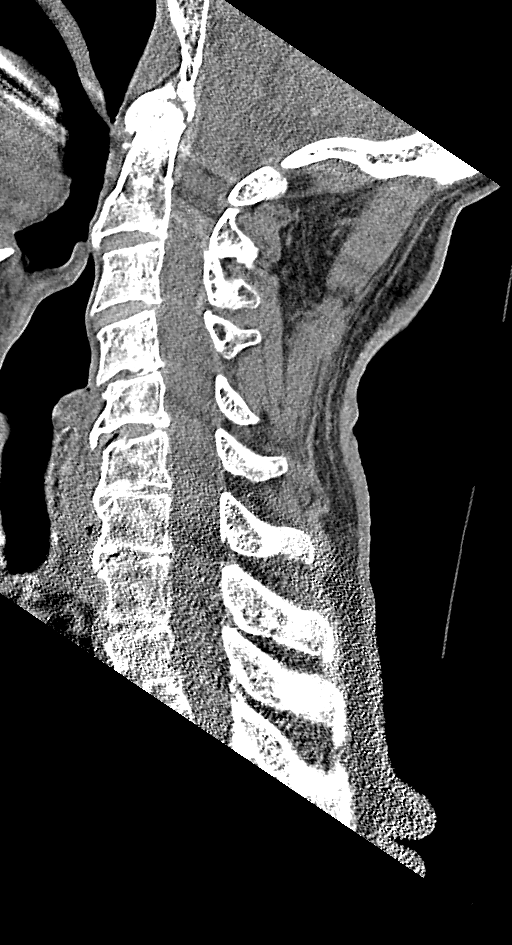
[im 31/61  bone]
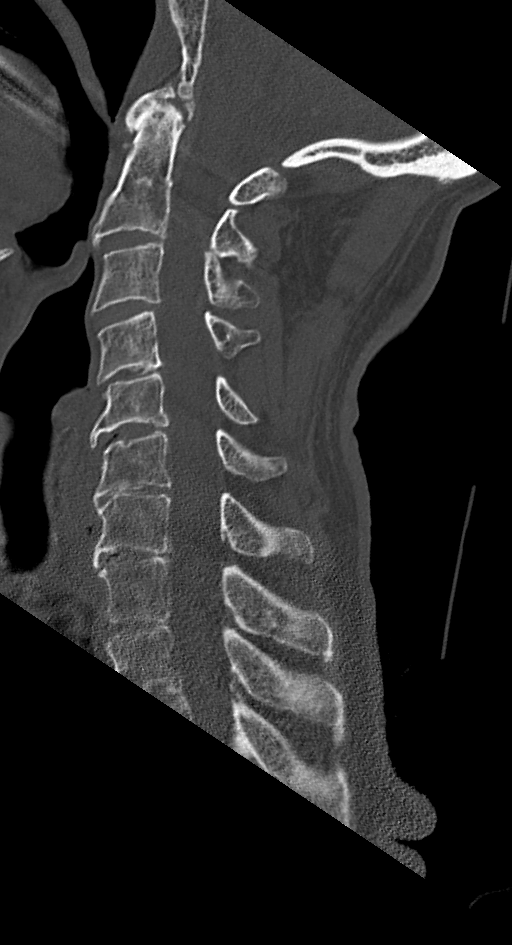
[im 36/61  bone]
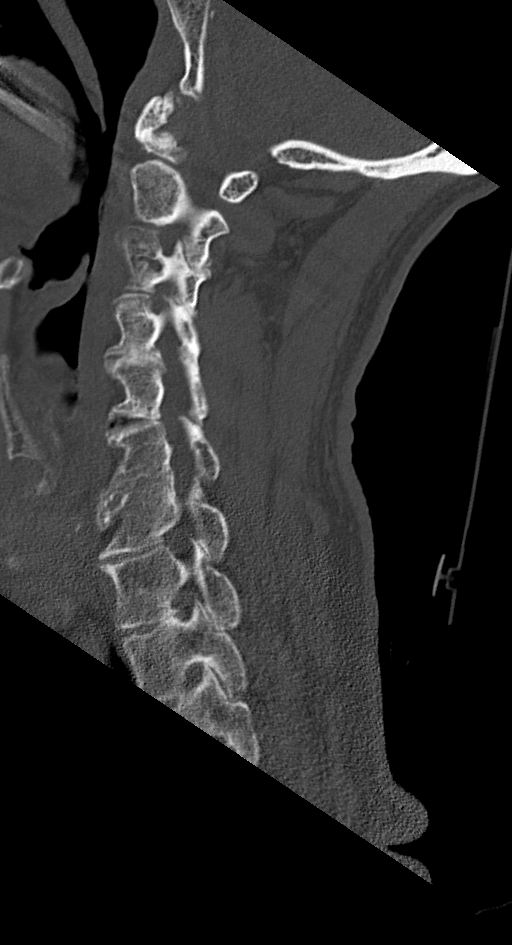
[im 41/61  bone]
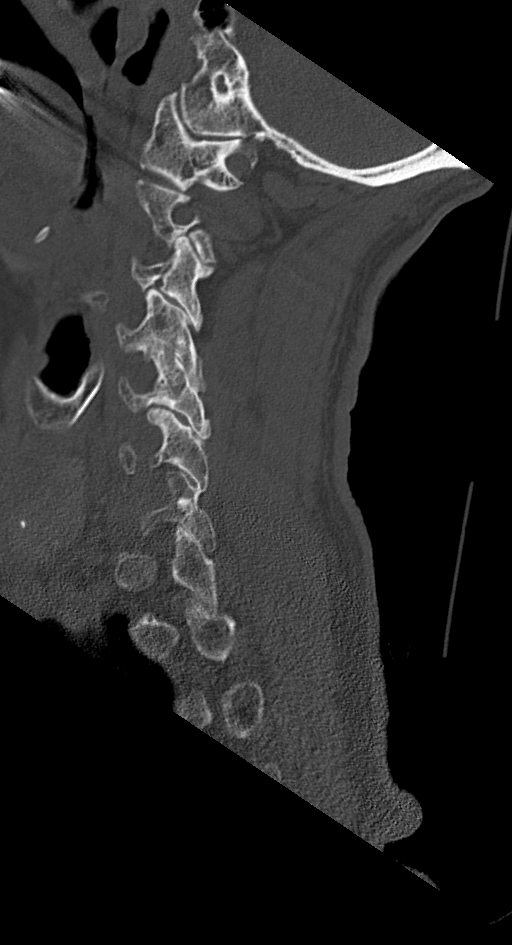

[9 of 33 positions shown; findings below may reference images not displayed]

FINDINGS: CT HEAD FINDINGS

Brain: There is no evidence of an acute infarct, intracranial
hemorrhage, mass, midline shift, or extra-axial fluid collection.
Generalized cerebral atrophy is mild for age.

Vascular: Calcified atherosclerosis at the skull base. No hyperdense
vessel.

Skull: No fracture or suspicious osseous lesion.

Sinuses/Orbits: Paranasal sinuses and mastoid air cells are clear.
Unremarkable orbits.

Other: None.

CT CERVICAL SPINE FINDINGS

Alignment: Straightening of the normal cervical lordosis. No
listhesis.

Skull base and vertebrae: No acute fracture. Scattered small
sclerotic foci in the cervical and included upper thoracic spine,
chronic and largely unchanged although a focus in the left C7
transverse process has mildly enlarged. Given the stability of most
islands is strongly favored over metastatic disease.

Soft tissues and spinal canal: No prevertebral fluid or swelling. No
visible canal hematoma.

Disc levels: Moderate cervical disc and facet degeneration without
evidence of high-grade spinal canal stenosis. Uncovertebral and
facet spurring results in multilevel neural foraminal stenosis,
severe on the right at C3-4.

Upper chest: Clear lung apices.

Other: None.
IMPRESSION: 1. No evidence of acute intracranial abnormality.
2. No evidence of acute fracture or subluxation in the cervical
spine.

## 2022-10-04 IMAGING — DX DG CHEST 1V PORT
1 series · 1 of 1 positions shown · non-contrast
Comparison: Chest x-ray 02/26/2021.

CLINICAL DATA: 85-year-old male with history of altered mental
status.

EXAM:
PORTABLE CHEST 1 VIEW

[chest ap]
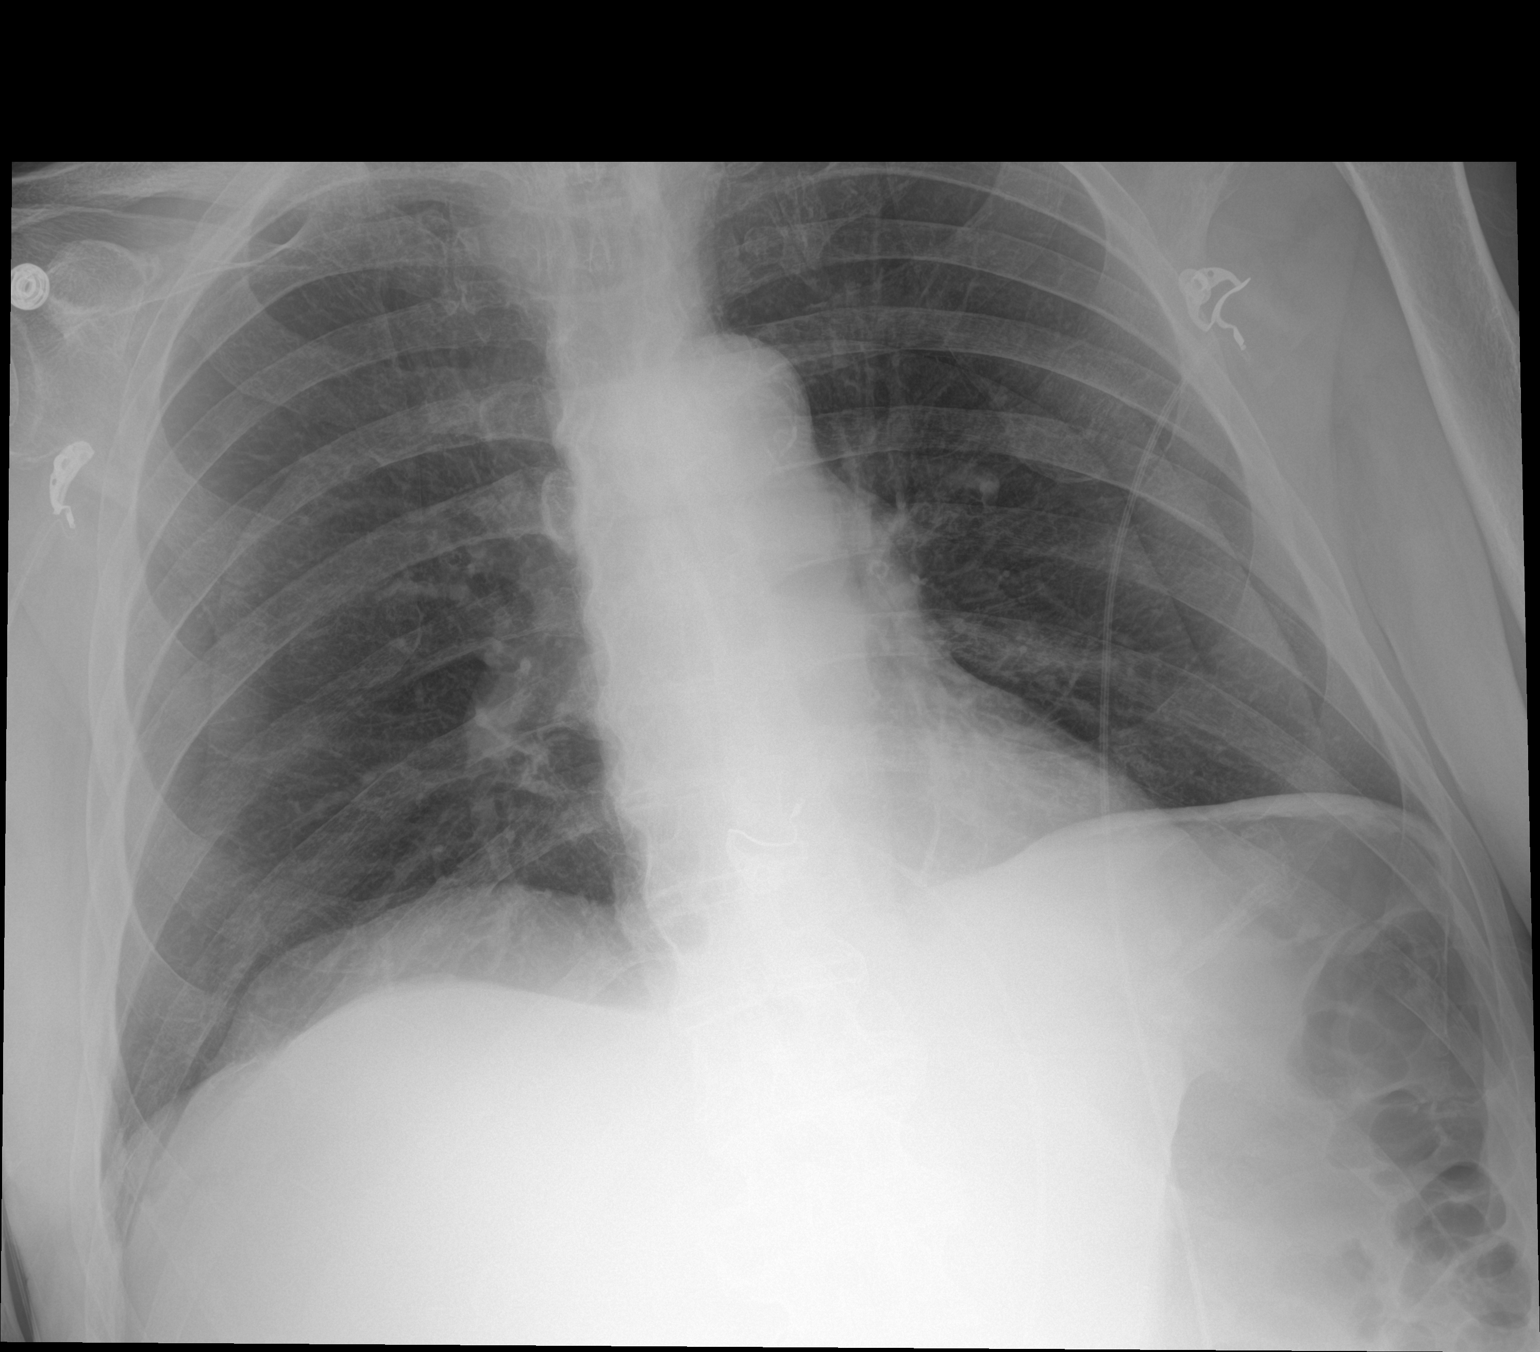

[1 of 1 positions shown; findings below may reference images not displayed]

FINDINGS: Skin fold artifact projecting over the thorax bilaterally. Lung
volumes are low. No consolidative airspace disease. No pleural
effusions. No pneumothorax. No pulmonary nodule or mass noted.
Pulmonary vasculature and the cardiomediastinal silhouette are
within normal limits. Atherosclerotic calcifications are noted in
the thoracic aorta.
IMPRESSION: 1. Low lung volumes without radiographic evidence of acute
cardiopulmonary disease.
2. Aortic atherosclerosis.
